# Patient Record
Sex: Female | Born: 1965 | Race: Black or African American | Hispanic: No | Marital: Single | Smoking: Current every day smoker
Health system: Southern US, Community
[De-identification: ages and names within clinical notes are randomized; demographics above are authoritative.]

## PROBLEM LIST (undated history)

## (undated) ENCOUNTER — Inpatient Hospital Stay (HOSPITAL_COMMUNITY): Payer: Self-pay

## (undated) ENCOUNTER — Emergency Department (HOSPITAL_COMMUNITY): Disposition: A | Discharge status: Home / Self Care | Payer: Self-pay

## (undated) DIAGNOSIS — I351 Nonrheumatic aortic (valve) insufficiency: Secondary | ICD-10-CM

## (undated) DIAGNOSIS — M1990 Unspecified osteoarthritis, unspecified site: Secondary | ICD-10-CM

## (undated) DIAGNOSIS — I110 Hypertensive heart disease with heart failure: Secondary | ICD-10-CM

## (undated) DIAGNOSIS — F1010 Alcohol abuse, uncomplicated: Secondary | ICD-10-CM

## (undated) DIAGNOSIS — Z720 Tobacco use: Secondary | ICD-10-CM

## (undated) DIAGNOSIS — I340 Nonrheumatic mitral (valve) insufficiency: Secondary | ICD-10-CM

## (undated) DIAGNOSIS — I429 Cardiomyopathy, unspecified: Secondary | ICD-10-CM

## (undated) DIAGNOSIS — I10 Essential (primary) hypertension: Secondary | ICD-10-CM

## (undated) DIAGNOSIS — J449 Chronic obstructive pulmonary disease, unspecified: Secondary | ICD-10-CM

## (undated) DIAGNOSIS — R0600 Dyspnea, unspecified: Secondary | ICD-10-CM

## (undated) DIAGNOSIS — F1210 Cannabis abuse, uncomplicated: Secondary | ICD-10-CM

## (undated) DIAGNOSIS — M549 Dorsalgia, unspecified: Secondary | ICD-10-CM

## (undated) HISTORY — PX: TUBAL LIGATION: SHX77

## (undated) HISTORY — PX: TONSILLECTOMY: SUR1361

---

## 1999-05-18 ENCOUNTER — Emergency Department (HOSPITAL_COMMUNITY): Admission: EM | Admit: 1999-05-18 | Discharge: 1999-05-18 | Payer: Self-pay | Admitting: *Deleted

## 1999-07-02 ENCOUNTER — Emergency Department (HOSPITAL_COMMUNITY): Admission: EM | Admit: 1999-07-02 | Discharge: 1999-07-02 | Payer: Self-pay | Admitting: Emergency Medicine

## 1999-11-14 ENCOUNTER — Other Ambulatory Visit: Admission: RE | Admit: 1999-11-14 | Discharge: 1999-11-14 | Payer: Self-pay | Admitting: Obstetrics

## 2000-08-22 ENCOUNTER — Emergency Department (HOSPITAL_COMMUNITY): Admission: EM | Admit: 2000-08-22 | Discharge: 2000-08-22 | Payer: Self-pay | Admitting: Emergency Medicine

## 2001-04-23 ENCOUNTER — Encounter: Payer: Self-pay | Admitting: Family Medicine

## 2001-04-23 ENCOUNTER — Encounter: Admission: RE | Admit: 2001-04-23 | Discharge: 2001-04-23 | Payer: Self-pay | Admitting: Family Medicine

## 2001-07-14 HISTORY — PX: EXPLORATORY LAPAROTOMY: SUR591

## 2002-04-25 ENCOUNTER — Encounter: Payer: Self-pay | Admitting: Emergency Medicine

## 2002-04-25 ENCOUNTER — Emergency Department (HOSPITAL_COMMUNITY): Admission: EM | Admit: 2002-04-25 | Discharge: 2002-04-25 | Payer: Self-pay | Admitting: Emergency Medicine

## 2002-07-22 ENCOUNTER — Encounter: Payer: Self-pay | Admitting: Emergency Medicine

## 2002-07-22 ENCOUNTER — Emergency Department (HOSPITAL_COMMUNITY): Admission: EM | Admit: 2002-07-22 | Discharge: 2002-07-22 | Payer: Self-pay | Admitting: Emergency Medicine

## 2003-04-05 ENCOUNTER — Encounter: Admission: RE | Admit: 2003-04-05 | Discharge: 2003-04-05 | Payer: Self-pay | Admitting: Internal Medicine

## 2003-08-11 ENCOUNTER — Encounter: Admission: RE | Admit: 2003-08-11 | Discharge: 2003-08-11 | Payer: Self-pay | Admitting: Sports Medicine

## 2003-08-11 ENCOUNTER — Other Ambulatory Visit: Admission: RE | Admit: 2003-08-11 | Discharge: 2003-08-11 | Payer: Self-pay | Admitting: Family Medicine

## 2003-08-24 ENCOUNTER — Encounter: Admission: RE | Admit: 2003-08-24 | Discharge: 2003-08-24 | Payer: Self-pay | Admitting: Sports Medicine

## 2003-09-07 ENCOUNTER — Encounter: Admission: RE | Admit: 2003-09-07 | Discharge: 2003-09-07 | Payer: Self-pay | Admitting: Family Medicine

## 2004-01-08 ENCOUNTER — Encounter: Admission: RE | Admit: 2004-01-08 | Discharge: 2004-01-08 | Payer: Self-pay | Admitting: Sports Medicine

## 2004-03-11 ENCOUNTER — Encounter: Admission: RE | Admit: 2004-03-11 | Discharge: 2004-03-11 | Payer: Self-pay | Admitting: Family Medicine

## 2004-06-14 ENCOUNTER — Ambulatory Visit: Payer: Self-pay | Admitting: *Deleted

## 2005-03-18 ENCOUNTER — Emergency Department (HOSPITAL_COMMUNITY): Admission: EM | Admit: 2005-03-18 | Discharge: 2005-03-18 | Payer: Self-pay | Admitting: Emergency Medicine

## 2005-07-14 ENCOUNTER — Encounter (INDEPENDENT_AMBULATORY_CARE_PROVIDER_SITE_OTHER): Payer: Self-pay | Admitting: *Deleted

## 2005-07-14 LAB — CONVERTED CEMR LAB

## 2005-07-30 ENCOUNTER — Other Ambulatory Visit: Admission: RE | Admit: 2005-07-30 | Discharge: 2005-07-30 | Payer: Self-pay | Admitting: *Deleted

## 2005-07-30 ENCOUNTER — Ambulatory Visit: Payer: Self-pay | Admitting: Family Medicine

## 2005-07-30 ENCOUNTER — Ambulatory Visit (HOSPITAL_COMMUNITY): Admission: RE | Admit: 2005-07-30 | Discharge: 2005-07-30 | Payer: Self-pay | Admitting: Family Medicine

## 2005-09-22 ENCOUNTER — Ambulatory Visit: Payer: Self-pay | Admitting: Family Medicine

## 2005-09-24 ENCOUNTER — Ambulatory Visit: Payer: Self-pay | Admitting: Family Medicine

## 2005-10-24 ENCOUNTER — Ambulatory Visit: Payer: Self-pay | Admitting: Family Medicine

## 2005-11-12 ENCOUNTER — Ambulatory Visit: Payer: Self-pay | Admitting: Family Medicine

## 2006-09-10 DIAGNOSIS — F17200 Nicotine dependence, unspecified, uncomplicated: Secondary | ICD-10-CM | POA: Insufficient documentation

## 2006-09-10 DIAGNOSIS — K219 Gastro-esophageal reflux disease without esophagitis: Secondary | ICD-10-CM | POA: Insufficient documentation

## 2006-09-11 ENCOUNTER — Encounter (INDEPENDENT_AMBULATORY_CARE_PROVIDER_SITE_OTHER): Payer: Self-pay | Admitting: *Deleted

## 2006-11-02 ENCOUNTER — Ambulatory Visit: Payer: Self-pay | Admitting: Family Medicine

## 2006-11-04 ENCOUNTER — Ambulatory Visit: Payer: Self-pay | Admitting: Sports Medicine

## 2007-10-07 ENCOUNTER — Encounter (INDEPENDENT_AMBULATORY_CARE_PROVIDER_SITE_OTHER): Payer: Self-pay | Admitting: Family Medicine

## 2007-11-12 ENCOUNTER — Ambulatory Visit: Payer: Self-pay | Admitting: Family Medicine

## 2007-11-12 ENCOUNTER — Encounter (INDEPENDENT_AMBULATORY_CARE_PROVIDER_SITE_OTHER): Payer: Self-pay | Admitting: Family Medicine

## 2007-11-12 DIAGNOSIS — N938 Other specified abnormal uterine and vaginal bleeding: Secondary | ICD-10-CM | POA: Insufficient documentation

## 2007-11-12 DIAGNOSIS — N949 Unspecified condition associated with female genital organs and menstrual cycle: Secondary | ICD-10-CM

## 2007-11-12 HISTORY — DX: Other specified abnormal uterine and vaginal bleeding: N93.8

## 2007-11-12 LAB — CONVERTED CEMR LAB: Whiff Test: POSITIVE

## 2007-11-15 ENCOUNTER — Ambulatory Visit: Payer: Self-pay | Admitting: Family Medicine

## 2007-11-15 LAB — CONVERTED CEMR LAB
ALT: 16 units/L (ref 0–35)
AST: 16 units/L (ref 0–37)
BUN: 10 mg/dL (ref 6–23)
CO2: 25 meq/L (ref 19–32)
Calcium: 8.7 mg/dL (ref 8.4–10.5)
Chlamydia, DNA Probe: NEGATIVE
Creatinine, Ser: 0.93 mg/dL (ref 0.40–1.20)
GC Probe Amp, Genital: NEGATIVE
Glucose, Bld: 88 mg/dL (ref 70–99)
HCT: 34.4 % — ABNORMAL LOW (ref 36.0–46.0)
HCV Ab: NEGATIVE
HDL: 44 mg/dL (ref >39)
MCHC: 32.3 g/dL (ref 30.0–36.0)
Platelets: 283 10*3/uL (ref 150–400)
TSH: 1.236 microintl units/mL (ref 0.350–5.50)
Total CHOL/HDL Ratio: 4
Total Protein: 7.3 g/dL (ref 6.0–8.3)

## 2007-11-16 ENCOUNTER — Other Ambulatory Visit: Admission: RE | Admit: 2007-11-16 | Discharge: 2007-11-16 | Payer: Self-pay | Admitting: Family Medicine

## 2007-11-17 ENCOUNTER — Ambulatory Visit (HOSPITAL_COMMUNITY): Admission: RE | Admit: 2007-11-17 | Discharge: 2007-11-17 | Payer: Self-pay | Admitting: Family Medicine

## 2007-11-18 ENCOUNTER — Encounter (INDEPENDENT_AMBULATORY_CARE_PROVIDER_SITE_OTHER): Payer: Self-pay | Admitting: Family Medicine

## 2008-03-17 ENCOUNTER — Telehealth: Payer: Self-pay | Admitting: *Deleted

## 2008-03-23 ENCOUNTER — Ambulatory Visit: Payer: Self-pay | Admitting: Family Medicine

## 2008-03-23 ENCOUNTER — Encounter: Payer: Self-pay | Admitting: Family Medicine

## 2008-03-24 LAB — CONVERTED CEMR LAB: GC Probe Amp, Genital: NEGATIVE

## 2008-05-16 ENCOUNTER — Telehealth: Payer: Self-pay | Admitting: *Deleted

## 2008-05-16 ENCOUNTER — Emergency Department (HOSPITAL_COMMUNITY): Admission: EM | Admit: 2008-05-16 | Discharge: 2008-05-16 | Payer: Self-pay | Admitting: Emergency Medicine

## 2008-05-31 ENCOUNTER — Telehealth: Payer: Self-pay | Admitting: *Deleted

## 2008-06-01 ENCOUNTER — Telehealth (INDEPENDENT_AMBULATORY_CARE_PROVIDER_SITE_OTHER): Payer: Self-pay | Admitting: Family Medicine

## 2008-12-14 ENCOUNTER — Encounter (INDEPENDENT_AMBULATORY_CARE_PROVIDER_SITE_OTHER): Payer: Self-pay | Admitting: Family Medicine

## 2008-12-14 ENCOUNTER — Ambulatory Visit: Payer: Self-pay | Admitting: Family Medicine

## 2008-12-14 DIAGNOSIS — F329 Major depressive disorder, single episode, unspecified: Secondary | ICD-10-CM

## 2008-12-14 DIAGNOSIS — R110 Nausea: Secondary | ICD-10-CM

## 2008-12-14 DIAGNOSIS — N920 Excessive and frequent menstruation with regular cycle: Secondary | ICD-10-CM

## 2008-12-14 DIAGNOSIS — R030 Elevated blood-pressure reading, without diagnosis of hypertension: Secondary | ICD-10-CM | POA: Insufficient documentation

## 2008-12-14 DIAGNOSIS — F3289 Other specified depressive episodes: Secondary | ICD-10-CM | POA: Insufficient documentation

## 2008-12-14 HISTORY — DX: Other specified depressive episodes: F32.89

## 2008-12-14 HISTORY — DX: Major depressive disorder, single episode, unspecified: F32.9

## 2008-12-14 HISTORY — DX: Excessive and frequent menstruation with regular cycle: N92.0

## 2008-12-14 LAB — CONVERTED CEMR LAB: Whiff Test: POSITIVE

## 2008-12-15 LAB — CONVERTED CEMR LAB
Albumin: 4.5 g/dL (ref 3.5–5.2)
BUN: 12 mg/dL (ref 6–23)
CO2: 23 meq/L (ref 19–32)
Creatinine, Ser: 0.85 mg/dL (ref 0.40–1.20)
Glucose, Bld: 94 mg/dL (ref 70–99)
Hemoglobin: 11.9 g/dL — ABNORMAL LOW (ref 12.0–15.0)
MCHC: 33.1 g/dL (ref 30.0–36.0)
Platelets: 270 10*3/uL (ref 150–400)
RBC: 4.25 M/uL (ref 3.87–5.11)
Sodium: 141 meq/L (ref 135–145)
Total Bilirubin: 0.3 mg/dL (ref 0.3–1.2)
Total Protein: 7.5 g/dL (ref 6.0–8.3)

## 2008-12-28 ENCOUNTER — Encounter: Admission: RE | Admit: 2008-12-28 | Discharge: 2008-12-28 | Payer: Self-pay | Admitting: Family Medicine

## 2009-01-03 ENCOUNTER — Encounter: Payer: Self-pay | Admitting: *Deleted

## 2009-01-04 ENCOUNTER — Encounter: Admission: RE | Admit: 2009-01-04 | Discharge: 2009-01-04 | Payer: Self-pay | Admitting: Family Medicine

## 2009-08-07 ENCOUNTER — Ambulatory Visit: Payer: Self-pay | Admitting: Family Medicine

## 2009-08-09 ENCOUNTER — Ambulatory Visit: Payer: Self-pay | Admitting: Family Medicine

## 2009-08-13 ENCOUNTER — Ambulatory Visit (HOSPITAL_COMMUNITY): Admission: RE | Admit: 2009-08-13 | Discharge: 2009-08-13 | Payer: Self-pay | Admitting: Family Medicine

## 2009-08-13 ENCOUNTER — Ambulatory Visit: Payer: Self-pay | Admitting: Family Medicine

## 2009-08-13 DIAGNOSIS — R079 Chest pain, unspecified: Secondary | ICD-10-CM | POA: Insufficient documentation

## 2009-08-17 ENCOUNTER — Encounter: Payer: Self-pay | Admitting: Family Medicine

## 2009-08-25 ENCOUNTER — Emergency Department (HOSPITAL_COMMUNITY): Admission: EM | Admit: 2009-08-25 | Discharge: 2009-08-25 | Payer: Self-pay | Admitting: Emergency Medicine

## 2009-08-28 ENCOUNTER — Telehealth: Payer: Self-pay | Admitting: Family Medicine

## 2009-09-10 ENCOUNTER — Ambulatory Visit: Payer: Self-pay | Admitting: Family Medicine

## 2009-09-10 ENCOUNTER — Telehealth: Payer: Self-pay | Admitting: Family Medicine

## 2009-09-10 DIAGNOSIS — N898 Other specified noninflammatory disorders of vagina: Secondary | ICD-10-CM | POA: Insufficient documentation

## 2009-09-27 ENCOUNTER — Encounter: Admission: RE | Admit: 2009-09-27 | Discharge: 2009-09-27 | Payer: Self-pay | Admitting: Chiropractic Medicine

## 2009-10-26 ENCOUNTER — Ambulatory Visit (HOSPITAL_COMMUNITY): Admission: RE | Admit: 2009-10-26 | Discharge: 2009-10-26 | Payer: Self-pay | Admitting: Chiropractic Medicine

## 2010-08-15 NOTE — Assessment & Plan Note (Signed)
Summary: FU&pap/KH   Vital Signs:  Patient profile:   45 year old female Weight:      170.8 pounds Temp:     98.5 degrees F oral Pulse rate:   82 / minute Pulse rhythm:   regular BP sitting:   125 / 83  (right arm)  Vitals Entered By: Loralee Pacas CMA (September 10, 2009 9:43 AM)  Primary Care Provider:  Magnus Ivan MD  CC:  vaginal discharge.  History of Present Illness: vaginal discharge Character: tan, creamy, odor (different than normal smell) Onset about 2.5 weeks nothing makes better nothing makes symptoms worse   Allergies (verified): 1)  ! Tramadol Hcl  Physical Exam  General:  NAD well-developed and well-nourished.   vital signs noted and wnl Genitalia:  white vaginal dischargenormal introitus, no external lesions, mucosa pink and moist, and no vaginal or cervix erythematous near os.normal introitus, no external lesions, mucosa pink and moist, no vaginal or cervical lesions, no vaginal atrophy, and no adnexal masses or tenderness.   Psych:  memory intact for recent and remote, normally interactive, good eye contact, not anxious appearing, and not depressed appearing.     Impression & Recommendations:  Problem # 1:  VAGINAL DISCHARGE (ICD-623.5) Assessment New Patient has vaginal discharge that she has noticed for the last couple of weeks.  On wet prep noticed Trichomonads.  Will write a prescription for metronidazole.  Also, on next visit will follow up with an attending for visualization of erythema near cervical os. Orders: Wet Prep- [facility] ([address]) [facility]- Est Level  3 ([address])  Complete Medication List: 1)  Omeprazole 20 Mg Cpdr (Omeprazole) .... Take one pill once a day 2)  Tramadol Hcl 50 Mg Tabs (Tramadol hcl) .... Take one tablet every 6 hours as needed for pain 3)  Ibuprofen 800 Mg Tabs (Ibuprofen) .... Take 1 tab every 6 hours as needed for pain  Patient Instructions: 1)  [name], thank you for your patience with me today!  I will look at the  results of your wet prep and give you a call with the results. 2)  Please call back and reschedule a full physical exam at your earliest convience! 3)  Thank you and be blessed!  Laboratory Results  Date/Time Received: September 10, 2009 10:21 AM  Date/Time Reported: September 10, 2009 10:33 AM   [employer] Source: vaginal WBC/hpf: 1-5 Bacteria/hpf: 3+ Clue cells/hpf: none Yeast/hpf: none Trichomonas/hpf: moderate Comments: ...........test performed by...........Marland KitchenTerese Door, CMA

## 2010-08-15 NOTE — Assessment & Plan Note (Signed)
Summary: KNOTT ON WRIST/KH   Vital Signs:  Patient profile:   45 year old female Weight:      172 pounds Temp:     98.1 degrees F oral Pulse rate:   84 / minute Pulse rhythm:   regular BP sitting:   136 / 84  (left arm)  Vitals Entered By: Loralee Pacas CMA (August 13, 2009 2:49 PM) CC: chest pain/knot on wrist Comments pt complaining of chest pains    Primary Care Provider:  Drue Dun MD  CC:  chest pain/knot on wrist.  History of Present Illness: chest pain Character: something pulling Location: substernal Onset: 6 months constant better:  baking soda and water worse:  nothing, pt claims she doesn't exert herself any no radiation hurts worse when lying down, spicy food, and pt has to eat food in small portions because she feels as if she might throw up if she eats large portion  wrist Character: KNOT, burning Location: L wrist Onset: years better:  naproxen worse:  writing, squeezing washcloth pain radiates to elbow    Current Medications (verified): 1)  Omeprazole 20 Mg Cpdr (Omeprazole) .... Take One Pill Once A Day 2)  Tramadol Hcl 50 Mg Tabs (Tramadol Hcl) .... Take One Tablet Every 6 Hours As Needed For Pain  Allergies: No Known Drug Allergies  Review of Systems       as noted in HPI  Physical Exam  General:  NAD vital signs reviewed and wnlwell-developed and well-nourished.   Chest Wall:  no tenderness.   Heart:  II/VI systolic murmr heard best at RUSB Abdomen:  + BS, no tenderness to palpation  Psych:  patient tearful at times due to life stressors:  son in prison, in a relationship with a married man   Wrist/Hand Exam  Palpation:    pt has a palpable moveable knot on L wrist  Sensory:    pt felt burning with grip on the L  Motor:    5-/5 hand grip on L 5/5 hand grip on R   Impression & Recommendations:  Problem # 1:  CHEST PAIN UNSPECIFIED (ICD-786.50) Assessment New patient with new problem of chest pain.  could be MI,  gastric reflux, chostrochondritis.  hpi, physical exam and normal ekg and pmhx make me think that patient might have worsening gastric reflux.   will follow up on patient's symptoms in 2-3 weeks. Orders: EKG- [facility] (EKG) [facility]- Est  Level 4 ([address])  Problem # 2:  ? of ARTHRITIS (ICD-716.90) Assessment: Unchanged  This seems like a chronic problem.  Past medical history suggests a possibility of arthritis.  Gave pt a script for tramadol.  Will follow up on pain at next visit because if this is not arthritis might need to consider x-rays.  Orders: [facility]- Est  Level 4 ([address])  Complete Medication List: 1)  Omeprazole 20 Mg Cpdr (Omeprazole) .... Take one pill once a day 2)  Tramadol Hcl 50 Mg Tabs (Tramadol hcl) .... Take one tablet every 6 hours as needed for pain  Patient Instructions: 1)  Ms. Egnor, I think you are experiencing gastric reflux.  I am going to send in a prescription for OMEPRAZOLE. 2)  I will also send in a prescription for TRAMADOL for you pain. 3)  Next time we will talk about ways to help your mood. 4)  Come see me in the next 2-3 weeks. 5)  Thank you and be blessed! Prescriptions: TRAMADOL HCL 50 MG TABS (TRAMADOL HCL) take one  tablet every 6 hours as needed for pain  #60 x 0   Entered and Authorized by:   Magnus Ivan MD   Signed by:   Magnus Ivan MD on 08/13/2009   Method used:   Electronically to        [facility] [address] #[id number]* (retail)       [address] [place], [place]  [address]       Ph: [phone]       Fax: [phone]   RxID:   [id number] OMEPRAZOLE 20 MG CPDR (OMEPRAZOLE) take one pill once a day  #30 x 2   Entered and Authorized by:   Magnus Ivan MD   Signed by:   Magnus Ivan MD on 08/13/2009   Method used:   Electronically to        [facility] [address] #[id number]* (retail)       [address] [place], [place]  [address]       Ph: [phone]       Fax: [phone]   RxID:   [id number]

## 2010-08-15 NOTE — Progress Notes (Signed)
  Medications Added MOBIC 15 MG  TABS (MELOXICAM) 1 tablet by mouth daily as needed for arthritis pain - take with food MOBIC 15 MG  TABS (MELOXICAM) 1 tablet by mouth daily as needed for arthritis pain - take with food RANITIDINE HCL 150 MG  TABS (RANITIDINE HCL) 1 tablet by mouth two times a day as needed for heartburn RANITIDINE HCL 150 MG  TABS (RANITIDINE HCL) 1 tablet by mouth two times a day as needed for heartburn METRONIDAZOLE 500 MG  TABS (METRONIDAZOLE) 1 tablet by mouth two times a day x 7 days - DO NOT DRINK ALCOHOL WITHIN 48 hrs of taking METRONIDAZOLE 500 MG TABS (METRONIDAZOLE) 1 tab by mouth two times a day x 7 days METRONIDAZOLE 500 MG TABS (METRONIDAZOLE) 1 tab by mouth two times a day x 7 days CITALOPRAM HYDROBROMIDE 40 MG TABS (CITALOPRAM HYDROBROMIDE) 1/2 tablet by mouth daily x 1 week, then 1 tablet daily until your next appointment CITALOPRAM HYDROBROMIDE 40 MG TABS (CITALOPRAM HYDROBROMIDE) 1/2 tablet by mouth daily x 1 week, then 1 tablet daily until your next appointment METRONIDAZOLE 500 MG TABS (METRONIDAZOLE) 1 tablet by mouth two times a day x 7 days - do not drink alcohol while taking OMEPRAZOLE 20 MG CPDR (OMEPRAZOLE) take one pill once a day TRAMADOL HCL 50 MG TABS (TRAMADOL HCL) take one tablet every 6 hours as needed for pain IBUPROFEN 800 MG TABS (IBUPROFEN) Take 1 tab every 6 hours as needed for pain METRONIDAZOLE 250 MG TABS (METRONIDAZOLE) take every 8 hours for 7 days            New/Updated Medications: METRONIDAZOLE 250 MG TABS (METRONIDAZOLE) take every 8 hours for 7 days Prescriptions: METRONIDAZOLE 250 MG TABS (METRONIDAZOLE) take every 8 hours for 7 days  #21 x 0   Entered and Authorized by:   Magnus Ivan MD   Signed by:   Magnus Ivan MD on 09/10/2009   Method used:   Electronically to        [facility] [address] #[id number]* (retail)       [address] [place], [place]  [address]       Ph: [phone]       Fax:  [phone]   RxID:   [id number]  Called patient and informed her that her wet prep showed that she Trichomonas and that her partner needed to be treated as well.

## 2010-08-15 NOTE — Assessment & Plan Note (Signed)
Summary: READ PPD/KH   Nurse Visit   Allergies: No Known Drug Allergies  PPD Results    Date of reading: 08/09/2009    Results: o mm    Interpretation: negative

## 2010-08-15 NOTE — Assessment & Plan Note (Signed)
Summary: TB TEST/KH   Nurse Visit   Allergies: No Known Drug Allergies  Immunizations Administered:  PPD Skin Test:    Vaccine Type: PPD    Site: right forearm    Mfr: Sanofi Pasteur    Dose: 0.1 ml    Route: ID    Given by: Theresia Lo RN    Exp. Date: 11/07/2011    Lot #: [id number]  Orders Added: 1)  TB Skin Test [86580] 2)  Admin 1st Vaccine [phone]

## 2010-08-15 NOTE — Progress Notes (Signed)
Summary: Rx Prob  Medications Added IBUPROFEN 800 MG TABS (IBUPROFEN) Take 1 tab every 6 hours as needed for pain       Phone Note Call from Patient Call back at Home Phone [phone]   Caller: Patient Summary of Call: Pt was given something for pain not sure what, but it made her itch.  Wondering if something else can be called in for her? Initial call taken by: Clydell Hakim,  August 28, 2009 3:21 PM  Follow-up for Phone Call        will send message to Dr. Gwendolyn Grant in Dr. Mayford Knife absence. Follow-up by: Theresia Lo RN,  August 28, 2009 3:29 PM  Additional Follow-up for Phone Call Additional follow up Details #1::        I think we should try Ibuprofen 800 mg.  Looked back through her records and could find no history of bleed, kidney problems, or allergies.  I'm on vacation starting tomorrow, Dr. Edmonia James will cover for me while I'm gone.  I'm not sure who Dr. Mayford Knife has covering her box Thursday and Friday of this week as I'm gone.   Additional Follow-up by: Renold Don MD,  August 29, 2009 8:18 PM    New/Updated Medications: IBUPROFEN 800 MG TABS (IBUPROFEN) Take 1 tab every 6 hours as needed for pain Prescriptions: IBUPROFEN 800 MG TABS (IBUPROFEN) Take 1 tab every 6 hours as needed for pain  #45 x 0   Entered and Authorized by:   Renold Don MD   Signed by:   Renold Don MD on 08/29/2009   Method used:   Electronically to        [facility]  [address] [phone]* (retail)       [address]       [place], [place]  [id number]       Ph: [phone] or [phone]       Fax: [phone]   RxID:   [id number]

## 2010-08-23 ENCOUNTER — Encounter: Payer: Self-pay | Admitting: *Deleted

## 2010-09-14 ENCOUNTER — Emergency Department (HOSPITAL_BASED_OUTPATIENT_CLINIC_OR_DEPARTMENT_OTHER)
Admission: EM | Admit: 2010-09-14 | Discharge: 2010-09-14 | Disposition: A | Discharge status: Home / Self Care | Payer: Self-pay | Attending: Emergency Medicine | Admitting: Emergency Medicine

## 2010-09-14 ENCOUNTER — Emergency Department (INDEPENDENT_AMBULATORY_CARE_PROVIDER_SITE_OTHER): Payer: Self-pay

## 2010-09-14 DIAGNOSIS — K2970 Gastritis, unspecified, without bleeding: Secondary | ICD-10-CM | POA: Insufficient documentation

## 2010-09-14 DIAGNOSIS — K2990 Gastroduodenitis, unspecified, without bleeding: Secondary | ICD-10-CM | POA: Insufficient documentation

## 2010-09-14 DIAGNOSIS — R509 Fever, unspecified: Secondary | ICD-10-CM | POA: Insufficient documentation

## 2010-09-14 DIAGNOSIS — R109 Unspecified abdominal pain: Secondary | ICD-10-CM

## 2010-09-14 DIAGNOSIS — I10 Essential (primary) hypertension: Secondary | ICD-10-CM | POA: Insufficient documentation

## 2010-09-14 DIAGNOSIS — E876 Hypokalemia: Secondary | ICD-10-CM | POA: Insufficient documentation

## 2010-09-14 DIAGNOSIS — R10816 Epigastric abdominal tenderness: Secondary | ICD-10-CM | POA: Insufficient documentation

## 2010-09-14 DIAGNOSIS — R1013 Epigastric pain: Secondary | ICD-10-CM | POA: Insufficient documentation

## 2010-09-14 DIAGNOSIS — R7309 Other abnormal glucose: Secondary | ICD-10-CM | POA: Insufficient documentation

## 2010-09-14 DIAGNOSIS — F1010 Alcohol abuse, uncomplicated: Secondary | ICD-10-CM | POA: Insufficient documentation

## 2010-09-14 LAB — COMPREHENSIVE METABOLIC PANEL
AST: 23 U/L (ref 0–37)
Alkaline Phosphatase: 78 U/L (ref 39–117)
Calcium: 8.8 mg/dL (ref 8.4–10.5)
Chloride: 107 mEq/L (ref 96–112)
Creatinine, Ser: 0.8 mg/dL (ref 0.4–1.2)
GFR calc Af Amer: >60 mL/min (ref >60)
Potassium: 3.3 mEq/L — ABNORMAL LOW (ref 3.5–5.1)
Total Bilirubin: 0.5 mg/dL (ref 0.3–1.2)
Total Protein: 8.3 g/dL (ref 6.0–8.3)

## 2010-09-14 LAB — URINALYSIS, ROUTINE W REFLEX MICROSCOPIC
Bilirubin Urine: NEGATIVE
Glucose, UA: 100 mg/dL — AB
Hgb urine dipstick: NEGATIVE
Nitrite: NEGATIVE
Specific Gravity, Urine: 1.019 (ref 1.005–1.030)
Urobilinogen, UA: 0.2 mg/dL (ref 0.0–1.0)

## 2010-09-14 LAB — LIPASE, BLOOD: Lipase: 83 U/L (ref 23–300)

## 2010-09-14 LAB — CBC
HCT: 35.9 % — ABNORMAL LOW (ref 36.0–46.0)
Hemoglobin: 12.4 g/dL (ref 12.0–15.0)
MCH: 27.9 pg (ref 26.0–34.0)
MCHC: 34.5 g/dL (ref 30.0–36.0)
RBC: 4.44 MIL/uL (ref 3.87–5.11)
WBC: 5.9 10*3/uL (ref 4.0–10.5)

## 2010-09-14 LAB — URINE MICROSCOPIC-ADD ON

## 2010-09-14 LAB — PREGNANCY, URINE: Preg Test, Ur: NEGATIVE

## 2010-09-16 ENCOUNTER — Emergency Department (HOSPITAL_COMMUNITY)
Admission: EM | Admit: 2010-09-16 | Discharge: 2010-09-16 | Disposition: A | Discharge status: Home / Self Care | Payer: Self-pay | Attending: Emergency Medicine | Admitting: Emergency Medicine

## 2010-09-16 DIAGNOSIS — K2970 Gastritis, unspecified, without bleeding: Secondary | ICD-10-CM | POA: Insufficient documentation

## 2010-09-16 DIAGNOSIS — R109 Unspecified abdominal pain: Secondary | ICD-10-CM | POA: Insufficient documentation

## 2010-09-16 LAB — CBC
MCHC: 31.5 g/dL (ref 30.0–36.0)
MCV: 84.9 fL (ref 78.0–100.0)
Platelets: 249 10*3/uL (ref 150–400)
RDW: 14.3 % (ref 11.5–15.5)
WBC: 8.3 10*3/uL (ref 4.0–10.5)

## 2010-09-16 LAB — DIFFERENTIAL
Basophils Relative: 0 % (ref 0–1)
Eosinophils Absolute: 0 10*3/uL (ref 0.0–0.7)
Eosinophils Relative: 1 % (ref 0–5)
Monocytes Relative: 3 % (ref 3–12)

## 2010-09-16 LAB — COMPREHENSIVE METABOLIC PANEL
BUN: 11 mg/dL (ref 6–23)
CO2: 24 mEq/L (ref 19–32)
Calcium: 8.6 mg/dL (ref 8.4–10.5)
Creatinine, Ser: 1 mg/dL (ref 0.4–1.2)
GFR calc non Af Amer: >60 mL/min (ref >60)
Glucose, Bld: 127 mg/dL — ABNORMAL HIGH (ref 70–99)

## 2010-09-16 LAB — ETHANOL: Alcohol, Ethyl (B): <5 mg/dL (ref 0–10)

## 2011-04-15 LAB — URINALYSIS, ROUTINE W REFLEX MICROSCOPIC
Bilirubin Urine: NEGATIVE
Glucose, UA: NEGATIVE
Ketones, ur: NEGATIVE
Leukocytes, UA: NEGATIVE
Nitrite: NEGATIVE
Protein, ur: NEGATIVE
Specific Gravity, Urine: 1.025
Urobilinogen, UA: 0.2
pH: 6

## 2011-04-15 LAB — STOOL CULTURE

## 2011-04-15 LAB — URINE MICROSCOPIC-ADD ON

## 2011-04-15 LAB — CLOSTRIDIUM DIFFICILE EIA: C difficile Toxins A+B, EIA: NEGATIVE

## 2011-04-15 LAB — OVA AND PARASITE EXAMINATION

## 2011-04-15 LAB — POCT PREGNANCY, URINE: Preg Test, Ur: NEGATIVE

## 2011-04-15 LAB — OCCULT BLOOD X 1 CARD TO LAB, STOOL: Fecal Occult Bld: POSITIVE

## 2011-12-28 ENCOUNTER — Emergency Department (HOSPITAL_COMMUNITY)
Admission: EM | Admit: 2011-12-28 | Discharge: 2011-12-28 | Disposition: A | Discharge status: Home / Self Care | Payer: Self-pay | Attending: Emergency Medicine | Admitting: Emergency Medicine

## 2011-12-28 ENCOUNTER — Encounter (HOSPITAL_COMMUNITY): Payer: Self-pay | Admitting: Emergency Medicine

## 2011-12-28 DIAGNOSIS — R109 Unspecified abdominal pain: Secondary | ICD-10-CM | POA: Insufficient documentation

## 2011-12-28 DIAGNOSIS — R112 Nausea with vomiting, unspecified: Secondary | ICD-10-CM | POA: Insufficient documentation

## 2011-12-28 DIAGNOSIS — R197 Diarrhea, unspecified: Secondary | ICD-10-CM | POA: Insufficient documentation

## 2011-12-28 LAB — CBC
Hemoglobin: 12.7 g/dL (ref 12.0–15.0)
Platelets: 189 10*3/uL (ref 150–400)
RBC: 4.55 MIL/uL (ref 3.87–5.11)
WBC: 9.4 10*3/uL (ref 4.0–10.5)

## 2011-12-28 LAB — COMPREHENSIVE METABOLIC PANEL
ALT: 11 U/L (ref 0–35)
Alkaline Phosphatase: 61 U/L (ref 39–117)
BUN: 8 mg/dL (ref 6–23)
Chloride: 100 mEq/L (ref 96–112)
GFR calc Af Amer: >90 mL/min (ref >90)
Glucose, Bld: 143 mg/dL — ABNORMAL HIGH (ref 70–99)
Potassium: 3.4 mEq/L — ABNORMAL LOW (ref 3.5–5.1)
Sodium: 136 mEq/L (ref 135–145)
Total Bilirubin: 0.3 mg/dL (ref 0.3–1.2)
Total Protein: 7.7 g/dL (ref 6.0–8.3)

## 2011-12-28 LAB — DIFFERENTIAL
Eosinophils Absolute: 0.1 10*3/uL (ref 0.0–0.7)
Lymphocytes Relative: 20 % (ref 12–46)
Lymphs Abs: 1.8 10*3/uL (ref 0.7–4.0)
Monocytes Relative: 3 % (ref 3–12)
Neutro Abs: 7.2 10*3/uL (ref 1.7–7.7)
Neutrophils Relative %: 77 % (ref 43–77)

## 2011-12-28 LAB — URINALYSIS, ROUTINE W REFLEX MICROSCOPIC
Bilirubin Urine: NEGATIVE
Glucose, UA: NEGATIVE mg/dL
Hgb urine dipstick: NEGATIVE
Protein, ur: NEGATIVE mg/dL
Specific Gravity, Urine: 1.014 (ref 1.005–1.030)

## 2011-12-28 LAB — LACTIC ACID, PLASMA: Lactic Acid, Venous: 2.7 mmol/L — ABNORMAL HIGH (ref 0.5–2.2)

## 2011-12-28 LAB — LIPASE, BLOOD: Lipase: 33 U/L (ref 11–59)

## 2011-12-28 MED ORDER — FAMOTIDINE 20 MG PO TABS: 20.0000 mg | ORAL_TABLET | Freq: Two times a day (BID) | ORAL | Status: DC

## 2011-12-28 MED ORDER — LOPERAMIDE HCL 2 MG PO CAPS: 2.0000 mg | ORAL_CAPSULE | Freq: Four times a day (QID) | ORAL | Status: AC | PRN

## 2011-12-28 MED ORDER — PROMETHAZINE HCL 25 MG/ML IJ SOLN
25.0000 mg | Freq: Once | INTRAMUSCULAR | Status: AC
  Administered 2011-12-28: 25 mg via INTRAVENOUS
  Filled 2011-12-28: qty 1

## 2011-12-28 MED ORDER — LORAZEPAM 2 MG/ML IJ SOLN
1.0000 mg | Freq: Once | INTRAMUSCULAR | Status: AC
  Administered 2011-12-28: 1 mg via INTRAVENOUS
  Filled 2011-12-28: qty 1

## 2011-12-28 MED ORDER — ONDANSETRON HCL 4 MG/2ML IJ SOLN
4.0000 mg | Freq: Once | INTRAMUSCULAR | Status: AC
  Administered 2011-12-28: 4 mg via INTRAVENOUS
  Filled 2011-12-28: qty 2

## 2011-12-28 MED ORDER — HYDROMORPHONE HCL PF 1 MG/ML IJ SOLN
1.0000 mg | Freq: Once | INTRAMUSCULAR | Status: AC
  Administered 2011-12-28: 1 mg via INTRAVENOUS
  Filled 2011-12-28: qty 1

## 2011-12-28 MED ORDER — ONDANSETRON HCL 4 MG/2ML IJ SOLN
INTRAMUSCULAR | Status: AC
  Filled 2011-12-28: qty 2

## 2011-12-28 MED ORDER — ONDANSETRON HCL 4 MG PO TABS: 4.0000 mg | ORAL_TABLET | Freq: Three times a day (TID) | ORAL | Status: DC | PRN

## 2011-12-28 MED ORDER — GI COCKTAIL ~~LOC~~
30.0000 mL | Freq: Once | ORAL | Status: AC
  Administered 2011-12-28: 30 mL via ORAL
  Filled 2011-12-28: qty 30

## 2011-12-28 MED ORDER — SODIUM CHLORIDE 0.9 % IV BOLUS (SEPSIS)
1000.0000 mL | Freq: Once | INTRAVENOUS | Status: AC
  Administered 2011-12-28: 1000 mL via INTRAVENOUS

## 2011-12-28 MED ORDER — FAMOTIDINE IN NACL 20-0.9 MG/50ML-% IV SOLN
20.0000 mg | Freq: Once | INTRAVENOUS | Status: AC
  Administered 2011-12-28: 20 mg via INTRAVENOUS
  Filled 2011-12-28: qty 50

## 2011-12-28 MED ORDER — PROMETHAZINE HCL 25 MG PO TABS: 25.0000 mg | ORAL_TABLET | Freq: Four times a day (QID) | ORAL | Status: DC | PRN

## 2011-12-28 NOTE — ED Notes (Signed)
Pt tolerated fluids well. Denies nausea at this time 

## 2011-12-28 NOTE — ED Notes (Signed)
PER EMS- Pt started having abdominal pain about an 1.5 hour ago. States she did have two beers tonight. No abdominal distension. Denies history. Alertx4. NAD. Received 4 mg Zofran. BP 160/98, P 88  O2 98% RA. Lungs Clear.

## 2011-12-28 NOTE — Discharge Instructions (Signed)
Stick to a bland diet.  Return to the ER for persistent pain, vomiting, fever, or other concerning symptoms.  Take nausea medication as prescribed.  Avoid alcohol and spicy foods.  Abdominal Pain Abdominal pain can be caused by many things. Your caregiver decides the seriousness of your pain by an examination and possibly blood tests and X-rays. Many cases can be observed and treated at home. Most abdominal pain is not caused by a disease and will probably improve without treatment. However, in many cases, more time must pass before a clear cause of the pain can be found. Before that point, it may not be known if you need more testing, or if hospitalization or surgery is needed. HOME CARE INSTRUCTIONS   Do not take laxatives unless directed by your caregiver.   Take pain medicine only as directed by your caregiver.   Only take over-the-counter or prescription medicines for pain, discomfort, or fever as directed by your caregiver.   Try a clear liquid diet (broth, tea, or water) for as long as directed by your caregiver. Slowly move to a bland diet as tolerated.  SEEK IMMEDIATE MEDICAL CARE IF:   The pain does not go away.   You have a fever.   You keep throwing up (vomiting).   The pain is felt only in portions of the abdomen. Pain in the right side could possibly be appendicitis. In an adult, pain in the left lower portion of the abdomen could be colitis or diverticulitis.   You pass bloody or black tarry stools.  MAKE SURE YOU:   Understand these instructions.   Will watch your condition.   Will get help right away if you are not doing well or get worse.  Document Released: 04/09/2005 Document Revised: 06/19/2011 Document Reviewed: 02/16/2008 [facility] Patient Information 2012 [place], [place].  B.R.A.T. Diet Your doctor has recommended the B.R.A.T. diet for you or your child until the condition improves. This is often used to help control diarrhea and vomiting symptoms. If you or  your child can tolerate clear liquids, you may have:  Bananas.   Rice.   Applesauce.   Toast (and other simple starches such as crackers, potatoes, noodles).  Be sure to avoid dairy products, meats, and fatty foods until symptoms are better. Fruit juices such as apple, grape, and prune juice can make diarrhea worse. Avoid these. Continue this diet for 2 days or as instructed by your caregiver. Document Released: 06/30/2005 Document Revised: 06/19/2011 Document Reviewed: 12/17/2006 [facility] Patient Information 2012 [place], [place].

## 2011-12-28 NOTE — ED Notes (Signed)
Pt sleeping.  Respirations even and unlabored.

## 2011-12-28 NOTE — ED Provider Notes (Addendum)
History     CSN: [id number]  Arrival date & time 12/28/11  0112   First MD Initiated Contact with Patient 12/28/11 0122      Chief Complaint  Patient presents with  . Abdominal Pain    (Consider location/radiation/quality/duration/timing/severity/associated sxs/prior treatment) HPI 46 year old female presents to emergency department with complaint of pain starting about 2 hours ago. Patient reports diffuse pain in abdomen back chest arms legs. Patient has had nausea and vomiting and diarrhea. She denies any known unusual foods or sick contacts. No prior history of similar symptoms. Patient received Zofran in route from EMS. Patient reports having to 24 ounce beers tonight. Patient is agitated and is a poor historian. History reviewed. No pertinent past medical history.  History reviewed. No pertinent past surgical history.  No family history on file.  History  Substance Use Topics  . Smoking status: Not on file  . Smokeless tobacco: Not on file  . Alcohol Use: Not on file    OB History    Grav Para Term Preterm Abortions TAB SAB Ect Mult Living                  Review of Systems  Unable to perform ROS: Other   patient refuses to answer questions  Allergies  Review of patient's allergies indicates no known allergies.  Home Medications   Current Outpatient Rx  Name Route Sig Dispense Refill  . BC HEADACHE POWDER PO Oral Take 1 Package by mouth every 8 (eight) hours as needed. For headache or pain      BP 128/79  Pulse 71  Temp 97.7 F (36.5 C) (Oral)  Resp 20  SpO2 97%  Physical Exam  Nursing note and vitals reviewed. Constitutional: She is oriented to person, place, and time. She appears well-developed and well-nourished. She appears distressed (agitated in pain).  HENT:  Head: Normocephalic and atraumatic.  Nose: Nose normal.  Mouth/Throat: Oropharynx is clear and moist.  Eyes: Conjunctivae and EOM are normal. Pupils are equal, round, and reactive to  light.  Neck: Normal range of motion. Neck supple. No JVD present. No tracheal deviation present. No thyromegaly present.  Cardiovascular: Normal rate, regular rhythm, normal heart sounds and intact distal pulses.  Exam reveals no gallop and no friction rub.   No murmur heard. Pulmonary/Chest: Effort normal and breath sounds normal. No stridor. No respiratory distress. She has no wheezes. She has no rales. She exhibits no tenderness.  Abdominal: Soft. Bowel sounds are normal. She exhibits no distension and no mass. There is tenderness (periumbilical abdominal pain mild in nature). There is no rebound and no guarding.       Decreased bowel sounds  Musculoskeletal: Normal range of motion. She exhibits no edema and no tenderness.  Lymphadenopathy:    She has no cervical adenopathy.  Neurological: She is oriented to person, place, and time. She exhibits normal muscle tone. Coordination normal.  Skin: Skin is dry. No rash noted. No erythema. No pallor.  Psychiatric: She has a normal mood and affect. Her behavior is normal. Judgment and thought content normal.    ED Course  Procedures (including critical care time)  Labs Reviewed  COMPREHENSIVE METABOLIC PANEL - Abnormal; Notable for the following:    Potassium 3.4 (*)     Glucose, Bld 143 (*)     All other components within normal limits  URINALYSIS, ROUTINE W REFLEX MICROSCOPIC - Abnormal; Notable for the following:    APPearance HAZY (*)     Ketones,  ur 15 (*)     All other components within normal limits  LACTIC ACID, PLASMA - Abnormal; Notable for the following:    Lactic Acid, Venous 2.7 (*)     All other components within normal limits  CBC  DIFFERENTIAL  LIPASE, BLOOD  PREGNANCY, URINE   No results found.   No diagnosis found.    MDM  46 year old female with abdominal pain, nausea vomiting and diarrhea. Her behavior seems out of character with her physical exam, showing a soft abdomen and only mild tenderness on  palpation. Lactate is slightly elevated at 2.7 but not significantly so to indicate mesenteric ischemia. Patient's pain and vomiting improved after Dilaudid and Zofran. Patient reexamined, no pain at this time. Labs reassuring. Will have patient return for 24-hour recheck if she has persistent pain. Do not feel patient has gynecological or urological source of her pain      4:49 AM Pt has tolerated po challenge.  Will d/c home.  Olivia Mackie, MD 12/28/11 0449  6:54 AM Pt with return of pain once getting up, nauseated without vomiting, additional episode of diarrhea. Suspect gastroenteritis.  Pt feeling better after phenergan, ativan, gi cocktail.  Will d/c home with phenergan and imodium.  Olivia Mackie, MD 12/28/11 [phone]

## 2011-12-28 NOTE — ED Notes (Signed)
Pt refusing to leave. Will not speak to RN, uses blankets to cover head.

## 2011-12-28 NOTE — ED Notes (Signed)
NAD noted at time of d/c home. Pt verbalized understanding of d/c inst. 

## 2011-12-28 NOTE — ED Notes (Signed)
Attempted to discharge pt. Pt becomes very tearful stating her stomach hurts again and she doesn't feel well. MD aware and at bedside to eval

## 2011-12-31 ENCOUNTER — Inpatient Hospital Stay (HOSPITAL_COMMUNITY)
Admission: AD | Admit: 2011-12-31 | Discharge: 2011-12-31 | Disposition: A | Discharge status: Home / Self Care | Payer: Self-pay | Source: Ambulatory Visit | Attending: Obstetrics & Gynecology | Admitting: Obstetrics & Gynecology

## 2011-12-31 ENCOUNTER — Encounter (HOSPITAL_COMMUNITY): Payer: Self-pay

## 2011-12-31 DIAGNOSIS — R110 Nausea: Secondary | ICD-10-CM | POA: Insufficient documentation

## 2011-12-31 DIAGNOSIS — R109 Unspecified abdominal pain: Secondary | ICD-10-CM | POA: Insufficient documentation

## 2011-12-31 DIAGNOSIS — R197 Diarrhea, unspecified: Secondary | ICD-10-CM | POA: Insufficient documentation

## 2011-12-31 DIAGNOSIS — R030 Elevated blood-pressure reading, without diagnosis of hypertension: Secondary | ICD-10-CM | POA: Insufficient documentation

## 2011-12-31 DIAGNOSIS — R112 Nausea with vomiting, unspecified: Secondary | ICD-10-CM

## 2011-12-31 LAB — DIFFERENTIAL
Basophils Absolute: 0 10*3/uL (ref 0.0–0.1)
Basophils Relative: 0 % (ref 0–1)
Eosinophils Relative: 0 % (ref 0–5)
Monocytes Absolute: 1 10*3/uL (ref 0.1–1.0)
Monocytes Relative: 9 % (ref 3–12)

## 2011-12-31 LAB — RAPID URINE DRUG SCREEN, HOSP PERFORMED
Amphetamines: NOT DETECTED
Benzodiazepines: NOT DETECTED
Opiates: NOT DETECTED

## 2011-12-31 LAB — CBC
HCT: 46 % (ref 36.0–46.0)
MCH: 28.2 pg (ref 26.0–34.0)
MCHC: 33.9 g/dL (ref 30.0–36.0)
MCV: 83.2 fL (ref 78.0–100.0)
RDW: 14.6 % (ref 11.5–15.5)

## 2011-12-31 LAB — COMPREHENSIVE METABOLIC PANEL
AST: 13 U/L (ref 0–37)
Albumin: 4.5 g/dL (ref 3.5–5.2)
BUN: 10 mg/dL (ref 6–23)
Calcium: 10 mg/dL (ref 8.4–10.5)
Creatinine, Ser: 0.82 mg/dL (ref 0.50–1.10)
GFR calc non Af Amer: 86 mL/min — ABNORMAL LOW (ref >90)

## 2011-12-31 LAB — URINALYSIS, ROUTINE W REFLEX MICROSCOPIC
Glucose, UA: NEGATIVE mg/dL
Leukocytes, UA: NEGATIVE
Nitrite: NEGATIVE
Protein, ur: NEGATIVE mg/dL
Urobilinogen, UA: 0.2 mg/dL (ref 0.0–1.0)

## 2011-12-31 LAB — AMYLASE: Amylase: 54 U/L (ref 0–105)

## 2011-12-31 LAB — POCT PREGNANCY, URINE: Preg Test, Ur: NEGATIVE

## 2011-12-31 LAB — LIPASE, BLOOD: Lipase: 54 U/L (ref 11–59)

## 2011-12-31 MED ORDER — GI COCKTAIL ~~LOC~~
30.0000 mL | Freq: Once | ORAL | Status: AC
  Administered 2011-12-31: 30 mL via ORAL
  Filled 2011-12-31: qty 30

## 2011-12-31 MED ORDER — DEXTROSE 5 % IN LACTATED RINGERS IV BOLUS
1000.0000 mL | INTRAVENOUS | Status: AC
  Administered 2011-12-31: 1000 mL via INTRAVENOUS

## 2011-12-31 MED ORDER — ONDANSETRON 8 MG PO TBDP
8.0000 mg | ORAL_TABLET | ORAL | Status: AC
  Administered 2011-12-31: 8 mg via ORAL
  Filled 2011-12-31: qty 1

## 2011-12-31 MED ORDER — KETOROLAC TROMETHAMINE 60 MG/2ML IM SOLN
60.0000 mg | Freq: Once | INTRAMUSCULAR | Status: AC
  Administered 2011-12-31: 60 mg via INTRAMUSCULAR
  Filled 2011-12-31: qty 2

## 2011-12-31 NOTE — Discharge Instructions (Signed)
Drink lots of fluids. Take the phenergan you were prescribed to help with vomiting. Eat a BRAT diet and advance very slowly. Go to be checked at the [facility] clinic for followup.

## 2011-12-31 NOTE — MAU Provider Note (Signed)
History     CSN: [id number]  Arrival date and time: 12/31/11 4098   First Provider Initiated Contact with Patient 12/31/11 1040      Chief Complaint  Patient presents with  . Abdominal Pain    mid abd & epigastric   HPI  46 year old female who experienced sudden onset of abdominal pain at about 5 pm on Saturday.  Pain was "hard", "unbearable", "all I could do was ball up in a knot".  Pain was colicky in nature with a cycle length of 2-3 minutes, cramping in quality.  After five hours, she went to the Emergency Department where laboratory studies were performed, remarkable for a bicarbonate of 19, and a lactate of 2.7, as well as normal LFTs and lack of leukocytosis, and was discharged with immodium and phenyrgan.  She complains of nausea and diarrhea.  Has not vomited.  Her diarrhea is loose, black, mushy.    Has been tolerating water.  Hasn't been eating regular food but has taken a little broth.  Used marajuana on Saturday but hasn't used any since.  No sick contacts.  No travel outside the area.  Tested last year for HIV, it was negative.  Subjective fever, does not have thermometer.  OB History    Grav Para Term Preterm Abortions TAB SAB Ect Mult Living                  No past medical history on file. None  No past surgical history on file. BTL, c-section, SVDx2  No family history on file.  History  Substance Use Topics  . Smoking status: Not on file  . Smokeless tobacco: Not on file  . Alcohol Use: Not on file    Allergies: No Known Allergies  Prescriptions prior to admission  Medication Sig Dispense Refill  . famotidine (PEPCID) 20 MG tablet Take 1 tablet (20 mg total) by mouth 2 (two) times daily.  30 tablet  0  . Ibuprofen-Diphenhydramine HCl (ADVIL PM) 200-25 MG CAPS Take 1 tablet by mouth daily as needed. Takes for pain      . promethazine (PHENERGAN) 25 MG tablet Take 1 tablet (25 mg total) by mouth every 6 (six) hours as needed for nausea.  30  tablet  0  . loperamide (IMODIUM) 2 MG capsule Take 1 capsule (2 mg total) by mouth 4 (four) times daily as needed for diarrhea or loose stools.  12 capsule  0    ROS See HPI.  Other relevant ROS negative.  Physical Exam   Temperature 98.4 F (36.9 C), temperature source Oral, resp. rate 18.  Physical Exam  Constitutional: She is oriented to person, place, and time. She appears well-developed and well-nourished. No distress.  HENT:  Head: Normocephalic and atraumatic.  Eyes: Conjunctivae are normal. Right eye exhibits no discharge. Left eye exhibits no discharge. No scleral icterus.  Neck: No tracheal deviation present.  Cardiovascular:       Warm and well perfused without edema  Respiratory: Effort normal and breath sounds normal. No stridor.  GI: Soft. She exhibits no distension and no mass. There is no tenderness. There is no rebound and no guarding.  Musculoskeletal: She exhibits no edema.  Neurological: She is alert and oriented to person, place, and time.  Skin: Skin is warm and dry. She is not diaphoretic.  Psychiatric: She has a normal mood and affect. Her behavior is normal. Judgment and thought content normal.       When interacting, stated  she was having significant pain, but did not appear to be in pain when distracted.    MAU Course  Procedures  Results for orders placed during the hospital encounter of 12/31/11 (from the past 24 hour(s))  URINALYSIS, ROUTINE W REFLEX MICROSCOPIC     Status: Abnormal   Collection Time   12/31/11 10:01 AM      Component Value Range   Color, Urine YELLOW  YELLOW   APPearance CLEAR  CLEAR   Specific Gravity, Urine 1.010  1.005 - 1.030   pH 6.5  5.0 - 8.0   Glucose, UA NEGATIVE  NEGATIVE mg/dL   Hgb urine dipstick TRACE (*) NEGATIVE   Bilirubin Urine NEGATIVE  NEGATIVE   Ketones, ur NEGATIVE  NEGATIVE mg/dL   Protein, ur NEGATIVE  NEGATIVE mg/dL   Urobilinogen, UA 0.2  0.0 - 1.0 mg/dL   Nitrite NEGATIVE  NEGATIVE   Leukocytes,  UA NEGATIVE  NEGATIVE  URINE MICROSCOPIC-ADD ON     Status: Normal   Collection Time   12/31/11 10:01 AM      Component Value Range   Squamous Epithelial / LPF RARE  RARE   WBC, UA 0-2  <3 WBC/hpf   Bacteria, UA RARE  RARE  POCT PREGNANCY, URINE     Status: Normal   Collection Time   12/31/11 10:10 AM      Component Value Range   Preg Test, Ur NEGATIVE  NEGATIVE  CBC     Status: Abnormal   Collection Time   12/31/11 11:05 AM      Component Value Range   WBC 11.0 (*) 4.0 - 10.5 K/uL   RBC 5.53 (*) 3.87 - 5.11 MIL/uL   Hemoglobin 15.6 (*) 12.0 - 15.0 g/dL   HCT 16.1  09.6 - 04.5 %   MCV 83.2  78.0 - 100.0 fL   MCH 28.2  26.0 - 34.0 pg   MCHC 33.9  30.0 - 36.0 g/dL   RDW 40.9  81.1 - 91.4 %   Platelets 226  150 - 400 K/uL  DIFFERENTIAL     Status: Normal   Collection Time   12/31/11 11:05 AM      Component Value Range   Neutrophils Relative 69  43 - 77 %   Neutro Abs 7.6  1.7 - 7.7 K/uL   Lymphocytes Relative 22  12 - 46 %   Lymphs Abs 2.4  0.7 - 4.0 K/uL   Monocytes Relative 9  3 - 12 %   Monocytes Absolute 1.0  0.1 - 1.0 K/uL   Eosinophils Relative 0  0 - 5 %   Eosinophils Absolute 0.0  0.0 - 0.7 K/uL   Basophils Relative 0  0 - 1 %   Basophils Absolute 0.0  0.0 - 0.1 K/uL  COMPREHENSIVE METABOLIC PANEL     Status: Abnormal   Collection Time   12/31/11 11:05 AM      Component Value Range   Sodium 134 (*) 135 - 145 mEq/L   Potassium 3.5  3.5 - 5.1 mEq/L   Chloride 96  96 - 112 mEq/L   CO2 28  19 - 32 mEq/L   Glucose, Bld 104 (*) 70 - 99 mg/dL   BUN 10  6 - 23 mg/dL   Creatinine, Ser 7.82  0.50 - 1.10 mg/dL   Calcium 95.6  8.4 - 21.3 mg/dL   Total Protein 8.4 (*) 6.0 - 8.3 g/dL   Albumin 4.5  3.5 - 5.2 g/dL  AST 13  0 - 37 U/L   ALT 14  0 - 35 U/L   Alkaline Phosphatase 62  39 - 117 U/L   Total Bilirubin 0.7  0.3 - 1.2 mg/dL   GFR calc non Af Amer 86 (*) >90 mL/min   GFR calc Af Amer >90  >90 mL/min  AMYLASE     Status: Normal   Collection Time   12/31/11 11:05  AM      Component Value Range   Amylase 54  0 - 105 U/L  LIPASE, BLOOD     Status: Normal   Collection Time   12/31/11 11:05 AM      Component Value Range   Lipase 54  11 - 59 U/L    1241 - client getting IVF and Toradol IM.  States pain has improved.  Will give GI cocktail.  Reviewed BRAT diet.  Client had country ham yesterday.  Advised her and her sister to get established with MD in the area.  Discussed elevated blood pressure.  Advised to return to [facility] where she has been before.    Assessment and Plan  46 year old nonpregnant female with five days of nausea, diarrhea, and colicky abdominal pain consistent with gastroenteritis.  Abdomen is benign on exam.  By report, patient told RN she was not taking imodium but she stated to me she was.  I encouraged her to take the medications she was rx'd.  Hemoglobin is consistent with hemoconcentration but her urine spec grav is normal, likely indicating adequate current oral hydration in the setting of being volume down over the last several days.  I have given her 1 L of IV D5LR to assist with hydration.  CMP, amylase, lipase and hemoccult pending.  Care transferred to Nolene Bernheim, NP. Client very anxious, last BP 177/86.  BP on 12-28-11 were normal.  Will not begin treatment today.   Assessment Nausea and vomiting with soft stools Elevated BP  Plan Labs are normal IFV given Zofran 8 mg ODT prescribed. Follow up at [facility] to have high blood pressure evaluated.     Rachael Stone 12/31/2011, 10:43 AM

## 2011-12-31 NOTE — MAU Note (Addendum)
Was seen in ER Saturday PM for sudden onset stomach pain, nausea & diarrhea.  Was sent home c Rxs for phenergan,lomotil, & pepcid.  Has not taken lomotil. Pepcid & phenergan only helps a little.  But HAS been taking ADVIL PM for stomach pain & insomnia.  (advised pt about NSAIDs & stomach irritation.  Previously not aware). -  Pain is in mid-upper abd & constant at pain level 8/10.  Also states stool has been dark-brown to black & has a stronger odor than normal.  Has not vomited. Believes she may have had a fever on Sunday, but did not take temp.  Also has history of ectopic pregnancy p BTL, 10 yrs ago.  Unsure which side or if tube was removed.  This was 5 yrs p her BTL.

## 2012-07-23 ENCOUNTER — Emergency Department (HOSPITAL_COMMUNITY)
Admission: EM | Admit: 2012-07-23 | Discharge: 2012-07-23 | Disposition: A | Discharge status: Home / Self Care | Payer: Self-pay | Attending: Emergency Medicine | Admitting: Emergency Medicine

## 2012-07-23 ENCOUNTER — Encounter (HOSPITAL_COMMUNITY): Payer: Self-pay | Admitting: Emergency Medicine

## 2012-07-23 DIAGNOSIS — R229 Localized swelling, mass and lump, unspecified: Secondary | ICD-10-CM | POA: Insufficient documentation

## 2012-07-23 DIAGNOSIS — K0889 Other specified disorders of teeth and supporting structures: Secondary | ICD-10-CM

## 2012-07-23 DIAGNOSIS — K029 Dental caries, unspecified: Secondary | ICD-10-CM | POA: Insufficient documentation

## 2012-07-23 DIAGNOSIS — F17200 Nicotine dependence, unspecified, uncomplicated: Secondary | ICD-10-CM | POA: Insufficient documentation

## 2012-07-23 MED ORDER — IBUPROFEN 800 MG PO TABS: 800.0000 mg | ORAL_TABLET | Freq: Three times a day (TID) | ORAL | Status: DC | PRN

## 2012-07-23 MED ORDER — HYDROCODONE-ACETAMINOPHEN 5-325 MG PO TABS: 1.0000 | ORAL_TABLET | Freq: Four times a day (QID) | ORAL | Status: DC | PRN

## 2012-07-23 MED ORDER — PENICILLIN V POTASSIUM 500 MG PO TABS: 500.0000 mg | ORAL_TABLET | Freq: Four times a day (QID) | ORAL | Status: DC

## 2012-07-23 NOTE — ED Provider Notes (Signed)
Medical screening examination/treatment/procedure(s) were performed by non-physician practitioner and as supervising physician I was immediately available for consultation/collaboration.   Hollin Crewe, MD 07/23/12 1221 

## 2012-07-23 NOTE — ED Provider Notes (Signed)
History     CSN: [id number]  Arrival date & time 07/23/12  0848   First MD Initiated Contact with Patient 07/23/12 0900      Chief Complaint  Patient presents with  . Dental Pain    (Consider location/radiation/quality/duration/timing/severity/associated sxs/prior treatment) HPI Patient presents to the emergency department with left lower tooth pain.  Patient denies nausea, vomiting, difficulty swallowing, difficulty breathing, fever, or chest pain.  Patient states that she does not have a dentist and has not sought care for this cracked tooth.  Patient states that this tooth has been damaged for quite a while, but did not start hurting until 2 days, ago.       History reviewed. No pertinent past medical history.  Past Surgical History  Procedure Date  . Tubal ligation   . Exploratory laparotomy 2003    Ectopic Pregnancy - unsure which side or if tube was removed    No family history on file.  History  Substance Use Topics  . Smoking status: Current Every Day Smoker -- 6.0 packs/day for 15 years    Types: Cigarettes  . Smokeless tobacco: Never Used  . Alcohol Use: Yes     Comment: rarely    OB History    Grav Para Term Preterm Abortions TAB SAB Ect Mult Living   3 2 0 0 1 0 0 1 0 2       Review of Systems All other systems negative except as documented in the HPI. All pertinent positives and negatives as reviewed in the HPI. Allergies  Review of patient's allergies indicates no known allergies.  Home Medications   Current Outpatient Rx  Name  Route  Sig  Dispense  Refill  . IBUPROFEN 200 MG PO TABS   Oral   Take 400 mg by mouth every 8 (eight) hours as needed. For pain.           BP 163/93  Pulse 84  Temp 98.4 F (36.9 C) (Oral)  Resp 20  SpO2 100%  LMP 07/21/2012  Physical Exam  Nursing note and vitals reviewed. Constitutional: She is oriented to person, place, and time. She appears well-developed and well-nourished. No distress.  HENT:    Head: Normocephalic and atraumatic. No trismus in the jaw.  Mouth/Throat: Uvula is midline, oropharynx is clear and moist and mucous membranes are normal. Abnormal dentition. Dental caries present. No uvula swelling.    Neck: Normal range of motion. Neck supple. No tracheal deviation present.  Pulmonary/Chest: Effort normal. No stridor.  Lymphadenopathy:    She has no cervical adenopathy.  Neurological: She is alert and oriented to person, place, and time.  Skin: Skin is warm and dry. No rash noted.    ED Course  Procedures (including critical care time)  Patient be referred to dentistry and told to return here for any worsening in her condition advised her to rinse with warm water, peroxide 3 times a day   MDM         Carlyle Dolly, PA-C 07/23/12 [phone]

## 2012-07-23 NOTE — ED Notes (Signed)
Pt presenting to ed with c/o left side facial swelling pt states lower left side toothache x 3-4 days. Pt states "I think I have had a fever I have been waking up sweating"

## 2013-04-10 ENCOUNTER — Encounter (HOSPITAL_COMMUNITY): Payer: Self-pay | Admitting: *Deleted

## 2013-04-10 ENCOUNTER — Emergency Department (HOSPITAL_COMMUNITY)
Admission: EM | Admit: 2013-04-10 | Discharge: 2013-04-11 | Disposition: A | Discharge status: Home / Self Care | Payer: No Typology Code available for payment source | Attending: Emergency Medicine | Admitting: Emergency Medicine

## 2013-04-10 DIAGNOSIS — R109 Unspecified abdominal pain: Secondary | ICD-10-CM | POA: Insufficient documentation

## 2013-04-10 DIAGNOSIS — Z3202 Encounter for pregnancy test, result negative: Secondary | ICD-10-CM | POA: Insufficient documentation

## 2013-04-10 DIAGNOSIS — F17200 Nicotine dependence, unspecified, uncomplicated: Secondary | ICD-10-CM | POA: Insufficient documentation

## 2013-04-10 DIAGNOSIS — K2920 Alcoholic gastritis without bleeding: Secondary | ICD-10-CM | POA: Insufficient documentation

## 2013-04-10 DIAGNOSIS — E876 Hypokalemia: Secondary | ICD-10-CM | POA: Insufficient documentation

## 2013-04-10 LAB — CBC WITH DIFFERENTIAL/PLATELET
Basophils Absolute: 0 10*3/uL (ref 0.0–0.1)
HCT: 37.8 % (ref 36.0–46.0)
Hemoglobin: 12.9 g/dL (ref 12.0–15.0)
Lymphocytes Relative: 22 % (ref 12–46)
Monocytes Absolute: 0.4 10*3/uL (ref 0.1–1.0)
Neutro Abs: 5.7 10*3/uL (ref 1.7–7.7)
RDW: 15.1 % (ref 11.5–15.5)
WBC: 7.9 10*3/uL (ref 4.0–10.5)

## 2013-04-10 MED ORDER — ONDANSETRON HCL 4 MG/2ML IJ SOLN
4.0000 mg | Freq: Once | INTRAMUSCULAR | Status: AC
  Administered 2013-04-10: 4 mg via INTRAVENOUS
  Filled 2013-04-10: qty 2

## 2013-04-10 MED ORDER — FENTANYL CITRATE 0.05 MG/ML IJ SOLN
50.0000 ug | Freq: Once | INTRAMUSCULAR | Status: AC
  Administered 2013-04-10: 50 ug via INTRAVENOUS
  Filled 2013-04-10: qty 2

## 2013-04-10 MED ORDER — SODIUM CHLORIDE 0.9 % IV SOLN
Freq: Once | INTRAVENOUS | Status: AC
  Administered 2013-04-11: via INTRAVENOUS

## 2013-04-10 NOTE — ED Notes (Signed)
Patient is alert and oriented x3.  She is complaining of abdominal pain that started 2 hours ago. She denies vomiting and x2 with diarrhea.

## 2013-04-11 LAB — URINALYSIS, ROUTINE W REFLEX MICROSCOPIC
Bilirubin Urine: NEGATIVE
Glucose, UA: NEGATIVE mg/dL
Ketones, ur: NEGATIVE mg/dL
Protein, ur: NEGATIVE mg/dL

## 2013-04-11 LAB — COMPREHENSIVE METABOLIC PANEL
ALT: 14 U/L (ref 0–35)
AST: 19 U/L (ref 0–37)
Alkaline Phosphatase: 63 U/L (ref 39–117)
CO2: 20 mEq/L (ref 19–32)
Chloride: 100 mEq/L (ref 96–112)
Creatinine, Ser: 0.7 mg/dL (ref 0.50–1.10)
GFR calc non Af Amer: >90 mL/min (ref >90)
Total Bilirubin: 0.1 mg/dL — ABNORMAL LOW (ref 0.3–1.2)

## 2013-04-11 LAB — URINE MICROSCOPIC-ADD ON

## 2013-04-11 MED ORDER — POTASSIUM CHLORIDE 10 MEQ/100ML IV SOLN
10.0000 meq | INTRAVENOUS | Status: DC
  Administered 2013-04-11: 10 meq via INTRAVENOUS
  Filled 2013-04-11: qty 100

## 2013-04-11 MED ORDER — ONDANSETRON 8 MG PO TBDP: 8.0000 mg | ORAL_TABLET | Freq: Three times a day (TID) | ORAL | Status: DC | PRN

## 2013-04-11 MED ORDER — PANTOPRAZOLE SODIUM 40 MG IV SOLR
80.0000 mg | Freq: Once | INTRAVENOUS | Status: AC
  Administered 2013-04-11: 80 mg via INTRAVENOUS
  Filled 2013-04-11: qty 80

## 2013-04-11 MED ORDER — POTASSIUM CHLORIDE CRYS ER 20 MEQ PO TBCR
40.0000 meq | EXTENDED_RELEASE_TABLET | Freq: Once | ORAL | Status: AC
  Administered 2013-04-11: 40 meq via ORAL
  Filled 2013-04-11: qty 2

## 2013-04-11 MED ORDER — SUCRALFATE 1 G PO TABS
1.0000 g | ORAL_TABLET | Freq: Once | ORAL | Status: AC
  Administered 2013-04-11: 1 g via ORAL
  Filled 2013-04-11: qty 1

## 2013-04-11 MED ORDER — SUCRALFATE 1 G PO TABS: 1.0000 g | ORAL_TABLET | Freq: Four times a day (QID) | ORAL | Status: DC

## 2013-04-11 MED ORDER — PANTOPRAZOLE SODIUM 20 MG PO TBEC: 40.0000 mg | DELAYED_RELEASE_TABLET | Freq: Every day | ORAL | Status: DC

## 2013-04-11 NOTE — ED Notes (Signed)
Unable to complete through assessment.  Patient incoherent speech due to pain.

## 2013-04-11 NOTE — ED Notes (Signed)
Patient is alert and oriented x3.  She was given DC instructions and follow up visit instructions.  Patient gave verbal understanding. She was DC ambulatory under her own power to home.  V/S stable.  He was not showing any signs of distress on DC 

## 2013-04-11 NOTE — ED Provider Notes (Signed)
CSN: [id number]     Arrival date & time 04/10/13  2231 History   First MD Initiated Contact with Patient 04/10/13 2306     Chief Complaint  Patient presents with  . Abdominal Pain   (Consider location/radiation/quality/duration/timing/severity/associated sxs/prior Treatment) HPI 47 year old female presents to emergency department via EMS with reported abdominal pain.  Patient is a very poor historian.  She reports the pain started "a couple hours ago".  Pain is in the upper abdomen.  She denies previous history of similar pain.  She reports nausea without vomiting.  She denies previous medical history.  Prior exploratory laparotomy for ectopic pregnancy.  Patient unable to give further history do to pain and possible intoxication.  She reports she drinks 4 alcoholic drinks a day with a chaser.  History reviewed. No pertinent past medical history. Past Surgical History  Procedure Laterality Date  . Tubal ligation    . Exploratory laparotomy  2003    Ectopic Pregnancy - unsure which side or if tube was removed   History reviewed. No pertinent family history. History  Substance Use Topics  . Smoking status: Current Every Day Smoker -- 6.00 packs/day for 15 years    Types: Cigarettes  . Smokeless tobacco: Never Used  . Alcohol Use: Yes     Comment: rarely   OB History   Grav Para Term Preterm Abortions TAB SAB Ect Mult Living   3 2 0 0 1 0 0 1 0 2      Review of Systems  Unable to perform ROS: Other  intoxicated  Allergies  Ibuprofen  Home Medications   Current Outpatient Rx  Name  Route  Sig  Dispense  Refill  . ibuprofen (ADVIL,MOTRIN) 200 MG tablet   Oral   Take 400 mg by mouth every 8 (eight) hours as needed. For pain.          BP 161/88  Pulse 86  Temp(Src) 97.4 F (36.3 C) (Oral)  Resp 20  SpO2 100%  LMP 04/10/2013 Physical Exam  Nursing note and vitals reviewed. Constitutional: She is oriented to person, place, and time. She appears well-developed and  well-nourished. She appears distressed.  HENT:  Head: Normocephalic and atraumatic.  Nose: Nose normal.  Mouth/Throat: Oropharynx is clear and moist.  Eyes: Conjunctivae and EOM are normal. Pupils are equal, round, and reactive to light.  Neck: Normal range of motion. Neck supple. No JVD present. No tracheal deviation present. No thyromegaly present.  Cardiovascular: Normal rate, regular rhythm, normal heart sounds and intact distal pulses.  Exam reveals no gallop and no friction rub.   No murmur heard. Pulmonary/Chest: Effort normal and breath sounds normal. No stridor. No respiratory distress. She has no wheezes. She has no rales. She exhibits no tenderness.  Abdominal: Soft. Bowel sounds are normal. She exhibits no distension and no mass. There is tenderness (Tenderness across upper abdomen without rebound or guarding). There is no rebound and no guarding.  Musculoskeletal: Normal range of motion. She exhibits no edema and no tenderness.  Lymphadenopathy:    She has no cervical adenopathy.  Neurological: She is alert and oriented to person, place, and time. She exhibits normal muscle tone. Coordination normal.  Skin: Skin is warm and dry. No rash noted. No erythema. No pallor.  Psychiatric: She has a normal mood and affect. Her behavior is normal. Judgment and thought content normal.    ED Course  Procedures (including critical care time) Labs Review Labs Reviewed  COMPREHENSIVE METABOLIC PANEL - Abnormal; Notable  for the following:    Potassium 2.9 (*)    Glucose, Bld 165 (*)    BUN 4 (*)    Total Bilirubin 0.1 (*)    All other components within normal limits  ETHANOL - Abnormal; Notable for the following:    Alcohol, Ethyl (B) 171 (*)    All other components within normal limits  URINALYSIS, ROUTINE W REFLEX MICROSCOPIC - Abnormal; Notable for the following:    APPearance CLOUDY (*)    Hgb urine dipstick LARGE (*)    All other components within normal limits  CBC WITH  DIFFERENTIAL  PREGNANCY, URINE  LIPASE, BLOOD  URINE MICROSCOPIC-ADD ON   Imaging Review No results found.  MDM   1. Abdominal pain   2. Alcoholic gastritis   3. Hypokalemia    47 year old female with upper abdominal pain.  Concern for gallbladder pathology, pancreatitis, or alcohol, gastritis.  We'll check labs, give pain and nausea medicines.  Her abdomen is soft, and nonacute at this time, do not feel she needs a CT at this point.    Olivia Mackie, MD 04/11/13 [phone]

## 2013-10-25 ENCOUNTER — Ambulatory Visit (HOSPITAL_COMMUNITY)
Admission: RE | Admit: 2013-10-25 | Discharge: 2013-10-25 | Disposition: A | Discharge status: Home / Self Care | Payer: No Typology Code available for payment source | Source: Ambulatory Visit | Attending: Internal Medicine | Admitting: Internal Medicine

## 2013-10-25 ENCOUNTER — Other Ambulatory Visit (HOSPITAL_COMMUNITY): Payer: Self-pay | Admitting: Internal Medicine

## 2013-10-25 DIAGNOSIS — M545 Low back pain, unspecified: Secondary | ICD-10-CM | POA: Insufficient documentation

## 2013-10-25 DIAGNOSIS — R52 Pain, unspecified: Secondary | ICD-10-CM

## 2013-11-30 ENCOUNTER — Emergency Department (HOSPITAL_COMMUNITY)
Admission: EM | Admit: 2013-11-30 | Discharge: 2013-11-30 | Disposition: A | Discharge status: Home / Self Care | Payer: No Typology Code available for payment source | Attending: Emergency Medicine | Admitting: Emergency Medicine

## 2013-11-30 ENCOUNTER — Emergency Department (HOSPITAL_COMMUNITY): Admission: EM | Admit: 2013-11-30 | Discharge: 2013-11-30 | Discharge status: Against Medical Advice | Payer: Self-pay

## 2013-11-30 ENCOUNTER — Encounter (HOSPITAL_COMMUNITY): Payer: Self-pay | Admitting: Emergency Medicine

## 2013-11-30 DIAGNOSIS — M549 Dorsalgia, unspecified: Secondary | ICD-10-CM

## 2013-11-30 DIAGNOSIS — F17200 Nicotine dependence, unspecified, uncomplicated: Secondary | ICD-10-CM | POA: Insufficient documentation

## 2013-11-30 DIAGNOSIS — G8929 Other chronic pain: Secondary | ICD-10-CM | POA: Insufficient documentation

## 2013-11-30 DIAGNOSIS — Z79899 Other long term (current) drug therapy: Secondary | ICD-10-CM | POA: Insufficient documentation

## 2013-11-30 DIAGNOSIS — M546 Pain in thoracic spine: Secondary | ICD-10-CM | POA: Insufficient documentation

## 2013-11-30 HISTORY — DX: Dorsalgia, unspecified: M54.9

## 2013-11-30 MED ORDER — NAPROXEN 500 MG PO TABS: 500.0000 mg | ORAL_TABLET | Freq: Two times a day (BID) | ORAL | Status: DC

## 2013-11-30 MED ORDER — KETOROLAC TROMETHAMINE 30 MG/ML IJ SOLN
30.0000 mg | Freq: Once | INTRAMUSCULAR | Status: AC
  Administered 2013-11-30: 30 mg via INTRAMUSCULAR
  Filled 2013-11-30: qty 1

## 2013-11-30 MED ORDER — TRAMADOL HCL 50 MG PO TABS: 50.0000 mg | ORAL_TABLET | Freq: Four times a day (QID) | ORAL | Status: DC | PRN

## 2013-11-30 NOTE — Progress Notes (Signed)
[facility] spoke with patient about Rachael Stone. Patient stated that she had an apt with PCP on 7/6/ Patietn stated that she called pcp this morning and was told that they did not have any openings until 7/6.

## 2013-11-30 NOTE — ED Provider Notes (Signed)
CSN: [id number]     Arrival date & time 11/30/13  1006 History   First MD Initiated Contact with Patient 11/30/13 1011     Chief Complaint  Patient presents with  . Back Pain     (Consider location/radiation/quality/duration/timing/severity/associated sxs/prior Treatment) HPI Comments: The patient is a 48 year old female with past medical history of back pain presents emergency room with chief complaint of intermittent back pain for over a year. The patient reports bilateral mid back pain worsened by movement. She reports she works at [employer] and loads heavy boxes, wears lumbar support and has proper shoes. She reports she takes gabapentin and meloxicam.  She reports taking Bayer back and body today without resolution of her symptoms. She denies paresthesias or weakness in lower extremities. PCP: Elizabeth Palau, MD  Patient is a 48 y.o. female presenting with back pain. The history is provided by the patient and medical records. No language interpreter was used.  Back Pain Associated symptoms: no abdominal pain, no dysuria, no fever, no numbness and no weakness     Past Medical History  Diagnosis Date  . Back pain    Past Surgical History  Procedure Laterality Date  . Tubal ligation    . Exploratory laparotomy  2003    Ectopic Pregnancy - unsure which side or if tube was removed   No family history on file. History  Substance Use Topics  . Smoking status: Current Every Day Smoker -- 6.00 packs/day for 15 years    Types: Cigarettes  . Smokeless tobacco: Never Used  . Alcohol Use: Yes     Comment: rarely   OB History   Grav Para Term Preterm Abortions TAB SAB Ect Mult Living   3 2 0 0 1 0 0 1 0 2      Review of Systems  Constitutional: Negative for fever and chills.  Gastrointestinal: Negative for abdominal pain.  Genitourinary: Negative for dysuria, urgency and difficulty urinating.  Musculoskeletal: Positive for back pain. Negative for gait problem, joint swelling,  myalgias, neck pain and neck stiffness.  Neurological: Negative for weakness and numbness.  All other systems reviewed and are negative.     Allergies  Ibuprofen  Home Medications   Prior to Admission medications   Medication Sig Start Date End Date Taking? Authorizing Provider  ondansetron (ZOFRAN ODT) 8 MG disintegrating tablet Take 1 tablet (8 mg total) by mouth every 8 (eight) hours as needed for nausea. 04/11/13   Kalman Drape, MD  pantoprazole (PROTONIX) 20 MG tablet Take 2 tablets (40 mg total) by mouth daily. 04/11/13   Kalman Drape, MD  sucralfate (CARAFATE) 1 G tablet Take 1 tablet (1 g total) by mouth 4 (four) times daily. 04/11/13   Kalman Drape, MD   BP 163/88  Pulse 97  Temp(Src) 99.1 F (37.3 C) (Oral)  Resp 16  SpO2 100% Physical Exam  Nursing note and vitals reviewed. Constitutional: She is oriented to person, place, and time. She appears well-developed and well-nourished.  Non-toxic appearance. She does not have a sickly appearance. She does not appear ill. No distress.  HENT:  Head: Normocephalic and atraumatic.  Eyes: EOM are normal. Pupils are equal, round, and reactive to light.  Neck: Normal range of motion. Neck supple.  Pulmonary/Chest: Effort normal. No respiratory distress.  Abdominal: Soft. There is no tenderness. There is no rebound and no guarding.  Musculoskeletal: Normal range of motion.       Thoracic back: She exhibits tenderness. She exhibits normal  range of motion and no deformity.       Back:  No midline C-spine, T-spine, or L-spine tenderness with no step-offs, crepitus, or deformities noted. Paravertebral tenderness to bilateral thoracic spine. No obvious spasm noted.  Neurological: She is alert and oriented to person, place, and time.  Skin: Skin is warm and dry. She is not diaphoretic.  Psychiatric: She has a normal mood and affect. Her behavior is normal.    ED Course  Procedures (including critical care time) Labs Review Labs  Reviewed - No data to display  Imaging Review No results found.   EKG Interpretation None      MDM   Final diagnoses:  Back pain   Patient with intermittent chronic back pain for over a year. No midline tenderness or recent injury. Patient reports taking date and has been without full resolution of symptoms will prescribe naproxen, Ultram for breakthrough pain. EMR shows negative lumbar films and shows mild degenerative changes thoracic spine both performed on 10/26/13. Discussed imaging results from April 2015, and treatment plan with the patient, which includes anti-inflammatory, back exercises and core exercises, hamstring flexibility. Return precautions given. Reports understanding and no other concerns at this time.  Patient is stable for discharge at this time.  Meds given in ED:  Medications  ketorolac (TORADOL) 30 MG/ML injection 30 mg (not administered)    New Prescriptions   NAPROXEN (NAPROSYN) 500 MG TABLET    Take 1 tablet (500 mg total) by mouth 2 (two) times daily. Take with food   TRAMADOL (ULTRAM) 50 MG TABLET    Take 1 tablet (50 mg total) by mouth every 6 (six) hours as needed.      Lorrine Kin, PA-C 11/30/13 1103

## 2013-11-30 NOTE — ED Notes (Signed)
Per pt, states chronic back pain-started spasming this am-states all her joints hurt, multiple complaints

## 2013-11-30 NOTE — Discharge Instructions (Signed)
Call for a follow up appointment with a Family or Primary Care Provider, or further evaluation of your medication list and back pain. Return if Symptoms worsen.   Take medication as prescribed.  Ice her back 3-4 times a day. Continue to wear your lumbar support and proper shoes while at work. Use proper form and lifting heavy items. Stopped taking MOBIC or meloxicam, and start taking naproxen twice a day with food. You can take the Ultram for breakthrough pain as needed. Stopped smoking cigarettes.

## 2013-11-30 NOTE — ED Provider Notes (Signed)
Medical screening examination/treatment/procedure(s) were performed by non-physician practitioner and as supervising physician I was immediately available for consultation/collaboration.   EKG Interpretation None        Orpah Greek, MD 11/30/13 [phone]

## 2014-02-14 ENCOUNTER — Other Ambulatory Visit: Payer: Self-pay | Admitting: Nurse Practitioner

## 2014-02-14 DIAGNOSIS — Z1231 Encounter for screening mammogram for malignant neoplasm of breast: Secondary | ICD-10-CM

## 2014-02-22 ENCOUNTER — Ambulatory Visit
Admission: RE | Admit: 2014-02-22 | Discharge: 2014-02-22 | Disposition: A | Discharge status: Home / Self Care | Payer: No Typology Code available for payment source | Source: Ambulatory Visit | Attending: Nurse Practitioner | Admitting: Nurse Practitioner

## 2014-02-22 DIAGNOSIS — Z1231 Encounter for screening mammogram for malignant neoplasm of breast: Secondary | ICD-10-CM

## 2014-05-15 ENCOUNTER — Encounter (HOSPITAL_COMMUNITY): Payer: Self-pay | Admitting: Emergency Medicine

## 2015-01-16 ENCOUNTER — Emergency Department (HOSPITAL_COMMUNITY): Payer: Self-pay

## 2015-01-16 ENCOUNTER — Emergency Department (HOSPITAL_COMMUNITY)
Admission: EM | Admit: 2015-01-16 | Discharge: 2015-01-16 | Discharge status: Against Medical Advice | Payer: Self-pay | Attending: Emergency Medicine | Admitting: Emergency Medicine

## 2015-01-16 ENCOUNTER — Encounter (HOSPITAL_COMMUNITY): Payer: Self-pay | Admitting: Physical Medicine and Rehabilitation

## 2015-01-16 DIAGNOSIS — R079 Chest pain, unspecified: Secondary | ICD-10-CM | POA: Insufficient documentation

## 2015-01-16 DIAGNOSIS — Z720 Tobacco use: Secondary | ICD-10-CM | POA: Insufficient documentation

## 2015-01-16 LAB — CBC WITH DIFFERENTIAL/PLATELET
BASOS ABS: 0 10*3/uL (ref 0.0–0.1)
Basophils Relative: 0 % (ref 0–1)
Eosinophils Absolute: 0.1 10*3/uL (ref 0.0–0.7)
Eosinophils Relative: 1 % (ref 0–5)
HCT: 38.8 % (ref 36.0–46.0)
Hemoglobin: 13.1 g/dL (ref 12.0–15.0)
Lymphocytes Relative: 25 % (ref 12–46)
Lymphs Abs: 2 10*3/uL (ref 0.7–4.0)
MCH: 28.7 pg (ref 26.0–34.0)
MCHC: 33.8 g/dL (ref 30.0–36.0)
MCV: 84.9 fL (ref 78.0–100.0)
Monocytes Absolute: 0.5 10*3/uL (ref 0.1–1.0)
Monocytes Relative: 6 % (ref 3–12)
NEUTROS ABS: 5.4 10*3/uL (ref 1.7–7.7)
Neutrophils Relative %: 68 % (ref 43–77)
PLATELETS: 183 10*3/uL (ref 150–400)
RBC: 4.57 MIL/uL (ref 3.87–5.11)
RDW: 15 % (ref 11.5–15.5)
WBC: 7.9 10*3/uL (ref 4.0–10.5)

## 2015-01-16 LAB — COMPREHENSIVE METABOLIC PANEL
ALBUMIN: 3.9 g/dL (ref 3.5–5.0)
ALT: 13 U/L — ABNORMAL LOW (ref 14–54)
AST: 21 U/L (ref 15–41)
Alkaline Phosphatase: 53 U/L (ref 38–126)
Anion gap: 6 (ref 5–15)
BUN: 8 mg/dL (ref 6–20)
CALCIUM: 8.6 mg/dL — AB (ref 8.9–10.3)
CO2: 23 mmol/L (ref 22–32)
CREATININE: 0.94 mg/dL (ref 0.44–1.00)
Chloride: 110 mmol/L (ref 101–111)
GFR calc Af Amer: >60 mL/min (ref >60)
GFR calc non Af Amer: >60 mL/min (ref >60)
Glucose, Bld: 96 mg/dL (ref 65–99)
Potassium: 3.4 mmol/L — ABNORMAL LOW (ref 3.5–5.1)
Sodium: 139 mmol/L (ref 135–145)
TOTAL PROTEIN: 7.5 g/dL (ref 6.5–8.1)
Total Bilirubin: 0.4 mg/dL (ref 0.3–1.2)

## 2015-01-16 LAB — I-STAT TROPONIN, ED: Troponin i, poc: 0 ng/mL (ref 0.00–0.08)

## 2015-01-16 NOTE — ED Notes (Signed)
Called patient x 2.  No answer.   Tried to call patient number on file - fast busy signal.

## 2015-01-16 NOTE — ED Notes (Signed)
Pt presents to department for evaluation of diffuse chest pain radiating to both arms. Ongoing x1 week. Respirations unlabored. 10/10 pain upon arrival to ED.

## 2015-01-16 NOTE — ED Notes (Signed)
Called patient x 2 to move back to room.  No answer.

## 2015-01-20 ENCOUNTER — Emergency Department (HOSPITAL_COMMUNITY)
Admission: EM | Admit: 2015-01-20 | Discharge: 2015-01-20 | Disposition: A | Discharge status: Home / Self Care | Payer: Self-pay | Attending: Emergency Medicine | Admitting: Emergency Medicine

## 2015-01-20 ENCOUNTER — Encounter (HOSPITAL_COMMUNITY): Payer: Self-pay | Admitting: Emergency Medicine

## 2015-01-20 DIAGNOSIS — Y999 Unspecified external cause status: Secondary | ICD-10-CM | POA: Insufficient documentation

## 2015-01-20 DIAGNOSIS — S50861A Insect bite (nonvenomous) of right forearm, initial encounter: Secondary | ICD-10-CM | POA: Insufficient documentation

## 2015-01-20 DIAGNOSIS — S80862A Insect bite (nonvenomous), left lower leg, initial encounter: Secondary | ICD-10-CM | POA: Insufficient documentation

## 2015-01-20 DIAGNOSIS — Z720 Tobacco use: Secondary | ICD-10-CM | POA: Insufficient documentation

## 2015-01-20 DIAGNOSIS — W57XXXA Bitten or stung by nonvenomous insect and other nonvenomous arthropods, initial encounter: Secondary | ICD-10-CM | POA: Insufficient documentation

## 2015-01-20 DIAGNOSIS — Z791 Long term (current) use of non-steroidal anti-inflammatories (NSAID): Secondary | ICD-10-CM | POA: Insufficient documentation

## 2015-01-20 DIAGNOSIS — S50862A Insect bite (nonvenomous) of left forearm, initial encounter: Secondary | ICD-10-CM | POA: Insufficient documentation

## 2015-01-20 DIAGNOSIS — S80861A Insect bite (nonvenomous), right lower leg, initial encounter: Secondary | ICD-10-CM | POA: Insufficient documentation

## 2015-01-20 DIAGNOSIS — Z79899 Other long term (current) drug therapy: Secondary | ICD-10-CM | POA: Insufficient documentation

## 2015-01-20 DIAGNOSIS — Y929 Unspecified place or not applicable: Secondary | ICD-10-CM | POA: Insufficient documentation

## 2015-01-20 DIAGNOSIS — Y939 Activity, unspecified: Secondary | ICD-10-CM | POA: Insufficient documentation

## 2015-01-20 MED ORDER — DIPHENHYDRAMINE HCL 25 MG PO CAPS
25.0000 mg | ORAL_CAPSULE | Freq: Once | ORAL | Status: AC
  Administered 2015-01-20: 25 mg via ORAL
  Filled 2015-01-20: qty 1

## 2015-01-20 NOTE — Discharge Instructions (Signed)

## 2015-01-20 NOTE — ED Provider Notes (Signed)
CSN: [id number]     Arrival date & time 01/20/15  2122 History   First MD Initiated Contact with Patient 01/20/15 [phone]     Chief Complaint  Patient presents with  . Rash    The patient said she was here and left AMA but she had gotten blood drawn, x-rays and other test done.  SHe is here to be seen for an itchy rash that is all over her body but also to possibly follow up on the results from that visit.     (Consider location/radiation/quality/duration/timing/severity/associated sxs/prior Treatment) HPI Comments: Pt has a dog that she thinks may have fleas  Patient is a 49 y.o. female presenting with rash. The history is provided by the patient. No language interpreter was used.  Rash Location:  Leg and shoulder/arm Shoulder/arm rash location:  L forearm and R forearm Leg rash location:  L lower leg and R lower leg Quality: itchiness   Severity:  Mild Onset quality:  Sudden Timing:  Constant Context: animal contact   Relieved by:  Nothing Worsened by:  Nothing tried Ineffective treatments:  None tried Associated symptoms: no nausea, no sore throat, no throat swelling, no URI and not vomiting     Past Medical History  Diagnosis Date  . Back pain    Past Surgical History  Procedure Laterality Date  . Tubal ligation    . Exploratory laparotomy  2003    Ectopic Pregnancy - unsure which side or if tube was removed   History reviewed. No pertinent family history. History  Substance Use Topics  . Smoking status: Current Every Day Smoker -- 6.00 packs/day for 15 years    Types: Cigarettes  . Smokeless tobacco: Never Used  . Alcohol Use: Yes     Comment: rarely   OB History    Gravida Para Term Preterm AB TAB SAB Ectopic Multiple Living   '3 2 0 0 1 0 0 1 0 2 '$     Review of Systems  HENT: Negative for sore throat.   Gastrointestinal: Negative for nausea and vomiting.  Skin: Positive for rash.  All other systems reviewed and are negative.     Allergies   Ibuprofen  Home Medications   Prior to Admission medications   Medication Sig Start Date End Date Taking? Authorizing Provider  cyclobenzaprine (FLEXERIL) 5 MG tablet Take 5 mg by mouth 2 (two) times daily as needed for muscle spasms.    Historical Provider, MD  gabapentin (NEURONTIN) 100 MG capsule Take 100 mg by mouth at bedtime.    Historical Provider, MD  naproxen (NAPROSYN) 500 MG tablet Take 1 tablet (500 mg total) by mouth 2 (two) times daily. Take with food 11/30/13   Harvie Heck, PA-C  omeprazole (PRILOSEC) 20 MG capsule Take 20 mg by mouth daily.    Historical Provider, MD  OVER THE COUNTER MEDICATION Take 2 tablets by mouth daily as needed. [facility] back [facility] body pain med    Historical Provider, MD  traMADol (ULTRAM) 50 MG tablet Take 1 tablet (50 mg total) by mouth every 6 (six) hours as needed. 11/30/13   Lauren Parker, PA-C   BP 178/76 mmHg  Pulse 96  Temp(Src) 97.9 F (36.6 C) (Oral)  Resp 17  Ht 5' 5.5" (1.664 m)  Wt 160 lb (72.576 kg)  BMI 26.21 kg/m2  SpO2 100%  LMP 12/12/2014 Physical Exam  Constitutional: She is oriented to person, place, and time. She appears well-developed and well-nourished.  HENT:  Right Ear: External  ear normal.  Left Ear: External ear normal.  Mouth/Throat: Oropharynx is clear and moist.  Cardiovascular: Normal rate and regular rhythm.   Pulmonary/Chest: Effort normal and breath sounds normal.  Musculoskeletal: Normal range of motion.  Neurological: She is alert and oriented to person, place, and time.  Skin:  Papular rash to extremities  Nursing note and vitals reviewed.   ED Course  Procedures (including critical care time) Labs Review Labs Reviewed - No data to display  Imaging Review No results found.   EKG Interpretation None      MDM   Final diagnoses:  Flea bite    Rash consistent with insect bites. Discussed symptomatic treatment at home    Glendell Docker, NP 01/20/15 [place], MD 01/21/15  [phone]

## 2015-01-20 NOTE — ED Notes (Signed)
The patient said she was here and left AMA but she had gotten blood drawn, x-rays and other test done.  SHe is here to be seen for an itchy rash that is all over her body but also to possibly follow up on the results from that visit.  The patient says she has a dog and thinks it is fleas from the dog.  She denies pain but says it is itchy.

## 2015-11-27 ENCOUNTER — Emergency Department (HOSPITAL_COMMUNITY)
Admission: EM | Admit: 2015-11-27 | Discharge: 2015-11-27 | Disposition: A | Discharge status: Home / Self Care | Payer: Self-pay | Attending: Emergency Medicine | Admitting: Emergency Medicine

## 2015-11-27 ENCOUNTER — Encounter (HOSPITAL_COMMUNITY): Payer: Self-pay | Admitting: Nurse Practitioner

## 2015-11-27 ENCOUNTER — Emergency Department (HOSPITAL_COMMUNITY): Payer: Self-pay

## 2015-11-27 DIAGNOSIS — M79602 Pain in left arm: Secondary | ICD-10-CM | POA: Insufficient documentation

## 2015-11-27 DIAGNOSIS — R05 Cough: Secondary | ICD-10-CM | POA: Insufficient documentation

## 2015-11-27 DIAGNOSIS — M549 Dorsalgia, unspecified: Secondary | ICD-10-CM | POA: Insufficient documentation

## 2015-11-27 DIAGNOSIS — F17210 Nicotine dependence, cigarettes, uncomplicated: Secondary | ICD-10-CM | POA: Insufficient documentation

## 2015-11-27 DIAGNOSIS — R059 Cough, unspecified: Secondary | ICD-10-CM

## 2015-11-27 LAB — BASIC METABOLIC PANEL
Anion gap: 8 (ref 5–15)
BUN: 9 mg/dL (ref 6–20)
CO2: 26 mmol/L (ref 22–32)
CREATININE: 0.85 mg/dL (ref 0.44–1.00)
Calcium: 8.6 mg/dL — ABNORMAL LOW (ref 8.9–10.3)
Chloride: 104 mmol/L (ref 101–111)
GFR calc Af Amer: >60 mL/min (ref >60)
Glucose, Bld: 96 mg/dL (ref 65–99)
Potassium: 3.1 mmol/L — ABNORMAL LOW (ref 3.5–5.1)
SODIUM: 138 mmol/L (ref 135–145)

## 2015-11-27 LAB — CBC WITH DIFFERENTIAL/PLATELET
Basophils Absolute: 0 10*3/uL (ref 0.0–0.1)
Basophils Relative: 0 %
EOS ABS: 0.1 10*3/uL (ref 0.0–0.7)
EOS PCT: 1 %
HCT: 37.2 % (ref 36.0–46.0)
Hemoglobin: 12.2 g/dL (ref 12.0–15.0)
LYMPHS ABS: 1.8 10*3/uL (ref 0.7–4.0)
Lymphocytes Relative: 29 %
MCH: 28.2 pg (ref 26.0–34.0)
MCHC: 32.8 g/dL (ref 30.0–36.0)
MCV: 86.1 fL (ref 78.0–100.0)
MONO ABS: 0.5 10*3/uL (ref 0.1–1.0)
MONOS PCT: 8 %
Neutro Abs: 3.9 10*3/uL (ref 1.7–7.7)
Neutrophils Relative %: 63 %
PLATELETS: 207 10*3/uL (ref 150–400)
RBC: 4.32 MIL/uL (ref 3.87–5.11)
RDW: 13.9 % (ref 11.5–15.5)
WBC: 6.2 10*3/uL (ref 4.0–10.5)

## 2015-11-27 LAB — I-STAT TROPONIN, ED: TROPONIN I, POC: 0 ng/mL (ref 0.00–0.08)

## 2015-11-27 MED ORDER — ASPIRIN 81 MG PO CHEW
324.0000 mg | CHEWABLE_TABLET | Freq: Once | ORAL | Status: AC
  Administered 2015-11-27: 324 mg via ORAL
  Filled 2015-11-27: qty 4

## 2015-11-27 NOTE — ED Notes (Signed)
Pt c/o 1 week history of painful spasms in her back radiating down bilateral arms, cough, nasal congestion, and black spots in her mouth. She denies fevers, n/v, bowel/bladder changes. She is alert, breathing easily

## 2015-11-27 NOTE — ED Provider Notes (Signed)
HPI Comments: Rachael Stone is a 50 y.o. female who presents to the Emergency Department complaining of constant, moderate, left arm pain x 1 week. She reports associated productive cough with yellow sputum, n/v, LUE paraesthesia and intermittent diaphoresis. Pt reports a radiation of her pain through her chest and back when coughing. Pt reports a FMHx of MI with her mother's first MI occuring at 41 y/o. Pt denies CP, SOB or any other associated symptoms at this time.  Patient presents today with pain of her left arm.  No injury or trauma to the arm.  Patient is tearful and very concerned because her mother had a MI at the age of 47.  Will order EKG, CBC, BMP, troponin, and CXR.  Patient also given 324 mg ASA.  Feel that the patient should be moved to the acute side of the ED due to concern for cardiac pathology.  Hyman Bible, PA-C 11/27/15 1238  Harvel Quale, MD 11/29/15 [phone]

## 2015-11-27 NOTE — ED Notes (Signed)
Pt moved to lobby to wait for acute bed.

## 2015-11-27 NOTE — ED Notes (Signed)
Pt c/o left arm pain with n/v x 1 week. Pt tearful because her mother had MI at her age. PA notified.

## 2015-11-27 NOTE — ED Notes (Signed)
PA requested pt be moved to acute bed. Charge nurse notified. Will complete EKG and draw labs then move back to lobby to wait for a bed.

## 2015-11-27 NOTE — ED Notes (Signed)
Pt brought from waiting room to room 15, pt tearful, anxious, remains on phone during nurse exam.  Updated on labs and reassured.  Pt walked to restroom and then left without being seen further.  Pt encouraged to stay by nurse in hall, did not respond and walked out.  Pt alert, oriented, ambulatory and in NAD.

## 2016-01-07 ENCOUNTER — Encounter (HOSPITAL_COMMUNITY): Payer: Self-pay | Admitting: Emergency Medicine

## 2016-01-07 ENCOUNTER — Emergency Department (HOSPITAL_COMMUNITY)
Admission: EM | Admit: 2016-01-07 | Discharge: 2016-01-07 | Disposition: A | Discharge status: Home / Self Care | Payer: Self-pay | Attending: Emergency Medicine | Admitting: Emergency Medicine

## 2016-01-07 ENCOUNTER — Emergency Department (HOSPITAL_COMMUNITY): Payer: Self-pay

## 2016-01-07 DIAGNOSIS — F1290 Cannabis use, unspecified, uncomplicated: Secondary | ICD-10-CM | POA: Insufficient documentation

## 2016-01-07 DIAGNOSIS — Z791 Long term (current) use of non-steroidal anti-inflammatories (NSAID): Secondary | ICD-10-CM | POA: Insufficient documentation

## 2016-01-07 DIAGNOSIS — J40 Bronchitis, not specified as acute or chronic: Secondary | ICD-10-CM | POA: Insufficient documentation

## 2016-01-07 DIAGNOSIS — F17210 Nicotine dependence, cigarettes, uncomplicated: Secondary | ICD-10-CM | POA: Insufficient documentation

## 2016-01-07 LAB — RAPID STREP SCREEN (MED CTR MEBANE ONLY): STREPTOCOCCUS, GROUP A SCREEN (DIRECT): NEGATIVE

## 2016-01-07 MED ORDER — PREDNISONE 20 MG PO TABS
60.0000 mg | ORAL_TABLET | Freq: Once | ORAL | Status: AC
  Administered 2016-01-07: 60 mg via ORAL
  Filled 2016-01-07: qty 3

## 2016-01-07 MED ORDER — IPRATROPIUM-ALBUTEROL 0.5-2.5 (3) MG/3ML IN SOLN
3.0000 mL | Freq: Once | RESPIRATORY_TRACT | Status: AC
  Administered 2016-01-07: 3 mL via RESPIRATORY_TRACT
  Filled 2016-01-07: qty 3

## 2016-01-07 MED ORDER — ALBUTEROL SULFATE HFA 108 (90 BASE) MCG/ACT IN AERS
1.0000 | INHALATION_SPRAY | RESPIRATORY_TRACT | Status: DC | PRN
Start: 2016-01-07 — End: 2016-01-07
  Administered 2016-01-07: 2 via RESPIRATORY_TRACT
  Filled 2016-01-07: qty 6.7

## 2016-01-07 MED ORDER — PREDNISONE 20 MG PO TABS: 40.0000 mg | ORAL_TABLET | Freq: Every day | ORAL | Status: DC

## 2016-01-07 NOTE — ED Notes (Addendum)
Pt c/o cough, sore throat, and SOB when having a "coughing fit." Pt A&Ox4 and ambulatory. Pt c/o pain when swallowing. Pt c/o productive cough but unable to tell RN what color it is. Denies chest pain.

## 2016-01-07 NOTE — ED Provider Notes (Signed)
CSN: [id number]     Arrival date & time 01/07/16  1409 History  By signing my name below, I, Rachael Stone, attest that this documentation has been prepared under the direction and in the presence of Rachael Evangelist, PA-C Electronically Signed: [place], ED Scribe. 01/07/2016. 3:37 PM.  Chief Complaint  Patient presents with  . Cough  . Sore Throat    The history is provided by the patient. No language interpreter was used.    HPI Comments: Rachael Stone is a 50 y.o. female who presents to the Emergency Department complaining of intermittent productive cough x brown/yellow sputum onset 1 week. Pt notes that she had a cold last week consisting of rhinorrhea, and nasal congestion. Pt reports that she came to the ED 5/16 for CP but she left before obtaining her results. She states that she is having associated symptoms of generalized chills, HA, nasal congestion, rhinorrhea, SOB x yesterday, and sore throat. She states that she has not tried any medications for the relief for her symptoms. She denies fever, body aches, ear pain, abdominal pain, N/V/D, dysuria and any other symptoms. Denies sick contacts. Pt states that she is a current cigarette smoker. Denies hx of seasonal allergies or asthma.    Past Medical History  Diagnosis Date  . Back pain    Past Surgical History  Procedure Laterality Date  . Tubal ligation    . Exploratory laparotomy  2003    Ectopic Pregnancy - unsure which side or if tube was removed   No family history on file. Social History  Substance Use Topics  . Smoking status: Current Every Day Smoker -- 6.00 packs/day for 15 years    Types: Cigarettes  . Smokeless tobacco: Never Used  . Alcohol Use: Yes     Comment: rarely   OB History    Gravida Para Term Preterm AB TAB SAB Ectopic Multiple Living   '3 2 0 0 1 0 0 1 0 2 '$     Review of Systems  Constitutional: Positive for chills. Negative for fever.  HENT: Positive for congestion, rhinorrhea and  sore throat. Negative for ear pain.   Respiratory: Positive for cough and shortness of breath.   Gastrointestinal: Negative for nausea, vomiting and diarrhea.  Musculoskeletal: Negative for myalgias.  Neurological: Positive for headaches.      Allergies  Ibuprofen  Home Medications   Prior to Admission medications   Medication Sig Start Date End Date Taking? Authorizing Provider  cyclobenzaprine (FLEXERIL) 5 MG tablet Take 5 mg by mouth 2 (two) times daily as needed for muscle spasms.    Historical Provider, MD  gabapentin (NEURONTIN) 100 MG capsule Take 100 mg by mouth at bedtime.    Historical Provider, MD  naproxen (NAPROSYN) 500 MG tablet Take 1 tablet (500 mg total) by mouth 2 (two) times daily. Take with food 11/30/13   Harvie Heck, PA-C  omeprazole (PRILOSEC) 20 MG capsule Take 20 mg by mouth daily.    Historical Provider, MD  OVER THE COUNTER MEDICATION Take 2 tablets by mouth daily as needed. [facility] back and body pain med    Historical Provider, MD  traMADol (ULTRAM) 50 MG tablet Take 1 tablet (50 mg total) by mouth every 6 (six) hours as needed. 11/30/13   Harvie Heck, PA-C   BP 146/83 mmHg  Pulse 84  Temp(Src) 98.8 F (37.1 C) (Oral)  Resp 18  SpO2 98%  LMP 12/17/2015   Physical Exam  Constitutional: She is oriented  to person, place, and time. She appears well-developed and well-nourished. No distress.  HENT:  Head: Normocephalic and atraumatic.  Right Ear: Hearing, tympanic membrane, external ear and ear canal normal.  Left Ear: Hearing, tympanic membrane, external ear and ear canal normal.  Nose: Mucosal edema and rhinorrhea present. Right sinus exhibits no maxillary sinus tenderness and no frontal sinus tenderness. Left sinus exhibits no maxillary sinus tenderness and no frontal sinus tenderness.  Mouth/Throat: Uvula is midline, oropharynx is clear and moist and mucous membranes are normal.  Eyes: Conjunctivae are normal. Pupils are equal, round, and reactive to  light. Right eye exhibits no discharge. Left eye exhibits no discharge. No scleral icterus.  Neck: Normal range of motion.  Cardiovascular: Normal rate and regular rhythm.  Exam reveals no gallop and no friction rub.   No murmur heard. Pulmonary/Chest: Effort normal. No respiratory distress. She has wheezes. She has no rales. She exhibits no tenderness.  Diffuse inspiratory and expiratory wheezes  Abdominal: Soft. She exhibits no distension. There is no tenderness.  Lymphadenopathy:    She has no cervical adenopathy.  Neurological: She is alert and oriented to person, place, and time.  Skin: Skin is warm and dry.  Psychiatric: She has a normal mood and affect. Her behavior is normal.  Nursing note and vitals reviewed.   ED Course  Procedures (including critical care time) DIAGNOSTIC STUDIES: Oxygen Saturation is 98% on RA, nl by my interpretation.    COORDINATION OF CARE: 3:36 PM Discussed treatment plan with pt at bedside which includes rapid strep screen and culture, breathing treatment, CXR, and steroids, and pt agreed to plan.    Labs Review Labs Reviewed  RAPID STREP SCREEN (NOT AT [facility]  CULTURE, GROUP A STREP [facility] [place])    Imaging Review Dg Chest 2 View  01/07/2016  CLINICAL DATA:  Productive cough, shortness of breath, and left arm pain. EXAM: CHEST  2 VIEW COMPARISON:  11/27/2015 and 01/16/2015 FINDINGS: There is prominent peribronchial thickening but there is no consolidative infiltrate or effusion. Heart size and pulmonary vascularity are normal. No bone abnormality. IMPRESSION: Prominent bronchitic changes. Electronically Signed   By: Lorriane Shire M.D.   On: 01/07/2016 16:16   I have personally reviewed and evaluated these images and lab results as part of my medical decision-making.   MDM   Final diagnoses:  Bronchitis   50 year old female who presents with symptoms consistent with acute bronchitis. CXR is remarkable for bronchitic changes and she has diffuse  wheezing. Duoneb x 2 given as well as dose of Prednisone. On recheck, patient states SOB is better and lung exam revealed improved wheezing. She is not hypoxic. Albuterol and spacer given with teaching provided. Will rx Prednisone burst. Advised smoking cessation. Antibiotics not indicated at this time as patient is afebrile. Patient is NAD, non-toxic, with stable VS. Patient is informed of clinical course, understands medical decision making process, and agrees with plan. Opportunity for questions provided and all questions answered. Return precautions given.   I personally performed the services described in this documentation, which was scribed in my presence. The recorded information has been reviewed and is accurate.    Rachael Evangelist, PA-C 01/07/16 2022  Charlesetta Shanks, MD 01/12/16 1050

## 2016-01-10 LAB — CULTURE, GROUP A STREP (THRC)

## 2016-04-06 ENCOUNTER — Emergency Department (HOSPITAL_COMMUNITY): Payer: Self-pay

## 2016-04-06 ENCOUNTER — Encounter (HOSPITAL_COMMUNITY): Payer: Self-pay | Admitting: Emergency Medicine

## 2016-04-06 ENCOUNTER — Emergency Department (HOSPITAL_COMMUNITY)
Admission: EM | Admit: 2016-04-06 | Discharge: 2016-04-06 | Disposition: A | Discharge status: Home / Self Care | Payer: Self-pay | Attending: Emergency Medicine | Admitting: Emergency Medicine

## 2016-04-06 DIAGNOSIS — E876 Hypokalemia: Secondary | ICD-10-CM | POA: Insufficient documentation

## 2016-04-06 DIAGNOSIS — M549 Dorsalgia, unspecified: Secondary | ICD-10-CM | POA: Insufficient documentation

## 2016-04-06 DIAGNOSIS — J4540 Moderate persistent asthma, uncomplicated: Secondary | ICD-10-CM | POA: Insufficient documentation

## 2016-04-06 DIAGNOSIS — F1290 Cannabis use, unspecified, uncomplicated: Secondary | ICD-10-CM | POA: Insufficient documentation

## 2016-04-06 DIAGNOSIS — J189 Pneumonia, unspecified organism: Secondary | ICD-10-CM | POA: Insufficient documentation

## 2016-04-06 DIAGNOSIS — Z79899 Other long term (current) drug therapy: Secondary | ICD-10-CM | POA: Insufficient documentation

## 2016-04-06 DIAGNOSIS — F17210 Nicotine dependence, cigarettes, uncomplicated: Secondary | ICD-10-CM | POA: Insufficient documentation

## 2016-04-06 LAB — BASIC METABOLIC PANEL
Anion gap: 7 (ref 5–15)
BUN: 10 mg/dL (ref 6–20)
CHLORIDE: 106 mmol/L (ref 101–111)
CO2: 24 mmol/L (ref 22–32)
CREATININE: 0.85 mg/dL (ref 0.44–1.00)
Calcium: 8.6 mg/dL — ABNORMAL LOW (ref 8.9–10.3)
Glucose, Bld: 91 mg/dL (ref 65–99)
POTASSIUM: 2.9 mmol/L — AB (ref 3.5–5.1)
SODIUM: 137 mmol/L (ref 135–145)

## 2016-04-06 LAB — CBC
HEMATOCRIT: 38.3 % (ref 36.0–46.0)
Hemoglobin: 12.8 g/dL (ref 12.0–15.0)
MCH: 29.2 pg (ref 26.0–34.0)
MCHC: 33.4 g/dL (ref 30.0–36.0)
MCV: 87.2 fL (ref 78.0–100.0)
PLATELETS: 253 10*3/uL (ref 150–400)
RBC: 4.39 MIL/uL (ref 3.87–5.11)
RDW: 15.3 % (ref 11.5–15.5)
WBC: 7.5 10*3/uL (ref 4.0–10.5)

## 2016-04-06 LAB — TROPONIN I: Troponin I: <0.03 ng/mL (ref <0.03)

## 2016-04-06 MED ORDER — PREDNISONE 10 MG PO TABS: ORAL_TABLET | ORAL | 0 refills | Status: DC

## 2016-04-06 MED ORDER — PREDNISONE 20 MG PO TABS
60.0000 mg | ORAL_TABLET | Freq: Every day | ORAL | Status: DC
  Filled 2016-04-06: qty 3

## 2016-04-06 MED ORDER — ALBUTEROL SULFATE HFA 108 (90 BASE) MCG/ACT IN AERS: 2.0000 | INHALATION_SPRAY | RESPIRATORY_TRACT | 0 refills | Status: DC | PRN

## 2016-04-06 MED ORDER — POTASSIUM CHLORIDE CRYS ER 20 MEQ PO TBCR
40.0000 meq | EXTENDED_RELEASE_TABLET | Freq: Once | ORAL | Status: AC
  Administered 2016-04-06: 40 meq via ORAL
  Filled 2016-04-06: qty 2

## 2016-04-06 MED ORDER — AZITHROMYCIN 250 MG PO TABS: ORAL_TABLET | ORAL | 0 refills | Status: DC

## 2016-04-06 MED ORDER — ALBUTEROL (5 MG/ML) CONTINUOUS INHALATION SOLN
10.0000 mg/h | INHALATION_SOLUTION | Freq: Once | RESPIRATORY_TRACT | Status: AC
  Administered 2016-04-06: 10 mg/h via RESPIRATORY_TRACT
  Filled 2016-04-06: qty 20

## 2016-04-06 MED ORDER — CEFTRIAXONE SODIUM 1 G IJ SOLR
1.0000 g | Freq: Once | INTRAMUSCULAR | Status: AC
  Administered 2016-04-06: 1 g via INTRAMUSCULAR
  Filled 2016-04-06: qty 10

## 2016-04-06 MED ORDER — ALBUTEROL SULFATE HFA 108 (90 BASE) MCG/ACT IN AERS
2.0000 | INHALATION_SPRAY | RESPIRATORY_TRACT | Status: DC
  Administered 2016-04-06: 2 via RESPIRATORY_TRACT
  Filled 2016-04-06: qty 6.7

## 2016-04-06 MED ORDER — HYDROCODONE-ACETAMINOPHEN 5-325 MG PO TABS
2.0000 | ORAL_TABLET | Freq: Once | ORAL | Status: AC
  Administered 2016-04-06: 2 via ORAL
  Filled 2016-04-06: qty 2

## 2016-04-06 MED ORDER — LIDOCAINE HCL 1 % IJ SOLN
INTRAMUSCULAR | Status: AC
  Administered 2016-04-06: 2.1 mL
  Filled 2016-04-06: qty 20

## 2016-04-06 MED ORDER — IPRATROPIUM-ALBUTEROL 0.5-2.5 (3) MG/3ML IN SOLN
3.0000 mL | Freq: Once | RESPIRATORY_TRACT | Status: AC
  Administered 2016-04-06: 3 mL via RESPIRATORY_TRACT
  Filled 2016-04-06: qty 3

## 2016-04-06 MED ORDER — AZITHROMYCIN 250 MG PO TABS
1000.0000 mg | ORAL_TABLET | Freq: Once | ORAL | Status: AC
  Administered 2016-04-06: 1000 mg via ORAL
  Filled 2016-04-06: qty 4

## 2016-04-06 NOTE — ED Notes (Signed)
Pt oxygen level at 89% RA, placed on 2LNC went up tp 92%. PA notified and breathing treatment ordered.

## 2016-04-06 NOTE — ED Triage Notes (Signed)
Pt SOB since Monday. Began to have L back pain on Wednesday. Also began having bilateral leg pain with this as well. No audible wheezing. No lightheadedness/dizziness. Pt reports SOB is worse at night when she is trying to sleep.

## 2016-04-06 NOTE — ED Provider Notes (Signed)
[name] DEPT Provider Note   CSN: [id number] Arrival date & time: 04/06/16  1106     History   Chief Complaint Chief Complaint  Patient presents with  . Shortness of Breath  . Back Pain  . Leg Pain    HPI Rachael Stone is a 50 y.o. female.  The history is provided by the patient. No language interpreter was used.  Shortness of Breath  This is a new problem. The current episode started more than 1 week ago. The problem has been gradually worsening. Associated symptoms include cough. Pertinent negatives include no fever and no leg pain. It is unknown what precipitated the problem. She has tried nothing for the symptoms. The treatment provided no relief. She has had no prior hospitalizations. She has had no prior ED visits. She has had no prior ICU admissions. Associated medical issues include asthma.  Back Pain   Pertinent negatives include no fever and no leg pain.  Leg Pain      Past Medical History:  Diagnosis Date  . Back pain     Patient Active Problem List   Diagnosis Date Noted  . VAGINAL DISCHARGE 09/10/2009  . CHEST PAIN UNSPECIFIED 08/13/2009  . DEPRESSIVE DISORDER NOT ELSEWHERE CLASSIFIED 12/14/2008  . MENORRHAGIA 12/14/2008  . NAUSEA, CHRONIC 12/14/2008  . ELEVATED BLOOD PRESSURE WITHOUT DIAGNOSIS OF HYPERTENSION 12/14/2008  . DYSFUNCTIONAL UTERINE BLEEDING 11/12/2007  . TOBACCO DEPENDENCE 09/10/2006  . GASTROESOPHAGEAL REFLUX, NO ESOPHAGITIS 09/10/2006    Past Surgical History:  Procedure Laterality Date  . EXPLORATORY LAPAROTOMY  2003   Ectopic Pregnancy - unsure which side or if tube was removed  . TUBAL LIGATION      OB History    Gravida Para Term Preterm AB Living   3 2 0 0 1 2   SAB TAB Ectopic Multiple Live Births   0 0 1 0         Home Medications    Prior to Admission medications   Medication Sig Start Date End Date Taking? Authorizing Provider  ePHEDrine-GuaiFENesin (PRIMATENE ASTHMA) 12.5-200 MG TABS Take 2 tablets by  mouth daily as needed (sob).   Yes Historical Provider, MD  albuterol (PROVENTIL HFA;VENTOLIN HFA) 108 (90 Base) MCG/ACT inhaler Inhale 2 puffs into the lungs every 4 (four) hours as needed for wheezing or shortness of breath. 04/06/16   Fransico Meadow, PA-C  azithromycin [facility]) 250 MG tablet One tablet a day beginning on 9/25 04/06/16   Fransico Meadow, PA-C  predniSONE (DELTASONE) 10 MG tablet 5,4,3,2,1 taper 04/06/16   Fransico Meadow, PA-C    Family History History reviewed. No pertinent family history.  Social History Social History  Substance Use Topics  . Smoking status: Current Every Day Smoker    Packs/day: 6.00    Years: 15.00    Types: Cigarettes  . Smokeless tobacco: Never Used  . Alcohol use Yes     Comment: rarely     Allergies   Ibuprofen   Review of Systems Review of Systems  Constitutional: Negative for fever.  Respiratory: Positive for cough and shortness of breath.   Musculoskeletal: Positive for back pain.  All other systems reviewed and are negative.    Physical Exam Updated Vital Signs BP 143/81   Pulse 99   Temp 97.8 F (36.6 C) (Oral)   Resp 26   LMP 03/14/2016 (Approximate)   SpO2 97%   Physical Exam  Constitutional: She appears well-developed and well-nourished. No distress.  HENT:  Head:  Normocephalic and atraumatic.  Eyes: Conjunctivae are normal.  Neck: Neck supple.  Cardiovascular: Normal rate and regular rhythm.   No murmur heard. Pulmonary/Chest: Effort normal. No respiratory distress. She has wheezes.  Abdominal: Soft. There is no tenderness.  Musculoskeletal: She exhibits no edema.  Neurological: She is alert.  Skin: Skin is warm and dry.  Psychiatric: She has a normal mood and affect.  Nursing note and vitals reviewed. Pt had contionued wheezing after 1st neb.  Pt placed on 1 hour continous neb treatment.  Chest xray shows possible infiltrate.  Pt given rocephin IM Zithromax and prednisone po.   Pt reports she feels much  better after second treatment.  Pt has potasium on 2.9   Pt given oral potassium 40 meq here. Pt given albuterol inhaler to go.   Pt advised to recheck with her Physicain.  In 3-4 days    ED Treatments / Results  Labs (all labs ordered are listed, but only abnormal results are displayed) Labs Reviewed  BASIC METABOLIC PANEL - Abnormal; Notable for the following:       Result Value   Potassium 2.9 (*)    Calcium 8.6 (*)    All other components within normal limits  CBC  TROPONIN I    EKG  EKG Interpretation None       Radiology Dg Chest 2 View  Result Date: 04/06/2016 CLINICAL DATA:  Shortness of breath EXAM: CHEST  2 VIEW COMPARISON:  01/07/2016 chest radiograph. FINDINGS: Stable cardiomediastinal silhouette with top-normal heart size. No pneumothorax. No pleural effusion. There is diffuse prominence of the parahilar interstitial markings, worsened. No acute consolidative airspace disease. IMPRESSION: Interval worsening of diffuse prominence of the parahilar interstitial markings with top-normal heart size. Differential includes interstitial lung disease or atypical infection. Electronically Signed   By: Ilona Sorrel M.D.   On: 04/06/2016 12:07    Procedures Procedures (including critical care time)  Medications Ordered in ED Medications  predniSONE (DELTASONE) tablet 60 mg (not administered)  potassium chloride SA (K-DUR,KLOR-CON) CR tablet 40 mEq (not administered)  albuterol (PROVENTIL HFA;VENTOLIN HFA) 108 (90 Base) MCG/ACT inhaler 2 puff (not administered)  HYDROcodone-acetaminophen (NORCO/VICODIN) 5-325 MG per tablet 2 tablet (2 tablets Oral Given 04/06/16 1331)  ipratropium-albuterol (DUONEB) 0.5-2.5 (3) MG/3ML nebulizer solution 3 mL (3 mLs Nebulization Given 04/06/16 1333)  cefTRIAXone (ROCEPHIN) injection 1 g (1 g Intramuscular Given 04/06/16 1332)  azithromycin (ZITHROMAX) tablet 1,000 mg (1,000 mg Oral Given 04/06/16 1330)  lidocaine (XYLOCAINE) 1 % (with pres)  injection (2.1 mLs  Given 04/06/16 1332)  albuterol (PROVENTIL,VENTOLIN) solution continuous neb (10 mg/hr Nebulization Given 04/06/16 1554)     Initial Impression / Assessment and Plan / ED Course  I have reviewed the triage vital signs and the nursing notes.  Pertinent labs & imaging results that were available during my care of the patient were reviewed by me and considered in my medical decision making (see chart for details).  Clinical Course    Meds ordered this encounter  Medications  . ePHEDrine-GuaiFENesin (PRIMATENE ASTHMA) 12.5-200 MG TABS    Sig: Take 2 tablets by mouth daily as needed (sob).  [name] HYDROcodone-acetaminophen (NORCO/VICODIN) 5-325 MG per tablet 2 tablet  . ipratropium-albuterol (DUONEB) 0.5-2.5 (3) MG/3ML nebulizer solution 3 mL  . predniSONE (DELTASONE) tablet 60 mg  . cefTRIAXone (ROCEPHIN) injection 1 g    Order Specific Question:   Antibiotic Indication:    Answer:   CAP  . azithromycin (ZITHROMAX) tablet 1,000 mg  . lidocaine (XYLOCAINE)  1 % (with pres) injection    Leona Singleton   : cabinet override  . albuterol (PROVENTIL,VENTOLIN) solution continuous neb  . potassium chloride SA (K-DUR,KLOR-CON) CR tablet 40 mEq  . albuterol (PROVENTIL HFA;VENTOLIN HFA) 108 (90 Base) MCG/ACT inhaler 2 puff  . predniSONE (DELTASONE) 10 MG tablet    Sig: 5,4,3,2,1 taper    Dispense:  15 tablet    Refill:  0    Order Specific Question:   Supervising Provider    Answer:   MILLER, BRIAN [[id number]]  . azithromycin (ZITHROMAX) 250 MG tablet    Sig: One tablet a day beginning on 9/25    Dispense:  4 tablet    Refill:  0    Order Specific Question:   Supervising Provider    Answer:   Noemi Chapel [[id number]]  . albuterol (PROVENTIL HFA;VENTOLIN HFA) 108 (90 Base) MCG/ACT inhaler    Sig: Inhale 2 puffs into the lungs every 4 (four) hours as needed for wheezing or shortness of breath.    Dispense:  1 Inhaler    Refill:  0    Order Specific Question:   Supervising Provider     Answer:   Noemi Chapel [[id number]]    Final Clinical Impressions(s) / ED Diagnoses   Final diagnoses:  Community acquired pneumonia  Asthma, moderate persistent, uncomplicated  Hypokalemia    New Prescriptions New Prescriptions   ALBUTEROL (PROVENTIL HFA;VENTOLIN HFA) 108 (90 BASE) MCG/ACT INHALER    Inhale 2 puffs into the lungs every 4 (four) hours as needed for wheezing or shortness of breath.   AZITHROMYCIN (ZITHROMAX) 250 MG TABLET    One tablet a day beginning on 9/25   PREDNISONE (DELTASONE) 10 MG TABLET    5,4,3,2,1 taper  An After Visit Summary was printed and given to the patient.   Hollace Kinnier Badin, PA-C 04/06/16 [place], MD 04/06/16 2009

## 2016-04-07 ENCOUNTER — Inpatient Hospital Stay (HOSPITAL_COMMUNITY)
Admission: EM | Admit: 2016-04-07 | Discharge: 2016-04-11 | DRG: 287 | Disposition: A | Discharge status: Home / Self Care | Payer: Self-pay | Attending: Internal Medicine | Admitting: Internal Medicine

## 2016-04-07 ENCOUNTER — Encounter (HOSPITAL_COMMUNITY): Payer: Self-pay

## 2016-04-07 ENCOUNTER — Emergency Department (HOSPITAL_COMMUNITY): Payer: Self-pay

## 2016-04-07 DIAGNOSIS — R911 Solitary pulmonary nodule: Secondary | ICD-10-CM | POA: Diagnosis present

## 2016-04-07 DIAGNOSIS — I5041 Acute combined systolic (congestive) and diastolic (congestive) heart failure: Secondary | ICD-10-CM | POA: Diagnosis present

## 2016-04-07 DIAGNOSIS — I5043 Acute on chronic combined systolic (congestive) and diastolic (congestive) heart failure: Secondary | ICD-10-CM | POA: Diagnosis present

## 2016-04-07 DIAGNOSIS — I080 Rheumatic disorders of both mitral and aortic valves: Secondary | ICD-10-CM | POA: Diagnosis present

## 2016-04-07 DIAGNOSIS — E876 Hypokalemia: Secondary | ICD-10-CM | POA: Diagnosis present

## 2016-04-07 DIAGNOSIS — I509 Heart failure, unspecified: Secondary | ICD-10-CM

## 2016-04-07 DIAGNOSIS — K219 Gastro-esophageal reflux disease without esophagitis: Secondary | ICD-10-CM | POA: Diagnosis present

## 2016-04-07 DIAGNOSIS — I110 Hypertensive heart disease with heart failure: Principal | ICD-10-CM | POA: Diagnosis present

## 2016-04-07 DIAGNOSIS — I351 Nonrheumatic aortic (valve) insufficiency: Secondary | ICD-10-CM | POA: Diagnosis present

## 2016-04-07 DIAGNOSIS — Z720 Tobacco use: Secondary | ICD-10-CM | POA: Diagnosis present

## 2016-04-07 DIAGNOSIS — R0602 Shortness of breath: Secondary | ICD-10-CM

## 2016-04-07 DIAGNOSIS — I428 Other cardiomyopathies: Secondary | ICD-10-CM

## 2016-04-07 DIAGNOSIS — Z0389 Encounter for observation for other suspected diseases and conditions ruled out: Secondary | ICD-10-CM

## 2016-04-07 DIAGNOSIS — IMO0001 Reserved for inherently not codable concepts without codable children: Secondary | ICD-10-CM

## 2016-04-07 DIAGNOSIS — I340 Nonrheumatic mitral (valve) insufficiency: Secondary | ICD-10-CM | POA: Diagnosis present

## 2016-04-07 DIAGNOSIS — F17210 Nicotine dependence, cigarettes, uncomplicated: Secondary | ICD-10-CM | POA: Diagnosis present

## 2016-04-07 DIAGNOSIS — F1010 Alcohol abuse, uncomplicated: Secondary | ICD-10-CM | POA: Diagnosis present

## 2016-04-07 HISTORY — DX: Tobacco use: Z72.0

## 2016-04-07 HISTORY — DX: Hypertensive heart disease with heart failure: I11.0

## 2016-04-07 HISTORY — DX: Alcohol abuse, uncomplicated: F10.10

## 2016-04-07 HISTORY — DX: Cardiomyopathy, unspecified: I42.9

## 2016-04-07 HISTORY — DX: Nonrheumatic aortic (valve) insufficiency: I35.1

## 2016-04-07 HISTORY — DX: Cannabis abuse, uncomplicated: F12.10

## 2016-04-07 HISTORY — DX: Nonrheumatic mitral (valve) insufficiency: I34.0

## 2016-04-07 LAB — CBC WITH DIFFERENTIAL/PLATELET
BASOS PCT: 0 %
Basophils Absolute: 0 10*3/uL (ref 0.0–0.1)
EOS ABS: 0 10*3/uL (ref 0.0–0.7)
EOS PCT: 0 %
HEMATOCRIT: 36.1 % (ref 36.0–46.0)
Hemoglobin: 12.8 g/dL (ref 12.0–15.0)
Lymphocytes Relative: 9 %
Lymphs Abs: 0.7 10*3/uL (ref 0.7–4.0)
MCH: 29.9 pg (ref 26.0–34.0)
MCHC: 35.5 g/dL (ref 30.0–36.0)
MCV: 84.3 fL (ref 78.0–100.0)
MONO ABS: 0.3 10*3/uL (ref 0.1–1.0)
MONOS PCT: 3 %
NEUTROS ABS: 7 10*3/uL (ref 1.7–7.7)
Neutrophils Relative %: 88 %
PLATELETS: 274 10*3/uL (ref 150–400)
RBC: 4.28 MIL/uL (ref 3.87–5.11)
RDW: 15.4 % (ref 11.5–15.5)
WBC: 8 10*3/uL (ref 4.0–10.5)

## 2016-04-07 LAB — BASIC METABOLIC PANEL
Anion gap: 10 (ref 5–15)
BUN: 9 mg/dL (ref 6–20)
CALCIUM: 9.3 mg/dL (ref 8.9–10.3)
CO2: 21 mmol/L — AB (ref 22–32)
CREATININE: 0.73 mg/dL (ref 0.44–1.00)
Chloride: 106 mmol/L (ref 101–111)
GLUCOSE: 135 mg/dL — AB (ref 65–99)
Potassium: 3.8 mmol/L (ref 3.5–5.1)
Sodium: 137 mmol/L (ref 135–145)

## 2016-04-07 LAB — I-STAT TROPONIN, ED: TROPONIN I, POC: 0.01 ng/mL (ref 0.00–0.08)

## 2016-04-07 LAB — BRAIN NATRIURETIC PEPTIDE: B NATRIURETIC PEPTIDE 5: 262.1 pg/mL — AB (ref 0.0–100.0)

## 2016-04-07 MED ORDER — FUROSEMIDE 10 MG/ML IJ SOLN
40.0000 mg | Freq: Once | INTRAMUSCULAR | Status: AC
  Administered 2016-04-07: 40 mg via INTRAVENOUS
  Filled 2016-04-07: qty 4

## 2016-04-07 MED ORDER — POTASSIUM CHLORIDE CRYS ER 20 MEQ PO TBCR
40.0000 meq | EXTENDED_RELEASE_TABLET | Freq: Once | ORAL | Status: AC
  Administered 2016-04-07: 40 meq via ORAL
  Filled 2016-04-07: qty 2

## 2016-04-07 MED ORDER — FUROSEMIDE 40 MG PO TABS
40.0000 mg | ORAL_TABLET | Freq: Once | ORAL | Status: DC
  Filled 2016-04-07: qty 1

## 2016-04-07 MED ORDER — IOPAMIDOL (ISOVUE-370) INJECTION 76%
100.0000 mL | Freq: Once | INTRAVENOUS | Status: AC | PRN
  Administered 2016-04-07: 100 mL via INTRAVENOUS

## 2016-04-07 NOTE — ED Notes (Signed)
20 min timer started

## 2016-04-07 NOTE — ED Notes (Signed)
Pt wants labs drawn from her IV

## 2016-04-07 NOTE — ED Provider Notes (Signed)
[facility] DEPT Provider Note   CSN: [id number] Arrival date & time: 04/07/16  1319     History   Chief Complaint Chief Complaint  Patient presents with  . Pneumonia    HPI Rachael Stone is a 50 y.o. female.  HPI   Patient is a 50 year old female with a history of HTN, GERD who presents the emergency department with worsening shortness of breath for the last 4 days. Patient was seen yesterday in the ED and diagnosed with atypical pneumonia. She is taking her antibiotics as prescribed. She states today at work she had worsening shortness of breath with exertion and worsening shortness of breath with lying flat. Patient has had shortness of breath for roughly 1 month of worsening over the last 7 days. Associated intermittent productive cough with brown sputum, myalgias in her quads bilaterally, and left-sided middle back pain only with movement not at rest. Patient denies chest pain, dizziness, visual changes, abdominal pain, nausea, vomiting, diarrhea. Patient states her mom died age 33 from MI.  Past Medical History:  Diagnosis Date  . Back pain     Patient Active Problem List   Diagnosis Date Noted  . Acute pulmonary edema ([place]) 04/08/2016  . Elevated blood pressure 04/08/2016  . CHF (congestive heart failure) ([place]) 04/07/2016  . VAGINAL DISCHARGE 09/10/2009  . CHEST PAIN UNSPECIFIED 08/13/2009  . DEPRESSIVE DISORDER NOT ELSEWHERE CLASSIFIED 12/14/2008  . MENORRHAGIA 12/14/2008  . NAUSEA, CHRONIC 12/14/2008  . ELEVATED BLOOD PRESSURE WITHOUT DIAGNOSIS OF HYPERTENSION 12/14/2008  . DYSFUNCTIONAL UTERINE BLEEDING 11/12/2007  . TOBACCO DEPENDENCE 09/10/2006  . GASTROESOPHAGEAL REFLUX, NO ESOPHAGITIS 09/10/2006    Past Surgical History:  Procedure Laterality Date  . EXPLORATORY LAPAROTOMY  2003   Ectopic Pregnancy - unsure which side or if tube was removed  . TUBAL LIGATION      OB History    Gravida Para Term Preterm AB Living   3 2 0 0 1 2   SAB TAB Ectopic  Multiple Live Births   0 0 1 0         Home Medications    Prior to Admission medications   Medication Sig Start Date End Date Taking? Authorizing Provider  albuterol (PROVENTIL HFA;VENTOLIN HFA) 108 (90 Base) MCG/ACT inhaler Inhale 2 puffs into the lungs every 4 (four) hours as needed for wheezing or shortness of breath. 04/06/16  Yes Hollace Kinnier Sofia, PA-C  azithromycin [facility]) 250 MG tablet One tablet a day beginning on 9/25 04/06/16  Yes Fransico Meadow, PA-C  ePHEDrine-GuaiFENesin (PRIMATENE ASTHMA) 12.5-200 MG TABS Take 2 tablets by mouth daily as needed (sob).   Yes Historical Provider, MD  predniSONE (DELTASONE) 10 MG tablet 5,4,3,2,1 taper Patient taking differently: Take 10-50 mg by mouth as directed. Day 1: Take 5 tabs, Day 2: Take 4 tabs, Day 3: Take 3 tabs, Day 2: Take 2 tabs,Day 1: Take 1 tab 04/06/16  Yes Fransico Meadow, PA-C    Family History Family History  Problem Relation Age of Onset  . CAD Mother     Social History Social History  Substance Use Topics  . Smoking status: Current Every Day Smoker    Packs/day: 6.00    Years: 15.00    Types: Cigarettes  . Smokeless tobacco: Never Used  . Alcohol use Yes     Comment: rarely     Allergies   Ibuprofen   Review of Systems Review of Systems  Constitutional: Positive for chills. Negative for fever.  Eyes: Negative for  visual disturbance.  Respiratory: Positive for cough and shortness of breath.   Cardiovascular: Negative for chest pain and leg swelling.  Gastrointestinal: Negative for abdominal pain, blood in stool, diarrhea, nausea and vomiting.  Genitourinary: Negative for dysuria and hematuria.  Musculoskeletal: Positive for back pain. Negative for neck pain.  Skin: Negative for rash.  Neurological: Negative for dizziness, syncope, weakness, light-headedness and headaches.     Physical Exam Updated Vital Signs BP 167/94 (BP Location: Left Arm)   Pulse 105   Temp 97.7 F (36.5 C) (Oral)   Resp  18   LMP 03/14/2016 (Approximate)   SpO2 99%   Physical Exam  Constitutional: She appears well-developed and well-nourished. No distress.  HENT:  Head: Normocephalic and atraumatic.  Eyes: Conjunctivae are normal.  Neck: Trachea normal, normal range of motion and full passive range of motion without pain. Neck supple. No JVD present.  Cardiovascular: Regular rhythm.  Tachycardia present.   Murmur heard.  Diastolic murmur is present  Pulses:      Radial pulses are 2+ on the right side, and 2+ on the left side.       Dorsalis pedis pulses are 2+ on the right side, and 2+ on the left side.  Pulmonary/Chest: Effort normal. No respiratory distress. She has no decreased breath sounds. She has no wheezes. She has no rhonchi. She has rales in the right lower field and the left lower field.  Abdominal: Normal appearance and bowel sounds are normal. She exhibits no distension. There is no tenderness.  Musculoskeletal: Normal range of motion. She exhibits no edema.  Neurological: She is alert. Coordination normal.  Skin: Skin is warm and dry. She is not diaphoretic.  Psychiatric: She has a normal mood and affect. Her behavior is normal.  Nursing note and vitals reviewed.     ED Treatments / Results  Labs (all labs ordered are listed, but only abnormal results are displayed) Labs Reviewed  BRAIN NATRIURETIC PEPTIDE - Abnormal; Notable for the following:       Result Value   B Natriuretic Peptide 262.1 (*)    All other components within normal limits  BASIC METABOLIC PANEL - Abnormal; Notable for the following:    CO2 21 (*)    Glucose, Bld 135 (*)    All other components within normal limits  URINE RAPID DRUG SCREEN, HOSP PERFORMED - Abnormal; Notable for the following:    Tetrahydrocannabinol POSITIVE (*)    All other components within normal limits  CBC WITH DIFFERENTIAL/PLATELET  COMPREHENSIVE METABOLIC PANEL  TSH  CBC WITH DIFFERENTIAL/PLATELET  URINALYSIS, ROUTINE W REFLEX  MICROSCOPIC (NOT AT [facility])  TROPONIN I  TROPONIN I  TROPONIN I  I-STAT TROPOININ, ED    EKG  EKG Interpretation None       Radiology Dg Chest 2 View  Result Date: 04/06/2016 CLINICAL DATA:  Shortness of breath EXAM: CHEST  2 VIEW COMPARISON:  01/07/2016 chest radiograph. FINDINGS: Stable cardiomediastinal silhouette with top-normal heart size. No pneumothorax. No pleural effusion. There is diffuse prominence of the parahilar interstitial markings, worsened. No acute consolidative airspace disease. IMPRESSION: Interval worsening of diffuse prominence of the parahilar interstitial markings with top-normal heart size. Differential includes interstitial lung disease or atypical infection. Electronically Signed   By: Ilona Sorrel M.D.   On: 04/06/2016 12:07   Ct Angio Chest Pe W And/or Wo Contrast  Result Date: 04/07/2016 CLINICAL DATA:  Shortness of breath. EXAM: CT ANGIOGRAPHY CHEST WITH CONTRAST TECHNIQUE: Multidetector CT imaging of the chest  was performed using the standard protocol during bolus administration of intravenous contrast. Multiplanar CT image reconstructions and MIPs were obtained to evaluate the vascular anatomy. CONTRAST:  100 cc of Isovue 370 COMPARISON:  None. FINDINGS: Cardiovascular: Satisfactory opacification of the pulmonary arteries to the segmental level. No evidence of pulmonary embolism. Normal heart size. No pericardial effusion. Normal heart size. No pericardial effusion. Mediastinum/Nodes: The trachea appears patent and is midline. Normal appearance of the esophagus. Enlarged mediastinal and hilar lymph nodes noted. Index right hilar node measures 1.5 cm, image 144 of series 11. Index sub- carinal node measures 1.5 cm, image 132 of series 11. Index right paratracheal node measures 1.5 cm, image 85 of series 11. Lungs/Pleura: Moderate bilateral pleural effusions identified. There is moderate diffuse interstitial thickening and ground-glass attenuation compatible with  pulmonary edema. Subpleural nodule left upper lobe measures 7 x 10 mm (mean diameter 9 mm), image 22 of series 12. Upper Abdomen: No acute abnormality. Musculoskeletal: No aggressive lytic or sclerotic bone lesions. Review of the MIP images confirms the above findings. IMPRESSION: 1. No evidence for acute pulmonary embolus. 2. Pleural effusions and pulmonary edema is identified suspicious for CHF. 3. Left upper lobe subpleural nodule measures 9 mm. Consider one of the following in 3 months for both low-risk and high-risk individuals: (a) repeat chest CT, (b) follow-up PET-CT, or (c) tissue sampling. This recommendation follows the consensus statement: Guidelines for Management of Incidental Pulmonary Nodules Detected on CT Images: From the Fleischner Society 2017; Radiology 2017; 284:228-243. 4. Enlarged mediastinal and right hilar nodes. Attention on follow-up imaging is recommended. Electronically Signed   By: Kerby Moors M.D.   On: 04/07/2016 21:16    Procedures Procedures (including critical care time)  Medications Ordered in ED Medications  Influenza vac split quadrivalent PF (FLUARIX) injection 0.5 mL (not administered)  LORazepam (ATIVAN) tablet 1 mg (not administered)    Or  LORazepam (ATIVAN) injection 1 mg (not administered)  thiamine (VITAMIN B-1) tablet 100 mg (not administered)    Or  thiamine (B-1) injection 100 mg (not administered)  folic acid (FOLVITE) tablet 1 mg (not administered)  multivitamin with minerals tablet 1 tablet (not administered)  acetaminophen (TYLENOL) tablet 650 mg (not administered)    Or  acetaminophen (TYLENOL) suppository 650 mg (not administered)  ondansetron (ZOFRAN) tablet 4 mg (not administered)    Or  ondansetron (ZOFRAN) injection 4 mg (not administered)  LORazepam (ATIVAN) tablet 0-4 mg (0 mg Oral Not Given 04/08/16 0118)    Followed by  LORazepam (ATIVAN) tablet 0-4 mg (not administered)  furosemide (LASIX) injection 40 mg (not administered)    lisinopril (PRINIVIL,ZESTRIL) tablet 2.5 mg (2.5 mg Oral Given 04/08/16 0115)  enoxaparin (LOVENOX) injection 40 mg (not administered)  potassium chloride (K-DUR,KLOR-CON) CR tablet 10 mEq (not administered)  nicotine (NICODERM CQ - dosed in mg/24 hours) patch 14 mg (not administered)  iopamidol (ISOVUE-370) 76 % injection 100 mL (100 mLs Intravenous Contrast Given 04/07/16 2022)  furosemide (LASIX) injection 40 mg (40 mg Intravenous Given 04/07/16 2354)  potassium chloride SA (K-DUR,KLOR-CON) CR tablet 40 mEq (40 mEq Oral Given 04/07/16 2310)     Initial Impression / Assessment and Plan / ED Course  I have reviewed the triage vital signs and the nursing notes.  Pertinent labs & imaging results that were available during my care of the patient were reviewed by me and considered in my medical decision making (see chart for details).  Clinical Course   Patient with shortness of breath on  exertion and with lying flat for one month worsening in the last 7 days. Patient diagnosed with pneumonia last night in the ED. Elevated BNP. CT chest reviewed by me revealed pleural effusions and pulmonary edema suggesting congestive heart failure. Patient with diastolic heart murmur on exam. Consult the hospitalist team for admission for new onset congestive heart failure.  Spoke with Dr. Hal Hope who will admit the pt for further evaluation and treatment.   Thank you Dr. Hal Hope for your consult, time and care of this pt.  Pt case discussed and pt seen by Dr. Zenia Resides who agrees with the above plan.  Final Clinical Impressions(s) / ED Diagnoses   Final diagnoses:  Shortness of breath  Congestive heart failure, unspecified congestive heart failure chronicity, unspecified congestive heart failure type [facility])    New Prescriptions Current Discharge Medication List       Kalman Drape, [place] 04/08/16 0131    Lacretia Leigh, MD 04/14/16 Lurena Nida

## 2016-04-07 NOTE — ED Triage Notes (Signed)
Pt told yesterday she had pneumonia and treated and sent home. Pt given home antibiotics.  Pt states shortness of breath not getting better.

## 2016-04-08 ENCOUNTER — Inpatient Hospital Stay (HOSPITAL_COMMUNITY): Payer: Self-pay

## 2016-04-08 ENCOUNTER — Encounter (HOSPITAL_COMMUNITY): Payer: Self-pay | Admitting: Internal Medicine

## 2016-04-08 DIAGNOSIS — IMO0001 Reserved for inherently not codable concepts without codable children: Secondary | ICD-10-CM | POA: Insufficient documentation

## 2016-04-08 DIAGNOSIS — R0600 Dyspnea, unspecified: Secondary | ICD-10-CM

## 2016-04-08 DIAGNOSIS — J810 Acute pulmonary edema: Secondary | ICD-10-CM | POA: Insufficient documentation

## 2016-04-08 DIAGNOSIS — R030 Elevated blood-pressure reading, without diagnosis of hypertension: Secondary | ICD-10-CM

## 2016-04-08 LAB — CBC WITH DIFFERENTIAL/PLATELET
BASOS ABS: 0 10*3/uL (ref 0.0–0.1)
Basophils Relative: 0 %
Eosinophils Absolute: 0 10*3/uL (ref 0.0–0.7)
Eosinophils Relative: 0 %
HEMATOCRIT: 39.7 % (ref 36.0–46.0)
HEMOGLOBIN: 13.7 g/dL (ref 12.0–15.0)
LYMPHS ABS: 2.4 10*3/uL (ref 0.7–4.0)
Lymphocytes Relative: 23 %
MCH: 29.3 pg (ref 26.0–34.0)
MCHC: 34.5 g/dL (ref 30.0–36.0)
MCV: 85 fL (ref 78.0–100.0)
Monocytes Absolute: 0.8 10*3/uL (ref 0.1–1.0)
Monocytes Relative: 8 %
NEUTROS ABS: 7.3 10*3/uL (ref 1.7–7.7)
Neutrophils Relative %: 69 %
Platelets: 268 10*3/uL (ref 150–400)
RBC: 4.67 MIL/uL (ref 3.87–5.11)
RDW: 15.2 % (ref 11.5–15.5)
WBC: 10.5 10*3/uL (ref 4.0–10.5)

## 2016-04-08 LAB — LACTIC ACID, PLASMA: LACTIC ACID, VENOUS: 1.2 mmol/L (ref 0.5–1.9)

## 2016-04-08 LAB — RAPID URINE DRUG SCREEN, HOSP PERFORMED
Amphetamines: NOT DETECTED
Barbiturates: NOT DETECTED
Benzodiazepines: NOT DETECTED
Cocaine: NOT DETECTED
OPIATES: NOT DETECTED
TETRAHYDROCANNABINOL: POSITIVE — AB

## 2016-04-08 LAB — URINALYSIS, ROUTINE W REFLEX MICROSCOPIC
Bilirubin Urine: NEGATIVE
Glucose, UA: NEGATIVE mg/dL
Ketones, ur: NEGATIVE mg/dL
LEUKOCYTES UA: NEGATIVE
NITRITE: NEGATIVE
Protein, ur: NEGATIVE mg/dL
Specific Gravity, Urine: 1.008 (ref 1.005–1.030)
pH: 5.5 (ref 5.0–8.0)

## 2016-04-08 LAB — URINE MICROSCOPIC-ADD ON
Bacteria, UA: NONE SEEN
WBC UA: NONE SEEN WBC/hpf (ref 0–5)

## 2016-04-08 LAB — COMPREHENSIVE METABOLIC PANEL
ALT: 22 U/L (ref 14–54)
AST: 29 U/L (ref 15–41)
Albumin: 4.1 g/dL (ref 3.5–5.0)
Alkaline Phosphatase: 56 U/L (ref 38–126)
Anion gap: 10 (ref 5–15)
BUN: 8 mg/dL (ref 6–20)
CHLORIDE: 105 mmol/L (ref 101–111)
CO2: 22 mmol/L (ref 22–32)
Calcium: 9.1 mg/dL (ref 8.9–10.3)
Creatinine, Ser: 0.85 mg/dL (ref 0.44–1.00)
Glucose, Bld: 99 mg/dL (ref 65–99)
POTASSIUM: 2.9 mmol/L — AB (ref 3.5–5.1)
Sodium: 137 mmol/L (ref 135–145)
Total Bilirubin: 0.8 mg/dL (ref 0.3–1.2)
Total Protein: 7.9 g/dL (ref 6.5–8.1)

## 2016-04-08 LAB — TROPONIN I

## 2016-04-08 LAB — TSH: TSH: 0.524 u[IU]/mL (ref 0.350–4.500)

## 2016-04-08 LAB — ECHOCARDIOGRAM COMPLETE
Height: 65.5 in
Weight: 2440 oz

## 2016-04-08 MED ORDER — POTASSIUM CHLORIDE CRYS ER 20 MEQ PO TBCR
40.0000 meq | EXTENDED_RELEASE_TABLET | Freq: Once | ORAL | Status: AC
  Administered 2016-04-08: 40 meq via ORAL
  Filled 2016-04-08: qty 2

## 2016-04-08 MED ORDER — ACETAMINOPHEN 325 MG PO TABS
650.0000 mg | ORAL_TABLET | Freq: Four times a day (QID) | ORAL | Status: DC | PRN
  Administered 2016-04-08 (×3): 650 mg via ORAL
  Filled 2016-04-08 (×3): qty 2

## 2016-04-08 MED ORDER — FOLIC ACID 1 MG PO TABS
1.0000 mg | ORAL_TABLET | Freq: Every day | ORAL | Status: DC
  Administered 2016-04-08 – 2016-04-11 (×3): 1 mg via ORAL
  Filled 2016-04-08 (×3): qty 1

## 2016-04-08 MED ORDER — NICOTINE 14 MG/24HR TD PT24
14.0000 mg | MEDICATED_PATCH | Freq: Every day | TRANSDERMAL | Status: DC
  Administered 2016-04-08 – 2016-04-11 (×4): 14 mg via TRANSDERMAL
  Filled 2016-04-08 (×4): qty 1

## 2016-04-08 MED ORDER — ONDANSETRON HCL 4 MG/2ML IJ SOLN
4.0000 mg | Freq: Four times a day (QID) | INTRAMUSCULAR | Status: DC | PRN
Start: 2016-04-08 — End: 2016-04-11

## 2016-04-08 MED ORDER — LORAZEPAM 1 MG PO TABS
0.0000 mg | ORAL_TABLET | Freq: Four times a day (QID) | ORAL | Status: AC
  Administered 2016-04-09: 1 mg via ORAL
  Filled 2016-04-08: qty 1

## 2016-04-08 MED ORDER — ALBUTEROL SULFATE (2.5 MG/3ML) 0.083% IN NEBU
5.0000 mg | INHALATION_SOLUTION | Freq: Four times a day (QID) | RESPIRATORY_TRACT | Status: DC | PRN
  Administered 2016-04-08: 5 mg via RESPIRATORY_TRACT
  Filled 2016-04-08 (×2): qty 6

## 2016-04-08 MED ORDER — LISINOPRIL 2.5 MG PO TABS
2.5000 mg | ORAL_TABLET | Freq: Every day | ORAL | Status: DC
  Administered 2016-04-08 – 2016-04-09 (×3): 2.5 mg via ORAL
  Filled 2016-04-08 (×3): qty 1

## 2016-04-08 MED ORDER — INFLUENZA VAC SPLIT QUAD 0.5 ML IM SUSY
0.5000 mL | PREFILLED_SYRINGE | INTRAMUSCULAR | Status: AC
  Administered 2016-04-09: 0.5 mL via INTRAMUSCULAR
  Filled 2016-04-08: qty 0.5

## 2016-04-08 MED ORDER — ACETAMINOPHEN 650 MG RE SUPP: 650.0000 mg | Freq: Four times a day (QID) | RECTAL | Status: DC | PRN

## 2016-04-08 MED ORDER — ALBUTEROL SULFATE (2.5 MG/3ML) 0.083% IN NEBU
2.5000 mg | INHALATION_SOLUTION | Freq: Four times a day (QID) | RESPIRATORY_TRACT | Status: DC | PRN
  Administered 2016-04-08 (×2): 2.5 mg via RESPIRATORY_TRACT
  Filled 2016-04-08: qty 3

## 2016-04-08 MED ORDER — POTASSIUM CHLORIDE CRYS ER 10 MEQ PO TBCR
10.0000 meq | EXTENDED_RELEASE_TABLET | Freq: Every day | ORAL | Status: DC
  Administered 2016-04-08 – 2016-04-11 (×4): 10 meq via ORAL
  Filled 2016-04-08 (×4): qty 1

## 2016-04-08 MED ORDER — FUROSEMIDE 10 MG/ML IJ SOLN
40.0000 mg | Freq: Every day | INTRAMUSCULAR | Status: DC
  Administered 2016-04-08 – 2016-04-09 (×2): 40 mg via INTRAVENOUS
  Filled 2016-04-08 (×2): qty 4

## 2016-04-08 MED ORDER — ENOXAPARIN SODIUM 40 MG/0.4ML ~~LOC~~ SOLN
40.0000 mg | SUBCUTANEOUS | Status: DC
  Administered 2016-04-08 – 2016-04-09 (×2): 40 mg via SUBCUTANEOUS
  Filled 2016-04-08 (×2): qty 0.4

## 2016-04-08 MED ORDER — METHYLPREDNISOLONE SODIUM SUCC 125 MG IJ SOLR
60.0000 mg | Freq: Two times a day (BID) | INTRAMUSCULAR | Status: DC
  Administered 2016-04-08 – 2016-04-09 (×3): 60 mg via INTRAVENOUS
  Filled 2016-04-08 (×3): qty 2

## 2016-04-08 MED ORDER — LORAZEPAM 1 MG PO TABS: 0.0000 mg | ORAL_TABLET | Freq: Two times a day (BID) | ORAL | Status: DC

## 2016-04-08 MED ORDER — HYDRALAZINE HCL 20 MG/ML IJ SOLN: 10.0000 mg | Freq: Four times a day (QID) | INTRAMUSCULAR | Status: DC | PRN

## 2016-04-08 MED ORDER — LORAZEPAM 1 MG PO TABS
1.0000 mg | ORAL_TABLET | Freq: Four times a day (QID) | ORAL | Status: DC | PRN
  Administered 2016-04-10: 1 mg via ORAL
  Filled 2016-04-08: qty 1

## 2016-04-08 MED ORDER — LORAZEPAM 2 MG/ML IJ SOLN: 1.0000 mg | Freq: Four times a day (QID) | INTRAMUSCULAR | Status: DC | PRN

## 2016-04-08 MED ORDER — THIAMINE HCL 100 MG/ML IJ SOLN
100.0000 mg | Freq: Every day | INTRAMUSCULAR | Status: DC
  Filled 2016-04-08: qty 2

## 2016-04-08 MED ORDER — ADULT MULTIVITAMIN W/MINERALS CH
1.0000 | ORAL_TABLET | Freq: Every day | ORAL | Status: DC
  Administered 2016-04-08 – 2016-04-11 (×3): 1 via ORAL
  Filled 2016-04-08 (×3): qty 1

## 2016-04-08 MED ORDER — ONDANSETRON HCL 4 MG PO TABS: 4.0000 mg | ORAL_TABLET | Freq: Four times a day (QID) | ORAL | Status: DC | PRN

## 2016-04-08 MED ORDER — VITAMIN B-1 100 MG PO TABS
100.0000 mg | ORAL_TABLET | Freq: Every day | ORAL | Status: DC
  Administered 2016-04-08 – 2016-04-11 (×3): 100 mg via ORAL
  Filled 2016-04-08 (×3): qty 1

## 2016-04-08 NOTE — H&P (Signed)
History and Physical    Rachael Stone PJA:[id number] DOB: 1965/12/13 DOA: 04/07/2016  PCP: No PCP Per Patient  Patient coming from: Home.  Chief Complaint: Shortness of breath.  HPI: Rachael Stone is a 50 y.o. female with no significant past medical history presents to the ER because of persistent shortness of breath. Patient has been having shortness of breath for last 1 month. Patient states she gets short of breath on exertion and on lying flat. Patient had come to the ER last month and also 2 days ago when patient was treated empirically for bronchitis. Despite which patient is still short of breath. CT angiogram of the chest done shows pleural effusion and pulmonary edema. Patient is being admitted for acute pulmonary edema. Patient on exam is not in distress. Denies any chest pain. Has been having some wheezing off and on but on my exam patient does not have any obvious wheezes. JVD is elevated.   ED Course: Patient was given Lasix 40 mg IV.  Review of Systems: As per HPI, rest all negative.   Past Medical History:  Diagnosis Date  . Back pain     Past Surgical History:  Procedure Laterality Date  . EXPLORATORY LAPAROTOMY  2003   Ectopic Pregnancy - unsure which side or if tube was removed  . TUBAL LIGATION       reports that she has been smoking Cigarettes.  She has a 90.00 pack-year smoking history. She has never used smokeless tobacco. She reports that she drinks alcohol. She reports that she uses drugs, including Marijuana.  Allergies  Allergen Reactions  . Ibuprofen     Family History  Problem Relation Age of Onset  . CAD Mother     Prior to Admission medications   Medication Sig Start Date End Date Taking? Authorizing Provider  albuterol (PROVENTIL HFA;VENTOLIN HFA) 108 (90 Base) MCG/ACT inhaler Inhale 2 puffs into the lungs every 4 (four) hours as needed for wheezing or shortness of breath. 04/06/16  Yes Hollace Kinnier Sofia, PA-C  azithromycin [facility])  250 MG tablet One tablet a day beginning on 9/25 04/06/16  Yes Fransico Meadow, PA-C  ePHEDrine-GuaiFENesin (PRIMATENE ASTHMA) 12.5-200 MG TABS Take 2 tablets by mouth daily as needed (sob).   Yes Historical Provider, MD  predniSONE (DELTASONE) 10 MG tablet 5,4,3,2,1 taper Patient taking differently: Take 10-50 mg by mouth as directed. Day 1: Take 5 tabs, Day 2: Take 4 tabs, Day 3: Take 3 tabs, Day 2: Take 2 tabs,Day 1: Take 1 tab 04/06/16  Yes Fransico Meadow, PA-C    Physical Exam: Vitals:   04/07/16 1853 04/07/16 2058 04/07/16 2302 04/07/16 2340  BP: 167/94 154/95 150/88 (!) 147/80  Pulse: 105 103 88 94  Resp: '18 19 17 18  '$ Temp:  98.7 F (37.1 C)  97.9 F (36.6 C)  TempSrc:  Oral  Oral  SpO2: 99% 95% 95% 93%  Weight:    156 lb 6.4 oz (70.9 kg)  Height:    5' 5.5" (1.664 m)      Constitutional: Not in distress. Vitals:   04/07/16 1853 04/07/16 2058 04/07/16 2302 04/07/16 2340  BP: 167/94 154/95 150/88 (!) 147/80  Pulse: 105 103 88 94  Resp: '18 19 17 18  '$ Temp:  98.7 F (37.1 C)  97.9 F (36.6 C)  TempSrc:  Oral  Oral  SpO2: 99% 95% 95% 93%  Weight:    156 lb 6.4 oz (70.9 kg)  Height:    5'  5.5" (1.664 m)   Eyes: Anicteric no pallor. ENMT: No discharge from the ears eyes nose or mouth. Neck: JVD elevated. Respiratory: No rhonchi or crepitations. Cardiovascular: S1 and S2 heard. Abdomen: Soft nontender bowel sounds present. No guarding or rigidity. Musculoskeletal: No edema. Skin: No rash. Neurologic: Alert awake oriented to time place and person. Moves all extremities. Psychiatric: Appears normal.   Labs on Admission: I have personally reviewed following labs and imaging studies  CBC:  Recent Labs Lab 04/06/16 1205 04/07/16 2051  WBC 7.5 8.0  NEUTROABS  --  7.0  HGB 12.8 12.8  HCT 38.3 36.1  MCV 87.2 84.3  PLT 253 188   Basic Metabolic Panel:  Recent Labs Lab 04/06/16 1205 04/07/16 2147  NA 137 137  K 2.9* 3.8  CL 106 106  CO2 24 21*  GLUCOSE 91  135*  BUN 10 9  CREATININE 0.85 0.73  CALCIUM 8.6* 9.3   GFR: Estimated Creatinine Clearance: 85.9 mL/min (by C-G formula based on SCr of 0.73 mg/dL). Liver Function Tests: No results for input(s): AST, ALT, ALKPHOS, BILITOT, PROT, ALBUMIN in the last 168 hours. No results for input(s): LIPASE, AMYLASE in the last 168 hours. No results for input(s): AMMONIA in the last 168 hours. Coagulation Profile: No results for input(s): INR, PROTIME in the last 168 hours. Cardiac Enzymes:  Recent Labs Lab 04/06/16 1205  TROPONINI <0.03   BNP (last 3 results) No results for input(s): PROBNP in the last 8760 hours. HbA1C: No results for input(s): HGBA1C in the last 72 hours. CBG: No results for input(s): GLUCAP in the last 168 hours. Lipid Profile: No results for input(s): CHOL, HDL, LDLCALC, TRIG, CHOLHDL, LDLDIRECT in the last 72 hours. Thyroid Function Tests: No results for input(s): TSH, T4TOTAL, FREET4, T3FREE, THYROIDAB in the last 72 hours. Anemia Panel: No results for input(s): VITAMINB12, FOLATE, FERRITIN, TIBC, IRON, RETICCTPCT in the last 72 hours. Urine analysis:    Component Value Date/Time   COLORURINE YELLOW 04/11/2013 0308   APPEARANCEUR CLOUDY (A) 04/11/2013 0308   LABSPEC 1.016 04/11/2013 0308   PHURINE 6.5 04/11/2013 0308   GLUCOSEU NEGATIVE 04/11/2013 0308   HGBUR LARGE (A) 04/11/2013 0308   BILIRUBINUR NEGATIVE 04/11/2013 0308   KETONESUR NEGATIVE 04/11/2013 0308   PROTEINUR NEGATIVE 04/11/2013 0308   UROBILINOGEN 0.2 04/11/2013 0308   NITRITE NEGATIVE 04/11/2013 0308   LEUKOCYTESUR NEGATIVE 04/11/2013 0308   Sepsis Labs: '@LABRCNTIP'$ (procalcitonin:4,lacticidven:4) )No results found for this or any previous visit (from the past 240 hour(s)).   Radiological Exams on Admission: Dg Chest 2 View  Result Date: 04/06/2016 CLINICAL DATA:  Shortness of breath EXAM: CHEST  2 VIEW COMPARISON:  01/07/2016 chest radiograph. FINDINGS: Stable cardiomediastinal silhouette  with top-normal heart size. No pneumothorax. No pleural effusion. There is diffuse prominence of the parahilar interstitial markings, worsened. No acute consolidative airspace disease. IMPRESSION: Interval worsening of diffuse prominence of the parahilar interstitial markings with top-normal heart size. Differential includes interstitial lung disease or atypical infection. Electronically Signed   By: Ilona Sorrel M.D.   On: 04/06/2016 12:07   Ct Angio Chest Pe W And/or Wo Contrast  Result Date: 04/07/2016 CLINICAL DATA:  Shortness of breath. EXAM: CT ANGIOGRAPHY CHEST WITH CONTRAST TECHNIQUE: Multidetector CT imaging of the chest was performed using the standard protocol during bolus administration of intravenous contrast. Multiplanar CT image reconstructions and MIPs were obtained to evaluate the vascular anatomy. CONTRAST:  100 cc of Isovue 370 COMPARISON:  None. FINDINGS: Cardiovascular: Satisfactory opacification of the pulmonary  arteries to the segmental level. No evidence of pulmonary embolism. Normal heart size. No pericardial effusion. Normal heart size. No pericardial effusion. Mediastinum/Nodes: The trachea appears patent and is midline. Normal appearance of the esophagus. Enlarged mediastinal and hilar lymph nodes noted. Index right hilar node measures 1.5 cm, image 144 of series 11. Index sub- carinal node measures 1.5 cm, image 132 of series 11. Index right paratracheal node measures 1.5 cm, image 85 of series 11. Lungs/Pleura: Moderate bilateral pleural effusions identified. There is moderate diffuse interstitial thickening and ground-glass attenuation compatible with pulmonary edema. Subpleural nodule left upper lobe measures 7 x 10 mm (mean diameter 9 mm), image 22 of series 12. Upper Abdomen: No acute abnormality. Musculoskeletal: No aggressive lytic or sclerotic bone lesions. Review of the MIP images confirms the above findings. IMPRESSION: 1. No evidence for acute pulmonary embolus. 2. Pleural  effusions and pulmonary edema is identified suspicious for CHF. 3. Left upper lobe subpleural nodule measures 9 mm. Consider one of the following in 3 months for both low-risk and high-risk individuals: (a) repeat chest CT, (b) follow-up PET-CT, or (c) tissue sampling. This recommendation follows the consensus statement: Guidelines for Management of Incidental Pulmonary Nodules Detected on CT Images: From the Fleischner Society 2017; Radiology 2017; 284:228-243. 4. Enlarged mediastinal and right hilar nodes. Attention on follow-up imaging is recommended. Electronically Signed   By: Kerby Moors M.D.   On: 04/07/2016 21:16    EKG: Independently reviewed. Normal sinus rhythm with nonspecific ST changes in the inferolateral leads. Will repeat on the EKG. Left atrial enlargement.  Assessment/Plan Principal Problem:   Acute pulmonary edema (HCC) Active Problems:   TOBACCO DEPENDENCE   CHF (congestive heart failure) (HCC)   Elevated blood pressure    1. Acute pulmonary edema - EF unknown. I have ordered 2-D echo. I have continued patient on Lasix 40 mg IV daily. I have also added lisinopril 2.5 mg because of elevated blood pressure and possible CHF. Closely follow daily weights intake output and metabolic panel. Check TSH cycle cardiac markers,UDS. 2. Elevated blood pressure - most likely undiagnosed hypertension probably contributing to #1. For now lisinopril 2.5 mg has been added. I have also place patient on when necessary IV hydralazine. Closely follow blood pressure trends. 3. Lung nodule - will need close follow-up as outpatient since patient is tobacco user. 4. Alcohol abuse - patient states patient drinks liquor almost everyday. Patient is placed on CIWA protocol. 5. Tobacco abuse - patient advised to quit smoking and drinking.   Pregnancy screen pending.   DVT prophylaxis: Lovenox. Code Status: Full code.  Family Communication: Discussed with patient.  Disposition Plan: Home.    Consults called: None.  Admission status: Inpatient. Likely stay 2-3 days.    Rise Patience MD [facility] Pager [phone].  If 7PM-7AM, please contact night-coverage [email or web address] Password [facility]  04/08/2016, 12:32 AM

## 2016-04-08 NOTE — Progress Notes (Signed)
*  PRELIMINARY RESULTS* Echocardiogram 2D Echocardiogram has been performed.  Leavy Cella 04/08/2016, 11:35 AM

## 2016-04-08 NOTE — Progress Notes (Addendum)
Patient ID: Rachael Stone, female   DOB: 1965/12/01, 50 y.o.   MRN: [id number]   50 y.o. female with no significant past medical history who presented to [facility] ED with worsening shortness of breath and non productive cough ongoing for past 1 month. She has been treated meanwhile with azithromycin and albuterol inhaler. Her symptoms improved slightly however not significantly. Patient reports shortness of breath worse with exertion. She is also short of breath when lying down.  Patient was hemodynamically stable on the admission. Oxygen saturation was 93% on room air. Chest x-ray showed possible interstitial lung disease. CT angiogram of the chest showed pleural effusion and pulmonary edema concerning for CHF. Patient was given Lasix IV on the admission.  Assessment and plan:  Dyspnea - Unclear etiology, either CHF versus interstitial lung disease - CT angiogram of the chest concerning for CHF but chest x-ray suspicious for interstitial lung disease - Patient has coarse breath sounds on physical exam - She has been given Lasix IV in ED and one dose today. She says she feels maybe a little bit better but she still has shortness of breath - We'll try Solu-Medrol 60 mg IV every 12 hours - If there is no significant improvement we'll see if there is a way to do diagnostic thoracentesis  Hypokalemia - Secondary to nebulizer treatments - Supplemented  Leisa Lenz [facility] [phone]

## 2016-04-09 ENCOUNTER — Encounter (HOSPITAL_COMMUNITY): Payer: Self-pay | Admitting: Nurse Practitioner

## 2016-04-09 DIAGNOSIS — I428 Other cardiomyopathies: Secondary | ICD-10-CM

## 2016-04-09 DIAGNOSIS — J810 Acute pulmonary edema: Secondary | ICD-10-CM

## 2016-04-09 DIAGNOSIS — I110 Hypertensive heart disease with heart failure: Secondary | ICD-10-CM | POA: Diagnosis present

## 2016-04-09 DIAGNOSIS — I5022 Chronic systolic (congestive) heart failure: Secondary | ICD-10-CM

## 2016-04-09 DIAGNOSIS — E876 Hypokalemia: Secondary | ICD-10-CM

## 2016-04-09 DIAGNOSIS — F17200 Nicotine dependence, unspecified, uncomplicated: Secondary | ICD-10-CM

## 2016-04-09 DIAGNOSIS — I429 Cardiomyopathy, unspecified: Secondary | ICD-10-CM

## 2016-04-09 DIAGNOSIS — I351 Nonrheumatic aortic (valve) insufficiency: Secondary | ICD-10-CM | POA: Diagnosis present

## 2016-04-09 DIAGNOSIS — Z720 Tobacco use: Secondary | ICD-10-CM | POA: Diagnosis present

## 2016-04-09 DIAGNOSIS — I340 Nonrheumatic mitral (valve) insufficiency: Secondary | ICD-10-CM | POA: Diagnosis present

## 2016-04-09 LAB — BASIC METABOLIC PANEL
ANION GAP: 12 (ref 5–15)
BUN: 15 mg/dL (ref 6–20)
CHLORIDE: 104 mmol/L (ref 101–111)
CO2: 21 mmol/L — ABNORMAL LOW (ref 22–32)
Calcium: 9.1 mg/dL (ref 8.9–10.3)
Creatinine, Ser: 0.9 mg/dL (ref 0.44–1.00)
Glucose, Bld: 122 mg/dL — ABNORMAL HIGH (ref 65–99)
POTASSIUM: 3.8 mmol/L (ref 3.5–5.1)
SODIUM: 137 mmol/L (ref 135–145)

## 2016-04-09 MED ORDER — SODIUM CHLORIDE 0.9% FLUSH: 3.0000 mL | INTRAVENOUS | Status: DC | PRN

## 2016-04-09 MED ORDER — CARVEDILOL 3.125 MG PO TABS
3.1250 mg | ORAL_TABLET | Freq: Two times a day (BID) | ORAL | Status: DC
  Administered 2016-04-09 – 2016-04-11 (×3): 3.125 mg via ORAL
  Filled 2016-04-09 (×3): qty 1

## 2016-04-09 MED ORDER — LOSARTAN POTASSIUM 50 MG PO TABS
25.0000 mg | ORAL_TABLET | Freq: Every day | ORAL | Status: DC
  Administered 2016-04-10 – 2016-04-11 (×2): 25 mg via ORAL
  Filled 2016-04-09 (×2): qty 1

## 2016-04-09 MED ORDER — SODIUM CHLORIDE 0.9% FLUSH
3.0000 mL | Freq: Two times a day (BID) | INTRAVENOUS | Status: DC
  Administered 2016-04-09: 3 mL via INTRAVENOUS

## 2016-04-09 MED ORDER — SODIUM CHLORIDE 0.9 % IV SOLN: 250.0000 mL | INTRAVENOUS | Status: DC | PRN

## 2016-04-09 MED ORDER — SODIUM CHLORIDE 0.9 % IV SOLN
INTRAVENOUS | Status: DC
  Administered 2016-04-10: 07:00:00 via INTRAVENOUS

## 2016-04-09 MED ORDER — FUROSEMIDE 40 MG PO TABS
40.0000 mg | ORAL_TABLET | Freq: Every day | ORAL | Status: DC
  Administered 2016-04-10 – 2016-04-11 (×2): 40 mg via ORAL
  Filled 2016-04-09 (×2): qty 1

## 2016-04-09 NOTE — Progress Notes (Addendum)
Patient ID: Rachael Stone, female   DOB: 1965/08/13, 50 y.o.   MRN: [id number]  PROGRESS NOTE    Rachael Stone  KPT:[id number] DOB: [birth year] DOA: 04/07/2016  PCP: No PCP Per Patient   Brief Narrative:  50 y.o.femalewith no significant past medical history who presented to [facility] ED with worsening shortness of breath and non productive cough ongoing for past 1 month. She has been treated meanwhile with azithromycin and albuterol inhaler. Her symptoms improved slightly however not significantly. Patient reports shortness of breath worse with exertion. She is also short of breath when lying down.  Patient was hemodynamically stable on the admission. Oxygen saturation was 93% on room air. Chest x-ray showed possible interstitial lung disease. CT angiogram of the chest showed pleural effusion and pulmonary edema concerning for CHF. Patient was given Lasix IV on the admission.   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 We also gave her Solu-Medrol yesterday. - 2-D echo on this admission with ejection fraction of 35% and grade 2 diastolic dysfunction - We will continue Lasix 40 mg IV daily - Appreciate cardiology consult and recommendation  Hypokalemia - Secondary to nebulizer treatments, lasix - Supplemented  Essential hypertension - Continue lisinopril 2.5 mg daily  History of tobacco use - Continue nicotine patch   DVT prophylaxis: Lovenox subcutaneous Code Status: full code  Family Communication: No family at the bedside Disposition Plan: We'll get cardiology consult and determine when she is ready for discharge based on their recommendations   Consultants:   Cardio  Procedures:   2 D ECHO - EF 35% and grade 2  DD  Antimicrobials:   None     Subjective: Feels better.   Objective: Vitals:   04/08/16 1212 04/08/16 1430 04/08/16 2146 04/09/16 0510  BP:  (!) 143/74 (!) 151/85 (!) 146/81  Pulse:  (!) 102 (!) 101 99  Resp:  '18 19 18  '$ Temp:  98 F (36.7 C) 98 F (36.7 C) 98.1 F (36.7 C)  TempSrc:  Oral Oral Oral  SpO2: 98% 98% 98% 100%  Weight:    68 kg (149 lb 14.6 oz)  Height:        Intake/Output Summary (Last 24 hours) at 04/09/16 1052 Last data filed at 04/08/16 1700  Gross per 24 hour  Intake              120 ml  Output             1000 ml  Net             -880 ml   Filed Weights   04/07/16 2340 04/08/16 0643 04/09/16 0510  Weight: 70.9 kg (156 lb 6.4 oz) 69.2 kg (152 lb 8 oz) 68 kg (149 lb 14.6 oz)    Examination:  General exam: Appears calm and comfortable  Respiratory system: Respiratory effort normal.Basilar crackles  Cardiovascular system: S1 & S2 heard, RRR.  Gastrointestinal system: Abdomen is nondistended, soft and nontender. No organomegaly or masses felt. Normal bowel sounds heard. Central nervous system: Alert and oriented. No focal neurological deficits. Extremities: Symmetric 5 x 5 power. Skin: No rashes, lesions or ulcers Psychiatry: Judgement and insight appear normal. Mood & affect appropriate.   Data Reviewed: I have personally reviewed following  labs and imaging studies  CBC:  Recent Labs Lab 04/06/16 1205 04/07/16 2051 04/08/16 0643  WBC 7.5 8.0 10.5  NEUTROABS  --  7.0 7.3  HGB 12.8 12.8 13.7  HCT 38.3 36.1 39.7  MCV 87.2 84.3 85.0  PLT 253 274 342   Basic Metabolic Panel:  Recent Labs Lab 04/06/16 1205 04/07/16 2147 04/08/16 0643 04/09/16 0509  NA 137 137 137 137  K 2.9* 3.8 2.9* 3.8  CL 106 106 105 104  CO2 24 21* 22 21*  GLUCOSE 91 135* 99 122*  BUN '10 9 8 15  '$ CREATININE 0.85 0.73 0.85 0.90  CALCIUM 8.6* 9.3 9.1 9.1   GFR: Estimated Creatinine Clearance: 70.2 mL/min (by C-G formula based on SCr of 0.9 mg/dL). Liver  Function Tests:  Recent Labs Lab 04/08/16 0643  AST 29  ALT 22  ALKPHOS 56  BILITOT 0.8  PROT 7.9  ALBUMIN 4.1   No results for input(s): LIPASE, AMYLASE in the last 168 hours. No results for input(s): AMMONIA in the last 168 hours. Coagulation Profile: No results for input(s): INR, PROTIME in the last 168 hours. Cardiac Enzymes:  Recent Labs Lab 04/06/16 1205 04/08/16 0102 04/08/16 0643 04/08/16 1326  TROPONINI <0.03 <0.03 <0.03 <0.03   BNP (last 3 results) No results for input(s): PROBNP in the last 8760 hours. HbA1C: No results for input(s): HGBA1C in the last 72 hours. CBG: No results for input(s): GLUCAP in the last 168 hours. Lipid Profile: No results for input(s): CHOL, HDL, LDLCALC, TRIG, CHOLHDL, LDLDIRECT in the last 72 hours. Thyroid Function Tests:  Recent Labs  04/08/16 0102  TSH 0.524   Anemia Panel: No results for input(s): VITAMINB12, FOLATE, FERRITIN, TIBC, IRON, RETICCTPCT in the last 72 hours. Urine analysis:    Component Value Date/Time   COLORURINE YELLOW 04/08/2016 0031   APPEARANCEUR CLEAR 04/08/2016 0031   LABSPEC 1.008 04/08/2016 0031   PHURINE 5.5 04/08/2016 0031   GLUCOSEU NEGATIVE 04/08/2016 0031   HGBUR MODERATE (A) 04/08/2016 0031   BILIRUBINUR NEGATIVE 04/08/2016 0031   KETONESUR NEGATIVE 04/08/2016 0031   PROTEINUR NEGATIVE 04/08/2016 0031   UROBILINOGEN 0.2 04/11/2013 0308   NITRITE NEGATIVE 04/08/2016 0031   LEUKOCYTESUR NEGATIVE 04/08/2016 0031   Sepsis Labs: '@LABRCNTIP'$ (procalcitonin:4,lacticidven:4)   )No results found for this or any previous visit (from the past 240 hour(s)).    Radiology Studies: Dg Chest 2 View Result Date: 04/06/2016 Interval worsening of diffuse prominence of the parahilar interstitial markings with top-normal heart size. Differential includes interstitial lung disease or atypical infection. Electronically Signed   By: Ilona Sorrel M.D.   On: 04/06/2016 12:07   Ct Angio Chest Pe W And/or  Wo Contrast Result Date: 04/07/2016 1. No evidence for acute pulmonary embolus. 2. Pleural effusions and pulmonary edema is identified suspicious for CHF. 3. Left upper lobe subpleural nodule measures 9 mm. Consider one of the following in 3 months for both low-risk and high-risk individuals: (a) repeat chest CT, (b) follow-up PET-CT, or (c) tissue sampling. This recommendation follows the consensus statement: Guidelines for Management of Incidental Pulmonary Nodules Detected on CT Images: From the Fleischner Society 2017; Radiology 2017; 284:228-243. 4. Enlarged mediastinal and right hilar nodes. Attention on follow-up imaging is recommended. Electronically Signed   By: Kerby Moors M.D.   On: 04/07/2016 21:16      Scheduled Meds: . enoxaparin (LOVENOX) injection  40 mg Subcutaneous Q24H  . folic acid  1 mg Oral Daily  . furosemide  40 mg Intravenous  Daily  . lisinopril  2.5 mg Oral Daily  . LORazepam  0-4 mg Oral Q6H   Followed by  . [START ON 04/10/2016] LORazepam  0-4 mg Oral Q12H  . multivitamin with minerals  1 tablet Oral Daily  . nicotine  14 mg Transdermal Daily  . potassium chloride  10 mEq Oral Daily  . thiamine  100 mg Oral Daily   Or  . thiamine  100 mg Intravenous Daily   Continuous Infusions:    LOS: 1 day    Time spent: 25 minutes  Greater than 50% of the time spent on counseling and coordinating the care.   Leisa Lenz, MD [facility] Pager [phone]  If 7PM-7AM, please contact night-coverage [email or web address] Password [facility] 04/09/2016, 10:52 AM

## 2016-04-09 NOTE — Consult Note (Signed)
Cardiology Consult    Patient ID: Rachael Stone MRN: [id number], DOB/AGE: 48-Dec-1967   Admit date: 04/07/2016 Date of Consult: 04/09/2016  Primary Physician: No PCP Per Patient Primary Cardiologist: new - seen by Joaquim Nam, MD  Requesting Provider: A. Charlies Silvers  Patient Profile    50 y/o ? without prior cardiac hx who was admitted 9/25 with progressive dyspnea and orthopnea and has been found to have LV dysfunction (EF 35%), mod AI, and mod - sev MR.  Past Medical History   Past Medical History:  Diagnosis Date  . Back pain   . Cardiomyopathy ([place])    a. 04/08/2016 Echo: EF 35%, diff HK, mild LVH, Gr2 DD, mod AI, mod to sev MR, mildly dil LA.  [name] ETOH abuse   . Hypertensive heart disease with heart failure ([place])   . Marijuana abuse   . Moderate aortic insufficiency    a. 04/08/2016 Echo: mod AI.  [name] Moderate to Severe Mitral Regurgitation    a. 04/08/2016 Echo: mod-sev MR directed centrally.  . Tobacco abuse     Past Surgical History:  Procedure Laterality Date  . EXPLORATORY LAPAROTOMY  2003   Ectopic Pregnancy - unsure which side or if tube was removed  . TUBAL LIGATION      Allergies  Allergies  Allergen Reactions  . Ibuprofen     History of Present Illness    50 y/o ? with a h/o tob, alcohol, marijuana, and remote cocaine abuse.  She has no prior cardiac hx.  She was in her usoh until about 2-3 mos ago when she began to note DOE and cough.  She was seen in the ED with dx of bronchitis and was tx with inhalers and prednisone. She says that she continued to experience DOE and tried something over the counter more recently.  Within the past month, she began to experience orthopnea and she presented back to the ED. There was concern for pna and she was Rx azithromycin, albuterol, and prednisone taper.  Unfortunately, dyspnea and orthopnea worsened and she presented back to the ED on 9/25.  CTA of the chest was neg for PE but did show pulm edema and a 37m LUL subpleural  nodule with rec for f/u CT in 3 mos.  Following admission, she was placed on IV lasix with good response and clinical improvement.  Echo was performed 9/26 and showed an EF of 30% with mod-sev MR and mod AI.  We've been asked to eval. She is currently asymptomatic and euvolemic.  She denies any prior h/o chest pain.  Inpatient Medications    . enoxaparin (LOVENOX) injection  40 mg Subcutaneous Q24H  . folic acid  1 mg Oral Daily  . furosemide  40 mg Intravenous Daily  . lisinopril  2.5 mg Oral Daily  . LORazepam  0-4 mg Oral Q6H   Followed by  . [START ON 04/10/2016] LORazepam  0-4 mg Oral Q12H  . multivitamin with minerals  1 tablet Oral Daily  . nicotine  14 mg Transdermal Daily  . potassium chloride  10 mEq Oral Daily  . thiamine  100 mg Oral Daily   Or  . thiamine  100 mg Intravenous Daily    Family History    Family History  Problem Relation Age of Onset  . CAD Mother     First MI @ 494- 886total.  . Cirrhosis Father     alcoholic - died in his 488'C  . Lupus Sister  Social History    Social History   Social History  . Marital status: Single    Spouse name: N/A  . Number of children: N/A  . Years of education: N/A   Occupational History  . Cook    Social History Main Topics  . Smoking status: Current Every Day Smoker    Packs/day: 6.00    Years: 15.00    Types: Cigarettes  . Smokeless tobacco: Never Used  . Alcohol use Yes     Comment: At least 12 ounces of homemade moon shine every other day.  . Drug use:     Types: Marijuana     Comment: Smokes marijuana daily.  Used to smoke cocaine laced  marijuana cigarettes for 15-20 yrs but quit cocaine 6 yrs ago.  [name] Sexual activity: Yes    Birth control/ protection: Other-see comments     Comment: BTL   Other Topics Concern  . Not on file   Social History Narrative   Lives in [place] with her fiance.  Does not routinely exercise.     Review of Systems    General:  Frequent hot flashes but no chills, fever,  night sweats or weight changes.  Cardiovascular:  No chest pain, +++ dyspnea on exertion, no edema, +++ orthopnea, no palpitations, paroxysmal nocturnal dyspnea. Dermatological: No rash, lesions/masses Respiratory: +++ non-productive cough, +++ dyspnea Urologic: No hematuria, dysuria Abdominal:   No nausea, vomiting, diarrhea, bright red blood per rectum, melena, or hematemesis Neurologic:  No visual changes, wkns, changes in mental status. All other systems reviewed and are otherwise negative except as noted above.  Physical Exam    Blood pressure (!) 146/81, pulse 99, temperature 98.1 F (36.7 C), temperature source Oral, resp. rate 18, height 5' 5.5" (1.664 m), weight 149 lb 14.6 oz (68 kg), last menstrual period 03/14/2016, SpO2 100 %.  General: Pleasant, NAD Psych: Normal affect. Neuro: Alert and oriented X 3. Moves all extremities spontaneously. HEENT: Normal  Neck: Supple without bruits or JVD. Lungs:  Resp regular and unlabored, CTA. Heart: RRR no s3, s4, or murmurs. Abdomen: Soft, non-tender, non-distended, BS + x 4.  Extremities: No clubbing, cyanosis or edema. DP/PT/Radials 2+ and equal bilaterally.  Labs    Troponin [facility] of Care Test)  Recent Labs  04/07/16 2058  TROPIPOC 0.01    Recent Labs  04/08/16 0102 04/08/16 0643 04/08/16 1326  TROPONINI <0.03 <0.03 <0.03   Lab Results  Component Value Date   WBC 10.5 04/08/2016   HGB 13.7 04/08/2016   HCT 39.7 04/08/2016   MCV 85.0 04/08/2016   PLT 268 04/08/2016     Recent Labs Lab 04/08/16 0643 04/09/16 0509  NA 137 137  K 2.9* 3.8  CL 105 104  CO2 22 21*  BUN 8 15  CREATININE 0.85 0.90  CALCIUM 9.1 9.1  PROT 7.9  --   BILITOT 0.8  --   ALKPHOS 56  --   ALT 22  --   AST 29  --   GLUCOSE 99 122*   Lab Results  Component Value Date   CHOL 177 11/12/2007   HDL 44 11/12/2007   LDLCALC 101 (H) 11/12/2007   TRIG 159 (H) 11/12/2007     Radiology Studies    Dg Chest 2 View  Result Date:  04/06/2016 CLINICAL DATA:  Shortness of breath EXAM: CHEST  2 VIEW COMPARISON:  01/07/2016 chest radiograph. FINDINGS: Stable cardiomediastinal silhouette with top-normal heart size. No pneumothorax. No pleural effusion. There is diffuse  prominence of the parahilar interstitial markings, worsened. No acute consolidative airspace disease. IMPRESSION: Interval worsening of diffuse prominence of the parahilar interstitial markings with top-normal heart size. Differential includes interstitial lung disease or atypical infection. Electronically Signed   By: Ilona Sorrel M.D.   On: 04/06/2016 12:07   Ct Angio Chest Pe W And/or Wo Contrast  Result Date: 04/07/2016 CLINICAL DATA:  Shortness of breath. EXAM: CT ANGIOGRAPHY CHEST WITH CONTRAST TECHNIQUE: Multidetector CT imaging of the chest was performed using the standard protocol during bolus administration of intravenous contrast. Multiplanar CT image reconstructions and MIPs were obtained to evaluate the vascular anatomy. CONTRAST:  100 cc of Isovue 370 COMPARISON:  None. FINDINGS: Cardiovascular: Satisfactory opacification of the pulmonary arteries to the segmental level. No evidence of pulmonary embolism. Normal heart size. No pericardial effusion. Normal heart size. No pericardial effusion. Mediastinum/Nodes: The trachea appears patent and is midline. Normal appearance of the esophagus. Enlarged mediastinal and hilar lymph nodes noted. Index right hilar node measures 1.5 cm, image 144 of series 11. Index sub- carinal node measures 1.5 cm, image 132 of series 11. Index right paratracheal node measures 1.5 cm, image 85 of series 11. Lungs/Pleura: Moderate bilateral pleural effusions identified. There is moderate diffuse interstitial thickening and ground-glass attenuation compatible with pulmonary edema. Subpleural nodule left upper lobe measures 7 x 10 mm (mean diameter 9 mm), image 22 of series 12. Upper Abdomen: No acute abnormality. Musculoskeletal: No  aggressive lytic or sclerotic bone lesions. Review of the MIP images confirms the above findings. IMPRESSION: 1. No evidence for acute pulmonary embolus. 2. Pleural effusions and pulmonary edema is identified suspicious for CHF. 3. Left upper lobe subpleural nodule measures 9 mm. Consider one of the following in 3 months for both low-risk and high-risk individuals: (a) repeat chest CT, (b) follow-up PET-CT, or (c) tissue sampling. This recommendation follows the consensus statement: Guidelines for Management of Incidental Pulmonary Nodules Detected on CT Images: From the Fleischner Society 2017; Radiology 2017; 284:228-243. 4. Enlarged mediastinal and right hilar nodes. Attention on follow-up imaging is recommended. Electronically Signed   By: Kerby Moors M.D.   On: 04/07/2016 21:16    ECG & Cardiac Imaging    RSR, 98, inf TWI - old.  Assessment & Plan    1.  Acute systolic CHF/cardiomyopathy:  Pt was admitted 9/25 after a 2-3 mos h/o progressive DOE and orthopnea.  Echo on 9/26 showed EF of 30% with mod to sev MR and mod AI.  With diuresis, wt is down 3 lbs and she is currently asymptomatic.  I will switch her from acei to arb with plan to switch to entresto by Friday morning.  Will also add coreg.  Switch lasix to PO.  She will need R & L heart cath to further eval etiology of cardiomyopathy.  This is scheduled for 1:30 on 9/27.  The patient understands that risks include but are not limited to stroke (1 in 1000), death (1 in 20), kidney failure [usually temporary] (1 in 500), bleeding (1 in 200), allergic reaction [possibly serious] (1 in 200), and agrees to proceed.    2.  Valvular Heart Dzs (moderate AI/mod to sev MR):  In setting of above.  Plan R & L heart cath tomorrow as above.  3.  Hypertensive Heart Disease:  Pt denies prior hx but has been hypertensive this admission.  As above, add  blocker, change acei to arb with plan to transition to entresto.  4.  Polysubstance abuse:  Complete  cessation advised.    Signed, Murray Hodgkins, NP 04/09/2016, 1:33 PM  Attending Note:   The patient was seen and examined.  Agree with assessment and plan as noted above.  Changes made to the above note as needed.  Patient seen and independently examined with Ignacia Bayley, NP.   We discussed all aspects of the encounter. I agree with the assessment and plan as stated above.  Pt has had months of progressive dyspnea.  Now is found to have chronic systolic CHF with moderate AI and mod - severe MR. She needs a cardiac cath ( R and L )   She needs to avoid drugs and ETOH.   Hopefully , we can improve her with medications and she will not need valve surgery any time soon.    I have spent a total of 40 minutes with patient reviewing hospital  notes , telemetry, EKGs, labs and examining patient as well as establishing an assessment and plan that was discussed with the patient. > 50% of time was spent in direct patient care.    Thayer Headings, Brooke Bonito., MD, [facility] 04/09/2016, 2:56 PM [address],  [place] Pager [phone]

## 2016-04-10 ENCOUNTER — Encounter (HOSPITAL_COMMUNITY)
Admission: EM | Disposition: A | Discharge status: Home / Self Care | Payer: Self-pay | Source: Home / Self Care | Attending: Internal Medicine

## 2016-04-10 DIAGNOSIS — Z720 Tobacco use: Secondary | ICD-10-CM

## 2016-04-10 DIAGNOSIS — R0602 Shortness of breath: Secondary | ICD-10-CM

## 2016-04-10 DIAGNOSIS — I10 Essential (primary) hypertension: Secondary | ICD-10-CM

## 2016-04-10 DIAGNOSIS — I5041 Acute combined systolic (congestive) and diastolic (congestive) heart failure: Secondary | ICD-10-CM

## 2016-04-10 HISTORY — PX: CARDIAC CATHETERIZATION: SHX172

## 2016-04-10 LAB — BASIC METABOLIC PANEL
ANION GAP: 10 (ref 5–15)
BUN: 20 mg/dL (ref 6–20)
CALCIUM: 9.2 mg/dL (ref 8.9–10.3)
CO2: 25 mmol/L (ref 22–32)
CREATININE: 0.9 mg/dL (ref 0.44–1.00)
Chloride: 103 mmol/L (ref 101–111)
GLUCOSE: 91 mg/dL (ref 65–99)
Potassium: 3.5 mmol/L (ref 3.5–5.1)
Sodium: 138 mmol/L (ref 135–145)

## 2016-04-10 LAB — POCT I-STAT 3, VENOUS BLOOD GAS (G3P V)
ACID-BASE DEFICIT: 2 mmol/L (ref 0.0–2.0)
Bicarbonate: 22.5 mmol/L (ref 20.0–28.0)
O2 SAT: 60 %
PCO2 VEN: 37.1 mmHg — AB (ref 44.0–60.0)
TCO2: 24 mmol/L (ref 0–100)
pH, Ven: 7.391 (ref 7.250–7.430)
pO2, Ven: 31 mmHg — CL (ref 32.0–45.0)

## 2016-04-10 LAB — POCT I-STAT 3, ART BLOOD GAS (G3+)
ACID-BASE DEFICIT: 2 mmol/L (ref 0.0–2.0)
BICARBONATE: 21 mmol/L (ref 20.0–28.0)
O2 SAT: 89 %
PH ART: 7.434 (ref 7.350–7.450)
TCO2: 22 mmol/L (ref 0–100)
pCO2 arterial: 31.3 mmHg — ABNORMAL LOW (ref 32.0–48.0)
pO2, Arterial: 54 mmHg — ABNORMAL LOW (ref 83.0–108.0)

## 2016-04-10 LAB — MAGNESIUM: Magnesium: 2.3 mg/dL (ref 1.7–2.4)

## 2016-04-10 LAB — PROTIME-INR
INR: 1.01
Prothrombin Time: 13.3 seconds (ref 11.4–15.2)

## 2016-04-10 SURGERY — RIGHT/LEFT HEART CATH AND CORONARY ANGIOGRAPHY
Anesthesia: LOCAL

## 2016-04-10 MED ORDER — FENTANYL CITRATE (PF) 100 MCG/2ML IJ SOLN
INTRAMUSCULAR | Status: AC
  Filled 2016-04-10: qty 2

## 2016-04-10 MED ORDER — SODIUM CHLORIDE 0.9% FLUSH: 3.0000 mL | Freq: Two times a day (BID) | INTRAVENOUS | Status: DC

## 2016-04-10 MED ORDER — LIDOCAINE HCL (PF) 1 % IJ SOLN
INTRAMUSCULAR | Status: DC | PRN
  Administered 2016-04-10 (×2): 2 mL via SUBCUTANEOUS

## 2016-04-10 MED ORDER — HEPARIN SODIUM (PORCINE) 1000 UNIT/ML IJ SOLN
INTRAMUSCULAR | Status: DC | PRN
  Administered 2016-04-10: 3500 [IU] via INTRAVENOUS

## 2016-04-10 MED ORDER — VERAPAMIL HCL 2.5 MG/ML IV SOLN
INTRAVENOUS | Status: AC
  Filled 2016-04-10: qty 2

## 2016-04-10 MED ORDER — SODIUM CHLORIDE 0.9% FLUSH: 3.0000 mL | INTRAVENOUS | Status: DC | PRN

## 2016-04-10 MED ORDER — HEPARIN SODIUM (PORCINE) 1000 UNIT/ML IJ SOLN
INTRAMUSCULAR | Status: AC
  Filled 2016-04-10: qty 1

## 2016-04-10 MED ORDER — MIDAZOLAM HCL 2 MG/2ML IJ SOLN
INTRAMUSCULAR | Status: AC
  Filled 2016-04-10: qty 2

## 2016-04-10 MED ORDER — ACETAMINOPHEN 325 MG PO TABS
ORAL_TABLET | ORAL | Status: AC
  Filled 2016-04-10: qty 2

## 2016-04-10 MED ORDER — MIDAZOLAM HCL 2 MG/2ML IJ SOLN
INTRAMUSCULAR | Status: DC | PRN
  Administered 2016-04-10: 2 mg via INTRAVENOUS
  Administered 2016-04-10: 1 mg via INTRAVENOUS

## 2016-04-10 MED ORDER — HEPARIN SODIUM (PORCINE) 5000 UNIT/ML IJ SOLN: 5000.0000 [IU] | Freq: Three times a day (TID) | INTRAMUSCULAR | Status: DC

## 2016-04-10 MED ORDER — SODIUM CHLORIDE 0.9 % IV SOLN: 250.0000 mL | INTRAVENOUS | Status: DC | PRN

## 2016-04-10 MED ORDER — POTASSIUM CHLORIDE CRYS ER 20 MEQ PO TBCR
20.0000 meq | EXTENDED_RELEASE_TABLET | Freq: Once | ORAL | Status: AC
  Administered 2016-04-10: 20 meq via ORAL
  Filled 2016-04-10: qty 1

## 2016-04-10 MED ORDER — ONDANSETRON HCL 4 MG/2ML IJ SOLN: 4.0000 mg | Freq: Four times a day (QID) | INTRAMUSCULAR | Status: DC | PRN

## 2016-04-10 MED ORDER — HEPARIN (PORCINE) IN NACL 2-0.9 UNIT/ML-% IJ SOLN
INTRAMUSCULAR | Status: DC | PRN
  Administered 2016-04-10: 1500 mL

## 2016-04-10 MED ORDER — ACETAMINOPHEN 325 MG PO TABS
650.0000 mg | ORAL_TABLET | ORAL | Status: DC | PRN
  Administered 2016-04-10 – 2016-04-11 (×2): 650 mg via ORAL
  Filled 2016-04-10: qty 2

## 2016-04-10 MED ORDER — LIDOCAINE HCL (PF) 1 % IJ SOLN
INTRAMUSCULAR | Status: AC
  Filled 2016-04-10: qty 30

## 2016-04-10 MED ORDER — HEPARIN (PORCINE) IN NACL 2-0.9 UNIT/ML-% IJ SOLN
INTRAMUSCULAR | Status: DC | PRN
  Administered 2016-04-10: 10 mL via INTRA_ARTERIAL

## 2016-04-10 MED ORDER — ASPIRIN 81 MG PO CHEW
81.0000 mg | CHEWABLE_TABLET | Freq: Once | ORAL | Status: AC
  Administered 2016-04-10: 81 mg via ORAL
  Filled 2016-04-10: qty 1

## 2016-04-10 MED ORDER — VERAPAMIL HCL 2.5 MG/ML IV SOLN
INTRAVENOUS | Status: DC | PRN
  Administered 2016-04-10: 10 mL via INTRA_ARTERIAL

## 2016-04-10 MED ORDER — HEPARIN (PORCINE) IN NACL 2-0.9 UNIT/ML-% IJ SOLN
INTRAMUSCULAR | Status: AC
  Filled 2016-04-10: qty 1500

## 2016-04-10 MED ORDER — IOPAMIDOL (ISOVUE-370) INJECTION 76%
INTRAVENOUS | Status: DC | PRN
  Administered 2016-04-10: 65 mL via INTRA_ARTERIAL

## 2016-04-10 MED ORDER — FENTANYL CITRATE (PF) 100 MCG/2ML IJ SOLN
INTRAMUSCULAR | Status: DC | PRN
  Administered 2016-04-10 (×2): 25 ug via INTRAVENOUS

## 2016-04-10 MED ORDER — IOPAMIDOL (ISOVUE-370) INJECTION 76%
INTRAVENOUS | Status: AC
  Filled 2016-04-10: qty 100

## 2016-04-10 SURGICAL SUPPLY — 11 items
CATH BALLN WEDGE 5F 110CM (CATHETERS) ×2 IMPLANT
CATH IMPULSE 5F ANG/FL3.5 (CATHETERS) ×2 IMPLANT
DEVICE RAD COMP TR BAND LRG (VASCULAR PRODUCTS) ×4 IMPLANT
GLIDESHEATH SLEND SS 6F .021 (SHEATH) ×2 IMPLANT
GUIDEWIRE .025 260CM (WIRE) ×2 IMPLANT
KIT HEART LEFT (KITS) ×2 IMPLANT
PACK CARDIAC CATHETERIZATION (CUSTOM PROCEDURE TRAY) ×2 IMPLANT
SHEATH FAST CATH BRACH 5F 5CM (SHEATH) ×2 IMPLANT
SYR CONTROL 10ML ANGIOGRAPHIC (SYRINGE) ×2 IMPLANT
TRANSDUCER W/STOPCOCK (MISCELLANEOUS) ×2 IMPLANT
TUBING CIL FLEX 10 FLL-RA (TUBING) ×2 IMPLANT

## 2016-04-10 NOTE — Progress Notes (Signed)
Subjective:  No chest pain or SOB  Objective:  Vital Signs in the last 24 hours: Temp:  [98 F (36.7 C)-98.7 F (37.1 C)] 98.7 F (37.1 C) (09/28 0516) Pulse Rate:  [97-102] 102 (09/28 0516) Resp:  [18-20] 18 (09/28 0516) BP: (135-151)/(81-88) 148/88 (09/28 0516) SpO2:  [97 %-100 %] 100 % (09/28 0516) Weight:  [149 lb 7.6 oz (67.8 kg)] 149 lb 7.6 oz (67.8 kg) (09/28 0516)  Intake/Output from previous day:  Intake/Output Summary (Last 24 hours) at 04/10/16 0821 Last data filed at 04/09/16 1210  Gross per 24 hour  Intake                0 ml  Output             1000 ml  Net            -1000 ml    Physical Exam: General appearance: alert, cooperative and no distress Neck: no JVD Lungs: clear to auscultation bilaterally Heart: regular rate and rhythm Extremities: no edema Pulses: 2+ Rt RA pulse Skin: Skin color, texture, turgor normal. No rashes or lesions Neurologic: Grossly normal   Rate: 90  Rhythm: normal sinus rhythm  Lab Results:  Recent Labs  04/07/16 2051 04/08/16 0643  WBC 8.0 10.5  HGB 12.8 13.7  PLT 274 268    Recent Labs  04/09/16 0509 04/10/16 0453  NA 137 138  K 3.8 3.5  CL 104 103  CO2 21* 25  GLUCOSE 122* 91  BUN 15 20  CREATININE 0.90 0.90    Recent Labs  04/08/16 0643 04/08/16 1326  TROPONINI <0.03 <0.03    Recent Labs  04/10/16 0453  INR 1.01    Scheduled Meds: . aspirin  81 mg Oral Once  . carvedilol  3.125 mg Oral BID WC  . enoxaparin (LOVENOX) injection  40 mg Subcutaneous Q24H  . folic acid  1 mg Oral Daily  . furosemide  40 mg Oral Daily  . LORazepam  0-4 mg Oral Q12H  . losartan  25 mg Oral Daily  . multivitamin with minerals  1 tablet Oral Daily  . nicotine  14 mg Transdermal Daily  . potassium chloride  10 mEq Oral Daily  . sodium chloride flush  3 mL Intravenous Q12H  . thiamine  100 mg Oral Daily   Or  . thiamine  100 mg Intravenous Daily   Continuous Infusions: . sodium chloride 10 mL/hr at  04/10/16 0652   PRN Meds:.sodium chloride, acetaminophen **OR** acetaminophen, albuterol, hydrALAZINE, LORazepam **OR** LORazepam, ondansetron **OR** ondansetron (ZOFRAN) IV, sodium chloride flush   Imaging: Imaging results have been reviewed- CXR 04/06/16- IMPRESSION: Interval worsening of diffuse prominence of the parahilar interstitial markings with top-normal heart size. Differential includes interstitial lung disease or atypical infection.   Cardiac Studies: Echo 04/08/16 Study Conclusions  - Left ventricle: The cavity size was mildly dilated. Wall   thickness was increased in a pattern of mild LVH. The estimated   ejection fraction was 35%. Diffuse hypokinesis. Features are   consistent with a pseudonormal left ventricular filling pattern,   with concomitant abnormal relaxation and increased filling   pressure (grade 2 diastolic dysfunction). - Aortic valve: Trileaflet; mildly calcified leaflets. There was no   stenosis. There was moderate regurgitation. There was no   holodiastolic flow reversal noted in the descending thoracic   aorta. - Mitral valve: There was moderate to severe regurgitation directed   centrally. - Left atrium: The atrium was  mildly dilated. - Right ventricle: The cavity size was normal. Systolic function   was normal. - Pulmonary arteries: No complete TR doppler jet so unable to   estimate PA systolic pressure. - Systemic veins: IVC measured 1.7 cm with < 50% respirophasic   variation, suggesting RA pressure 8 mmHg.  Impressions:  - Mildly dilated LV with mild LV hypertrophy. EF 35%, diffuse   hypokinesis. Moderate diastolic dysfunction. Moderate aortic   insufficiency with trileaflet aortic valve. Moderate to severe   mitral regurgitation. MR was central, no definite leaflet   abnormalities noted.  Assessment/Plan:  50 y/o ? without prior cardiac hx who was admitted 9/25 with progressive dyspnea and orthopnea and has been found to have LV  dysfunction (EF 35%), mod AI, and mod - sev MR.  Principal Problem:   Acute combined systolic and diastolic heart failure (HCC) Active Problems:   Hypertensive heart disease with heart failure ([facility])   Cardiomyopathy ([facility])   Moderate aortic insufficiency   Moderate to Severe Mitral Regurgitation   Tobacco abuse   PLAN: Rt and Lt heart cath today. She is on Lasix and has diuresed 1.6L. Extra K+ today.   Kerin Ransom PA-C 04/10/2016, 8:21 AM  [phone]   Attending Note:   The patient was seen and examined.  Agree with assessment and plan as noted above.  Changes made to the above note as needed.  Patient seen and independently examined with Kerin Ransom, PA .   We discussed all aspects of the encounter. I agree with the assessment and plan as stated above.  Pt is feeling better Going for cath today  Breathing is better    I have spent a total of 20  minutes with patient reviewing hospital  notes , telemetry, EKGs, labs and examining patient as well as establishing an assessment and plan that was discussed with the patient. > 50% of time was spent in direct patient care.   Thayer Headings, Brooke Bonito., MD, [facility] 04/10/2016, 12:34 PM [address]  [place] Pager [phone]

## 2016-04-10 NOTE — Progress Notes (Addendum)
Patient ID: Rachael Stone, female   DOB: 1966-06-05, 50 y.o.   MRN: [id number]  PROGRESS NOTE    Rachael Stone  WJX:[id number] DOB: September 26, 1965 DOA: 04/07/2016  PCP: No PCP Per Patient   Brief Narrative:  50 y.o.femalewith no significant past medical history who presented to [facility] ED with worsening shortness of breath and non productive cough ongoing for past 1 month. She has been treated meanwhile with azithromycin and albuterol inhaler. Her symptoms improved slightly however not significantly. Patient reports shortness of breath worse with exertion. She is also short of breath when lying down.  Patient was hemodynamically stable on the admission. Oxygen saturation was 93% on room air. Chest x-ray showed possible interstitial lung disease. CT angiogram of the chest showed pleural effusion and pulmonary edema concerning for CHF. Patient was given Lasix IV on the admission.   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 We also gave her Solu-Medrol 9/26 - 2-D echo on this admission with ejection fraction of 35% and grade 2 diastolic dysfunction - Seen by cardio in consultation - Continue IV lasix  - Cardio started low dose coreg 3.125 mg BID - Plan for cardiac cath today  - Appreciate cardiology recommendation  Hypokalemia - Secondary to nebulizer treatments, lasix - Supplemented - Potassium and magnesium WNL this am  Essential hypertension - Continue lisinopril 2.5 mg daily  History of tobacco use - Continue nicotine patch - Counseled on smoking cessation    DVT prophylaxis: Lovenox subcutaneous Code Status: full code  Family Communication: No family at the bedside Disposition Plan: Cardiac cath today    Consultants:    Cardio  Procedures:   2 D ECHO - EF 35% and grade 2 DD  Antimicrobials:   None     Subjective: No overnight events.   Objective: Vitals:   04/09/16 0510 04/09/16 1415 04/09/16 2102 04/10/16 0516  BP: (!) 146/81 (!) 151/81 135/87 (!) 148/88  Pulse: 99 (!) 102 97 (!) 102  Resp: '18 20  18  '$ Temp: 98.1 F (36.7 C) 98.7 F (37.1 C) 98 F (36.7 C) 98.7 F (37.1 C)  TempSrc: Oral Oral Oral Oral  SpO2: 100% 100% 97% 100%  Weight: 68 kg (149 lb 14.6 oz)   67.8 kg (149 lb 7.6 oz)  Height:        Intake/Output Summary (Last 24 hours) at 04/10/16 0914 Last data filed at 04/09/16 1210  Gross per 24 hour  Intake                0 ml  Output             1000 ml  Net            -1000 ml   Filed Weights   04/08/16 0643 04/09/16 0510 04/10/16 0516  Weight: 69.2 kg (152 lb 8 oz) 68 kg (149 lb 14.6 oz) 67.8 kg (149 lb 7.6 oz)    Examination:  General exam: No distress   Respiratory system: No wheezing, no rhonchi  Cardiovascular system: S1 & S2 heard, Rate controlled  Gastrointestinal system: (+) BS, non tender  Central nervous system: No focal neurological deficits. Extremities: Symmetric 5 x 5 power. Palpable pulses  Skin: warm and dry Psychiatry: Normal mood and  behavior    Data Reviewed: I have personally reviewed following labs and imaging studies  CBC:  Recent Labs Lab 04/06/16 1205 04/07/16 2051 04/08/16 0643  WBC 7.5 8.0 10.5  NEUTROABS  --  7.0 7.3  HGB 12.8 12.8 13.7  HCT 38.3 36.1 39.7  MCV 87.2 84.3 85.0  PLT 253 274 786   Basic Metabolic Panel:  Recent Labs Lab 04/06/16 1205 04/07/16 2147 04/08/16 0643 04/09/16 0509 04/10/16 0453  NA 137 137 137 137 138  K 2.9* 3.8 2.9* 3.8 3.5  CL 106 106 105 104 103  CO2 24 21* 22 21* 25  GLUCOSE 91 135* 99 122* 91  BUN '10 9 8 15 20  '$ CREATININE 0.85 0.73 0.85 0.90 0.90  CALCIUM 8.6* 9.3 9.1 9.1 9.2  MG  --   --   --   --  2.3   GFR: Estimated Creatinine Clearance: 70.2 mL/min (by C-G formula based  on SCr of 0.9 mg/dL). Liver Function Tests:  Recent Labs Lab 04/08/16 0643  AST 29  ALT 22  ALKPHOS 56  BILITOT 0.8  PROT 7.9  ALBUMIN 4.1   No results for input(s): LIPASE, AMYLASE in the last 168 hours. No results for input(s): AMMONIA in the last 168 hours. Coagulation Profile:  Recent Labs Lab 04/10/16 0453  INR 1.01   Cardiac Enzymes:  Recent Labs Lab 04/06/16 1205 04/08/16 0102 04/08/16 0643 04/08/16 1326  TROPONINI <0.03 <0.03 <0.03 <0.03   BNP (last 3 results) No results for input(s): PROBNP in the last 8760 hours. HbA1C: No results for input(s): HGBA1C in the last 72 hours. CBG: No results for input(s): GLUCAP in the last 168 hours. Lipid Profile: No results for input(s): CHOL, HDL, LDLCALC, TRIG, CHOLHDL, LDLDIRECT in the last 72 hours. Thyroid Function Tests:  Recent Labs  04/08/16 0102  TSH 0.524   Anemia Panel: No results for input(s): VITAMINB12, FOLATE, FERRITIN, TIBC, IRON, RETICCTPCT in the last 72 hours. Urine analysis:    Component Value Date/Time   COLORURINE YELLOW 04/08/2016 0031   APPEARANCEUR CLEAR 04/08/2016 0031   LABSPEC 1.008 04/08/2016 0031   PHURINE 5.5 04/08/2016 0031   GLUCOSEU NEGATIVE 04/08/2016 0031   HGBUR MODERATE (A) 04/08/2016 0031   BILIRUBINUR NEGATIVE 04/08/2016 0031   KETONESUR NEGATIVE 04/08/2016 0031   PROTEINUR NEGATIVE 04/08/2016 0031   UROBILINOGEN 0.2 04/11/2013 0308   NITRITE NEGATIVE 04/08/2016 0031   LEUKOCYTESUR NEGATIVE 04/08/2016 0031   Sepsis Labs: '@LABRCNTIP'$ (procalcitonin:4,lacticidven:4)   )No results found for this or any previous visit (from the past 240 hour(s)).    Radiology Studies: Dg Chest 2 View Result Date: 04/06/2016 Interval worsening of diffuse prominence of the parahilar interstitial markings with top-normal heart size. Differential includes interstitial lung disease or atypical infection. Electronically Signed   By: Ilona Sorrel M.D.   On: 04/06/2016 12:07   Ct Angio  Chest Pe W And/or Wo Contrast Result Date: 04/07/2016 1. No evidence for acute pulmonary embolus. 2. Pleural effusions and pulmonary edema is identified suspicious for CHF. 3. Left upper lobe subpleural nodule measures 9 mm. Consider one of the following in 3 months for both low-risk and high-risk individuals: (a) repeat chest CT, (b) follow-up PET-CT, or (c) tissue sampling. This recommendation follows the consensus statement: Guidelines for Management of Incidental Pulmonary Nodules Detected on CT Images: From the Fleischner Society 2017; Radiology 2017; 284:228-243. 4. Enlarged mediastinal and right hilar nodes. Attention on follow-up imaging is recommended. Electronically Signed   By: Queen Slough.D.  On: 04/07/2016 21:16      Scheduled Meds: . carvedilol  3.125 mg Oral BID WC  . enoxaparin (LOVENOX) injection  40 mg Subcutaneous Q24H  . folic acid  1 mg Oral Daily  . furosemide  40 mg Oral Daily  . LORazepam  0-4 mg Oral Q12H  . losartan  25 mg Oral Daily  . multivitamin with minerals  1 tablet Oral Daily  . nicotine  14 mg Transdermal Daily  . potassium chloride  10 mEq Oral Daily  . sodium chloride flush  3 mL Intravenous Q12H  . thiamine  100 mg Oral Daily   Or  . thiamine  100 mg Intravenous Daily   Continuous Infusions: . sodium chloride 10 mL/hr at 04/10/16 0652     LOS: 2 days    Time spent: 25 minutes  Greater than 50% of the time spent on counseling and coordinating the care.   Leisa Lenz, MD [facility] Pager [phone]  If 7PM-7AM, please contact night-coverage [email or web address] Password [facility] 04/10/2016, 9:14 AM

## 2016-04-10 NOTE — Progress Notes (Signed)
Spoke with pt concerning discharge needs. Pt states that she will need help[ with her medications.  Pt is going to [facility] for Cardiac Cath at present time. Will continue to follow.

## 2016-04-10 NOTE — Progress Notes (Signed)
Site area: Right Brachial a 5 french venous sheath was removed  Site Prior to Removal:  Level 0  Pressure Applied For 10 MINUTES    Minutes Beginning at 1555pm  Manual:   Yes.    Patient Status During Pull:  stable  Post Pull Groin Site:  Level 0  Post Pull Instructions Given:  Yes.    Post Pull Pulses Present:  Yes.    Dressing Applied:  Yes.    Comments:  VS remain stable during sheath pull

## 2016-04-10 NOTE — H&P (View-Only) (Signed)
Subjective:  No chest pain or SOB  Objective:  Vital Signs in the last 24 hours: Temp:  [98 F (36.7 C)-98.7 F (37.1 C)] 98.7 F (37.1 C) (09/28 0516) Pulse Rate:  [97-102] 102 (09/28 0516) Resp:  [18-20] 18 (09/28 0516) BP: (135-151)/(81-88) 148/88 (09/28 0516) SpO2:  [97 %-100 %] 100 % (09/28 0516) Weight:  [149 lb 7.6 oz (67.8 kg)] 149 lb 7.6 oz (67.8 kg) (09/28 0516)  Intake/Output from previous day:  Intake/Output Summary (Last 24 hours) at 04/10/16 0821 Last data filed at 04/09/16 1210  Gross per 24 hour  Intake                0 ml  Output             1000 ml  Net            -1000 ml    Physical Exam: General appearance: alert, cooperative and no distress Neck: no JVD Lungs: clear to auscultation bilaterally Heart: regular rate and rhythm Extremities: no edema Pulses: 2+ Rt RA pulse Skin: Skin color, texture, turgor normal. No rashes or lesions Neurologic: Grossly normal   Rate: 90  Rhythm: normal sinus rhythm  Lab Results:  Recent Labs  04/07/16 2051 04/08/16 0643  WBC 8.0 10.5  HGB 12.8 13.7  PLT 274 268    Recent Labs  04/09/16 0509 04/10/16 0453  NA 137 138  K 3.8 3.5  CL 104 103  CO2 21* 25  GLUCOSE 122* 91  BUN 15 20  CREATININE 0.90 0.90    Recent Labs  04/08/16 0643 04/08/16 1326  TROPONINI <0.03 <0.03    Recent Labs  04/10/16 0453  INR 1.01    Scheduled Meds: . aspirin  81 mg Oral Once  . carvedilol  3.125 mg Oral BID WC  . enoxaparin (LOVENOX) injection  40 mg Subcutaneous Q24H  . folic acid  1 mg Oral Daily  . furosemide  40 mg Oral Daily  . LORazepam  0-4 mg Oral Q12H  . losartan  25 mg Oral Daily  . multivitamin with minerals  1 tablet Oral Daily  . nicotine  14 mg Transdermal Daily  . potassium chloride  10 mEq Oral Daily  . sodium chloride flush  3 mL Intravenous Q12H  . thiamine  100 mg Oral Daily   Or  . thiamine  100 mg Intravenous Daily   Continuous Infusions: . sodium chloride 10 mL/hr at  04/10/16 0652   PRN Meds:.sodium chloride, acetaminophen **OR** acetaminophen, albuterol, hydrALAZINE, LORazepam **OR** LORazepam, ondansetron **OR** ondansetron (ZOFRAN) IV, sodium chloride flush   Imaging: Imaging results have been reviewed- CXR 04/06/16- IMPRESSION: Interval worsening of diffuse prominence of the parahilar interstitial markings with top-normal heart size. Differential includes interstitial lung disease or atypical infection.   Cardiac Studies: Echo 04/08/16 Study Conclusions  - Left ventricle: The cavity size was mildly dilated. Wall   thickness was increased in a pattern of mild LVH. The estimated   ejection fraction was 35%. Diffuse hypokinesis. Features are   consistent with a pseudonormal left ventricular filling pattern,   with concomitant abnormal relaxation and increased filling   pressure (grade 2 diastolic dysfunction). - Aortic valve: Trileaflet; mildly calcified leaflets. There was no   stenosis. There was moderate regurgitation. There was no   holodiastolic flow reversal noted in the descending thoracic   aorta. - Mitral valve: There was moderate to severe regurgitation directed   centrally. - Left atrium: The atrium was  mildly dilated. - Right ventricle: The cavity size was normal. Systolic function   was normal. - Pulmonary arteries: No complete TR doppler jet so unable to   estimate PA systolic pressure. - Systemic veins: IVC measured 1.7 cm with < 50% respirophasic   variation, suggesting RA pressure 8 mmHg.  Impressions:  - Mildly dilated LV with mild LV hypertrophy. EF 35%, diffuse   hypokinesis. Moderate diastolic dysfunction. Moderate aortic   insufficiency with trileaflet aortic valve. Moderate to severe   mitral regurgitation. MR was central, no definite leaflet   abnormalities noted.  Assessment/Plan:  50 y/o ? without prior cardiac hx who was admitted 9/25 with progressive dyspnea and orthopnea and has been found to have LV  dysfunction (EF 35%), mod AI, and mod - sev MR.  Principal Problem:   Acute combined systolic and diastolic heart failure (HCC) Active Problems:   Hypertensive heart disease with heart failure (HCC)   Cardiomyopathy (HCC)   Moderate aortic insufficiency   Moderate to Severe Mitral Regurgitation   Tobacco abuse   PLAN: Rt and Lt heart cath today. She is on Lasix and has diuresed 1.6L. Extra K+ today.   Kerin Ransom PA-C 04/10/2016, 8:21 AM  [phone]   Attending Note:   The patient was seen and examined.  Agree with assessment and plan as noted above.  Changes made to the above note as needed.  Patient seen and independently examined with Kerin Ransom, PA .   We discussed all aspects of the encounter. I agree with the assessment and plan as stated above.  Pt is feeling better Going for cath today  Breathing is better    I have spent a total of 20  minutes with patient reviewing hospital  notes , telemetry, EKGs, labs and examining patient as well as establishing an assessment and plan that was discussed with the patient. > 50% of time was spent in direct patient care.   Thayer Headings, Brooke Bonito., MD, [facility] 04/10/2016, 12:34 PM [address]  [place] Pager [phone]

## 2016-04-10 NOTE — Progress Notes (Signed)
TR BAND REMOVAL  LOCATION:    Right radial  DEFLATED PER PROTOCOL:    Yes.    TIME BAND OFF / DRESSING APPLIED:    1700   SITE UPON ARRIVAL:    Level 0  SITE AFTER BAND REMOVAL:    Level 0  CIRCULATION SENSATION AND MOVEMENT:    Within Normal Limits   yes  COMMENTS:   Pt complaint of soreness.  Cool compress applied, no swelling noted.  Dressing remain dry and intact  Tylenol '650mg'$  PO given for discomfort

## 2016-04-10 NOTE — Progress Notes (Addendum)
Received pt from [facility] alert and oriented and denies any pain or discomfort at this time. IVF started in the right forearm.  Pt on monitor waiting for procedure. Dr Irish Lack in  to see pt.

## 2016-04-10 NOTE — Interval H&P Note (Signed)
Cath Lab Visit (complete for each Cath Lab visit)  Clinical Evaluation Leading to the Procedure:   ACS: Yes.    Non-ACS:    Anginal Classification: CCS IV  Anti-ischemic medical therapy: Minimal Therapy (1 class of medications)  Non-Invasive Test Results: No non-invasive testing performed  Prior CABG: No previous CABG      History and Physical Interval Note:  04/10/2016 2:36 PM  Rachael Stone  has presented today for surgery, with the diagnosis of cp  The various methods of treatment have been discussed with the patient and family. After consideration of risks, benefits and other options for treatment, the patient has consented to  Procedure(s): Right/Left Heart Cath and Coronary Angiography (N/A) as a surgical intervention .  The patient's history has been reviewed, patient examined, no change in status, stable for surgery.  I have reviewed the patient's chart and labs.  Questions were answered to the patient's satisfaction.     Larae Grooms

## 2016-04-10 NOTE — Progress Notes (Signed)
Pt to [facility] via [facility]

## 2016-04-11 ENCOUNTER — Encounter: Payer: Self-pay | Admitting: Cardiovascular Disease

## 2016-04-11 ENCOUNTER — Encounter (HOSPITAL_COMMUNITY): Payer: Self-pay | Admitting: Interventional Cardiology

## 2016-04-11 DIAGNOSIS — IMO0001 Reserved for inherently not codable concepts without codable children: Secondary | ICD-10-CM

## 2016-04-11 DIAGNOSIS — I351 Nonrheumatic aortic (valve) insufficiency: Secondary | ICD-10-CM

## 2016-04-11 DIAGNOSIS — I340 Nonrheumatic mitral (valve) insufficiency: Secondary | ICD-10-CM

## 2016-04-11 DIAGNOSIS — Z0389 Encounter for observation for other suspected diseases and conditions ruled out: Secondary | ICD-10-CM

## 2016-04-11 MED ORDER — ACETAMINOPHEN 325 MG PO TABS: 650.0000 mg | ORAL_TABLET | Freq: Four times a day (QID) | ORAL | 0 refills | Status: DC | PRN

## 2016-04-11 MED ORDER — CARVEDILOL 6.25 MG PO TABS: 6.2500 mg | ORAL_TABLET | Freq: Two times a day (BID) | ORAL | 0 refills | Status: DC

## 2016-04-11 MED ORDER — CARVEDILOL 3.125 MG PO TABS
3.1250 mg | ORAL_TABLET | Freq: Two times a day (BID) | ORAL | 0 refills | Status: DC
Start: 2016-04-11 — End: 2016-04-11

## 2016-04-11 MED ORDER — NICOTINE 14 MG/24HR TD PT24: 14.0000 mg | MEDICATED_PATCH | Freq: Every day | TRANSDERMAL | 0 refills | Status: DC

## 2016-04-11 MED ORDER — FUROSEMIDE 40 MG PO TABS: 40.0000 mg | ORAL_TABLET | Freq: Every day | ORAL | 0 refills | Status: DC

## 2016-04-11 MED ORDER — POTASSIUM CHLORIDE CRYS ER 10 MEQ PO TBCR: 10.0000 meq | EXTENDED_RELEASE_TABLET | Freq: Every day | ORAL | 0 refills | Status: DC

## 2016-04-11 MED ORDER — LOSARTAN POTASSIUM 25 MG PO TABS: 25.0000 mg | ORAL_TABLET | Freq: Every day | ORAL | 0 refills | Status: DC

## 2016-04-11 MED FILL — POTASSIUM CL ER 10 MEQ TAB: 10 | 30 days supply | Qty: 30 | Fill #0

## 2016-04-11 MED FILL — CARVEDILOL 6.25 MG TABLET: 6.25 | 30 days supply | Qty: 60 | Fill #0

## 2016-04-11 MED FILL — FUROSEMIDE 40 MG TABLET: 40 | 30 days supply | Qty: 30 | Fill #0

## 2016-04-11 MED FILL — LOSARTAN POTASSIUM 25 MG TA: 25 | 30 days supply | Qty: 30 | Fill #0

## 2016-04-11 NOTE — Discharge Summary (Signed)
Physician Discharge Summary  Rachael Stone PYP:[id number] DOB: 1965/11/15 DOA: 04/07/2016  PCP: No PCP Per Patient  Admit date: 04/07/2016 Discharge date: 04/11/2016  Recommendations for Outpatient Follow-up:  1. Continue lasix, potassium, coreg and losartan on discharge   Discharge Diagnoses:  Principal Problem:   Acute combined systolic and diastolic heart failure (HCC) Active Problems:   Hypertensive heart disease with heart failure (HCC)   NICM (nonischemic cardiomyopathy) (HCC)   Moderate aortic insufficiency   Moderate to Severe Mitral Regurgitation   Tobacco abuse   Normal coronary arteries    Discharge Condition: stable   Diet recommendation: as tolerated   History of present illness:  50 y.o.femalewith no significant past medical history who presented to [facility] ED with worsening shortness of breath and non productive cough ongoing for past 1 month. She has been treated meanwhile with azithromycin and albuterol inhaler. Her symptoms improved slightly however not significantly. Patient reports shortness of breath worse with exertion. She is also short of breath when lying down.  Patient was hemodynamically stable on the admission. Oxygen saturation was 93% on room air. Chest x-ray showed possible interstitial lung disease. CT angiogram of the chest showed pleural effusion and pulmonary edema concerning for CHF. Patient was given Lasix IV on the admission.  Hospital Course:  Acute systolic and diastolic congestive heart failure / Moderate Ai and moderate to severe MR - CT angiogram of the chest concerning for CHF but chest x-ray suspicious for interstitial lung disease - She was started on Lasix since the admission 40 mg IV daily. We also gave her Solu-Medrol 9/26 - 2-D echo on this admission with ejection fraction of 35% and grade 2 diastolic dysfunction - Cardiac cath on 9/28 showed nonischemic cardiomyopathy  - Continue lasix 40 mg daily along with potassium  supplementation - Continue coreg and losartan   Hypokalemia - Secondary to nebulizer treatments, lasix - Supplemented - Potassium and magnesium WNL  Essential hypertension - Continue coreg, losartan, lasix   History of tobacco use - Continue nicotine patch - Counseled on smoking cessation    DVT prophylaxis: Lovenox subcutaneous Code Status: full code  Family Communication: No family at the bedside    Consultants:   Cardio  Procedures:   2 D ECHO - EF 35% and grade 2 DD  Cardiac cath 9/28 - Nonischemic cardiomyopathy   Antimicrobials:   None     Signed:  Leisa Lenz, MD  [facility] 04/11/2016, 10:38 AM  Pager #: [phone]  Time spent in minutes: less than 30 minutes    Discharge Exam: Vitals:   04/10/16 2355 04/11/16 0450  BP: 138/77 138/89  Pulse: 94 (!) 101  Resp: 18 18  Temp: 98.1 F (36.7 C) 97.7 F (36.5 C)   Vitals:   04/10/16 2027 04/10/16 2355 04/11/16 0450 04/11/16 0659  BP: 125/78 138/77 138/89   Pulse: 87 94 (!) 101   Resp: '16 18 18   '$ Temp: 98.6 F (37 C) 98.1 F (36.7 C) 97.7 F (36.5 C)   TempSrc: Oral Oral Oral   SpO2: 92% 100% 99%   Weight:    68.1 kg (150 lb 2.1 oz)  Height:        General: Pt is alert, follows commands appropriately, not in acute distress Cardiovascular: Regular rate and rhythm, S1/S2 + Respiratory: Clear to auscultation bilaterally, no wheezing, no crackles, no rhonchi Abdominal: Soft, non tender, non distended, bowel sounds +, no guarding Extremities: no edema, no cyanosis, pulses palpable bilaterally DP and PT Neuro:  Grossly nonfocal  Discharge Instructions  Discharge Instructions    Call MD for:  persistant nausea and vomiting    Complete by:  As directed    Call MD for:  redness, tenderness, or signs of infection (pain, swelling, redness, odor or green/yellow discharge around incision site)    Complete by:  As directed    Call MD for:  severe uncontrolled pain    Complete  by:  As directed    Call MD for:  temperature >100.4    Complete by:  As directed    Diet - low sodium heart healthy    Complete by:  As directed    Discharge instructions    Complete by:  As directed    Please continue lasix, potassium and losartan on discharge as prescribed   Increase activity slowly    Complete by:  As directed        Medication List    STOP taking these medications   albuterol 108 (90 Base) MCG/ACT inhaler Commonly known as:  PROVENTIL HFA;VENTOLIN HFA   azithromycin 250 MG tablet Commonly known as:  ZITHROMAX   predniSONE 10 MG tablet Commonly known as:  DELTASONE     TAKE these medications   acetaminophen 325 MG tablet Commonly known as:  TYLENOL Take 2 tablets (650 mg total) by mouth every 6 (six) hours as needed for mild pain (or Fever >/= 101).   carvedilol 6.25 MG tablet Commonly known as:  COREG Take 1 tablet (6.25 mg total) by mouth 2 (two) times daily with a meal.   furosemide 40 MG tablet Commonly known as:  LASIX Take 1 tablet (40 mg total) by mouth daily. Start taking on:  04/12/2016   losartan 25 MG tablet Commonly known as:  COZAAR Take 1 tablet (25 mg total) by mouth daily. Start taking on:  04/12/2016   nicotine 14 mg/24hr patch Commonly known as:  NICODERM CQ - dosed in mg/24 hours Place 1 patch (14 mg total) onto the skin daily. Start taking on:  04/12/2016   potassium chloride 10 MEQ tablet Commonly known as:  K-DUR,KLOR-CON Take 1 tablet (10 mEq total) by mouth daily. Start taking on:  04/12/2016   PRIMATENE ASTHMA 12.5-200 MG Tabs Generic drug:  ePHEDrine-GuaiFENesin Take 2 tablets by mouth daily as needed (sob).       Follow-up Information    Mertie Moores, MD .   Specialty:  Cardiology Why:  office will contact you for follow up Contact information: [place] [address] [phone]            The results of significant diagnostics from this hospitalization (including  imaging, microbiology, ancillary and laboratory) are listed below for reference.    Significant Diagnostic Studies: Dg Chest 2 View  Result Date: 04/06/2016 CLINICAL DATA:  Shortness of breath EXAM: CHEST  2 VIEW COMPARISON:  01/07/2016 chest radiograph. FINDINGS: Stable cardiomediastinal silhouette with top-normal heart size. No pneumothorax. No pleural effusion. There is diffuse prominence of the parahilar interstitial markings, worsened. No acute consolidative airspace disease. IMPRESSION: Interval worsening of diffuse prominence of the parahilar interstitial markings with top-normal heart size. Differential includes interstitial lung disease or atypical infection. Electronically Signed   By: Ilona Sorrel M.D.   On: 04/06/2016 12:07   Ct Angio Chest Pe W And/or Wo Contrast  Result Date: 04/07/2016 CLINICAL DATA:  Shortness of breath. EXAM: CT ANGIOGRAPHY CHEST WITH CONTRAST TECHNIQUE: Multidetector CT imaging of the chest was performed using the  standard protocol during bolus administration of intravenous contrast. Multiplanar CT image reconstructions and MIPs were obtained to evaluate the vascular anatomy. CONTRAST:  100 cc of Isovue 370 COMPARISON:  None. FINDINGS: Cardiovascular: Satisfactory opacification of the pulmonary arteries to the segmental level. No evidence of pulmonary embolism. Normal heart size. No pericardial effusion. Normal heart size. No pericardial effusion. Mediastinum/Nodes: The trachea appears patent and is midline. Normal appearance of the esophagus. Enlarged mediastinal and hilar lymph nodes noted. Index right hilar node measures 1.5 cm, image 144 of series 11. Index sub- carinal node measures 1.5 cm, image 132 of series 11. Index right paratracheal node measures 1.5 cm, image 85 of series 11. Lungs/Pleura: Moderate bilateral pleural effusions identified. There is moderate diffuse interstitial thickening and ground-glass attenuation compatible with pulmonary edema. Subpleural  nodule left upper lobe measures 7 x 10 mm (mean diameter 9 mm), image 22 of series 12. Upper Abdomen: No acute abnormality. Musculoskeletal: No aggressive lytic or sclerotic bone lesions. Review of the MIP images confirms the above findings. IMPRESSION: 1. No evidence for acute pulmonary embolus. 2. Pleural effusions and pulmonary edema is identified suspicious for CHF. 3. Left upper lobe subpleural nodule measures 9 mm. Consider one of the following in 3 months for both low-risk and high-risk individuals: (a) repeat chest CT, (b) follow-up PET-CT, or (c) tissue sampling. This recommendation follows the consensus statement: Guidelines for Management of Incidental Pulmonary Nodules Detected on CT Images: From the Fleischner Society 2017; Radiology 2017; 284:228-243. 4. Enlarged mediastinal and right hilar nodes. Attention on follow-up imaging is recommended. Electronically Signed   By: Kerby Moors M.D.   On: 04/07/2016 21:16    Microbiology: No results found for this or any previous visit (from the past 240 hour(s)).   Labs: Basic Metabolic Panel:  Recent Labs Lab 04/06/16 1205 04/07/16 2147 04/08/16 0643 04/09/16 0509 04/10/16 0453  NA 137 137 137 137 138  K 2.9* 3.8 2.9* 3.8 3.5  CL 106 106 105 104 103  CO2 24 21* 22 21* 25  GLUCOSE 91 135* 99 122* 91  BUN '10 9 8 15 20  '$ CREATININE 0.85 0.73 0.85 0.90 0.90  CALCIUM 8.6* 9.3 9.1 9.1 9.2  MG  --   --   --   --  2.3   Liver Function Tests:  Recent Labs Lab 04/08/16 0643  AST 29  ALT 22  ALKPHOS 56  BILITOT 0.8  PROT 7.9  ALBUMIN 4.1   No results for input(s): LIPASE, AMYLASE in the last 168 hours. No results for input(s): AMMONIA in the last 168 hours. CBC:  Recent Labs Lab 04/06/16 1205 04/07/16 2051 04/08/16 0643  WBC 7.5 8.0 10.5  NEUTROABS  --  7.0 7.3  HGB 12.8 12.8 13.7  HCT 38.3 36.1 39.7  MCV 87.2 84.3 85.0  PLT 253 274 268   Cardiac Enzymes:  Recent Labs Lab 04/06/16 1205 04/08/16 0102  04/08/16 0643 04/08/16 1326  TROPONINI <0.03 <0.03 <0.03 <0.03   BNP: BNP (last 3 results)  Recent Labs  04/07/16 2051  BNP 262.1*    ProBNP (last 3 results) No results for input(s): PROBNP in the last 8760 hours.  CBG: No results for input(s): GLUCAP in the last 168 hours.

## 2016-04-11 NOTE — Discharge Instructions (Signed)
Heart Failure  Heart failure means your heart has trouble pumping blood. This makes it hard for your body to work well. Heart failure is usually a long-term (chronic) condition. You must take good care of yourself and follow your doctor's treatment plan.  HOME CARE   Take your heart medicine as told by your doctor.    Do not stop taking medicine unless your doctor tells you to.    Do not skip any dose of medicine.    Refill your medicines before they run out.    Take other medicines only as told by your doctor or pharmacist.   Stay active if told by your doctor. The elderly and people with severe heart failure should talk with a doctor about physical activity.   Eat heart-healthy foods. Choose foods that are without trans fat and are low in saturated fat, cholesterol, and salt (sodium). This includes fresh or frozen fruits and vegetables, fish, lean meats, fat-free or low-fat dairy foods, whole grains, and high-fiber foods. Lentils and dried peas and beans (legumes) are also good choices.   Limit salt if told by your doctor.   Cook in a healthy way. Roast, grill, broil, bake, poach, steam, or stir-fry foods.   Limit fluids as told by your doctor.   Weigh yourself every morning. Do this after you pee (urinate) and before you eat breakfast. Write down your weight to give to your doctor.   Take your blood pressure and write it down if your doctor tells you to.   Ask your doctor how to check your pulse. Check your pulse as told.   Lose weight if told by your doctor.   Stop smoking or chewing tobacco. Do not use gum or patches that help you quit without your doctor's approval.   Schedule and go to doctor visits as told.   Nonpregnant women should have no more than 1 drink a day. Men should have no more than 2 drinks a day. Talk to your doctor about drinking alcohol.   Stop illegal drug use.   Stay current with shots (immunizations).   Manage your health conditions as told by your doctor.   Learn to  manage your stress.   Rest when you are tired.   If it is really hot outside:    Avoid intense activities.    Use air conditioning or fans, or get in a cooler place.    Avoid caffeine and alcohol.    Wear loose-fitting, lightweight, and light-colored clothing.   If it is really cold outside:    Avoid intense activities.    Layer your clothing.    Wear mittens or gloves, a hat, and a scarf when going outside.    Avoid alcohol.   Learn about heart failure and get support as needed.   Get help to maintain or improve your quality of life and your ability to care for yourself as needed.  GET HELP IF:    You gain weight quickly.   You are more short of breath than usual.   You cannot do your normal activities.   You tire easily.   You cough more than normal, especially with activity.   You have any or more puffiness (swelling) in areas such as your hands, feet, ankles, or belly (abdomen).   You cannot sleep because it is hard to breathe.   You feel like your heart is beating fast (palpitations).   You get dizzy or light-headed when you stand up.  GET HELP   are not doing well or get worse.   This information is not intended to replace advice given to you by your health care provider. Make sure you discuss any questions you have with your health care provider.   Document Released: 04/08/2008 Document Revised: 07/21/2014 Document Reviewed: 08/16/2012 Elsevier Interactive Patient Education 2016 [place] Refer to this sheet in the next few weeks. These instructions provide you with information about caring for yourself after your procedure. Your health care provider may also give  you more specific instructions. Your treatment has been planned according to current medical practices, but problems sometimes occur. Call your health care provider if you have any problems or questions after your procedure. WHAT TO EXPECT AFTER THE PROCEDURE After your procedure, it is typical to have the following:  Bruising at the radial site that usually fades within 1-2 weeks.  Blood collecting in the tissue (hematoma) that may be painful to the touch. It should usually decrease in size and tenderness within 1-2 weeks. HOME CARE INSTRUCTIONS  Take medicines only as directed by your health care provider.  You may shower 24-48 hours after the procedure or as directed by your health care provider. Remove the bandage (dressing) and gently wash the site with plain soap and water. Pat the area dry with a clean towel. Do not rub the site, because this may cause bleeding.  Do not take baths, swim, or use a hot tub until your health care provider approves.  Check your insertion site every day for redness, swelling, or drainage.  Do not apply powder or lotion to the site.  Do not flex or bend the affected arm for 24 hours or as directed by your health care provider.  Do not push or pull heavy objects with the affected arm for 24 hours or as directed by your health care provider.  Do not lift over 10 lb (4.5 kg) for 5 days after your procedure or as directed by your health care provider.  Ask your health care provider when it is okay to:  Return to work or school.  Resume usual physical activities or sports.  Resume sexual activity.  Do not drive home if you are discharged the same day as the procedure. Have someone else drive you.  You may drive 24 hours after the procedure unless otherwise instructed by your health care provider.  Do not operate machinery or power tools for 24 hours after the procedure.  If your procedure was done as an outpatient procedure, which means that you  went home the same day as your procedure, a responsible adult should be with you for the first 24 hours after you arrive home.  Keep all follow-up visits as directed by your health care provider. This is important. SEEK MEDICAL CARE IF:  You have a fever.  You have chills.  You have increased bleeding from the radial site. Hold pressure on the site. SEEK IMMEDIATE MEDICAL CARE IF:  You have unusual pain at the radial site.  You have redness, warmth, or swelling at the radial site.  You have drainage (other than a small amount of blood on the dressing) from the radial site.  The radial site is bleeding, and the bleeding does not stop after 30 minutes of holding steady pressure on the site.  Your arm or hand becomes pale, cool, tingly, or numb.   This information is not intended to replace advice given to you by your health care provider. Make sure you  discuss any questions you have with your health care provider.   Document Released: 08/02/2010 Document Revised: 07/21/2014 Document Reviewed: 01/16/2014 Elsevier Interactive Patient Education [employer].

## 2016-04-11 NOTE — Progress Notes (Signed)
Subjective:  No chest pain or SOB. Anxious to go home.   Objective:  Vital Signs in the last 24 hours: Temp:  [97.7 F (36.5 C)-98.6 F (37 C)] 97.7 F (36.5 C) (09/29 0450) Pulse Rate:  [0-110] 101 (09/29 0450) Resp:  [0-70] 18 (09/29 0450) BP: (125-156)/(68-97) 138/89 (09/29 0450) SpO2:  [0 %-100 %] 99 % (09/29 0450) Weight:  [150 lb 2.1 oz (68.1 kg)] 150 lb 2.1 oz (68.1 kg) (09/29 0659)  Intake/Output from previous day:  Intake/Output Summary (Last 24 hours) at 04/11/16 0817 Last data filed at 04/11/16 0600  Gross per 24 hour  Intake              600 ml  Output              400 ml  Net              200 ml    Physical Exam: General appearance: alert, cooperative and no distress Neck: no JVD Lungs: clear to auscultation bilaterally Heart: regular rate and rhythm Extremities: no edema Pulse: Rt RA site without hematoma Skin: Skin color, texture, turgor normal. No rashes or lesions Neurologic: Grossly normal   Rate: 90-100  Rhythm: normal sinus rhythm  Lab Results: No results for input(s): WBC, HGB, PLT in the last 72 hours.  Recent Labs  04/09/16 0509 04/10/16 0453  NA 137 138  K 3.8 3.5  CL 104 103  CO2 21* 25  GLUCOSE 122* 91  BUN 15 20  CREATININE 0.90 0.90    Recent Labs  04/08/16 1326  TROPONINI <0.03    Recent Labs  04/10/16 0453  INR 1.01    Scheduled Meds: . carvedilol  3.125 mg Oral BID WC  . enoxaparin (LOVENOX) injection  40 mg Subcutaneous Q24H  . folic acid  1 mg Oral Daily  . furosemide  40 mg Oral Daily  . LORazepam  0-4 mg Oral Q12H  . losartan  25 mg Oral Daily  . multivitamin with minerals  1 tablet Oral Daily  . nicotine  14 mg Transdermal Daily  . potassium chloride  10 mEq Oral Daily  . sodium chloride flush  3 mL Intravenous Q12H  . sodium chloride flush  3 mL Intravenous Q12H  . thiamine  100 mg Oral Daily   Or  . thiamine  100 mg Intravenous Daily   Continuous Infusions:   PRN Meds:.sodium chloride,  sodium chloride, acetaminophen **OR** acetaminophen, acetaminophen, albuterol, hydrALAZINE, ondansetron **OR** ondansetron (ZOFRAN) IV, ondansetron (ZOFRAN) IV, sodium chloride flush, sodium chloride flush   Imaging: Imaging results have been reviewed- CXR 04/06/16- IMPRESSION: Interval worsening of diffuse prominence of the parahilar interstitial markings with top-normal heart size. Differential includes interstitial lung disease or atypical infection.   Cardiac Studies: Echo 04/08/16 Study Conclusions  - Left ventricle: The cavity size was mildly dilated. Wall   thickness was increased in a pattern of mild LVH. The estimated   ejection fraction was 35%. Diffuse hypokinesis. Features are   consistent with a pseudonormal left ventricular filling pattern,   with concomitant abnormal relaxation and increased filling   pressure (grade 2 diastolic dysfunction). - Aortic valve: Trileaflet; mildly calcified leaflets. There was no   stenosis. There was moderate regurgitation. There was no   holodiastolic flow reversal noted in the descending thoracic   aorta. - Mitral valve: There was moderate to severe regurgitation directed   centrally. - Left atrium: The atrium was mildly dilated. - Right ventricle: The  cavity size was normal. Systolic function   was normal. - Pulmonary arteries: No complete TR doppler jet so unable to   estimate PA systolic pressure. - Systemic veins: IVC measured 1.7 cm with < 50% respirophasic   variation, suggesting RA pressure 8 mmHg.  Impressions:  - Mildly dilated LV with mild LV hypertrophy. EF 35%, diffuse   hypokinesis. Moderate diastolic dysfunction. Moderate aortic   insufficiency with trileaflet aortic valve. Moderate to severe   mitral regurgitation. MR was central, no definite leaflet   abnormalities noted.  Assessment/Plan:  50 y/o ? without prior cardiac hx who was admitted 9/25 with progressive dyspnea and orthopnea and has been found to  have LV dysfunction (EF 35%), mod AI, and mod - sev MR. Cath 04/10/16 showed normal coronaries- EF 35-45%, LVEDP NL.   Principal Problem:   Acute combined systolic and diastolic heart failure (HCC) Active Problems:   Hypertensive heart disease with heart failure (HCC)   NICM (nonischemic cardiomyopathy) (HCC)   Moderate aortic insufficiency   Moderate to Severe Mitral Regurgitation   Tobacco abuse   Normal coronary arteries   PLAN: She should be OK for discharge today. Consider increasing Coreg to 6.25 mg BID, changing Lasix 40 mg to HCTZ 12.5 mg. We will arrange f/u as an OP to further adjust medications. MD to see.   Rachael Ransom PA-C 04/11/2016, 8:17 AM  [phone]  Attending Note:   The patient was seen and examined.  Agree with assessment and plan as noted above.  Changes made to the above note as needed.  Patient seen and independently examined with Rachael Ransom, PA .   We discussed all aspects of the encounter. I agree with the assessment and plan as stated above.  Rachael Stone is doing better.   Continue CHF meds  Will make adjustments as OP.    I have spent a total of 40 minutes with patient reviewing hospital  notes , telemetry, EKGs, labs and examining patient as well as establishing an assessment and plan that was discussed with the patient. > 50% of time was spent in direct patient care.    Rachael Stone, Rachael Bonito., MD, [facility] 04/11/2016, 11:08 AM [address],  [place] Pager [phone]

## 2016-04-11 NOTE — Progress Notes (Signed)
Spoke with pt concerning MATCH program, explained to pt that this is once from today's date until next year same date, there is a $3.00 co-pay for each medications, and this is for a 34 day supply with no refills. Pt states she understands.

## 2016-04-25 ENCOUNTER — Encounter: Payer: Self-pay | Admitting: Cardiology

## 2016-04-25 ENCOUNTER — Ambulatory Visit (INDEPENDENT_AMBULATORY_CARE_PROVIDER_SITE_OTHER): Payer: Self-pay | Admitting: Cardiology

## 2016-04-25 VITALS — BP 120/64 | HR 89 | Ht 65.5 in | Wt 157.0 lb

## 2016-04-25 DIAGNOSIS — F17200 Nicotine dependence, unspecified, uncomplicated: Secondary | ICD-10-CM

## 2016-04-25 DIAGNOSIS — I5042 Chronic combined systolic (congestive) and diastolic (congestive) heart failure: Secondary | ICD-10-CM

## 2016-04-25 DIAGNOSIS — I351 Nonrheumatic aortic (valve) insufficiency: Secondary | ICD-10-CM

## 2016-04-25 DIAGNOSIS — I340 Nonrheumatic mitral (valve) insufficiency: Secondary | ICD-10-CM

## 2016-04-25 DIAGNOSIS — I428 Other cardiomyopathies: Secondary | ICD-10-CM

## 2016-04-25 DIAGNOSIS — I5041 Acute combined systolic (congestive) and diastolic (congestive) heart failure: Secondary | ICD-10-CM

## 2016-04-25 LAB — BASIC METABOLIC PANEL
BUN: 14 mg/dL (ref 7–25)
CHLORIDE: 105 mmol/L (ref 98–110)
CO2: 23 mmol/L (ref 20–31)
Calcium: 8.9 mg/dL (ref 8.6–10.2)
Creat: 1.06 mg/dL (ref 0.50–1.10)
GLUCOSE: 78 mg/dL (ref 65–99)
POTASSIUM: 3.7 mmol/L (ref 3.5–5.3)
SODIUM: 137 mmol/L (ref 135–146)

## 2016-04-25 NOTE — Progress Notes (Signed)
Cardiology Office Note   Date:  04/25/2016   ID:  Rachael Stone, DOB August 31, 1965, MRN [id number]  PCP:  No PCP Per Patient  Cardiologist:  Dr. Acie Fredrickson    Chief Complaint  Patient presents with  . Hospitalization Follow-up    NICM      History of Present Illness: Rachael Stone is a 50 y.o. female who presents for post hospital for progressive dyspnea and orthopnea and has been found to have LV dysfunction (EF 35%), mod AI, and mod - sev MR.  She has a  h/o tob, alcohol, marijuana, and remote cocaine abuse.  She has no prior cardiac hx.  She had CTA of chest without PE + pulmonary edema and LUL nodule of 8m.  Echo with EF 30% and mod-sev MR and mod AI. .[name]   She had R & L cardiac cath   LV end diastolic pressure is normal.  There is moderate left ventricular systolic dysfunction.  The left ventricular ejection fraction is 35% by visual estimate.  There is no aortic valve stenosis.  LV end diastolic pressure is normal  Normal coronary arteries.  She has done well no further SOB, swelling, which was in her abd or chest pain.  She has decreased cigarettes to 5 per day down from 10 per day.  Could not afford nicotene patches.  She has cut out salt in her diet.  Unfortunately she has not taken her meds for 2 days, she is worried they will hurt her heart.  We had a long discussion on what happened to her heart and the weakness of her ventricle. We discussed how important the meds were to improve her heart.    Past Medical History:  Diagnosis Date  . Back pain   . Cardiomyopathy ([place]    a. 04/08/2016 Echo: EF 35%, diff HK, mild LVH, Gr2 DD, mod AI, mod to sev MR, mildly dil LA.  .Marland KitchenETOH abuse   . Hypertensive heart disease with heart failure ([place]   . Marijuana abuse   . Moderate aortic insufficiency    a. 04/08/2016 Echo: mod AI.  .Marland KitchenModerate to Severe Mitral Regurgitation    a. 04/08/2016 Echo: mod-sev MR directed centrally.  . Tobacco abuse     Past Surgical  History:  Procedure Laterality Date  . CARDIAC CATHETERIZATION N/A 04/10/2016   Procedure: Right/Left Heart Cath and Coronary Angiography;  Surgeon: JJettie Booze MD;  [facility];  Service: Cardiovascular;  Laterality: N/A;  . EXPLORATORY LAPAROTOMY  2003   Ectopic Pregnancy - unsure which side or if tube was removed  . TUBAL LIGATION       Current Outpatient Prescriptions  Medication Sig Dispense Refill  . acetaminophen (TYLENOL) 325 MG tablet Take 2 tablets (650 mg total) by mouth every 6 (six) hours as needed for mild pain (or Fever >/= 101). 30 tablet 0  . carvedilol (COREG) 6.25 MG tablet Take 1 tablet (6.25 mg total) by mouth 2 (two) times daily with a meal. 60 tablet 0  . ePHEDrine-GuaiFENesin (PRIMATENE ASTHMA) 12.5-200 MG TABS Take 2 tablets by mouth daily as needed (sob).    . furosemide (LASIX) 40 MG tablet Take 1 tablet (40 mg total) by mouth daily. 30 tablet 0  . losartan (COZAAR) 25 MG tablet Take 1 tablet (25 mg total) by mouth daily. 30 tablet 0  . nicotine (NICODERM CQ - DOSED IN MG/24 HOURS) 14 mg/24hr patch Place 1 patch (14 mg total) onto  the skin daily. 28 patch 0  . potassium chloride (K-DUR,KLOR-CON) 10 MEQ tablet Take 1 tablet (10 mEq total) by mouth daily. 30 tablet 0   No current facility-administered medications for this visit.     Allergies:   Ibuprofen    Social History:  The patient  reports that she has been smoking Cigarettes.  She has a 90.00 pack-year smoking history. She has never used smokeless tobacco. She reports that she drinks alcohol. She reports that she uses drugs, including Marijuana.   Family History:  The patient's family history includes CAD in her mother; Cirrhosis in her father; Lupus in her sister.    ROS:  General:no colds or fevers, no weight changes Skin:no rashes or ulcers HEENT:no blurred vision, no congestion CV:see HPI PUL:see HPI GI:no diarrhea constipation or melena, no indigestion GU:no hematuria,  no dysuria MS:no joint pain, no claudication Neuro:no syncope, no lightheadedness Endo:no diabetes, no thyroid disease  Wt Readings from Last 3 Encounters:  04/25/16 157 lb (71.2 kg)  04/11/16 150 lb 2.1 oz (68.1 kg)  01/20/15 160 lb (72.6 kg)     PHYSICAL EXAM: VS:  BP 120/64   Pulse 89   Ht 5' 5.5" (1.664 m)   Wt 157 lb (71.2 kg)   LMP 03/14/2016 (Approximate)   BMI 25.73 kg/m  , BMI Body mass index is 25.73 kg/m. General:Pleasant affect, NAD Skin:Warm and dry, brisk capillary refill HEENT:normocephalic, sclera clear, mucus membranes moist Neck:supple, no JVD, no bruits  Heart:S1S2 RRR without murmur, gallup, rub or click Lungs:clear without rales, rhonchi, or wheezes JIR:CVEL, non tender, + BS, do not palpate liver spleen or masses Ext:no lower ext edema, 2+ pedal pulses, 2+ radial pulses Neuro:alert and oriented X 3, MAE, follows commands, + facial symmetry    EKG:  EKG is NOT ordered today.   Recent Labs: 04/07/2016: B Natriuretic Peptide 262.1 04/08/2016: ALT 22; Hemoglobin 13.7; Platelets 268; TSH 0.524 04/10/2016: BUN 20; Creatinine, Ser 0.90; Magnesium 2.3; Potassium 3.5; Sodium 138    Lipid Panel    Component Value Date/Time   CHOL 177 11/12/2007 2044   TRIG 159 (H) 11/12/2007 2044   HDL 44 11/12/2007 2044   CHOLHDL 4.0 Ratio 11/12/2007 2044   VLDL 32 11/12/2007 2044   LDLCALC 101 (H) 11/12/2007 2044       Other studies Reviewed: Additional studies/ records that were reviewed today include: .  Cardiac Cath 04/10/16 Conclusion     LV end diastolic pressure is normal.  There is moderate left ventricular systolic dysfunction.  The left ventricular ejection fraction is 35% by visual estimate.  There is no aortic valve stenosis.  LV end diastolic pressure is normal.   Nonischemic cardiomyopathy.  Continue medical therapy.   Indications   Acute combined systolic and diastolic heart failure (HCC) [I50.41 (ICD-10-CM)]  Dyspnea [R06.00  (ICD-10-CM)]   Hemo Data   Flowsheet Row Most Recent Value  Fick Cardiac Output 4.36 L/min  Fick Cardiac Output Index 2.46 (L/min)/BSA  RA A Wave 9 mmHg  RA V Wave 6 mmHg  RA Mean 4 mmHg  RV Systolic Pressure 28 mmHg  RV Diastolic Pressure 3 mmHg  RV EDP 6 mmHg  PA Systolic Pressure 28 mmHg  PA Diastolic Pressure 13 mmHg  PA Mean 20 mmHg  PW A Wave 10 mmHg  PW V Wave 9 mmHg  PW Mean 8 mmHg  AO Systolic Pressure 381 mmHg  AO Diastolic Pressure 77 mmHg  AO Mean 99 mmHg  LV Systolic Pressure 017  mmHg  LV Diastolic Pressure 7 mmHg  LV EDP 12 mmHg  Arterial Occlusion Pressure Extended Systolic Pressure 761 mmHg  Arterial Occlusion Pressure Extended Diastolic Pressure 76 mmHg  Arterial Occlusion Pressure Extended Mean Pressure 98 mmHg  Left Ventricular Apex Extended Systolic Pressure 607 mmHg  Left Ventricular Apex Extended Diastolic Pressure 8 mmHg  Left Ventricular Apex Extended EDP Pressure 9 mmHg  QP/QS 1  TPVR Index 8.13 HRUI  TSVR Index 40.21 HRUI  PVR SVR Ratio 0.13  TPVR/TSVR Ratio    ECHO: 04/08/16 Study Conclusions  - Left ventricle: The cavity size was mildly dilated. Wall   thickness was increased in a pattern of mild LVH. The estimated   ejection fraction was 35%. Diffuse hypokinesis. Features are   consistent with a pseudonormal left ventricular filling pattern,   with concomitant abnormal relaxation and increased filling   pressure (grade 2 diastolic dysfunction). - Aortic valve: Trileaflet; mildly calcified leaflets. There was no   stenosis. There was moderate regurgitation. There was no   holodiastolic flow reversal noted in the descending thoracic   aorta. - Mitral valve: There was moderate to severe regurgitation directed   centrally. - Left atrium: The atrium was mildly dilated. - Right ventricle: The cavity size was normal. Systolic function   was normal. - Pulmonary arteries: No complete TR doppler jet so unable to   estimate PA systolic  pressure. - Systemic veins: IVC measured 1.7 cm with < 50% respirophasic   variation, suggesting RA pressure 8 mmHg.  Impressions:  - Mildly dilated LV with mild LV hypertrophy. EF 35%, diffuse   hypokinesis. Moderate diastolic dysfunction. Moderate aortic   insufficiency with trileaflet aortic valve. Moderate to severe   mitral regurgitation. MR was central, no definite leaflet   abnormalities noted.    ASSESSMENT AND PLAN:  1.  Acute combined systolic and diastolic heart failure ([place])- improved and euvolemic today.  She does not have scales so no weights at home, she will call if any SOB or increase of ABD girth. Continue Lasix for now and check BMP.    2.  Hypertensive heart disease with heart failure (HCC)  Stable  3.   NICM (nonischemic cardiomyopathy) ([place]) stable on BB and ACE continue meds, she will take for now and hopefully on repeat Echo in 3 months we may be able to decrease depending on her heart and symptoms.   4.   Moderate aortic insufficiency  5.   Moderate to Severe Mitral Regurgitation- see above  6.   Tobacco abuse congratulated on decreasing and discussed how to decrease further.  7.   Normal coronary arteries      Current medicines are reviewed with the patient today.  The patient Has no concerns regarding medicines.  The following changes have been made:  See above Labs/ tests ordered today include:see above  Disposition:   FU:  see above  Signed, [name], NP  04/25/2016 1:44 PM    [facility] [place], [address] [place] [place], [place] Phone: [phone]; Fax: [phone]

## 2016-04-25 NOTE — Patient Instructions (Addendum)
Medication Instructions:  Your physician recommends that you continue on your current medications as directed. Please refer to the Current Medication list given to you today.  Labwork: Your physician recommends that you return for lab work in: TODAY-BMP  Testing/Procedures: None   Follow-Up: Your physician recommends that you schedule a follow-up appointment in: 6 Weeks with Dr Acie Fredrickson.  Any Other Special Instructions Will Be Listed Below (If Applicable).  CONTINUE TO DECREASE TOBACCO USE   If you need a refill on your cardiac medications before your next appointment, please call your pharmacy.

## 2016-05-02 ENCOUNTER — Encounter: Payer: Self-pay | Admitting: *Deleted

## 2016-05-08 ENCOUNTER — Telehealth: Payer: Self-pay | Admitting: *Deleted

## 2016-05-08 NOTE — Telephone Encounter (Signed)
Pt received the letter to call re: lab work from 04/25/16 o/v. Pt was made aware of her lab results and she was very appreciative for the letter to call.  The phone # we had was incorrect, but now has been corrected.

## 2016-06-20 ENCOUNTER — Ambulatory Visit: Payer: Self-pay | Admitting: Cardiovascular Disease

## 2016-08-15 ENCOUNTER — Encounter (INDEPENDENT_AMBULATORY_CARE_PROVIDER_SITE_OTHER): Payer: Self-pay

## 2016-08-15 ENCOUNTER — Encounter: Payer: Self-pay | Admitting: Cardiovascular Disease

## 2016-08-15 ENCOUNTER — Ambulatory Visit (INDEPENDENT_AMBULATORY_CARE_PROVIDER_SITE_OTHER): Payer: Self-pay | Admitting: Cardiovascular Disease

## 2016-08-15 VITALS — BP 150/80 | HR 93 | Ht 65.5 in | Wt 170.8 lb

## 2016-08-15 DIAGNOSIS — I351 Nonrheumatic aortic (valve) insufficiency: Secondary | ICD-10-CM

## 2016-08-15 DIAGNOSIS — I5042 Chronic combined systolic (congestive) and diastolic (congestive) heart failure: Secondary | ICD-10-CM

## 2016-08-15 DIAGNOSIS — I110 Hypertensive heart disease with heart failure: Secondary | ICD-10-CM

## 2016-08-15 DIAGNOSIS — I5041 Acute combined systolic (congestive) and diastolic (congestive) heart failure: Secondary | ICD-10-CM

## 2016-08-15 DIAGNOSIS — I340 Nonrheumatic mitral (valve) insufficiency: Secondary | ICD-10-CM

## 2016-08-15 MED ORDER — LOSARTAN POTASSIUM 25 MG PO TABS: 25.0000 mg | ORAL_TABLET | Freq: Every day | ORAL | 3 refills | Status: DC

## 2016-08-15 MED ORDER — FUROSEMIDE 40 MG PO TABS: 40.0000 mg | ORAL_TABLET | Freq: Every day | ORAL | 3 refills | Status: DC

## 2016-08-15 MED ORDER — CARVEDILOL 6.25 MG PO TABS: 6.2500 mg | ORAL_TABLET | Freq: Two times a day (BID) | ORAL | 3 refills | Status: DC

## 2016-08-15 MED ORDER — POTASSIUM CHLORIDE ER 10 MEQ PO TBCR: 10.0000 meq | EXTENDED_RELEASE_TABLET | Freq: Every day | ORAL | 3 refills | Status: DC

## 2016-08-15 NOTE — Patient Instructions (Addendum)
Medication Instructions:  RESTART Lasix (Furosemide) 40 mg once daily RESTART Kdur (potassium) 10 meq once daily 1 week later RESTART Losartan 25 mg once daily 1 week after that RESTART Carvedilol (Coreg) 6.25 mg twice daily   Labwork: Your physician recommends that you return for lab work in: 3 weeks for basic metabolic panel   Testing/Procedures: None Ordered   Follow-Up: Your physician recommends that you schedule a follow-up appointment in: 3 months with Dr. Acie Fredrickson   If you need a refill on your cardiac medications before your next appointment, please call your pharmacy.   Thank you for choosing [facility]! Christen Bame, RN [phone]

## 2016-08-15 NOTE — Progress Notes (Signed)
Cardiology Office Note   Date:  08/15/2016   ID:  Rachael Stone, DOB October 30, 1965, MRN [id number]  PCP:  No PCP Per Patient  Cardiologist:  Dr. Acie Fredrickson    Chief Complaint  Patient presents with  . 6 week f/u    CHF      Notes from Rachael Stone: Rachael Stone is a 51 y.o. female who presents for post hospital for progressive dyspnea and orthopnea and has been found to have LV dysfunction (EF 35%), mod AI, and mod - sev MR.  She has a  h/o tob, alcohol, marijuana, and remote cocaine abuse.  She has no prior cardiac hx.  She had CTA of chest without PE + pulmonary edema and LUL nodule of 29m.  Echo with EF 30% and mod-sev MR and mod AI. .[name]   She had R & L cardiac cath   LV end diastolic pressure is normal.  There is moderate left ventricular systolic dysfunction.  The left ventricular ejection fraction is 35% by visual estimate.  There is no aortic valve stenosis.  LV end diastolic pressure is normal  Normal coronary arteries.  She has done well no further SOB, swelling, which was in her abd or chest pain.  She has decreased cigarettes to 5 per day down from 10 per day.  Could not afford nicotene patches.  She has cut out salt in her diet.  Unfortunately she has not taken her meds for 2 days, she is worried they will hurt her heart.  We had a long discussion on what happened to her heart and the weakness of her ventricle. We discussed how important the meds were to improve her heart.     Feb.   2, 2018: Seen with her sister, Rachael Stone   She stopped her meds when they ran out in Dec.   Says she felt better off the meds.   She says that she does not have any symptoms.   Does not want to restart meds.   Saw LCecilie Kickswho refilled her meds.  Patient did not Get her medications filled. She states that she does not understand why she needs to take these medications and does not want to take medications or her life.   Past Medical History:  Diagnosis Date  . Back pain     . Cardiomyopathy ([facility]    a. 04/08/2016 Echo: EF 35%, diff HK, mild LVH, Gr2 DD, mod AI, mod to sev MR, mildly dil LA.  .Marland KitchenETOH abuse   . Hypertensive heart disease with heart failure ([place]   . Marijuana abuse   . Moderate aortic insufficiency    a. 04/08/2016 Echo: mod AI.  .Marland KitchenModerate to Severe Mitral Regurgitation    a. 04/08/2016 Echo: mod-sev MR directed centrally.  . Tobacco abuse     Past Surgical History:  Procedure Laterality Date  . CARDIAC CATHETERIZATION N/A 04/10/2016   Procedure: Right/Left Heart Cath and Coronary Angiography;  Surgeon: JJettie Booze MD;  [facility];  Service: Cardiovascular;  Laterality: N/A;  . EXPLORATORY LAPAROTOMY  2003   Ectopic Pregnancy - unsure which side or if tube was removed  . TUBAL LIGATION       No current outpatient prescriptions on file.   No current facility-administered medications for this visit.     Allergies:   Ibuprofen    Social History:  The patient  reports that she has been smoking Cigarettes.  She has a 90.00 pack-year  smoking history. She has never used smokeless tobacco. She reports that she drinks alcohol. She reports that she uses drugs, including Marijuana.   Family History:  The patient's family history includes CAD in her mother; Cirrhosis in her father; Lupus in her sister.    ROS:  General:no colds or fevers, no weight changes Skin:no rashes or ulcers HEENT:no blurred vision, no congestion CV:see HPI PUL:see HPI GI:no diarrhea constipation or melena, no indigestion GU:no hematuria, no dysuria MS:no joint pain, no claudication Neuro:no syncope, no lightheadedness Endo:no diabetes, no thyroid disease  Wt Readings from Last 3 Encounters:  08/15/16 170 lb 12.8 oz (77.5 kg)  04/25/16 157 lb (71.2 kg)  04/11/16 150 lb 2.1 oz (68.1 kg)     PHYSICAL EXAM: VS:  BP (!) 150/80   Pulse 93   Ht 5' 5.5" (1.664 m)   Wt 170 lb 12.8 oz (77.5 kg)   SpO2 96%   BMI 27.99 kg/m  , BMI Body  mass index is 27.99 kg/m. General:Pleasant affect, NAD Skin:Warm and dry, brisk capillary refill HEENT:normocephalic, sclera clear, mucus membranes moist Neck:supple, no JVD, no bruits  Heart:S1S2 RRR  6-5/9 systolic murmur radiating to the axilla.   No  gallup, rub or click Lungs:clear without rales, rhonchi, or wheezes DJT:TSVX, non tender, + BS, do not palpate liver spleen or masses Ext:no lower ext edema, 2+ pedal pulses, 2+ radial pulses Neuro:alert and oriented X 3, MAE, follows commands, + facial symmetry    EKG:  EKG is NOT ordered today.   Recent Labs: 04/07/2016: B Natriuretic Peptide 262.1 04/08/2016: ALT 22; Hemoglobin 13.7; Platelets 268; TSH 0.524 04/10/2016: Magnesium 2.3 04/25/2016: BUN 14; Creat 1.06; Potassium 3.7; Sodium 137    Lipid Panel    Component Value Date/Time   CHOL 177 11/12/2007 2044   TRIG 159 (H) 11/12/2007 2044   HDL 44 11/12/2007 2044   CHOLHDL 4.0 Ratio 11/12/2007 2044   VLDL 32 11/12/2007 2044   LDLCALC 101 (H) 11/12/2007 2044       Other studies Reviewed: Additional studies/ records that were reviewed today include: .  Cardiac Cath 04/10/16 Conclusion     LV end diastolic pressure is normal.  There is moderate left ventricular systolic dysfunction.  The left ventricular ejection fraction is 35% by visual estimate.  There is no aortic valve stenosis.  LV end diastolic pressure is normal.   Nonischemic cardiomyopathy.  Continue medical therapy.   Indications   Acute combined systolic and diastolic heart failure (HCC) [I50.41 (ICD-10-CM)]  Dyspnea [R06.00 (ICD-10-CM)]   Hemo Data   Flowsheet Row Most Recent Value  Fick Cardiac Output 4.36 L/min  Fick Cardiac Output Index 2.46 (L/min)/BSA  RA A Wave 9 mmHg  RA V Wave 6 mmHg  RA Mean 4 mmHg  RV Systolic Pressure 28 mmHg  RV Diastolic Pressure 3 mmHg  RV EDP 6 mmHg  PA Systolic Pressure 28 mmHg  PA Diastolic Pressure 13 mmHg  PA Mean 20 mmHg  PW A Wave 10 mmHg    PW V Wave 9 mmHg  PW Mean 8 mmHg  AO Systolic Pressure 793 mmHg  AO Diastolic Pressure 77 mmHg  AO Mean 99 mmHg  LV Systolic Pressure 903 mmHg  LV Diastolic Pressure 7 mmHg  LV EDP 12 mmHg  Arterial Occlusion Pressure Extended Systolic Pressure 009 mmHg  Arterial Occlusion Pressure Extended Diastolic Pressure 76 mmHg  Arterial Occlusion Pressure Extended Mean Pressure 98 mmHg  Left Ventricular Apex Extended Systolic Pressure 233 mmHg  Left  Ventricular Apex Extended Diastolic Pressure 8 mmHg  Left Ventricular Apex Extended EDP Pressure 9 mmHg  QP/QS 1  TPVR Index 8.13 HRUI  TSVR Index 40.21 HRUI  PVR SVR Ratio 0.13  TPVR/TSVR Ratio    ECHO: 04/08/16 Study Conclusions  - Left ventricle: The cavity size was mildly dilated. Wall   thickness was increased in a pattern of mild LVH. The estimated   ejection fraction was 35%. Diffuse hypokinesis. Features are   consistent with a pseudonormal left ventricular filling pattern,   with concomitant abnormal relaxation and increased filling   pressure (grade 2 diastolic dysfunction). - Aortic valve: Trileaflet; mildly calcified leaflets. There was no   stenosis. There was moderate regurgitation. There was no   holodiastolic flow reversal noted in the descending thoracic   aorta. - Mitral valve: There was moderate to severe regurgitation directed   centrally. - Left atrium: The atrium was mildly dilated. - Right ventricle: The cavity size was normal. Systolic function   was normal. - Pulmonary arteries: No complete TR doppler jet so unable to   estimate PA systolic pressure. - Systemic veins: IVC measured 1.7 cm with < 50% respirophasic   variation, suggesting RA pressure 8 mmHg.  Impressions:  - Mildly dilated LV with mild LV hypertrophy. EF 35%, diffuse   hypokinesis. Moderate diastolic dysfunction. Moderate aortic   insufficiency with trileaflet aortic valve. Moderate to severe   mitral regurgitation. MR was central, no  definite leaflet   abnormalities noted.    ASSESSMENT AND PLAN:  1.  Acute combined systolic and diastolic heart failure ([place])- has not been taking her meds.  Does not want to take her meds She still smokes.    Still eats salty foods.    Long discussion between [place] and me Multiple attempts to convince her that she needs to take the meds and eat better and stop smoking    I think that we finally convinced her and she reluctantly agreed to take the meds again   Will start with reinitiation of lasix and potassium, Then start Losartan 4-5  Days later and then coreg 4-5 days after that.     2.  Hypertensive heart disease with heart failure (HCC)  Stable  3.   NICM (nonischemic cardiomyopathy) ([place])     4.   Moderate aortic insufficiency  5.   Moderate to Severe Mitral Regurgitation- see above  6.   Tobacco abuse.:  7.   Normal coronary arteries

## 2016-09-05 ENCOUNTER — Other Ambulatory Visit: Payer: Self-pay

## 2016-09-05 DIAGNOSIS — I5042 Chronic combined systolic (congestive) and diastolic (congestive) heart failure: Secondary | ICD-10-CM

## 2016-09-06 LAB — BASIC METABOLIC PANEL
BUN / CREAT RATIO: 16 (ref 9–23)
BUN: 14 mg/dL (ref 6–24)
CALCIUM: 8.6 mg/dL — AB (ref 8.7–10.2)
CO2: 20 mmol/L (ref 18–29)
Chloride: 102 mmol/L (ref 96–106)
Creatinine, Ser: 0.85 mg/dL (ref 0.57–1.00)
GFR calc Af Amer: 94 mL/min/{1.73_m2} (ref >59)
GFR calc non Af Amer: 81 mL/min/{1.73_m2} (ref >59)
GLUCOSE: 78 mg/dL (ref 65–99)
Potassium: 4 mmol/L (ref 3.5–5.2)
Sodium: 140 mmol/L (ref 134–144)

## 2016-09-12 ENCOUNTER — Encounter: Payer: Self-pay | Admitting: Nurse Practitioner

## 2016-11-14 ENCOUNTER — Ambulatory Visit (INDEPENDENT_AMBULATORY_CARE_PROVIDER_SITE_OTHER): Payer: Self-pay | Admitting: Cardiovascular Disease

## 2016-11-14 ENCOUNTER — Encounter (INDEPENDENT_AMBULATORY_CARE_PROVIDER_SITE_OTHER): Payer: Self-pay

## 2016-11-14 ENCOUNTER — Encounter: Payer: Self-pay | Admitting: Cardiovascular Disease

## 2016-11-14 VITALS — BP 140/84 | HR 93 | Ht 65.5 in | Wt 167.8 lb

## 2016-11-14 DIAGNOSIS — Z720 Tobacco use: Secondary | ICD-10-CM

## 2016-11-14 DIAGNOSIS — I10 Essential (primary) hypertension: Secondary | ICD-10-CM

## 2016-11-14 DIAGNOSIS — I340 Nonrheumatic mitral (valve) insufficiency: Secondary | ICD-10-CM

## 2016-11-14 DIAGNOSIS — I428 Other cardiomyopathies: Secondary | ICD-10-CM

## 2016-11-14 NOTE — Progress Notes (Addendum)
Cardiology Office Note   Date:  11/14/2016   ID:  Rachael Stone, DOB 31-Jul-1965, MRN [id number]  PCP:  No PCP Per Patient  Cardiologist:  Dr. Acie Fredrickson    Chief Complaint  Patient presents with  . Follow-up    CHF      Notes from [name]: Rachael Stone is a 51 y.o. female who presents for post hospital for progressive dyspnea and orthopnea and has been found to have LV dysfunction (EF 35%), mod AI, and mod - sev MR.  She has a  h/o tob, alcohol, marijuana, and remote cocaine abuse.  She has no prior cardiac hx.  She had CTA of chest without PE + pulmonary edema and LUL nodule of 18m.  Echo with EF 30% and mod-sev MR and mod AI. .[name]   She had R & L cardiac cath   LV end diastolic pressure is normal.  There is moderate left ventricular systolic dysfunction.  The left ventricular ejection fraction is 35% by visual estimate.  There is no aortic valve stenosis.  LV end diastolic pressure is normal  Normal coronary arteries.  She has done well no further SOB, swelling, which was in her abd or chest pain.  She has decreased cigarettes to 5 per day down from 10 per day.  Could not afford nicotene patches.  She has cut out salt in her diet.  Unfortunately she has not taken her meds for 2 days, she is worried they will hurt her heart.  We had a long discussion on what happened to her heart and the weakness of her ventricle. We discussed how important the meds were to improve her heart.     Feb.   2, 2018: Seen with her sister, Rachael Stone   She stopped her meds when they ran out in Dec.   Says she felt better off the meds.   She says that she does not have any symptoms.   Does not want to restart meds.   Saw LCecilie Kickswho refilled her meds.  Patient did not Get her medications filled. She states that she does not understand why she needs to take these medications and does not want to take medications or her life.  Nov 14, 2016:  CGayanneis seen today with her sister,  Rachael Stone  No CP or dyspnea.    Donates plasma twice a week.   BP is typically normal there  Past Medical History:  Diagnosis Date  . Back pain   . Cardiomyopathy ([place]    a. 04/08/2016 Echo: EF 35%, diff HK, mild LVH, Gr2 DD, mod AI, mod to sev MR, mildly dil LA.  .Marland KitchenETOH abuse   . Hypertensive heart disease with heart failure ([place]   . Marijuana abuse   . Moderate aortic insufficiency    a. 04/08/2016 Echo: mod AI.  .Marland KitchenModerate to Severe Mitral Regurgitation    a. 04/08/2016 Echo: mod-sev MR directed centrally.  . Tobacco abuse     Past Surgical History:  Procedure Laterality Date  . CARDIAC CATHETERIZATION N/A 04/10/2016   Procedure: Right/Left Heart Cath and Coronary Angiography;  Surgeon: JJettie Booze MD;  [facility];  Service: Cardiovascular;  Laterality: N/A;  . EXPLORATORY LAPAROTOMY  2003   Ectopic Pregnancy - unsure which side or if tube was removed  . TUBAL LIGATION       Current Outpatient Prescriptions  Medication Sig Dispense Refill  . carvedilol (COREG) 6.25 MG tablet Take  1 tablet (6.25 mg total) by mouth 2 (two) times daily. 180 tablet 3  . furosemide (LASIX) 40 MG tablet Take 1 tablet (40 mg total) by mouth daily. 90 tablet 3  . losartan (COZAAR) 25 MG tablet Take 1 tablet (25 mg total) by mouth daily. 90 tablet 3  . potassium chloride (K-DUR) 10 MEQ tablet Take 1 tablet (10 mEq total) by mouth daily. 90 tablet 3   No current facility-administered medications for this visit.     Allergies:   Ibuprofen    Social History:  The patient  reports that she has been smoking Cigarettes.  She has a 90.00 pack-year smoking history. She has never used smokeless tobacco. She reports that she drinks alcohol. She reports that she uses drugs, including Marijuana.   Family History:  The patient's family history includes CAD in her mother; Cirrhosis in her father; Lupus in her sister.    ROS:  General:no colds or fevers, no weight changes Skin:no  rashes or ulcers HEENT:no blurred vision, no congestion CV:see HPI PUL:see HPI GI:no diarrhea constipation or melena, no indigestion GU:no hematuria, no dysuria MS:no joint pain, no claudication Neuro:no syncope, no lightheadedness Endo:no diabetes, no thyroid disease  Wt Readings from Last 3 Encounters:  11/14/16 167 lb 12 oz (76.1 kg)  08/15/16 170 lb 12.8 oz (77.5 kg)  04/25/16 157 lb (71.2 kg)     PHYSICAL EXAM: VS:  BP 140/84   Pulse 93   Ht 5' 5.5" (1.664 m)   Wt 167 lb 12 oz (76.1 kg)   SpO2 98%   BMI 27.49 kg/m  , BMI Body mass index is 27.49 kg/m. General:Pleasant affect, NAD Skin:Warm and dry, brisk capillary refill HEENT:normocephalic, sclera clear, mucus membranes moist Neck:supple, no JVD, no bruits  Heart:S1S2 RRR  5-1/8 systolic murmur radiating to the axilla.   No  gallup, rub or click Lungs:clear without rales, rhonchi, or wheezes ACZ:YSAY, non tender, + BS, do not palpate liver spleen or masses Ext:no lower ext edema, 2+ pedal pulses, 2+ radial pulses Neuro:alert and oriented X 3, MAE, follows commands, + facial symmetry    EKG:  EKG is  ordered today. Nov 14, 2016:   NSR at 80.  NS T wave abn.  No changes from previous tracing   Recent Labs: 04/07/2016: B Natriuretic Peptide 262.1 04/08/2016: ALT 22; Hemoglobin 13.7; Platelets 268; TSH 0.524 04/10/2016: Magnesium 2.3 09/05/2016: BUN 14; Creatinine, Ser 0.85; Potassium 4.0; Sodium 140    Lipid Panel    Component Value Date/Time   CHOL 177 11/12/2007 2044   TRIG 159 (H) 11/12/2007 2044   HDL 44 11/12/2007 2044   CHOLHDL 4.0 Ratio 11/12/2007 2044   VLDL 32 11/12/2007 2044   LDLCALC 101 (H) 11/12/2007 2044       Other studies Reviewed: Additional studies/ records that were reviewed today include: .  Cardiac Cath 04/10/16 Conclusion     LV end diastolic pressure is normal.  There is moderate left ventricular systolic dysfunction.  The left ventricular ejection fraction is 35% by  visual estimate.  There is no aortic valve stenosis.  LV end diastolic pressure is normal.   Nonischemic cardiomyopathy.  Continue medical therapy.   Indications   Acute combined systolic and diastolic heart failure (HCC) [I50.41 (ICD-10-CM)]  Dyspnea [R06.00 (ICD-10-CM)]   Hemo Data   Flowsheet Row Most Recent Value  Fick Cardiac Output 4.36 L/min  Fick Cardiac Output Index 2.46 (L/min)/BSA  RA A Wave 9 mmHg  RA V Wave 6  mmHg  RA Mean 4 mmHg  RV Systolic Pressure 28 mmHg  RV Diastolic Pressure 3 mmHg  RV EDP 6 mmHg  PA Systolic Pressure 28 mmHg  PA Diastolic Pressure 13 mmHg  PA Mean 20 mmHg  PW A Wave 10 mmHg  PW V Wave 9 mmHg  PW Mean 8 mmHg  AO Systolic Pressure 324 mmHg  AO Diastolic Pressure 77 mmHg  AO Mean 99 mmHg  LV Systolic Pressure 401 mmHg  LV Diastolic Pressure 7 mmHg  LV EDP 12 mmHg  Arterial Occlusion Pressure Extended Systolic Pressure 027 mmHg  Arterial Occlusion Pressure Extended Diastolic Pressure 76 mmHg  Arterial Occlusion Pressure Extended Mean Pressure 98 mmHg  Left Ventricular Apex Extended Systolic Pressure 253 mmHg  Left Ventricular Apex Extended Diastolic Pressure 8 mmHg  Left Ventricular Apex Extended EDP Pressure 9 mmHg  QP/QS 1  TPVR Index 8.13 HRUI  TSVR Index 40.21 HRUI  PVR SVR Ratio 0.13  TPVR/TSVR Ratio    ECHO: 04/08/16 Study Conclusions  - Left ventricle: The cavity size was mildly dilated. Wall   thickness was increased in a pattern of mild LVH. The estimated   ejection fraction was 35%. Diffuse hypokinesis. Features are   consistent with a pseudonormal left ventricular filling pattern,   with concomitant abnormal relaxation and increased filling   pressure (grade 2 diastolic dysfunction). - Aortic valve: Trileaflet; mildly calcified leaflets. There was no   stenosis. There was moderate regurgitation. There was no   holodiastolic flow reversal noted in the descending thoracic   aorta. - Mitral valve: There was  moderate to severe regurgitation directed   centrally. - Left atrium: The atrium was mildly dilated. - Right ventricle: The cavity size was normal. Systolic function   was normal. - Pulmonary arteries: No complete TR doppler jet so unable to   estimate PA systolic pressure. - Systemic veins: IVC measured 1.7 cm with < 50% respirophasic   variation, suggesting RA pressure 8 mmHg.  Impressions:  - Mildly dilated LV with mild LV hypertrophy. EF 35%, diffuse   hypokinesis. Moderate diastolic dysfunction. Moderate aortic   insufficiency with trileaflet aortic valve. Moderate to severe   mitral regurgitation. MR was central, no definite leaflet   abnormalities noted.    ASSESSMENT AND PLAN:  1.  Acute combined systolic and diastolic heart failure ([place])- has not been taking her meds.  Does not want to take her meds I think we convinced her to stay on her meds for now.  She has major issues with compliance.    Will see her in 6 months with BMP     2.  Hypertensive heart disease with heart failure (HCC)  Stable  3.   NICM (nonischemic cardiomyopathy) ([place])     4.   Moderate aortic insufficiency  5.   Moderate to Severe Mitral Regurgitation- it has been very difficult to keep her on her medications.  She seems to be doing well at present - thanks to the help of her sister, Rachael Stone.  She has no signs of symptoms of CHF.   Advised her to watch her salt ( she clearly likes salt )   6.   Tobacco abuse.:  7.   Normal coronary arteries    Mertie Moores, MD  11/14/2016 10:36 AM    [facility] [place],  [address], [place]  [address] Pager [phone] Phone: [phone]; Fax: [phone]

## 2016-11-14 NOTE — Patient Instructions (Signed)
Medication Instructions:  Your physician recommends that you continue on your current medications as directed. Please refer to the Current Medication list given to you today.   Labwork: BMET in 6 months when you see Dr Acie Fredrickson.  Testing/Procedures: None   Follow-Up: Your physician wants you to follow-up in: 6 months with Dr Acie Fredrickson. (November 2018). You will receive a reminder letter in the mail two months in advance. If you don't receive a letter, please call our office to schedule the follow-up appointment.        If you need a refill on your cardiac medications before your next appointment, please call your pharmacy.

## 2016-11-17 NOTE — Addendum Note (Signed)
Addended by: Emmaline Life on: 11/17/2016 01:31 PM   Modules accepted: Orders

## 2017-01-01 ENCOUNTER — Ambulatory Visit (INDEPENDENT_AMBULATORY_CARE_PROVIDER_SITE_OTHER): Payer: Self-pay | Admitting: Physician Assistant

## 2017-01-09 ENCOUNTER — Ambulatory Visit (INDEPENDENT_AMBULATORY_CARE_PROVIDER_SITE_OTHER): Payer: Self-pay | Admitting: Physician Assistant

## 2017-01-09 ENCOUNTER — Encounter (INDEPENDENT_AMBULATORY_CARE_PROVIDER_SITE_OTHER): Payer: Self-pay | Admitting: Physician Assistant

## 2017-01-09 VITALS — BP 177/83 | HR 67 | Temp 98.2°F | Ht 65.5 in | Wt 161.8 lb

## 2017-01-09 DIAGNOSIS — Z23 Encounter for immunization: Secondary | ICD-10-CM

## 2017-01-09 DIAGNOSIS — Z9119 Patient's noncompliance with other medical treatment and regimen: Secondary | ICD-10-CM

## 2017-01-09 DIAGNOSIS — Z91199 Patient's noncompliance with other medical treatment and regimen due to unspecified reason: Secondary | ICD-10-CM

## 2017-01-09 DIAGNOSIS — T148XXA Other injury of unspecified body region, initial encounter: Secondary | ICD-10-CM

## 2017-01-09 DIAGNOSIS — I10 Essential (primary) hypertension: Secondary | ICD-10-CM

## 2017-01-09 DIAGNOSIS — R5383 Other fatigue: Secondary | ICD-10-CM

## 2017-01-09 MED ORDER — FUROSEMIDE 40 MG PO TABS: 40.0000 mg | ORAL_TABLET | Freq: Every day | ORAL | 5 refills | Status: DC

## 2017-01-09 MED ORDER — ACETAMINOPHEN 500 MG PO TABS: 500.0000 mg | ORAL_TABLET | Freq: Three times a day (TID) | ORAL | 0 refills | Status: AC | PRN

## 2017-01-09 MED ORDER — CYCLOBENZAPRINE HCL 10 MG PO TABS
10.0000 mg | ORAL_TABLET | Freq: Every day | ORAL | 0 refills | Status: DC
Start: 2017-01-09 — End: 2017-02-04

## 2017-01-09 MED ORDER — VITAMIN D 50 MCG (2000 UT) PO TABS: 2000.0000 [IU] | ORAL_TABLET | Freq: Every day | ORAL | 1 refills | Status: DC

## 2017-01-09 MED ORDER — CARVEDILOL 6.25 MG PO TABS: 6.2500 mg | ORAL_TABLET | Freq: Two times a day (BID) | ORAL | 5 refills | Status: DC

## 2017-01-09 MED ORDER — LOSARTAN POTASSIUM 25 MG PO TABS: 25.0000 mg | ORAL_TABLET | Freq: Every day | ORAL | 5 refills | Status: DC

## 2017-01-09 MED FILL — ?CYCLOBENZAPRINE 10 MG TABL: 10 | 14 days supply | Qty: 14 | Fill #0

## 2017-01-09 MED FILL — ?FUROSEMIDE 40 MG TABLET: 40 | 30 days supply | Qty: 30 | Fill #0

## 2017-01-09 MED FILL — ?CARVEDILOL 6.25 MG TABLET: 6.25 | 30 days supply | Qty: 60 | Fill #0

## 2017-01-09 MED FILL — LOSARTAN POTASSIUM 25 MG TA: 25 | 30 days supply | Qty: 30 | Fill #0

## 2017-01-09 NOTE — Progress Notes (Signed)
Subjective:  Patient ID: Rachael Stone, female    DOB: [birth year]  Age: 51 y.o. MRN: [id number]  CC: muscle spasms  HPI Rachael Stone is a 51 y.o. female with a PMH of HTN and cardiomyopathy presents with complaint of muscle spasms in the lower back attributed to her job. Has not been taking anti-hypertensives for 3 weeks now because she does not want to take pills the rest of her life. She has also been feeling fatigued for 2-3 weeks. Attributed to heavy menstrual period. Also states that mother had thyroid disease but pt has never been checked for thyroid disease.      Outpatient Medications Prior to Visit  Medication Sig Dispense Refill  . potassium chloride (K-DUR) 10 MEQ tablet Take 1 tablet (10 mEq total) by mouth daily. 90 tablet 3  . carvedilol (COREG) 6.25 MG tablet Take 1 tablet (6.25 mg total) by mouth 2 (two) times daily. (Patient not taking: Reported on 01/09/2017) 180 tablet 3  . furosemide (LASIX) 40 MG tablet Take 1 tablet (40 mg total) by mouth daily. 90 tablet 3  . losartan (COZAAR) 25 MG tablet Take 1 tablet (25 mg total) by mouth daily. 90 tablet 3   No facility-administered medications prior to visit.      ROS Review of Systems  Constitutional: Negative for chills, fever and malaise/fatigue.  Eyes: Negative for blurred vision.  Respiratory: Negative for shortness of breath.   Cardiovascular: Negative for chest pain and palpitations.  Gastrointestinal: Negative for abdominal pain and nausea.  Genitourinary: Negative for dysuria and hematuria.  Musculoskeletal: Negative for joint pain and myalgias.  Skin: Negative for rash.  Neurological: Negative for tingling and headaches.  Psychiatric/Behavioral: Negative for depression. The patient is not nervous/anxious.     Objective:  BP (!) 177/83 (BP Location: Right Arm, Patient Position: Sitting, Cuff Size: Normal)   Pulse 67   Temp 98.2 F (36.8 C) (Oral)   Ht 5' 5.5" (1.664 m)   Wt 161 lb 12.8 oz (73.4  kg)   LMP 12/03/2016 (Exact Date)   SpO2 96%   BMI 26.52 kg/m   BP/Weight 01/09/2017 07/20/6158 01/13/7105  Systolic BP 269 485 462  Diastolic BP 83 84 80  Wt. (Lbs) 161.8 167.75 170.8  BMI 26.52 27.49 27.99      Physical Exam  Constitutional: She is oriented to person, place, and time.  Well developed, well nourished, NAD, polite  HENT:  Head: Normocephalic and atraumatic.  Eyes: No scleral icterus.  Neck: Normal range of motion. Neck supple. No thyromegaly present.  Cardiovascular: Normal rate, regular rhythm and normal heart sounds.   Pulmonary/Chest: Effort normal and breath sounds normal.  Musculoskeletal: She exhibits no edema.  Increased muscular tonicity of the paraspinals of the lumbar spine.  Neurological: She is alert and oriented to person, place, and time. No cranial nerve deficit. Coordination normal.  Skin: Skin is warm and dry. No rash noted. No erythema. No pallor.  Psychiatric: She has a normal mood and affect. Her behavior is normal. Thought content normal.  Vitals reviewed.    Assessment & Plan:   1. Muscle strain - Begin cyclobenzaprine (FLEXERIL) 10 MG tablet; Take 1 tablet (10 mg total) by mouth at bedtime.  Dispense: 14 tablet; Refill: 0 - Begin acetaminophen (TYLENOL) 500 MG tablet; Take 1 tablet (500 mg total) by mouth every 8 (eight) hours as needed.  Dispense: 14 tablet; Refill: 0  2. Need for Tdap vaccination - Tdap vaccine greater than or equal to  7yo IM  3. Hypertension, unspecified type - Refill losartan (COZAAR) 25 MG tablet; Take 1 tablet (25 mg total) by mouth daily.  Dispense: 30 tablet; Refill: 5 - Refill furosemide (LASIX) 40 MG tablet; Take 1 tablet (40 mg total) by mouth daily.  Dispense: 30 tablet; Refill: 5 - Refill carvedilol (COREG) 6.25 MG tablet; Take 1 tablet (6.25 mg total) by mouth 2 (two) times daily.  Dispense: 60 tablet; Refill: 5  4. Fatigue, unspecified type - Begin Cholecalciferol (VITAMIN D) 2000 units tablet; Take 1  tablet (2,000 Units total) by mouth daily.  Dispense: 90 tablet; Refill: 1 - CBC with Differential - Comprehensive metabolic panel - TSH  5. Noncompliance   Meds ordered this encounter  Medications  . losartan (COZAAR) 25 MG tablet    Sig: Take 1 tablet (25 mg total) by mouth daily.    Dispense:  30 tablet    Refill:  5    Order Specific Question:   Supervising Provider    Answer:   Tresa Garter [id number]  . furosemide (LASIX) 40 MG tablet    Sig: Take 1 tablet (40 mg total) by mouth daily.    Dispense:  30 tablet    Refill:  5    Order Specific Question:   Supervising Provider    Answer:   Tresa Garter [id number]  . carvedilol (COREG) 6.25 MG tablet    Sig: Take 1 tablet (6.25 mg total) by mouth 2 (two) times daily.    Dispense:  60 tablet    Refill:  5    Order Specific Question:   Supervising Provider    Answer:   Tresa Garter [id number]  . cyclobenzaprine (FLEXERIL) 10 MG tablet    Sig: Take 1 tablet (10 mg total) by mouth at bedtime.    Dispense:  14 tablet    Refill:  0    Order Specific Question:   Supervising Provider    Answer:   Tresa Garter [id number]  . acetaminophen (TYLENOL) 500 MG tablet    Sig: Take 1 tablet (500 mg total) by mouth every 8 (eight) hours as needed.    Dispense:  14 tablet    Refill:  0    Order Specific Question:   Supervising Provider    Answer:   Tresa Garter [id number]  . Cholecalciferol (VITAMIN D) 2000 units tablet    Sig: Take 1 tablet (2,000 Units total) by mouth daily.    Dispense:  90 tablet    Refill:  1    Order Specific Question:   Supervising Provider    Answer:   Tresa Garter [id number]    Follow-up: Return in about 2 years (around 01/10/2019) for BP and PAP smear.   Clent Demark PA

## 2017-01-09 NOTE — Patient Instructions (Signed)
Muscle Strain A muscle strain (pulled muscle) happens when a muscle is stretched beyond normal length. It happens when a sudden, violent force stretches your muscle too far. Usually, a few of the fibers in your muscle are torn. Muscle strain is common in athletes. Recovery usually takes 1-2 weeks. Complete healing takes 5-6 weeks. Follow these instructions at home:  Follow the PRICE method of treatment to help your injury get better. Do this the first 2-3 days after the injury: ? Protect. Protect the muscle to keep it from getting injured again. ? Rest. Limit your activity and rest the injured body part. ? Ice. Put ice in a plastic bag. Place a towel between your skin and the bag. Then, apply the ice and leave it on from 15-20 minutes each hour. After the third day, switch to moist heat packs. ? Compression. Use a splint or elastic bandage on the injured area for comfort. Do not put it on too tightly. ? Elevate. Keep the injured body part above the level of your heart.  Only take medicine as told by your doctor.  Warm up before doing exercise to prevent future muscle strains. Contact a doctor if:  You have more pain or puffiness (swelling) in the injured area.  You feel numbness, tingling, or notice a loss of strength in the injured area. This information is not intended to replace advice given to you by your health care provider. Make sure you discuss any questions you have with your health care provider. Document Released: 04/08/2008 Document Revised: 12/06/2015 Document Reviewed: 01/27/2013 Elsevier Interactive Patient Education  2017 [employer] Inc[employer]  

## 2017-01-10 LAB — CBC WITH DIFFERENTIAL/PLATELET
BASOS: 0 %
Basophils Absolute: 0 10*3/uL (ref 0.0–0.2)
EOS (ABSOLUTE): 0.1 10*3/uL (ref 0.0–0.4)
EOS: 2 %
HEMATOCRIT: 45.1 % (ref 34.0–46.6)
HEMOGLOBIN: 14.7 g/dL (ref 11.1–15.9)
Immature Grans (Abs): 0 10*3/uL (ref 0.0–0.1)
Immature Granulocytes: 0 %
LYMPHS ABS: 1.7 10*3/uL (ref 0.7–3.1)
Lymphs: 25 %
MCH: 28.4 pg (ref 26.6–33.0)
MCHC: 32.6 g/dL (ref 31.5–35.7)
MCV: 87 fL (ref 79–97)
MONOCYTES: 7 %
MONOS ABS: 0.5 10*3/uL (ref 0.1–0.9)
NEUTROS ABS: 4.6 10*3/uL (ref 1.4–7.0)
Neutrophils: 66 %
Platelets: 271 10*3/uL (ref 150–379)
RBC: 5.18 x10E6/uL (ref 3.77–5.28)
RDW: 15.3 % (ref 12.3–15.4)
WBC: 7 10*3/uL (ref 3.4–10.8)

## 2017-01-10 LAB — COMPREHENSIVE METABOLIC PANEL
A/G RATIO: 1.4 (ref 1.2–2.2)
ALBUMIN: 4 g/dL (ref 3.5–5.5)
ALK PHOS: 52 IU/L (ref 39–117)
ALT: 24 IU/L (ref 0–32)
AST: 29 IU/L (ref 0–40)
BUN / CREAT RATIO: 9 (ref 9–23)
BUN: 8 mg/dL (ref 6–24)
Bilirubin Total: 0.3 mg/dL (ref 0.0–1.2)
CO2: 23 mmol/L (ref 20–29)
CREATININE: 0.9 mg/dL (ref 0.57–1.00)
Calcium: 9.3 mg/dL (ref 8.7–10.2)
Chloride: 105 mmol/L (ref 96–106)
GFR calc Af Amer: 87 mL/min/{1.73_m2} (ref >59)
GFR, EST NON AFRICAN AMERICAN: 76 mL/min/{1.73_m2} (ref >59)
GLOBULIN, TOTAL: 2.8 g/dL (ref 1.5–4.5)
Glucose: 125 mg/dL — ABNORMAL HIGH (ref 65–99)
POTASSIUM: 4.3 mmol/L (ref 3.5–5.2)
SODIUM: 143 mmol/L (ref 134–144)
Total Protein: 6.8 g/dL (ref 6.0–8.5)

## 2017-01-10 LAB — TSH: TSH: 0.254 u[IU]/mL — AB (ref 0.450–4.500)

## 2017-01-22 ENCOUNTER — Ambulatory Visit (INDEPENDENT_AMBULATORY_CARE_PROVIDER_SITE_OTHER): Payer: Self-pay | Admitting: Physician Assistant

## 2017-01-22 ENCOUNTER — Encounter (INDEPENDENT_AMBULATORY_CARE_PROVIDER_SITE_OTHER): Payer: Self-pay | Admitting: Physician Assistant

## 2017-01-22 ENCOUNTER — Other Ambulatory Visit (HOSPITAL_COMMUNITY)
Admission: RE | Admit: 2017-01-22 | Discharge: 2017-01-22 | Disposition: A | Discharge status: Home / Self Care | Payer: Medicaid Other | Source: Ambulatory Visit | Attending: Physician Assistant | Admitting: Physician Assistant

## 2017-01-22 VITALS — BP 146/77 | HR 76 | Temp 98.2°F | Wt 161.2 lb

## 2017-01-22 DIAGNOSIS — I10 Essential (primary) hypertension: Secondary | ICD-10-CM | POA: Insufficient documentation

## 2017-01-22 DIAGNOSIS — R7989 Other specified abnormal findings of blood chemistry: Secondary | ICD-10-CM

## 2017-01-22 DIAGNOSIS — R946 Abnormal results of thyroid function studies: Secondary | ICD-10-CM

## 2017-01-22 DIAGNOSIS — Z131 Encounter for screening for diabetes mellitus: Secondary | ICD-10-CM

## 2017-01-22 DIAGNOSIS — Z124 Encounter for screening for malignant neoplasm of cervix: Secondary | ICD-10-CM

## 2017-01-22 LAB — POCT GLYCOSYLATED HEMOGLOBIN (HGB A1C): Hemoglobin A1C: 5.6

## 2017-01-22 MED ORDER — FUROSEMIDE 40 MG PO TABS: 40.0000 mg | ORAL_TABLET | Freq: Every day | ORAL | 5 refills | Status: DC

## 2017-01-22 MED ORDER — POTASSIUM CHLORIDE ER 10 MEQ PO TBCR: 10.0000 meq | EXTENDED_RELEASE_TABLET | Freq: Every day | ORAL | 3 refills | Status: DC

## 2017-01-22 MED ORDER — CARVEDILOL 6.25 MG PO TABS: 6.2500 mg | ORAL_TABLET | Freq: Two times a day (BID) | ORAL | 5 refills | Status: DC

## 2017-01-22 MED ORDER — LOSARTAN POTASSIUM 100 MG PO TABS: 100.0000 mg | ORAL_TABLET | Freq: Every day | ORAL | 3 refills | Status: DC

## 2017-01-22 NOTE — Patient Instructions (Signed)
Hyperthyroidism Hyperthyroidism is when the thyroid is too active (overactive). Your thyroid is a large gland that is located in your neck. The thyroid helps to control how your body uses food (metabolism). When your thyroid is overactive, it produces too much of a hormone called thyroxine. What are the causes? Causes of hyperthyroidism may include:  Graves disease. This is when your immune system attacks the thyroid gland. This is the most common cause.  Inflammation of the thyroid gland.  Tumor in the thyroid gland or somewhere else.  Excessive use of thyroid medicines, including: ? Prescription thyroid supplement. ? Herbal supplements that mimic thyroid hormones.  Solid or fluid-filled lumps within your thyroid gland (thyroid nodules).  Excessive ingestion of iodine.  What increases the risk?  Being female.  Having a family history of thyroid conditions. What are the signs or symptoms? Signs and symptoms of hyperthyroidism may include:  Nervousness.  Inability to tolerate heat.  Unexplained weight loss.  Diarrhea.  Change in the texture of hair or skin.  Heart skipping beats or making extra beats.  Rapid heart rate.  Loss of menstruation.  Shaky hands.  Fatigue.  Restlessness.  Increased appetite.  Sleep problems.  Enlarged thyroid gland or nodules.  How is this diagnosed? Diagnosis of hyperthyroidism may include:  Medical history and physical exam.  Blood tests.  Ultrasound tests.  How is this treated? Treatment may include:  Medicines to control your thyroid.  Surgery to remove your thyroid.  Radiation therapy.  Follow these instructions at home:  Take medicines only as directed by your health care provider.  Do not use any tobacco products, including cigarettes, chewing tobacco, or electronic cigarettes. If you need help quitting, ask your health care provider.  Do not exercise or do physical activity until your health care provider  approves.  Keep all follow-up appointments as directed by your health care provider. This is important. Contact a health care provider if:  Your symptoms do not get better with treatment.  You have fever.  You are taking thyroid replacement medicine and you: ? Have depression. ? Feel mentally and physically slow. ? Have weight gain. Get help right away if:  You have decreased alertness or a change in your awareness.  You have abdominal pain.  You feel dizzy.  You have a rapid heartbeat.  You have an irregular heartbeat. This information is not intended to replace advice given to you by your health care provider. Make sure you discuss any questions you have with your health care provider. Document Released: 06/30/2005 Document Revised: 11/29/2015 Document Reviewed: 11/15/2013 Elsevier Interactive Patient Education  2017 [employer] Inc.  

## 2017-01-22 NOTE — Progress Notes (Signed)
Subjective:  Patient ID: Rachael Stone, female    DOB: [birth year]  Age: 51 y.o. MRN: [id number]  CC: f/u BP and needs PAP  HPI Rachael Stone is a 51 y.o. female with a PMH of HTN and cardiomyopathy presents to f/u on HTN and to have PAP done.  BP two weeks ago was 177/83. Today's BP 146/77. Taking Coreg 6.25 mg, Furosemide 40 mg, and Losartan 25 mg daily as directed. Supplementing K with K-Dur 10 meq. Does not endorse CP, palpitations, SOB, HA, abdominal pain, f/c/n/v, rash, or GI/GU sxs.    Previous labs revealed TSH at 0.254. Reports a family hx of thyroid disease. Glucose also mildly elevated at 125. A1c 5.6% in clinic today. Endorses fatigue but no other symptoms.    Needs screening PAP done. No vaginal or pelvic complaints.  Outpatient Medications Prior to Visit  Medication Sig Dispense Refill  . Cholecalciferol (VITAMIN D) 2000 units tablet Take 1 tablet (2,000 Units total) by mouth daily. 90 tablet 1  . cyclobenzaprine (FLEXERIL) 10 MG tablet Take 1 tablet (10 mg total) by mouth at bedtime. 14 tablet 0  . carvedilol (COREG) 6.25 MG tablet Take 1 tablet (6.25 mg total) by mouth 2 (two) times daily. 60 tablet 5  . furosemide (LASIX) 40 MG tablet Take 1 tablet (40 mg total) by mouth daily. 30 tablet 5  . losartan (COZAAR) 25 MG tablet Take 1 tablet (25 mg total) by mouth daily. 30 tablet 5  . potassium chloride (K-DUR) 10 MEQ tablet Take 1 tablet (10 mEq total) by mouth daily. 90 tablet 3   No facility-administered medications prior to visit.      ROS Review of Systems  Constitutional: Positive for malaise/fatigue. Negative for chills and fever.  Eyes: Negative for blurred vision.  Respiratory: Negative for shortness of breath.   Cardiovascular: Negative for chest pain and palpitations.  Gastrointestinal: Negative for abdominal pain and nausea.  Genitourinary: Negative for dysuria and hematuria.  Musculoskeletal: Negative for joint pain and myalgias.  Skin: Negative for  rash.  Neurological: Negative for tingling and headaches.  Psychiatric/Behavioral: Negative for depression. The patient is not nervous/anxious.     Objective:  BP (!) 146/77 (BP Location: Left Arm, Patient Position: Sitting, Cuff Size: Normal)   Pulse 76   Temp 98.2 F (36.8 C) (Oral)   Wt 161 lb 3.2 oz (73.1 kg)   LMP 11/25/2016 (Approximate)   SpO2 98%   BMI 26.42 kg/m   BP/Weight 01/22/2017 4/69/6295 08/21/4130  Systolic BP 440 102 725  Diastolic BP 77 83 84  Wt. (Lbs) 161.2 161.8 167.75  BMI 26.42 26.52 27.49      Physical Exam  Constitutional: She is oriented to person, place, and time.  Well developed, obese, NAD, polite  HENT:  Head: Normocephalic and atraumatic.  Eyes: No scleral icterus.  Neck: Normal range of motion. Neck supple. No thyromegaly present.  Cardiovascular: Normal rate, regular rhythm and normal heart sounds.   Pulmonary/Chest: Effort normal and breath sounds normal.  Abdominal: Soft. Bowel sounds are normal. There is no tenderness.  Genitourinary:  Genitourinary Comments: Vagina with white discharge, not clumpy/curdy. Cervix with mild erythema around os, no cervical motion tenderness. No adnexal mass or tenderness bilaterally. No uterine mass or tenderness.   Musculoskeletal: She exhibits no edema.  Neurological: She is alert and oriented to person, place, and time.  Skin: Skin is warm and dry. No rash noted. No erythema. No pallor.  Psychiatric: She has a normal mood and  affect. Her behavior is normal. Thought content normal.  Vitals reviewed.    Assessment & Plan:   1. Abnormal TSH - Thyroid Panel With TSH  2. Screening for cervical cancer - Cytology - PAP [place]  3. Essential hypertension - Increase losartan (COZAAR) 100 MG tablet; Take 1 tablet (100 mg total) by mouth daily.  Dispense: 90 tablet; Refill: 3 - Refill potassium chloride (K-DUR) 10 MEQ tablet; Take 1 tablet (10 mEq total) by mouth daily.  Dispense: 90 tablet; Refill:  3 - Refill furosemide (LASIX) 40 MG tablet; Take 1 tablet (40 mg total) by mouth daily.  Dispense: 30 tablet; Refill: 5 - Refill carvedilol (COREG) 6.25 MG tablet; Take 1 tablet (6.25 mg total) by mouth 2 (two) times daily.  Dispense: 60 tablet; Refill: 5  4. Screening for diabetes mellitus - HgB A1c 5.6% in clinic today.    Meds ordered this encounter  Medications  . losartan (COZAAR) 100 MG tablet    Sig: Take 1 tablet (100 mg total) by mouth daily.    Dispense:  90 tablet    Refill:  3    Order Specific Question:   Supervising Provider    Answer:   Tresa Garter [id number]  . potassium chloride (K-DUR) 10 MEQ tablet    Sig: Take 1 tablet (10 mEq total) by mouth daily.    Dispense:  90 tablet    Refill:  3    Order Specific Question:   Supervising Provider    Answer:   Tresa Garter [id number]  . furosemide (LASIX) 40 MG tablet    Sig: Take 1 tablet (40 mg total) by mouth daily.    Dispense:  30 tablet    Refill:  5    Order Specific Question:   Supervising Provider    Answer:   Tresa Garter [id number]  . carvedilol (COREG) 6.25 MG tablet    Sig: Take 1 tablet (6.25 mg total) by mouth 2 (two) times daily.    Dispense:  60 tablet    Refill:  5    Order Specific Question:   Supervising Provider    Answer:   Tresa Garter [[id number]]    Follow-up: Return in about 3 months (around 04/24/2017) for thyroid f/u.   Clent Demark PA

## 2017-01-23 ENCOUNTER — Telehealth (INDEPENDENT_AMBULATORY_CARE_PROVIDER_SITE_OTHER): Payer: Self-pay | Admitting: Physician Assistant

## 2017-01-23 LAB — CYTOLOGY - PAP
BACTERIAL VAGINITIS: POSITIVE — AB
CANDIDA VAGINITIS: NEGATIVE
CHLAMYDIA, DNA PROBE: NEGATIVE
Diagnosis: NEGATIVE
Neisseria Gonorrhea: NEGATIVE
TRICH (WINDOWPATH): POSITIVE — AB

## 2017-01-23 LAB — THYROID PANEL WITH TSH
FREE THYROXINE INDEX: 2 (ref 1.2–4.9)
T3 Uptake Ratio: 30 % (ref 24–39)
T4, Total: 6.8 ug/dL (ref 4.5–12.0)
TSH: 0.717 u[IU]/mL (ref 0.450–4.500)

## 2017-01-23 NOTE — Telephone Encounter (Signed)
Flexeril will not help after only taking a couple of times. She may return for reevaluation.

## 2017-01-23 NOTE — Telephone Encounter (Signed)
FWD to PCP. Kiana Hollar S Ariannie Penaloza, CMA  

## 2017-01-23 NOTE — Telephone Encounter (Signed)
Patient scheduled appointment for 7/25. Nat Christen, CMA

## 2017-01-23 NOTE — Telephone Encounter (Signed)
Patient stated still having muscle spasm did not remember to mention at office visit, yesterday.  Patient stated Flexeril is not help.  Would like to know what else she do to relieve pain.  Please follow up with patient.

## 2017-01-26 ENCOUNTER — Other Ambulatory Visit (INDEPENDENT_AMBULATORY_CARE_PROVIDER_SITE_OTHER): Payer: Self-pay | Admitting: Physician Assistant

## 2017-01-26 ENCOUNTER — Telehealth (INDEPENDENT_AMBULATORY_CARE_PROVIDER_SITE_OTHER): Payer: Self-pay

## 2017-01-26 DIAGNOSIS — N760 Acute vaginitis: Secondary | ICD-10-CM

## 2017-01-26 DIAGNOSIS — B9689 Other specified bacterial agents as the cause of diseases classified elsewhere: Secondary | ICD-10-CM

## 2017-01-26 DIAGNOSIS — A599 Trichomoniasis, unspecified: Secondary | ICD-10-CM

## 2017-01-26 MED ORDER — METRONIDAZOLE 500 MG PO TABS: 500.0000 mg | ORAL_TABLET | Freq: Two times a day (BID) | ORAL | 0 refills | Status: AC

## 2017-01-26 NOTE — Telephone Encounter (Signed)
-----   Message from Clent Demark, PA-C sent at 01/26/2017 11:53 AM EDT ----- Patient has BV and Trichomonas. I will send out treatment but her sexual partner should see provider to get treated for trichomonas also. Negative for malignancy.

## 2017-01-26 NOTE — Telephone Encounter (Signed)
Left message detailing lab results and instructed patient to call office with any questions and concerns. Nat Christen, CMA

## 2017-02-04 ENCOUNTER — Encounter (INDEPENDENT_AMBULATORY_CARE_PROVIDER_SITE_OTHER): Payer: Self-pay | Admitting: Physician Assistant

## 2017-02-04 ENCOUNTER — Ambulatory Visit (INDEPENDENT_AMBULATORY_CARE_PROVIDER_SITE_OTHER): Payer: Self-pay | Admitting: Physician Assistant

## 2017-02-04 VITALS — BP 130/77 | HR 73 | Temp 97.8°F | Wt 165.2 lb

## 2017-02-04 DIAGNOSIS — S39012A Strain of muscle, fascia and tendon of lower back, initial encounter: Secondary | ICD-10-CM

## 2017-02-04 DIAGNOSIS — I10 Essential (primary) hypertension: Secondary | ICD-10-CM

## 2017-02-04 DIAGNOSIS — S39012D Strain of muscle, fascia and tendon of lower back, subsequent encounter: Secondary | ICD-10-CM

## 2017-02-04 MED ORDER — METHOCARBAMOL 500 MG PO TABS: 500.0000 mg | ORAL_TABLET | Freq: Three times a day (TID) | ORAL | 0 refills | Status: DC

## 2017-02-04 MED ORDER — AMLODIPINE BESYLATE 10 MG PO TABS: 10.0000 mg | ORAL_TABLET | Freq: Every day | ORAL | 1 refills | Status: DC

## 2017-02-04 MED FILL — AMLODIPINE BESYLATE 10 MG T: 10 | 30 days supply | Qty: 30 | Fill #0

## 2017-02-04 NOTE — Progress Notes (Signed)
Subjective:  Patient ID: Rachael Stone, female    DOB: 1965/11/09  Age: 51 y.o. MRN: [id number]  CC: lower back pain pain  HPI Rachael Stone is a 51 y.o. female with a PMH of HTN, cardiomyopathy, aortic insufficiency, mitral regurgitation, ETOH abuse, tobacco abuse, and marijuana abuse presents with nonradiating lower back pain. 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 Has spasms during the day but much worse when she gets home to rest.  Says previous prescription of flexeril did not help. Has not performed any stretching or been to physical therapy.     Also complains of cough and SOB when she increased Losartan to 100 mg. No other symptoms or signs of anaphylaxis.      Outpatient Medications Prior to Visit  Medication Sig Dispense Refill  . carvedilol (COREG) 6.25 MG tablet Take 1 tablet (6.25 mg total) by mouth 2 (two) times daily. 60 tablet 5  . furosemide (LASIX) 40 MG tablet Take 1 tablet (40 mg total) by mouth daily. 30 tablet 5  . losartan (COZAAR) 100 MG tablet Take 1 tablet (100 mg total) by mouth daily. 90 tablet 3  . potassium chloride (K-DUR) 10 MEQ tablet Take 1 tablet (10 mEq total) by mouth daily. 90 tablet 3  . Cholecalciferol (VITAMIN D) 2000 units tablet Take 1 tablet (2,000 Units total) by mouth daily. (Patient not taking: Reported on 02/04/2017) 90 tablet 1  . cyclobenzaprine (FLEXERIL) 10 MG tablet Take 1 tablet (10 mg total) by mouth at bedtime. (Patient not taking: Reported on 02/04/2017) 14 tablet 0   No facility-administered medications prior to visit.      ROS Review of Systems  Constitutional: Negative for chills, fever and malaise/fatigue.  Eyes: Negative for blurred vision.  Respiratory: Positive for cough and shortness of breath.   Cardiovascular: Negative for chest pain and palpitations.  Gastrointestinal: Negative for abdominal pain and nausea.  Genitourinary: Negative for dysuria and hematuria.  Musculoskeletal: Positive for back  pain. Negative for joint pain and myalgias.  Skin: Negative for rash.  Neurological: Negative for tingling and headaches.  Psychiatric/Behavioral: Negative for depression. The patient is not nervous/anxious.     Objective:  BP 130/77 (BP Location: Left Arm, Patient Position: Sitting, Cuff Size: Normal)   Pulse 73   Temp 97.8 F (36.6 C) (Oral)   Wt 165 lb 3.2 oz (74.9 kg)   LMP 01/28/2017 (Approximate)   SpO2 99%   BMI 27.07 kg/m   BP/Weight 02/04/2017 01/22/2017 10/30/3788  Systolic BP 240 973 532  Diastolic BP 77 77 83  Wt. (Lbs) 165.2 161.2 161.8  BMI 27.07 26.42 26.52      Physical Exam  Constitutional: She is oriented to person, place, and time.  Well developed, well nourished, NAD, polite  HENT:  Head: Normocephalic and atraumatic.  Eyes: No scleral icterus.  Cardiovascular: Normal rate, regular rhythm and normal heart sounds.   Pulmonary/Chest: Effort normal and breath sounds normal. No respiratory distress. She has no wheezes. She has no rales.  Abdominal: Soft. Bowel sounds are normal. There is no tenderness.  Musculoskeletal: She exhibits no edema.  Increased muscular tonicity of the lumbar paraspinals.  Neurological: She is alert and oriented to person, place, and time. No cranial nerve deficit. Coordination normal.  Gait normal  Skin: Skin is warm and dry. No rash noted. No erythema. No pallor.  Psychiatric: She has a normal mood and affect. Her behavior is normal. Thought content normal.  Vitals reviewed.    Assessment &  Plan:   1. Repetitive strain injury of lower back, initial encounter - Ambulatory referral to Physical Therapy - Begin methocarbamol (ROBAXIN) 500 MG tablet; Take 1 tablet (500 mg total) by mouth 3 (three) times daily.  Dispense: 45 tablet; Refill: 0  2. Hypertension, unspecified type - Stop Losartan 100 mg - Begin amLODipine (NORVASC) 10 MG tablet; Take 1 tablet (10 mg total) by mouth daily.  Dispense: 90 tablet; Refill: 1   Meds  ordered this encounter  Medications  . amLODipine (NORVASC) 10 MG tablet    Sig: Take 1 tablet (10 mg total) by mouth daily.    Dispense:  90 tablet    Refill:  1    Order Specific Question:   Supervising Provider    Answer:   Tresa Garter [id number]  . methocarbamol (ROBAXIN) 500 MG tablet    Sig: Take 1 tablet (500 mg total) by mouth 3 (three) times daily.    Dispense:  45 tablet    Refill:  0    Order Specific Question:   Supervising Provider    Answer:   Tresa Garter [id number]    Follow-up: Return in about 4 weeks (around 03/04/2017) for HTN.   Clent Demark PA

## 2017-02-04 NOTE — Progress Notes (Signed)
Pt states since the increase of her losartan she now has been short of breath

## 2017-02-04 NOTE — Patient Instructions (Signed)
Muscle Strain A muscle strain (pulled muscle) happens when a muscle is stretched beyond normal length. It happens when a sudden, violent force stretches your muscle too far. Usually, a few of the fibers in your muscle are torn. Muscle strain is common in athletes. Recovery usually takes 1-2 weeks. Complete healing takes 5-6 weeks. Follow these instructions at home:  Follow the PRICE method of treatment to help your injury get better. Do this the first 2-3 days after the injury: ? Protect. Protect the muscle to keep it from getting injured again. ? Rest. Limit your activity and rest the injured body part. ? Ice. Put ice in a plastic bag. Place a towel between your skin and the bag. Then, apply the ice and leave it on from 15-20 minutes each hour. After the third day, switch to moist heat packs. ? Compression. Use a splint or elastic bandage on the injured area for comfort. Do not put it on too tightly. ? Elevate. Keep the injured body part above the level of your heart.  Only take medicine as told by your doctor.  Warm up before doing exercise to prevent future muscle strains. Contact a doctor if:  You have more pain or puffiness (swelling) in the injured area.  You feel numbness, tingling, or notice a loss of strength in the injured area. This information is not intended to replace advice given to you by your health care provider. Make sure you discuss any questions you have with your health care provider. Document Released: 04/08/2008 Document Revised: 12/06/2015 Document Reviewed: 01/27/2013 Elsevier Interactive Patient Education  2017 [employer] Inc[employer]  

## 2017-02-05 NOTE — Progress Notes (Signed)
Patient returned on 01/22/17 for A1C and thyroid workup.

## 2017-02-09 ENCOUNTER — Telehealth (INDEPENDENT_AMBULATORY_CARE_PROVIDER_SITE_OTHER): Payer: Self-pay | Admitting: Physician Assistant

## 2017-02-09 NOTE — Telephone Encounter (Signed)
Patient called stated needs rx for trich,  Send to [facility].  Please follow up with patient.

## 2017-02-09 NOTE — Telephone Encounter (Signed)
Spoke with Lurena Joiner at [facility], no medication ever sent for patient, please resend. Nat Christen, CMA

## 2017-02-10 ENCOUNTER — Other Ambulatory Visit (INDEPENDENT_AMBULATORY_CARE_PROVIDER_SITE_OTHER): Payer: Self-pay | Admitting: Physician Assistant

## 2017-02-10 DIAGNOSIS — A599 Trichomoniasis, unspecified: Secondary | ICD-10-CM

## 2017-02-10 MED ORDER — METRONIDAZOLE 500 MG PO TABS: 500.0000 mg | ORAL_TABLET | Freq: Two times a day (BID) | ORAL | 0 refills | Status: AC

## 2017-02-10 MED FILL — metroNIDAZOLE 500 MG TABS: 500 | 7 days supply | Qty: 14 | Fill #0

## 2017-02-10 NOTE — Telephone Encounter (Signed)
Please let patient know that metronidazole is at [facility] now.

## 2017-02-10 NOTE — Telephone Encounter (Signed)
Patient aware. Rachael Stone, CMA  

## 2017-02-13 ENCOUNTER — Ambulatory Visit (INDEPENDENT_AMBULATORY_CARE_PROVIDER_SITE_OTHER): Payer: Medicaid Other | Admitting: Physician Assistant

## 2017-02-19 ENCOUNTER — Telehealth (INDEPENDENT_AMBULATORY_CARE_PROVIDER_SITE_OTHER): Payer: Self-pay | Admitting: Physician Assistant

## 2017-02-19 ENCOUNTER — Other Ambulatory Visit (INDEPENDENT_AMBULATORY_CARE_PROVIDER_SITE_OTHER): Payer: Self-pay | Admitting: Physician Assistant

## 2017-02-19 DIAGNOSIS — S39012A Strain of muscle, fascia and tendon of lower back, initial encounter: Secondary | ICD-10-CM

## 2017-02-19 MED ORDER — METHOCARBAMOL 500 MG PO TABS: 500.0000 mg | ORAL_TABLET | Freq: Three times a day (TID) | ORAL | 0 refills | Status: DC

## 2017-02-19 MED FILL — METHOCARBAMOL 500 MG TABLET: 500 | 15 days supply | Qty: 45 | Fill #0

## 2017-02-19 NOTE — Telephone Encounter (Signed)
FWD to PCP. Tempestt S Roberts, CMA  

## 2017-02-19 NOTE — Telephone Encounter (Signed)
Left patient a message informing her that the Rx has been sent to [facility]. Nat Christen, CMA

## 2017-02-19 NOTE — Telephone Encounter (Signed)
Pt wants to know if PA Altamease Oiler can  Prescribe a muscle relaxer or she need an ov . Pt use [facility] . Please, call her thank you

## 2017-02-19 NOTE — Telephone Encounter (Signed)
I have sent Refill of Robaxin to [facility].

## 2017-02-23 NOTE — Telephone Encounter (Signed)
Patient called requesting refill on potassium chloride.

## 2017-02-24 ENCOUNTER — Other Ambulatory Visit (INDEPENDENT_AMBULATORY_CARE_PROVIDER_SITE_OTHER): Payer: Self-pay | Admitting: Physician Assistant

## 2017-02-24 DIAGNOSIS — Z760 Encounter for issue of repeat prescription: Secondary | ICD-10-CM

## 2017-02-24 MED ORDER — POTASSIUM CHLORIDE ER 10 MEQ PO TBCR: 10.0000 meq | EXTENDED_RELEASE_TABLET | Freq: Every day | ORAL | 3 refills | Status: DC

## 2017-02-24 NOTE — Telephone Encounter (Signed)
Potassium refilled. Please notify patient.

## 2017-02-24 NOTE — Telephone Encounter (Signed)
Left patient a voicemail informing her that Rx has been sent to [facility], [place]

## 2017-02-25 MED FILL — POTASSIUM CL 10 MEQ TAB SA: 10 | 30 days supply | Qty: 30 | Fill #0

## 2017-03-17 MED FILL — AMLODIPINE BESYLATE 10 MG T: 10 | 30 days supply | Qty: 30 | Fill #1

## 2017-03-19 ENCOUNTER — Ambulatory Visit: Payer: Medicaid Other | Admitting: Physical Therapy

## 2017-03-20 ENCOUNTER — Encounter (INDEPENDENT_AMBULATORY_CARE_PROVIDER_SITE_OTHER): Payer: Self-pay | Admitting: Physician Assistant

## 2017-03-20 ENCOUNTER — Ambulatory Visit (INDEPENDENT_AMBULATORY_CARE_PROVIDER_SITE_OTHER): Payer: Self-pay | Admitting: Physician Assistant

## 2017-03-20 VITALS — BP 117/72 | HR 98 | Temp 97.9°F | Resp 18 | Ht 67.0 in | Wt 158.0 lb

## 2017-03-20 DIAGNOSIS — Z5321 Procedure and treatment not carried out due to patient leaving prior to being seen by health care provider: Secondary | ICD-10-CM

## 2017-03-20 NOTE — Progress Notes (Signed)
   Subjective:  Patient ID: Rachael Stone, female    DOB: January 15, 1966  Age: 51 y.o. MRN: [id number]  CC: f/u   HPI  Rachael Stone is a 51 y.o. female with a PMH of HTN, cardiomyopathy, aortic insufficiency, mitral regurgitation, ETOH abuse, tobacco abuse, and marijuana abuse presents to f/u on HTN and muscle strain.        ROS ROS  Objective:  BP 117/72 (BP Location: Left Arm, Patient Position: Sitting, Cuff Size: Normal)   Pulse 98   Temp 97.9 F (36.6 C) (Oral)   Resp 18   Ht 5\' 7"  (1.702 m)   Wt 158 lb (71.7 kg)   LMP 03/03/2017   SpO2 100%   BMI 24.75 kg/m   BP/Weight 03/20/2017 02/04/2017 4/40/3474  Systolic BP 259 563 875  Diastolic BP 72 77 77  Wt. (Lbs) 158 165.2 161.2  BMI 24.75 27.07 26.42      Physical Exam   Assessment & Plan:    1. Patient left without being seen  - Patient left after waiting in exam room for approximately 20 minutes. She had to leave because she has to get ready for a trip to the beach today at 1:00 p.m.  Seems her muscle strain has resolved.  - Her vitals were reviewed after she left and she is doing much better on her new antihypertensive.  Follow-up: No Follow-up on file.   Clent Demark PA

## 2017-03-27 ENCOUNTER — Emergency Department (HOSPITAL_COMMUNITY): Payer: Medicaid Other

## 2017-03-27 ENCOUNTER — Emergency Department (HOSPITAL_COMMUNITY)
Admission: EM | Admit: 2017-03-27 | Discharge: 2017-03-28 | Disposition: A | Discharge status: Home / Self Care | Payer: Medicaid Other | Attending: Emergency Medicine | Admitting: Emergency Medicine

## 2017-03-27 ENCOUNTER — Encounter (HOSPITAL_COMMUNITY): Payer: Self-pay

## 2017-03-27 DIAGNOSIS — I110 Hypertensive heart disease with heart failure: Secondary | ICD-10-CM | POA: Insufficient documentation

## 2017-03-27 DIAGNOSIS — R0602 Shortness of breath: Secondary | ICD-10-CM | POA: Insufficient documentation

## 2017-03-27 DIAGNOSIS — Z79899 Other long term (current) drug therapy: Secondary | ICD-10-CM | POA: Insufficient documentation

## 2017-03-27 DIAGNOSIS — I509 Heart failure, unspecified: Secondary | ICD-10-CM | POA: Insufficient documentation

## 2017-03-27 DIAGNOSIS — R0789 Other chest pain: Secondary | ICD-10-CM | POA: Insufficient documentation

## 2017-03-27 DIAGNOSIS — Z7982 Long term (current) use of aspirin: Secondary | ICD-10-CM | POA: Insufficient documentation

## 2017-03-27 DIAGNOSIS — F17210 Nicotine dependence, cigarettes, uncomplicated: Secondary | ICD-10-CM | POA: Insufficient documentation

## 2017-03-27 DIAGNOSIS — R110 Nausea: Secondary | ICD-10-CM | POA: Insufficient documentation

## 2017-03-27 LAB — CBC
HCT: 43.1 % (ref 36.0–46.0)
Hemoglobin: 14.8 g/dL (ref 12.0–15.0)
MCH: 29.5 pg (ref 26.0–34.0)
MCHC: 34.3 g/dL (ref 30.0–36.0)
MCV: 85.9 fL (ref 78.0–100.0)
PLATELETS: 209 10*3/uL (ref 150–400)
RBC: 5.02 MIL/uL (ref 3.87–5.11)
RDW: 15.9 % — AB (ref 11.5–15.5)
WBC: 11.8 10*3/uL — AB (ref 4.0–10.5)

## 2017-03-27 LAB — BASIC METABOLIC PANEL
Anion gap: 11 (ref 5–15)
CALCIUM: 8.4 mg/dL — AB (ref 8.9–10.3)
CHLORIDE: 104 mmol/L (ref 101–111)
CO2: 20 mmol/L — ABNORMAL LOW (ref 22–32)
CREATININE: 0.85 mg/dL (ref 0.44–1.00)
GFR calc Af Amer: >60 mL/min (ref >60)
Glucose, Bld: 137 mg/dL — ABNORMAL HIGH (ref 65–99)
Potassium: 3.1 mmol/L — ABNORMAL LOW (ref 3.5–5.1)
SODIUM: 135 mmol/L (ref 135–145)

## 2017-03-27 LAB — I-STAT TROPONIN, ED
TROPONIN I, POC: 0 ng/mL (ref 0.00–0.08)
Troponin i, poc: 0 ng/mL (ref 0.00–0.08)

## 2017-03-27 LAB — MAGNESIUM: Magnesium: 1.7 mg/dL (ref 1.7–2.4)

## 2017-03-27 LAB — BRAIN NATRIURETIC PEPTIDE: B NATRIURETIC PEPTIDE 5: 31.5 pg/mL (ref 0.0–100.0)

## 2017-03-27 MED ORDER — ASPIRIN 81 MG PO CHEW
324.0000 mg | CHEWABLE_TABLET | Freq: Once | ORAL | Status: DC
  Filled 2017-03-27: qty 4

## 2017-03-27 MED ORDER — POTASSIUM CHLORIDE CRYS ER 20 MEQ PO TBCR
40.0000 meq | EXTENDED_RELEASE_TABLET | Freq: Once | ORAL | Status: AC
  Administered 2017-03-28: 40 meq via ORAL
  Filled 2017-03-27: qty 2

## 2017-03-27 MED ORDER — MORPHINE SULFATE (PF) 4 MG/ML IV SOLN
4.0000 mg | Freq: Once | INTRAVENOUS | Status: AC
  Administered 2017-03-27: 4 mg via INTRAVENOUS
  Filled 2017-03-27: qty 1

## 2017-03-27 MED ORDER — FAMOTIDINE 20 MG PO TABS: 20.0000 mg | ORAL_TABLET | Freq: Two times a day (BID) | ORAL | 0 refills | Status: DC

## 2017-03-27 MED ORDER — GI COCKTAIL ~~LOC~~
30.0000 mL | Freq: Once | ORAL | Status: AC
  Administered 2017-03-27: 30 mL via ORAL
  Filled 2017-03-27: qty 30

## 2017-03-27 MED ORDER — ONDANSETRON HCL 4 MG/2ML IJ SOLN
4.0000 mg | Freq: Once | INTRAMUSCULAR | Status: AC
  Administered 2017-03-27: 4 mg via INTRAVENOUS
  Filled 2017-03-27: qty 2

## 2017-03-27 NOTE — ED Triage Notes (Signed)
Pt presents to the ed with complaints of severe chest pressure and shortness of breath that started at 1400. Pt states it is a pressure that gets numb and radiates into both arms. Pt also endorses diaphoresis. Pt is very uncomfortable and tearful in triage.

## 2017-03-27 NOTE — ED Provider Notes (Signed)
[facility] DEPT Provider Note   CSN: [id number] Arrival date & time: 03/27/17  1832     History   Chief Complaint Chief Complaint  Patient presents with  . Chest Pain    HPI   Blood pressure (!) 167/95, pulse 84, temperature 97.8 F (36.6 C), resp. rate (!) 25, weight 71.7 kg (158 lb), last menstrual period 03/03/2017, SpO2 100 %.  Rachael Stone is a 51 y.o. female complaining of Acute onset of pressure-like retrosternal chest pain from the xiphoid to the throat at 2 PM today while she was working. Patient works as a Scientist, water quality. It's associated with nausea and shortness of breath with no cough, palpitations, syncope. She is a smoker, borderline diabetic, hypertension,  she denies hyperlipidemia, history of DVT/PE. Does not take a daily aspirin. She tried Pepto-Bismol and back and body pain medication with little relief today. Cannot identify any exacerbating or alleviating factors; She specifically denies exacerbation with exertion.  Past Medical History:  Diagnosis Date  . Back pain   . Cardiomyopathy ([place])    a. 04/08/2016 Echo: EF 35%, diff HK, mild LVH, Gr2 DD, mod AI, mod to sev MR, mildly dil LA.  [name] ETOH abuse   . Hypertensive heart disease with heart failure ([place])   . Marijuana abuse   . Moderate aortic insufficiency    a. 04/08/2016 Echo: mod AI.  [name] Moderate to Severe Mitral Regurgitation    a. 04/08/2016 Echo: mod-sev MR directed centrally.  . Tobacco abuse     Patient Active Problem List   Diagnosis Date Noted  . BV (bacterial vaginosis) 01/26/2017  . Trichomonal infection 01/26/2017  . Essential hypertension 11/14/2016  . Normal coronary arteries 04/11/2016  . Hypertensive heart disease with heart failure ([place])   . NICM (nonischemic cardiomyopathy) ([place])   . Moderate aortic insufficiency   . Moderate to Severe Mitral Regurgitation   . Tobacco abuse   . Acute pulmonary edema ([place]) 04/08/2016  . Elevated blood pressure 04/08/2016  . Dyspnea   . Acute  combined systolic and diastolic heart failure ([place]) 04/07/2016  . VAGINAL DISCHARGE 09/10/2009  . CHEST PAIN UNSPECIFIED 08/13/2009  . DEPRESSIVE DISORDER NOT ELSEWHERE CLASSIFIED 12/14/2008  . MENORRHAGIA 12/14/2008  . NAUSEA, CHRONIC 12/14/2008  . ELEVATED BLOOD PRESSURE WITHOUT DIAGNOSIS OF HYPERTENSION 12/14/2008  . DYSFUNCTIONAL UTERINE BLEEDING 11/12/2007  . TOBACCO DEPENDENCE 09/10/2006  . GASTROESOPHAGEAL REFLUX, NO ESOPHAGITIS 09/10/2006     Past Surgical History:  Procedure Laterality Date  . CARDIAC CATHETERIZATION N/A 04/10/2016   Procedure: Right/Left Heart Cath and Coronary Angiography;  Surgeon: [name], MD;  Location: [facility];  Service: Cardiovascular;  Laterality: N/A;  . EXPLORATORY LAPAROTOMY  2003   Ectopic Pregnancy - unsure which side or if tube was removed  . TUBAL LIGATION      OB History    Gravida Para Term Preterm AB Living   3 2 0 0 1 2   SAB TAB Ectopic Multiple Live Births   0 0 1 0         Home Medications    Prior to Admission medications   Medication Sig Start Date End Date Taking? Authorizing Provider  amLODipine (NORVASC) 10 MG tablet Take 1 tablet (10 mg total) by mouth daily. 02/04/17  Yes Clent Demark, PA-C  Aspirin-Caffeine (BACK & BODY EXTRA STRENGTH PO) Take 1 tablet by mouth every 6 (six) hours as needed (pain).   Yes [provider]  carvedilol (COREG) 6.25 MG tablet Take  1 tablet (6.25 mg total) by mouth 2 (two) times daily. 01/22/17  Yes Clent Demark, PA-C  furosemide (LASIX) 40 MG tablet Take 1 tablet (40 mg total) by mouth daily. 01/22/17  Yes Clent Demark, PA-C  potassium chloride (K-DUR) 10 MEQ tablet Take 1 tablet (10 mEq total) by mouth daily. 02/24/17 05/25/17 Yes Clent Demark, PA-C  famotidine (PEPCID) 20 MG tablet Take 1 tablet (20 mg total) by mouth 2 (two) times daily. 03/27/17   Murel Shenberger, Elmyra Ricks, PA-C  methocarbamol (ROBAXIN) 500 MG tablet Take 1 tablet (500 mg total) by  mouth 3 (three) times daily. Patient not taking: Reported on 03/27/2017 02/19/17   Clent Demark, PA-C    Family History Family History  Problem Relation Age of Onset  . CAD Mother        First MI @ 51 - 7 total.  . Cirrhosis Father        alcoholic - died in his 40'N.  . Lupus Sister     Social History Social History  Substance Use Topics  . Smoking status: Current Every Day Smoker    Packs/day: 0.50    Years: 15.00    Types: Cigarettes  . Smokeless tobacco: Never Used  . Alcohol use Yes     Comment: At least 12 ounces of homemade moon shine every other day.     Allergies   Losartan and Ibuprofen   Review of Systems Review of Systems  A complete review of systems was obtained and all systems are negative except as noted in the HPI and PMH.   Physical Exam Updated Vital Signs BP (!) 151/78   Pulse 84   Temp 97.8 F (36.6 C)   Resp 13   Wt 71.7 kg (158 lb)   LMP 03/03/2017   SpO2 100%   BMI 24.75 kg/m   Physical Exam  Constitutional: She is oriented to person, place, and time. She appears well-developed and well-nourished. No distress.  Agitated, hyperventilating  HENT:  Head: Normocephalic and atraumatic.  Mouth/Throat: Oropharynx is clear and moist.  Eyes: Pupils are equal, round, and reactive to light. Conjunctivae and EOM are normal.  Neck: Normal range of motion. No JVD present. No tracheal deviation present.  Cardiovascular: Normal rate, regular rhythm and intact distal pulses.   Radial pulse equal bilaterally  Pulmonary/Chest: Effort normal and breath sounds normal. No stridor. No respiratory distress. She has no wheezes. She has no rales. She exhibits no tenderness.  Abdominal: Soft. She exhibits no distension and no mass. There is no tenderness. There is no rebound and no guarding.  Musculoskeletal: Normal range of motion. She exhibits no edema or tenderness.  No calf asymmetry, superficial collaterals, palpable cords, edema, Homans sign  negative bilaterally.    Neurological: She is alert and oriented to person, place, and time.  Skin: Skin is warm. She is not diaphoretic.  Psychiatric: She has a normal mood and affect.  Nursing note and vitals reviewed.    ED Treatments / Results  Labs (all labs ordered are listed, but only abnormal results are displayed) Labs Reviewed  BASIC METABOLIC PANEL - Abnormal; Notable for the following:       Result Value   Potassium 3.1 (*)    CO2 20 (*)    Glucose, Bld 137 (*)    BUN <5 (*)    Calcium 8.4 (*)    All other components within normal limits  CBC - Abnormal; Notable for the following:    WBC  11.8 (*)    RDW 15.9 (*)    All other components within normal limits  BRAIN NATRIURETIC PEPTIDE  MAGNESIUM  I-STAT TROPONIN, ED  I-STAT TROPONIN, ED    EKG  EKG Interpretation  Date/Time:  Friday March 27 2017 18:49:18 EDT Ventricular Rate:  80 PR Interval:  128 QRS Duration: 86 QT Interval:  410 QTC Calculation: 472 R Axis:   37 Text Interpretation:  Normal sinus rhythm T wave abnormality, consider inferior ischemia Prolonged QT Abnormal ECG When compared to priorl, no significant changes seen,  no STEMI Confirmed by Antony Blackbird [phone]) on 03/27/2017 7:55:29 PM       Radiology Dg Chest 2 View  Result Date: 03/27/2017 CLINICAL DATA:  Chest pressure radiating to both arms beginning 6 hours ago. Shortness of breath. EXAM: CHEST  2 VIEW COMPARISON:  04/06/2016 FINDINGS: Heart and mediastinal shadows are normal given the AP technique. The lungs are clear. The vascularity is normal. No effusions. No abnormal bone finding. IMPRESSION: No active cardiopulmonary disease. Electronically Signed   By: Nelson Chimes M.D.   On: 03/27/2017 20:34    Procedures Procedures (including critical care time)  Medications Ordered in ED Medications  aspirin chewable tablet 324 mg (324 mg Oral Not Given 03/27/17 2321)  potassium chloride SA (K-DUR,KLOR-CON) CR tablet 40 mEq (not  administered)  morphine 4 MG/ML injection 4 mg (4 mg Intravenous Given 03/27/17 2211)  ondansetron (ZOFRAN) injection 4 mg (4 mg Intravenous Given 03/27/17 2210)  gi cocktail (Maalox,Lidocaine,Donnatal) (30 mLs Oral Given 03/27/17 2319)     Initial Impression / Assessment and Plan / ED Course  I have reviewed the triage vital signs and the nursing notes.  Pertinent labs & imaging results that were available during my care of the patient were reviewed by me and considered in my medical decision making (see chart for details).     Vitals:   03/27/17 2200 03/27/17 2215 03/27/17 2230 03/27/17 2245  BP: (!) 152/75 (!) 157/74 (!) 150/79 (!) 151/78  Pulse:      Resp: (!) 25 (!) 25 20 13   Temp:      SpO2:      Weight:        Medications  aspirin chewable tablet 324 mg (324 mg Oral Not Given 03/27/17 2321)  potassium chloride SA (K-DUR,KLOR-CON) CR tablet 40 mEq (not administered)  morphine 4 MG/ML injection 4 mg (4 mg Intravenous Given 03/27/17 2211)  ondansetron (ZOFRAN) injection 4 mg (4 mg Intravenous Given 03/27/17 2210)  gi cocktail (Maalox,Lidocaine,Donnatal) (30 mLs Oral Given 03/27/17 2319)    Rachael Stone is 51 y.o. female presenting with Chest pain, extremely hyperventilating on my exam. EKG with no acute findings, initial troponin negative, mild hypokalemia. Will check a magnesium. Aspirin and morphine given. Patient low risk by heart score. She had a nonobstructive cath 1 year ago. That Did show a cardiomyopathy with an EF of 35%. She states that she does not follow regularly with cardiology.  Chest x-ray without infiltrate or overt CHF.  Patient is much calmer on my reexam she states the pain has completely resolved and she declines aspirin because she feels like it was probably gas.  Will check delta troponin.  Delta troponin negative, patient remains chest pain-free. Will follow closely with primary care.  Evaluation does not show pathology that would require ongoing  emergent intervention or inpatient treatment. Pt is hemodynamically stable and mentating appropriately. Discussed findings and plan with patient/guardian, who agrees with care plan. All questions  answered. Return precautions discussed and outpatient follow up given.    Final Clinical Impressions(s) / ED Diagnoses   Final diagnoses:  Atypical chest pain    New Prescriptions New Prescriptions   FAMOTIDINE (PEPCID) 20 MG TABLET    Take 1 tablet (20 mg total) by mouth 2 (two) times daily.     Monico Blitz, Hershal Coria 03/28/17 0003    Tegeler, Gwenyth Allegra, MD 03/28/17 [phone]

## 2017-03-27 NOTE — Discharge Instructions (Signed)
Please follow with your primary care doctor in the next 2 days for a check-up. They must obtain records for further management.  ° °Do not hesitate to return to the Emergency Department for any new, worsening or concerning symptoms.  ° °

## 2017-03-28 NOTE — ED Notes (Signed)
Pt st's she feels a lot better and chest pain has subsided

## 2017-03-29 ENCOUNTER — Emergency Department (HOSPITAL_COMMUNITY)
Admission: EM | Admit: 2017-03-29 | Discharge: 2017-03-30 | Disposition: A | Discharge status: Home / Self Care | Payer: Medicaid Other | Attending: Emergency Medicine | Admitting: Emergency Medicine

## 2017-03-29 ENCOUNTER — Encounter (HOSPITAL_COMMUNITY): Payer: Self-pay | Admitting: Emergency Medicine

## 2017-03-29 ENCOUNTER — Emergency Department (HOSPITAL_COMMUNITY): Payer: Medicaid Other

## 2017-03-29 DIAGNOSIS — R110 Nausea: Secondary | ICD-10-CM | POA: Insufficient documentation

## 2017-03-29 DIAGNOSIS — F17210 Nicotine dependence, cigarettes, uncomplicated: Secondary | ICD-10-CM | POA: Insufficient documentation

## 2017-03-29 DIAGNOSIS — Z760 Encounter for issue of repeat prescription: Secondary | ICD-10-CM

## 2017-03-29 DIAGNOSIS — R0602 Shortness of breath: Secondary | ICD-10-CM | POA: Insufficient documentation

## 2017-03-29 DIAGNOSIS — R12 Heartburn: Secondary | ICD-10-CM | POA: Insufficient documentation

## 2017-03-29 DIAGNOSIS — I110 Hypertensive heart disease with heart failure: Secondary | ICD-10-CM | POA: Insufficient documentation

## 2017-03-29 DIAGNOSIS — I5041 Acute combined systolic (congestive) and diastolic (congestive) heart failure: Secondary | ICD-10-CM | POA: Insufficient documentation

## 2017-03-29 DIAGNOSIS — Z79899 Other long term (current) drug therapy: Secondary | ICD-10-CM | POA: Insufficient documentation

## 2017-03-29 LAB — CBC WITH DIFFERENTIAL/PLATELET
BASOS PCT: 0 %
Basophils Absolute: 0 10*3/uL (ref 0.0–0.1)
EOS ABS: 0.3 10*3/uL (ref 0.0–0.7)
EOS PCT: 3 %
HCT: 48.1 % — ABNORMAL HIGH (ref 36.0–46.0)
HEMOGLOBIN: 16.9 g/dL — AB (ref 12.0–15.0)
Lymphocytes Relative: 19 %
Lymphs Abs: 2 10*3/uL (ref 0.7–4.0)
MCH: 29.6 pg (ref 26.0–34.0)
MCHC: 35.1 g/dL (ref 30.0–36.0)
MCV: 84.4 fL (ref 78.0–100.0)
MONOS PCT: 9 %
Monocytes Absolute: 0.9 10*3/uL (ref 0.1–1.0)
NEUTROS PCT: 69 %
Neutro Abs: 7.4 10*3/uL (ref 1.7–7.7)
PLATELETS: 218 10*3/uL (ref 150–400)
RBC: 5.7 MIL/uL — ABNORMAL HIGH (ref 3.87–5.11)
RDW: 15.5 % (ref 11.5–15.5)
WBC: 10.6 10*3/uL — AB (ref 4.0–10.5)

## 2017-03-29 LAB — BASIC METABOLIC PANEL
ANION GAP: 13 (ref 5–15)
BUN: 8 mg/dL (ref 6–20)
CALCIUM: 8.9 mg/dL (ref 8.9–10.3)
CO2: 23 mmol/L (ref 22–32)
Chloride: 100 mmol/L — ABNORMAL LOW (ref 101–111)
Creatinine, Ser: 0.62 mg/dL (ref 0.44–1.00)
GLUCOSE: 106 mg/dL — AB (ref 65–99)
Potassium: 2.8 mmol/L — ABNORMAL LOW (ref 3.5–5.1)
Sodium: 136 mmol/L (ref 135–145)

## 2017-03-29 LAB — BRAIN NATRIURETIC PEPTIDE: B Natriuretic Peptide: 134.8 pg/mL — ABNORMAL HIGH (ref 0.0–100.0)

## 2017-03-29 MED ORDER — MAGNESIUM SULFATE 50 % IJ SOLN: 2.0000 g | Freq: Once | INTRAMUSCULAR | Status: DC

## 2017-03-29 MED ORDER — POTASSIUM CHLORIDE CRYS ER 20 MEQ PO TBCR
40.0000 meq | EXTENDED_RELEASE_TABLET | Freq: Once | ORAL | Status: AC
  Administered 2017-03-30: 40 meq via ORAL
  Filled 2017-03-29: qty 2

## 2017-03-29 MED ORDER — ALBUTEROL SULFATE (2.5 MG/3ML) 0.083% IN NEBU: 5.0000 mg | INHALATION_SOLUTION | Freq: Once | RESPIRATORY_TRACT | Status: DC

## 2017-03-29 MED ORDER — MAGNESIUM SULFATE 2 GM/50ML IV SOLN
2.0000 g | Freq: Once | INTRAVENOUS | Status: AC
  Administered 2017-03-30: 2 g via INTRAVENOUS
  Filled 2017-03-29: qty 50

## 2017-03-29 MED ORDER — ONDANSETRON 4 MG PO TBDP: 4.0000 mg | ORAL_TABLET | Freq: Once | ORAL | Status: DC

## 2017-03-29 MED ORDER — LORAZEPAM 2 MG/ML IJ SOLN
1.0000 mg | Freq: Once | INTRAMUSCULAR | Status: AC
  Administered 2017-03-29: 1 mg via INTRAMUSCULAR
  Filled 2017-03-29: qty 1

## 2017-03-29 MED ORDER — POTASSIUM CHLORIDE 10 MEQ/100ML IV SOLN
10.0000 meq | Freq: Once | INTRAVENOUS | Status: AC
  Administered 2017-03-30: 10 meq via INTRAVENOUS
  Filled 2017-03-29: qty 100

## 2017-03-29 NOTE — ED Triage Notes (Signed)
Patient c/o shortness of breath with abdominal pain, nausea,and diarrhea since Friday. Seen at [facility] on Friday for same. Denies chest pain at this time.

## 2017-03-29 NOTE — ED Provider Notes (Signed)
[facility] DEPT Provider Note   CSN: [id number] Arrival date & time: 03/29/17  1343     History   Chief Complaint Chief Complaint  Patient presents with  . Shortness of Breath    HPI Rachael Stone is a 51 y.o. female w PMHx cardiomyopathy, HTN, aortic insufficiency, mitral regurgitation, presenting to ED for persistent intermittent SOB since last week. Pt was seen on Friday at [facility] ED, with normal workup including negative troponins and sent home with symptomatic management for GERD. Pt states since Friday, she continues to have SOB intermittently, not exertional, as well as heartburn and nausea without vomiting. She reports some anxiety. She denies HA, vision changes, abdominal pain, LE swelling, CP, vomiting, or any other symptoms. Per chart review, PCP notes suggest patient is noncompliant with medications.  The history is provided by the patient.    Past Medical History:  Diagnosis Date  . Back pain   . Cardiomyopathy ([place])    a. 04/08/2016 Echo: EF 35%, diff HK, mild LVH, Gr2 DD, mod AI, mod to sev MR, mildly dil LA.  [name] ETOH abuse   . Hypertensive heart disease with heart failure ([place])   . Marijuana abuse   . Moderate aortic insufficiency    a. 04/08/2016 Echo: mod AI.  [name] Moderate to Severe Mitral Regurgitation    a. 04/08/2016 Echo: mod-sev MR directed centrally.  . Tobacco abuse     Patient Active Problem List   Diagnosis Date Noted  . BV (bacterial vaginosis) 01/26/2017  . Trichomonal infection 01/26/2017  . Essential hypertension 11/14/2016  . Normal coronary arteries 04/11/2016  . Hypertensive heart disease with heart failure ([place])   . NICM (nonischemic cardiomyopathy) ([place])   . Moderate aortic insufficiency   . Moderate to Severe Mitral Regurgitation   . Tobacco abuse   . Acute pulmonary edema ([place]) 04/08/2016  . Elevated blood pressure 04/08/2016  . Dyspnea   . Acute combined systolic and diastolic heart failure ([place]) 04/07/2016  . VAGINAL DISCHARGE  09/10/2009  . CHEST PAIN UNSPECIFIED 08/13/2009  . DEPRESSIVE DISORDER NOT ELSEWHERE CLASSIFIED 12/14/2008  . MENORRHAGIA 12/14/2008  . NAUSEA, CHRONIC 12/14/2008  . ELEVATED BLOOD PRESSURE WITHOUT DIAGNOSIS OF HYPERTENSION 12/14/2008  . DYSFUNCTIONAL UTERINE BLEEDING 11/12/2007  . TOBACCO DEPENDENCE 09/10/2006  . GASTROESOPHAGEAL REFLUX, NO ESOPHAGITIS 09/10/2006    Past Surgical History:  Procedure Laterality Date  . CARDIAC CATHETERIZATION N/A 04/10/2016   Procedure: Right/Left Heart Cath and Coronary Angiography;  Surgeon: [name], MD;  Location: [facility];  Service: Cardiovascular;  Laterality: N/A;  . EXPLORATORY LAPAROTOMY  2003   Ectopic Pregnancy - unsure which side or if tube was removed  . TUBAL LIGATION      OB History    Gravida Para Term Preterm AB Living   3 2 0 0 1 2   SAB TAB Ectopic Multiple Live Births   0 0 1 0         Home Medications    Prior to Admission medications   Medication Sig Start Date End Date Taking? Authorizing Provider  amLODipine (NORVASC) 10 MG tablet Take 1 tablet (10 mg total) by mouth daily. 02/04/17   Clent Demark, PA-C  Aspirin-Caffeine (BACK & BODY EXTRA STRENGTH PO) Take 1 tablet by mouth every 6 (six) hours as needed (pain).    [provider]  carvedilol (COREG) 6.25 MG tablet Take 1 tablet (6.25 mg total) by mouth 2 (two) times daily. 01/22/17   Clent Demark,  PA-C  famotidine (PEPCID) 20 MG tablet Take 1 tablet (20 mg total) by mouth 2 (two) times daily. 03/27/17   Pisciotta, Elmyra Ricks, PA-C  furosemide (LASIX) 40 MG tablet Take 1 tablet (40 mg total) by mouth daily. 01/22/17   Clent Demark, PA-C  methocarbamol (ROBAXIN) 500 MG tablet Take 1 tablet (500 mg total) by mouth 3 (three) times daily. Patient not taking: Reported on 03/27/2017 02/19/17   Clent Demark, PA-C  potassium chloride (K-DUR) 10 MEQ tablet Take 1 tablet (10 mEq total) by mouth daily. 02/24/17 05/25/17  Clent Demark,  PA-C    Family History Family History  Problem Relation Age of Onset  . CAD Mother        First MI @ 28 - 79 total.  . Cirrhosis Father        alcoholic - died in his 93'J.  . Lupus Sister     Social History Social History  Substance Use Topics  . Smoking status: Current Every Day Smoker    Packs/day: 0.50    Years: 15.00    Types: Cigarettes  . Smokeless tobacco: Never Used  . Alcohol use Yes     Comment: At least 12 ounces of homemade moon shine every other day.     Allergies   Losartan and Ibuprofen   Review of Systems Review of Systems  Constitutional: Negative for chills and fever.  Eyes: Negative for visual disturbance.  Respiratory: Positive for shortness of breath. Negative for cough.   Cardiovascular: Negative for chest pain and leg swelling.  Gastrointestinal: Positive for abdominal pain (heartburn) and nausea. Negative for blood in stool and vomiting.  Genitourinary: Negative for dysuria and frequency.  Neurological: Negative for headaches.  All other systems reviewed and are negative.    Physical Exam Updated Vital Signs BP (!) 173/80 (BP Location: Left Arm)   Pulse 75   Temp 98.4 F (36.9 C) (Oral)   Resp 14   Ht 5\' 5"  (1.651 m)   Wt 72.1 kg (159 lb)   LMP 03/03/2017   SpO2 100%   BMI 26.46 kg/m   Physical Exam  Constitutional: She is oriented to person, place, and time. She appears well-developed and well-nourished. No distress.  Well-appearing  HENT:  Head: Normocephalic and atraumatic.  Mouth/Throat: Oropharynx is clear and moist.  Eyes: Pupils are equal, round, and reactive to light. Conjunctivae and EOM are normal.  Neck: Normal range of motion. Neck supple.  Cardiovascular: Normal rate, regular rhythm and intact distal pulses.  Exam reveals no gallop and no friction rub.   Pulmonary/Chest: Effort normal and breath sounds normal. No respiratory distress. She has no wheezes. She has no rales. She exhibits no tenderness.  Abdominal:  Soft. Bowel sounds are normal. She exhibits no distension. There is no tenderness. There is no rebound and no guarding.  Musculoskeletal: Normal range of motion.  No lower extremity edema  Neurological: She is alert and oriented to person, place, and time.  Skin: Skin is warm.  Psychiatric: She has a normal mood and affect. Her behavior is normal.  Nursing note and vitals reviewed.    ED Treatments / Results  Labs (all labs ordered are listed, but only abnormal results are displayed) Labs Reviewed  CBC WITH DIFFERENTIAL/PLATELET - Abnormal; Notable for the following:       Result Value   WBC 10.6 (*)    RBC 5.70 (*)    Hemoglobin 16.9 (*)    HCT 48.1 (*)  All other components within normal limits  BASIC METABOLIC PANEL  BRAIN NATRIURETIC PEPTIDE    EKG  EKG Interpretation  Date/Time:  Sunday March 29 2017 13:56:47 EDT Ventricular Rate:  77 PR Interval:    QRS Duration: 88 QT Interval:  422 QTC Calculation: 478 R Axis:   14 Text Interpretation:  Sinus rhythm Short PR interval Probable left ventricular hypertrophy Borderline T abnormalities, inferior leads Baseline wander in lead(s) I V2 No significant change since last tracing Confirmed by Duffy Bruce [phone]) on 03/29/2017 10:44:35 PM       Radiology Dg Chest 2 View  Result Date: 03/29/2017 CLINICAL DATA:  Pt c/o sob, cough x 3 days, states previous cardiac cath has shown "weakened arteries," family hx of CAD, MI's. EXAM: CHEST  2 VIEW COMPARISON:  03/27/2017 FINDINGS: Midline trachea. Normal heart size and mediastinal contours. No pleural effusion or pneumothorax. Minimal right hemidiaphragm elevation. Clear lungs. IMPRESSION: No acute cardiopulmonary disease. Electronically Signed   By: Abigail Miyamoto M.D.   On: 03/29/2017 14:36    Procedures Procedures (including critical care time)  Medications Ordered in ED Medications  LORazepam (ATIVAN) injection 1 mg (1 mg Intramuscular Given 03/29/17 2147)      Initial Impression / Assessment and Plan / ED Course  I have reviewed the triage vital signs and the nursing notes.  Pertinent labs & imaging results that were available during my care of the patient were reviewed by me and considered in my medical decision making (see chart for details).     Patient presenting for subsequent visit with intermittent shortness of breath that is not exertional. Patient seen on Friday at [facility] ER, with normal cardiac workup and discharged with symptomatic management for GERD. She is presenting today for persistent symptoms of shortness of breath and nausea, without chest pain. Lung sounds normal, no increased work of breathing, O2 saturation 100% on room air. Chest x-ray negative. EKG without ischemia or changes from baseline. No lower extremity edema. Hypertensive, however without HA, vision changes, urinary sx, or other concerning symptoms of end-organ damage. CBC without significant change from Friday. BNP and BMP pending. Pt with improvement in nausea after ativan, not in distress and sleeping comfortably. Plan for discharge if labs without significant abnormalities. Recommend patient follow up with her cardiologist for BP management. Care assumed by Ocie Cornfield, PA-C pending labs.  Patient discussed with Dr. Ellender Hose, who agrees with care plan.  Discussed results, findings, treatment and follow up. Patient advised of return precautions. Patient verbalized understanding and agreed with plan.  Final Clinical Impressions(s) / ED Diagnoses   Final diagnoses:  SOB (shortness of breath)  Nausea    New Prescriptions New Prescriptions   No medications on file     Russo, [place] N, PA-C 03/29/17 2307    Russo, [place] N, PA-C 03/29/17 2314    Duffy Bruce, MD 03/30/17 [phone]

## 2017-03-29 NOTE — Discharge Instructions (Addendum)
Please make sure you take your potassium supplements as prescribed. Her potassium is low in the ED. We'll need to have your potassium rechecked this week by her primary care doctor. Your test shows that your fluid level is up slightly. It is important that she follow-up with her cardiologist this week to have an outpatient echocardiogram performed. Please return to the ED if you develop any worsening shortness of breath, worsening chest pain or for any other new symptoms.  Please read instructions below. Schedule an appointment with your primary care provider/cardiologist to follow up on your visit today. Take pepcid as prescribed from your previous ER visit for your stomach upset. Return to the ER for chest pain with exertion, worsening shortness of breath, fever, or new or worsening symptoms.

## 2017-03-30 LAB — POCT I-STAT TROPONIN I: Troponin i, poc: 0 ng/mL (ref 0.00–0.08)

## 2017-03-30 LAB — TROPONIN I: Troponin I: <0.03 ng/mL (ref <0.03)

## 2017-03-30 MED ORDER — POTASSIUM CHLORIDE ER 20 MEQ PO TBCR: 10.0000 meq | EXTENDED_RELEASE_TABLET | Freq: Two times a day (BID) | ORAL | 0 refills | Status: DC

## 2017-03-30 MED ORDER — POTASSIUM CHLORIDE CRYS ER 20 MEQ PO TBCR
40.0000 meq | EXTENDED_RELEASE_TABLET | Freq: Once | ORAL | Status: AC
  Administered 2017-03-30: 40 meq via ORAL
  Filled 2017-03-30: qty 2

## 2017-03-30 MED ORDER — POTASSIUM CHLORIDE CRYS ER 20 MEQ PO TBCR: 20.0000 meq | EXTENDED_RELEASE_TABLET | Freq: Two times a day (BID) | ORAL | 0 refills | Status: DC

## 2017-03-30 MED FILL — POTASSIUM CL ER 20 MEQ TAB: 20 | 5 days supply | Qty: 10 | Fill #0

## 2017-03-30 NOTE — ED Provider Notes (Signed)
Care assumed from previous provider PA Virgina Jock. Please see their note for further details to include full history and physical. To summarize in short pt is a 51 year old female with a history of nonischemic cardiomyopathy last EF 35% 03/2016, aortic insufficiency, hypertension, mitral regurgitation that presents to the emergency Department today with complaints of intermittent shortness of breath that is nonexertional as well as heartburn and nausea without vomiting. Also reports diarrhea. Was seen in ED 2 days ago for same.. Case discussed, plan agreed upon. At care hand off pending at that time was BNP and BMP. Plan was to DC home if these were unremarkable. Patient felt much improved after the Ativan.  BNP is mildly elevated at 1:30. Hypokalemia of 2.8 noted. History of hypokalemia patient currently taking Lasix. She is not on potassium supplementation at home. Patient also appears to be hemoconcentrated with an mildly elevated WBC, hemoglobin.  Patient does not appear fluid overloaded. Chest x-ray was unremarkable. EKG shows no ischemic changes. Negative delta troponins. Patient was given IV and oral potassium. She also replaced with magnesium. Reviewed patient's prior heart cath and echo. She did have an EF of 35% due to nonischemic cardiomyopathy on 03/2016. She did have a heart cath at that time that showed normal coronary arteries.  The patient continues to feel much improved. She is able to ambulate in the ED with normal saturations. She feels ready for discharge. Encourage patient to follow with her cardiologist this week for an outpatient echocardiogram. Encouraged to continue taking her Lasix and drink plenty of fluids if she has persistent diarrhea.  Patient is overall well appearing nontoxic. Vital signs and reassuring. Repeat exam was unremarkable. Patient feels much improved and discharged.  Pt is hemodynamically stable, in NAD, & able to ambulate in the ED. Evaluation does not show pathology  that would require ongoing emergent intervention or inpatient treatment. I explained the diagnosis to the patient. Pain has been managed & has no complaints prior to dc. Pt is comfortable with above plan and is stable for discharge at this time. All questions were answered prior to disposition. Strict return precautions for f/u to the ED were discussed. Encouraged follow up with PCP.  Case discussed with Dr. Ellender Hose who is agreeable to the above plna.        Doristine Devoid, PA-C 03/30/17 0768    Duffy Bruce, MD 03/30/17 1344

## 2017-03-31 MED FILL — FAMOTIDINE 20 MG TABLET: 20 | 5 days supply | Qty: 10 | Fill #0

## 2017-04-07 ENCOUNTER — Inpatient Hospital Stay (INDEPENDENT_AMBULATORY_CARE_PROVIDER_SITE_OTHER): Payer: Medicaid Other | Admitting: Physician Assistant

## 2017-04-10 ENCOUNTER — Inpatient Hospital Stay (INDEPENDENT_AMBULATORY_CARE_PROVIDER_SITE_OTHER): Payer: Medicaid Other | Admitting: Physician Assistant

## 2017-05-19 ENCOUNTER — Encounter (INDEPENDENT_AMBULATORY_CARE_PROVIDER_SITE_OTHER): Payer: Self-pay | Admitting: Physician Assistant

## 2017-05-19 ENCOUNTER — Ambulatory Visit (INDEPENDENT_AMBULATORY_CARE_PROVIDER_SITE_OTHER): Payer: Medicaid Other | Admitting: Physician Assistant

## 2017-05-19 VITALS — BP 153/86 | HR 86 | Temp 97.8°F | Wt 155.0 lb

## 2017-05-19 DIAGNOSIS — R12 Heartburn: Secondary | ICD-10-CM

## 2017-05-19 DIAGNOSIS — B354 Tinea corporis: Secondary | ICD-10-CM

## 2017-05-19 DIAGNOSIS — B353 Tinea pedis: Secondary | ICD-10-CM

## 2017-05-19 DIAGNOSIS — I10 Essential (primary) hypertension: Secondary | ICD-10-CM

## 2017-05-19 DIAGNOSIS — M67432 Ganglion, left wrist: Secondary | ICD-10-CM

## 2017-05-19 DIAGNOSIS — F1020 Alcohol dependence, uncomplicated: Secondary | ICD-10-CM

## 2017-05-19 MED ORDER — TERBINAFINE HCL 250 MG PO TABS: 250.0000 mg | ORAL_TABLET | Freq: Every day | ORAL | 0 refills | Status: DC

## 2017-05-19 MED ORDER — OMEPRAZOLE 40 MG PO CPDR: 40.0000 mg | DELAYED_RELEASE_CAPSULE | Freq: Every day | ORAL | 3 refills | Status: DC

## 2017-05-19 MED FILL — TERBINAFINE HCL 250 MG TAB: 250 | 14 days supply | Qty: 14 | Fill #0

## 2017-05-19 MED FILL — OMEPRAZOLE DR 40 MG CAPSULE: 40 | 30 days supply | Qty: 30 | Fill #0

## 2017-05-19 NOTE — Progress Notes (Signed)
Subjective:  Patient ID: Rachael Stone, female    DOB: January 01, 1966  Age: 51 y.o. MRN: [id number]  CC: foot pain and rash  HPI  Rachael Stone is a 51 y.o. female with a PMH of HTN, cardiomyopathy, aortic insufficiency, mitral regurgitation, ETOH abuse, tobacco abuse, and marijuana abuse presents with bilateral foot itching and burning. Associated with "peeling" in between the toes. Onset a few weeks ago. May have been exposed to infected people while away at the beach. Also has a few ovoid patches on upper right chest and hypogastric region. These lesions are also pruritic.     Complains of epigastric discomfort. Previously relieved with Pepcid but ran out. Would like a refill. Drinks alcohol on a daily basis. Says she likes to drink but knows drinking may be causing harm to her body. Attributes drinking to the difficult environment she lives in to include poverty, poor housing conditions, and detrimental social influences.      Outpatient Medications Prior to Visit  Medication Sig Dispense Refill  . amLODipine (NORVASC) 10 MG tablet Take 1 tablet (10 mg total) by mouth daily. 90 tablet 1  . Aspirin-Caffeine (BACK & BODY EXTRA STRENGTH PO) Take 1 tablet by mouth every 6 (six) hours as needed (pain).    . carvedilol (COREG) 6.25 MG tablet Take 1 tablet (6.25 mg total) by mouth 2 (two) times daily. 60 tablet 5  . famotidine (PEPCID) 20 MG tablet Take 1 tablet (20 mg total) by mouth 2 (two) times daily. 10 tablet 0  . furosemide (LASIX) 40 MG tablet Take 1 tablet (40 mg total) by mouth daily. 30 tablet 5  . methocarbamol (ROBAXIN) 500 MG tablet Take 1 tablet (500 mg total) by mouth 3 (three) times daily. (Patient not taking: Reported on 03/27/2017) 45 tablet 0  . potassium chloride 20 MEQ TBCR Take 10 mEq by mouth 2 (two) times daily. 7 tablet 0  . potassium chloride SA (K-DUR,KLOR-CON) 20 MEQ tablet Take 1 tablet (20 mEq total) by mouth 2 (two) times daily. 10 tablet 0   No  facility-administered medications prior to visit.      ROS Review of Systems  Constitutional: Negative for chills, fever and malaise/fatigue.  Eyes: Negative for blurred vision.  Respiratory: Negative for shortness of breath.   Cardiovascular: Negative for chest pain and palpitations.  Gastrointestinal: Negative for abdominal pain and nausea.  Genitourinary: Negative for dysuria and hematuria.  Musculoskeletal: Negative for joint pain and myalgias.  Skin: Positive for itching and rash.  Neurological: Negative for tingling and headaches.  Psychiatric/Behavioral: Negative for depression. The patient is not nervous/anxious.     Objective:  Wt 155 lb (70.3 kg)   BMI 25.79 kg/m   BP/Weight 05/19/2017 03/30/2017 11/15/6977  Systolic BP - 480 -  Diastolic BP - 78 -  Wt. (Lbs) 155 - 159  BMI 25.79 - 26.46      Physical Exam  Constitutional: She is oriented to person, place, and time.  Well developed, somewhat centrally obese, NAD, polite  HENT:  Head: Normocephalic and atraumatic.  Eyes: Conjunctivae are normal. No scleral icterus.  Neck: Normal range of motion. Neck supple. No thyromegaly present.  Cardiovascular: Normal rate, regular rhythm and normal heart sounds.  Pulmonary/Chest: Effort normal and breath sounds normal.  Abdominal: Soft. Bowel sounds are normal. There is no tenderness.  Musculoskeletal: She exhibits no edema.  Neurological: She is alert and oriented to person, place, and time. No cranial nerve deficit. Coordination normal.  Skin: Skin  is warm and dry.  Fairly large, ovoid, scaled, and erythematous lesion on the right upper chest. Two similar appearing but smaller lesions in the hypogastric region. Mild desquamation of the skin of the soles of feet, worse interdigitally.  Psychiatric: Her behavior is normal. Thought content normal.  Tearful while I counseled her about alcohol abuse  Vitals reviewed.    Assessment & Plan:   1. Hypertension, unspecified  type - Pt reports not taking antihypertensives today. I have asked that she take medications as prescribed.   2. Tinea corporis - Begin terbinafine 250 mg qday x14 days  3. Tinea pedis of both feet - Begin terbinafine 250 mg qday x14 days.  4. Ganglion cyst of dorsum of left wrist - Will wait for patient to call and confirm her insurance coverage before making surgical referral.   5. Heartburn - Begin Omeprazole 40 mg qday  6. Alcoholism ([place]) - Pt is not ready to quit yet. I am trying to get her into the contemplation stage. She knows alcohol is harmful to her and will try to "slow down" her drinking. I have asked for her to return in four weeks to discuss possible treatment for her alcoholism.   Follow-up: Return in about 4 weeks (around 06/16/2017) for Tinea and alcoholism.   Clent Demark PA

## 2017-05-19 NOTE — Patient Instructions (Addendum)
Alcohol Abuse and Nutrition Alcohol abuse is any pattern of alcohol consumption that harms your health, relationships, or work. Alcohol abuse can affect how your body breaks down and absorbs nutrients from food by causing your liver to work abnormally. Additionally, many people who abuse alcohol do not eat enough carbohydrates, protein, fat, vitamins, and minerals. This can cause poor nutrition (malnutrition) and a lack of nutrients (nutrient deficiencies), which can lead to further complications. Nutrients that are commonly lacking (deficient) among people who abuse alcohol include:  Vitamins. ? Vitamin A. This is stored in your liver. It is important for your vision, metabolism, and ability to fight off infections (immunity). ? B vitamins. These include vitamins such as folate, thiamin, and niacin. These are important in new cell growth and maintenance. ? Vitamin C. This plays an important role in iron absorption, wound healing, and immunity. ? Vitamin D. This is produced by your liver, but you can also get vitamin D from food. Vitamin D is necessary for your body to absorb and use calcium.  Minerals. ? Calcium. This is important for your bones and your heart and blood vessel (cardiovascular) function. ? Iron. This is important for blood, muscle, and nervous system functioning. ? Magnesium. This plays an important role in muscle and nerve function, and it helps to control blood sugar and blood pressure. ? Zinc. This is important for the normal function of your nervous system and digestive system (gastrointestinal tract).  Nutrition is an essential component of therapy for alcohol abuse. Your health care provider or dietitian will work with you to design a plan that can help restore nutrients to your body and prevent potential complications. What is my plan? Your dietitian may develop a specific diet plan that is based on your condition and any other complications you may have. A diet plan will  commonly include:  A balanced diet. ? Grains: 6-8 oz per day. ? Vegetables: 2-3 cups per day. ? Fruits: 1-2 cups per day. ? Meat and other protein: 5-6 oz per day. ? Dairy: 2-3 cups per day.  Vitamin and mineral supplements.  What do I need to know about alcohol and nutrition?  Consume foods that are high in antioxidants, such as grapes, berries, nuts, green tea, and dark green and orange vegetables. This can help to counteract some of the stress that is placed on your liver by consuming alcohol.  Avoid food and drinks that are high in fat and sugar. Foods such as sugared soft drinks, salty snack foods, and candy contain empty calories. This means that they lack important nutrients such as protein, fiber, and vitamins.  Eat frequent meals and snacks. Try to eat 5-6 small meals each day.  Eat a variety of fresh fruits and vegetables each day. This will help you get plenty of water, fiber, and vitamins in your diet.  Drink plenty of water and other clear fluids. Try to drink at least 48-64 oz (1.5-2 L) of water per day.  If you are a vegetarian, eat a variety of protein-rich foods. Pair whole grains with plant-based proteins at meals and snacks to obtain the greatest nutrient benefit from your food. For example, eat rice with beans, put peanut butter on whole-grain toast, or eat oatmeal with sunflower seeds.  Soak beans and whole grains overnight before cooking. This can help your body to absorb the nutrients more easily.  Include foods fortified with vitamins and minerals in your diet. Commonly fortified foods include milk, orange juice, cereal, and bread.  If you are malnourished, your dietitian may recommend a high-protein, high-calorie diet. This may include: ? 2,000-3,000 calories (kilocalories) per day. ? 70-100 grams of protein per day.  Your health care provider may recommend a complete nutritional supplement beverage. This can help to restore calories, protein, and vitamins to  your body. Depending on your condition, you may be advised to consume this instead of or in addition to meals.  Limit your intake of caffeine. Replace drinks like coffee and black tea with decaffeinated coffee and herbal tea.  Eat a variety of foods that are high in omega fatty acids. These include fish, nuts and seeds, and soybeans. These foods may help your liver to recover and may also stabilize your mood.  Certain medicines may cause changes in your appetite, taste, and weight. Work with your health care provider and dietitian to make any adjustments to your medicines and diet plan.  Include other healthy lifestyle choices in your daily routine. ? Be physically active. ? Get enough sleep. ? Spend time doing activities that you enjoy.  If you are unable to take in enough food and calories by mouth, your health care provider may recommend a feeding tube. This is a tube that passes through your nose and throat, directly into your stomach. Nutritional supplement beverages can be given to you through the feeding tube to help you get the nutrients you need.  Take vitamin or mineral supplements as recommended by your health care provider. What foods can I eat? Grains Enriched pasta. Enriched rice. Fortified whole-grain bread. Fortified whole-grain cereal. Barley. Brown rice. Quinoa. [place]. Vegetables All fresh, frozen, and canned vegetables. Spinach. Kale. Artichoke. Carrots. Winter squash and pumpkin. Sweet potatoes. Broccoli. Cabbage. Cucumbers. Tomatoes. Sweet peppers. Green beans. Peas. Corn. Fruits All fresh and frozen fruits. Berries. Grapes. Mango. Papaya. Guava. Cherries. Apples. Bananas. Peaches. Plums. Pineapple. Watermelon. Cantaloupe. Oranges. Avocado. Meats and Other Protein Sources Beef liver. Lean beef. Pork. Fresh and canned chicken. Fresh fish. Oysters. Sardines. Canned tuna. Shrimp. Eggs with yolks. Nuts and seeds. Peanut butter. Beans and lentils. Soybeans.  Tofu. Dairy Whole, low-fat, and nonfat milk. Whole, low-fat, and nonfat yogurt. Cottage cheese. Sour cream. Hard and soft cheeses. Beverages Water. Herbal tea. Decaffeinated coffee. Decaffeinated green tea. 100% fruit juice. 100% vegetable juice. Instant breakfast shakes. Condiments Ketchup. Mayonnaise. Mustard. Salad dressing. Barbecue sauce. Sweets and Desserts Sugar-free ice cream. Sugar-free pudding. Sugar-free gelatin. Fats and Oils Butter. Vegetable oil, flaxseed oil, olive oil, and walnut oil. Other Complete nutrition shakes. Protein bars. Sugar-free gum. The items listed above may not be a complete list of recommended foods or beverages. Contact your dietitian for more options. What foods are not recommended? Grains Sugar-sweetened breakfast cereals. Flavored instant oatmeal. Fried breads. Vegetables Breaded or deep-fried vegetables. Fruits Dried fruit with added sugar. Candied fruit. Canned fruit in syrup. Meats and Other Protein Sources Breaded or deep-fried meats. Dairy Flavored milks. Fried cheese curds or fried cheese sticks. Beverages Alcohol. Sugar-sweetened soft drinks. Sugar-sweetened tea. Caffeinated coffee and tea. Condiments Sugar. Honey. Agave nectar. Molasses. Sweets and Desserts Chocolate. Cake. Cookies. Candy. Other Potato chips. Pretzels. Salted nuts. Candied nuts. The items listed above may not be a complete list of foods and beverages to avoid. Contact your dietitian for more information. This information is not intended to replace advice given to you by your health care provider. Make sure you discuss any questions you have with your health care provider. Document Released: 04/24/2005 Document Revised: 11/07/2015 Document Reviewed: 01/31/2014 Elsevier Interactive Patient Education  2018 [employer]. Hypertension Hypertension, commonly called high blood pressure, is when the force of blood pumping through the arteries is too strong. The arteries are  the blood vessels that carry blood from the heart throughout the body. Hypertension forces the heart to work harder to pump blood and may cause arteries to become narrow or stiff. Having untreated or uncontrolled hypertension can cause heart attacks, strokes, kidney disease, and other problems. A blood pressure reading consists of a higher number over a lower number. Ideally, your blood pressure should be below 120/80. The first ("top") number is called the systolic pressure. It is a measure of the pressure in your arteries as your heart beats. The second ("bottom") number is called the diastolic pressure. It is a measure of the pressure in your arteries as the heart relaxes. What are the causes? The cause of this condition is not known. What increases the risk? Some risk factors for high blood pressure are under your control. Others are not. Factors you can change  Smoking.  Having type 2 diabetes mellitus, high cholesterol, or both.  Not getting enough exercise or physical activity.  Being overweight.  Having too much fat, sugar, calories, or salt (sodium) in your diet.  Drinking too much alcohol. Factors that are difficult or impossible to change  Having chronic kidney disease.  Having a family history of high blood pressure.  Age. Risk increases with age.  Race. You may be at higher risk if you are African-American.  Gender. Men are at higher risk than women before age 18. After age 52, women are at higher risk than men.  Having obstructive sleep apnea.  Stress. What are the signs or symptoms? Extremely high blood pressure (hypertensive crisis) may cause:  Headache.  Anxiety.  Shortness of breath.  Nosebleed.  Nausea and vomiting.  Severe chest pain.  Jerky movements you cannot control (seizures).  How is this diagnosed? This condition is diagnosed by measuring your blood pressure while you are seated, with your arm resting on a surface. The cuff of the blood  pressure monitor will be placed directly against the skin of your upper arm at the level of your heart. It should be measured at least twice using the same arm. Certain conditions can cause a difference in blood pressure between your right and left arms. Certain factors can cause blood pressure readings to be lower or higher than normal (elevated) for a short period of time:  When your blood pressure is higher when you are in a health care provider's office than when you are at home, this is called white coat hypertension. Most people with this condition do not need medicines.  When your blood pressure is higher at home than when you are in a health care provider's office, this is called masked hypertension. Most people with this condition may need medicines to control blood pressure.  If you have a high blood pressure reading during one visit or you have normal blood pressure with other risk factors:  You may be asked to return on a different day to have your blood pressure checked again.  You may be asked to monitor your blood pressure at home for 1 week or longer.  If you are diagnosed with hypertension, you may have other blood or imaging tests to help your health care provider understand your overall risk for other conditions. How is this treated? This condition is treated by making healthy lifestyle changes, such as eating healthy foods, exercising more, and reducing  your alcohol intake. Your health care provider may prescribe medicine if lifestyle changes are not enough to get your blood pressure under control, and if:  Your systolic blood pressure is above 130.  Your diastolic blood pressure is above 80.  Your personal target blood pressure may vary depending on your medical conditions, your age, and other factors. Follow these instructions at home: Eating and drinking  Eat a diet that is high in fiber and potassium, and low in sodium, added sugar, and fat. An example eating plan is  called the DASH (Dietary Approaches to Stop Hypertension) diet. To eat this way: ? Eat plenty of fresh fruits and vegetables. Try to fill half of your plate at each meal with fruits and vegetables. ? Eat whole grains, such as whole wheat pasta, brown rice, or whole grain bread. Fill about one quarter of your plate with whole grains. ? Eat or drink low-fat dairy products, such as skim milk or low-fat yogurt. ? Avoid fatty cuts of meat, processed or cured meats, and poultry with skin. Fill about one quarter of your plate with lean proteins, such as fish, chicken without skin, beans, eggs, and tofu. ? Avoid premade and processed foods. These tend to be higher in sodium, added sugar, and fat.  Reduce your daily sodium intake. Most people with hypertension should eat less than 1,500 mg of sodium a day.  Limit alcohol intake to no more than 1 drink a day for nonpregnant women and 2 drinks a day for men. One drink equals 12 oz of beer, 5 oz of wine, or 1 oz of hard liquor. Lifestyle  Work with your health care provider to maintain a healthy body weight or to lose weight. Ask what an ideal weight is for you.  Get at least 30 minutes of exercise that causes your heart to beat faster (aerobic exercise) most days of the week. Activities may include walking, swimming, or biking.  Include exercise to strengthen your muscles (resistance exercise), such as pilates or lifting weights, as part of your weekly exercise routine. Try to do these types of exercises for 30 minutes at least 3 days a week.  Do not use any products that contain nicotine or tobacco, such as cigarettes and e-cigarettes. If you need help quitting, ask your health care provider.  Monitor your blood pressure at home as told by your health care provider.  Keep all follow-up visits as told by your health care provider. This is important. Medicines  Take over-the-counter and prescription medicines only as told by your health care provider.  Follow directions carefully. Blood pressure medicines must be taken as prescribed.  Do not skip doses of blood pressure medicine. Doing this puts you at risk for problems and can make the medicine less effective.  Ask your health care provider about side effects or reactions to medicines that you should watch for. Contact a health care provider if:  You think you are having a reaction to a medicine you are taking.  You have headaches that keep coming back (recurring).  You feel dizzy.  You have swelling in your ankles.  You have trouble with your vision. Get help right away if:  You develop a severe headache or confusion.  You have unusual weakness or numbness.  You feel faint.  You have severe pain in your chest or abdomen.  You vomit repeatedly.  You have trouble breathing. Summary  Hypertension is when the force of blood pumping through your arteries is too strong. If  this condition is not controlled, it may put you at risk for serious complications.  Your personal target blood pressure may vary depending on your medical conditions, your age, and other factors. For most people, a normal blood pressure is less than 120/80.  Hypertension is treated with lifestyle changes, medicines, or a combination of both. Lifestyle changes include weight loss, eating a healthy, low-sodium diet, exercising more, and limiting alcohol. This information is not intended to replace advice given to you by your health care provider. Make sure you discuss any questions you have with your health care provider. Document Released: 06/30/2005 Document Revised: 05/28/2016 Document Reviewed: 05/28/2016 Elsevier Interactive Patient Education  2018 [employer]. Body Ringworm Body ringworm is an infection of the skin that often causes a ring-shaped rash. Body ringworm can affect any part of your skin. It can spread easily to others. Body ringworm is also called tinea corporis. What are the causes? This  condition is caused by funguses called dermatophytes. The condition develops when these funguses grow out of control on the skin. You can get this condition if you touch a person or animal that has it. You can also get it if you share clothing, bedding, towels, or any other object with an infected person or pet. What increases the risk? This condition is more likely to develop in:  Athletes who often make skin-to-skin contact with other athletes, such as wrestlers.  People who share equipment and mats.  People with a weakened immune system.  What are the signs or symptoms? Symptoms of this condition include:  Itchy, raised red spots and bumps.  Red scaly patches.  A ring-shaped rash. The rash may have: ? A clear center. ? Scales or red bumps at its center. ? Redness near its borders. ? Dry and scaly skin on or around it.  How is this diagnosed? This condition can usually be diagnosed with a skin exam. A skin scraping may be taken from the affected area and examined under a microscope to see if the fungus is present. How is this treated? This condition may be treated with:  An antifungal cream or ointment.  An antifungal shampoo.  Antifungal medicines. These may be prescribed if your ringworm is severe, keeps coming back, or lasts a long time.  Follow these instructions at home:  Take over-the-counter and prescription medicines only as told by your health care provider.  If you were given an antifungal cream or ointment: ? Use it as told by your health care provider. ? Wash the infected area and dry it completely before applying the cream or ointment.  If you were given an antifungal shampoo: ? Use it as told by your health care provider. ? Leave the shampoo on your body for 3-5 minutes before rinsing.  While you have a rash: ? Wear loose clothing to stop clothes from rubbing and irritating it. ? Wash or change your bed sheets every night.  If your pet has the same  infection, take your pet to see a Animal nutritionist. How is this prevented?  Practice good hygiene.  Wear sandals or shoes in public places and showers.  Do not share personal items with others.  Avoid touching red patches of skin on other people.  Avoid touching pets that have bald spots.  If you touch an animal that has a bald spot, wash your hands. Contact a health care provider if:  Your rash continues to spread after 7 days of treatment.  Your rash is not gone in  4 weeks.  The area around your rash gets red, warm, tender, and swollen. This information is not intended to replace advice given to you by your health care provider. Make sure you discuss any questions you have with your health care provider. Document Released: 06/27/2000 Document Revised: 12/06/2015 Document Reviewed: 04/26/2015 Elsevier Interactive Patient Education  [employer].

## 2017-05-21 MED FILL — AMLODIPINE BESYLATE 10 MG T: 10 | 30 days supply | Qty: 30 | Fill #2

## 2017-05-26 MED FILL — FUROSEMIDE 40 MG TAB: 40 | 30 days supply | Qty: 30 | Fill #0

## 2017-06-02 ENCOUNTER — Ambulatory Visit (INDEPENDENT_AMBULATORY_CARE_PROVIDER_SITE_OTHER): Payer: Self-pay | Admitting: Cardiology

## 2017-06-02 ENCOUNTER — Encounter: Payer: Self-pay | Admitting: Cardiology

## 2017-06-02 VITALS — BP 114/78 | HR 79 | Ht 65.5 in | Wt 151.1 lb

## 2017-06-02 DIAGNOSIS — Z79899 Other long term (current) drug therapy: Secondary | ICD-10-CM

## 2017-06-02 DIAGNOSIS — I509 Heart failure, unspecified: Secondary | ICD-10-CM

## 2017-06-02 DIAGNOSIS — I10 Essential (primary) hypertension: Secondary | ICD-10-CM

## 2017-06-02 DIAGNOSIS — I351 Nonrheumatic aortic (valve) insufficiency: Secondary | ICD-10-CM

## 2017-06-02 LAB — BASIC METABOLIC PANEL
BUN / CREAT RATIO: 9 (ref 9–23)
BUN: 8 mg/dL (ref 6–24)
CHLORIDE: 103 mmol/L (ref 96–106)
CO2: 27 mmol/L (ref 20–29)
CREATININE: 0.92 mg/dL (ref 0.57–1.00)
Calcium: 8.8 mg/dL (ref 8.7–10.2)
GFR calc Af Amer: 85 mL/min/{1.73_m2} (ref >59)
GFR calc non Af Amer: 73 mL/min/{1.73_m2} (ref >59)
GLUCOSE: 92 mg/dL (ref 65–99)
Potassium: 3.6 mmol/L (ref 3.5–5.2)
Sodium: 141 mmol/L (ref 134–144)

## 2017-06-02 MED ORDER — CARVEDILOL 6.25 MG PO TABS: 6.2500 mg | ORAL_TABLET | Freq: Two times a day (BID) | ORAL | 3 refills | Status: DC

## 2017-06-02 MED ORDER — AMLODIPINE BESYLATE 5 MG PO TABS: 5.0000 mg | ORAL_TABLET | Freq: Every day | ORAL | 3 refills | Status: DC

## 2017-06-02 MED FILL — ?CARVEDILOL 6.25 MG TABLET: 6.25 | 30 days supply | Qty: 60 | Fill #0

## 2017-06-02 MED FILL — AMLODIPINE BESYLATE 5 MG TA: 5 | 30 days supply | Qty: 30 | Fill #0

## 2017-06-02 NOTE — Patient Instructions (Addendum)
Medication Instructions:    MAKE SURE YOUR  TAKING CARVEDILOL 6.25 TWICE A DAY   START TAKING NORVASC 5 MG ONCE A DAY   If you need a refill on your cardiac medications before your next appointment, please call your pharmacy.  Labwork:  BMET TODAY    Testing/Procedures:  Your physician has requested that you have an echocardiogram. Echocardiography is a painless test that uses sound waves to create images of your heart. It provides your doctor with information about the size and shape of your heart and how well your heart's chambers and valves are working. This procedure takes approximately one hour. There are no restrictions for this procedure.     Follow-Up: Your physician wants you to follow-up in:  IN  6  MONTHS WITH DR. Acie Fredrickson  You will receive a reminder letter in the mail two months in advance. If you don't receive a letter, please call our office to schedule the follow-up appointment.      Any Other Special Instructions Will Be Listed Below (If Applicable).

## 2017-06-02 NOTE — Progress Notes (Signed)
06/02/2017 Rachael Stone   05/13/1966  [id number]  Primary Physician Clent Demark, PA-C Primary Cardiologist: Dr. Acie Fredrickson   Reason for Visit/CC: F/u for Hypertensive and Valvular Heart Disease   HPI:  51 y/o AAF, followed by Dr. Acie Fredrickson, with a h/o combined systolic and diastolic HF, NICM, hypertensive and valvular heart disease. Also with a h/o tob, alcohol, marijuana, and remote cocaine abuse She was admitted 03/2016 with acute pulmonary edema. Treated with IV Lasix. 2D echo showed reduced LVEF down to 35% with moderate AI and moderate to severe MR. LHC was performed and showed normal coronaries. She was placed on medical therapy for systolic HF, however there have been issues with medical compliance.   She is here today for f/u. She is here alone. Per other clinic notes, she usually brings her sister w/ her who has helped with her meds. She notes that she has been doing well. She works at [employer]. On her feet all day. She denies any cardiac symptoms. No dyspnea, CP, palpitations, dizziness, syncope/ near syncope, orthopnea, PND or LEE. Her only complaint is hot flashes, which she thinks is related to menopause. She continues to smoke but plans to quit this week. She states she is down to her last pack and does not plan on buying any more cigarettes. She plans to buy nicotine gum to help with smoking cessation.   She reports full med compliance, but has not yet taken her morning meds. She wanted to wait until after her appt, given lasix causes frequent urination. She plans to take meds when she returns home.   BP is 114/78. HR 79. Meds reviewed. Some she has been taking incorrectly. She only takes coreg once daily as opposed to recommended BID dosing. Also only taking half of the supplemental K she has been ordered to take. She is due for f/u BMP today.   Current Meds  Medication Sig  . amLODipine (NORVASC) 10 MG tablet Take 1 tablet (10 mg total) by mouth daily.  .  Aspirin-Caffeine (BACK & BODY EXTRA STRENGTH PO) Take 1 tablet by mouth every 6 (six) hours as needed (pain).  . carvedilol (COREG) 6.25 MG tablet Take 1 tablet (6.25 mg total) by mouth 2 (two) times daily.  . furosemide (LASIX) 40 MG tablet Take 1 tablet (40 mg total) by mouth daily.  [name] omeprazole (PRILOSEC) 40 MG capsule Take 1 capsule (40 mg total) daily by mouth.  . Potassium Chloride ER 20 MEQ TBCR Take 10 mEq daily by mouth.  . terbinafine (LAMISIL) 250 MG tablet Take 1 tablet (250 mg total) daily by mouth.   Allergies  Allergen Reactions  . Losartan Shortness Of Breath  . Ibuprofen Other (See Comments)    Irritates stomach   Past Medical History:  Diagnosis Date  . Back pain   . Cardiomyopathy ([place])    a. 04/08/2016 Echo: EF 35%, diff HK, mild LVH, Gr2 DD, mod AI, mod to sev MR, mildly dil LA.  [name] ETOH abuse   . Hypertensive heart disease with heart failure ([place])   . Marijuana abuse   . Moderate aortic insufficiency    a. 04/08/2016 Echo: mod AI.  [name] Moderate to Severe Mitral Regurgitation    a. 04/08/2016 Echo: mod-sev MR directed centrally.  . Tobacco abuse    Family History  Problem Relation Age of Onset  . CAD Mother        First MI @ 26 - 65 total.  . Cirrhosis Father  alcoholic - died in his 97'W.  . Lupus Sister    Past Surgical History:  Procedure Laterality Date  . EXPLORATORY LAPAROTOMY  2003   Ectopic Pregnancy - unsure which side or if tube was removed  . Right/Left Heart Cath and Coronary Angiography N/A 04/10/2016   Performed by [name], MD at [facility]  . TUBAL LIGATION     Social History   Socioeconomic History  . Marital status: Single    Spouse name: Not on file  . Number of children: Not on file  . Years of education: Not on file  . Highest education level: Not on file  Social Needs  . Financial resource strain: Not on file  . Food insecurity - worry: Not on file  . Food insecurity - inability: Not on file  .  Transportation needs - medical: Not on file  . Transportation needs - non-medical: Not on file  Occupational History  . Occupation: Cook  Tobacco Use  . Smoking status: Current Every Day Smoker    Packs/day: 0.50    Years: 15.00    Pack years: 7.50    Types: Cigarettes  . Smokeless tobacco: Never Used  Substance and Sexual Activity  . Alcohol use: Yes    Comment: At least 12 ounces of homemade moon shine every other day.  . Drug use: Yes    Types: Marijuana    Comment: Smokes marijuana daily.  Used to smoke cocaine laced  marijuana cigarettes for 15-20 yrs but quit cocaine 6 yrs ago.  [name] Sexual activity: Yes    Birth control/protection: Other-see comments    Comment: BTL  Other Topics Concern  . Not on file  Social History Narrative   Lives in [place] with her fiance.  Does not routinely exercise.     Review of Systems: General: negative for chills, fever, night sweats or weight changes.  Cardiovascular: negative for chest pain, dyspnea on exertion, edema, orthopnea, palpitations, paroxysmal nocturnal dyspnea or shortness of breath Dermatological: negative for rash Respiratory: negative for cough or wheezing Urologic: negative for hematuria Abdominal: negative for nausea, vomiting, diarrhea, bright red blood per rectum, melena, or hematemesis Neurologic: negative for visual changes, syncope, or dizziness All other systems reviewed and are otherwise negative except as noted above.   Physical Exam:  Blood pressure 114/78, pulse 79, height 5' 5.5" (1.664 m), weight 151 lb 1.9 oz (68.5 kg), last menstrual period 05/11/2017, SpO2 99 %.  General appearance: alert, cooperative and no distress Neck: no carotid bruit and no JVD Lungs: clear to auscultation bilaterally Heart: regular rate and rhythm, S1, S2 normal, no murmur, click, rub or gallop Extremities: extremities normal, atraumatic, no cyanosis or edema Pulses: 2+ and symmetric Skin: Skin color, texture, turgor normal. No rashes  or lesions Neurologic: Grossly normal  EKG not performed -- personally reviewed   ASSESSMENT AND PLAN:   1. Chronic Combined Systolic and Diastolic HF: EF 08/6376 was 35%. LHC with normal cors. Felt 2/2 hypertensive heart disease. BP better controlled with medical management. 114/78 today. No s/s of acute exacerbation. Denies dyspnea. Volume stable, with clear lung exam and no peripheral edema. Repeat 2D echo to see if any improvement in LVF. BMP today for medication monitoring given daily lasix and supplemental K.   2. HTN: well controlled today. 114/78 before any meds. Pt only taking Coreg once daily. Will increase to BID dosing given systolic HF. Decrease amlodipine to 5 mg. Pt will monitor BP at home. She has  BP cuff. Goal BP <140/90.   3. Valvular Disease: moderate AI and moderate to severe MR noted on echo 03/2016. In the setting of hypertensive heart disease. Pt asymptomatic. BP better controlled. Repeat 2D echo.   4. Tobacco Use: pt plans to quit this week. She states she is down to her last pack and does not plan on buying any more cigarettes. She plans to buy nicotine gum to help with smoking cessation. Pt encouraged to stick with plan.    Follow-Up w/ Dr. Acie Fredrickson in 6 months.   Brice Potteiger Ladoris Gene, MHS [facility] 06/02/2017 8:15 AM

## 2017-06-09 ENCOUNTER — Telehealth (INDEPENDENT_AMBULATORY_CARE_PROVIDER_SITE_OTHER): Payer: Self-pay | Admitting: Physician Assistant

## 2017-06-09 ENCOUNTER — Ambulatory Visit (HOSPITAL_COMMUNITY): Payer: Self-pay | Attending: Cardiology

## 2017-06-09 ENCOUNTER — Other Ambulatory Visit: Payer: Self-pay

## 2017-06-09 DIAGNOSIS — I080 Rheumatic disorders of both mitral and aortic valves: Secondary | ICD-10-CM | POA: Insufficient documentation

## 2017-06-09 DIAGNOSIS — I110 Hypertensive heart disease with heart failure: Secondary | ICD-10-CM | POA: Insufficient documentation

## 2017-06-09 DIAGNOSIS — I509 Heart failure, unspecified: Secondary | ICD-10-CM | POA: Insufficient documentation

## 2017-06-09 DIAGNOSIS — I351 Nonrheumatic aortic (valve) insufficiency: Secondary | ICD-10-CM

## 2017-06-09 NOTE — Telephone Encounter (Signed)
Patient stated that PA Altamease Oiler give her a rx for ringworms and is no helping a lot is getting worse . She wants to know what else she can do

## 2017-06-09 NOTE — Telephone Encounter (Signed)
Patient has been scheduled for an appt on 11/29. Nat Christen, CMA

## 2017-06-11 ENCOUNTER — Ambulatory Visit (INDEPENDENT_AMBULATORY_CARE_PROVIDER_SITE_OTHER): Payer: Self-pay | Admitting: Physician Assistant

## 2017-06-11 ENCOUNTER — Other Ambulatory Visit: Payer: Self-pay

## 2017-06-11 ENCOUNTER — Encounter (INDEPENDENT_AMBULATORY_CARE_PROVIDER_SITE_OTHER): Payer: Self-pay | Admitting: Physician Assistant

## 2017-06-11 VITALS — BP 160/96 | HR 81 | Temp 98.0°F | Wt 153.4 lb

## 2017-06-11 DIAGNOSIS — I10 Essential (primary) hypertension: Secondary | ICD-10-CM

## 2017-06-11 DIAGNOSIS — L989 Disorder of the skin and subcutaneous tissue, unspecified: Secondary | ICD-10-CM

## 2017-06-11 MED ORDER — HYDROXYZINE HCL 25 MG PO TABS: 25.0000 mg | ORAL_TABLET | Freq: Two times a day (BID) | ORAL | 0 refills | Status: DC

## 2017-06-11 MED ORDER — TRIAMCINOLONE ACETONIDE 0.5 % EX CREA: 1.0000 "application " | TOPICAL_CREAM | Freq: Two times a day (BID) | CUTANEOUS | 0 refills | Status: DC

## 2017-06-11 MED FILL — TRIAMCINOLONE 0.5% CREAM: 0.5 | 7 days supply | Qty: 30 | Fill #0

## 2017-06-11 MED FILL — hydrOXYzine HCL 25 MG TABS: 25 | 5 days supply | Qty: 10 | Fill #0

## 2017-06-11 NOTE — Progress Notes (Signed)
Subjective:  Patient ID: Rachael Stone, female    DOB: 1966-07-02  Age: 51 y.o. MRN: [id number]  CC: Tinea   HPI Rachael A Johnsonis a 51 y.o.femalewith a PMH of HTN, cardiomyopathy, aortic insufficiency, mitral regurgitation, ETOH abuse, tobacco abuse, and marijuana abuse presents on f/u of Tinea corporis. Says she took Terbinafine 250 mg qday x14 days as directed. Terbinafine failed and says she has more lesions now. The lesions are located on the abdomen and are pruritic. Sister has lupus. No constitutional symptoms.     BP noted to be elevated today. Says she is not taking antihypertensives. Went to cardiologist recently and was found to have BP 114/78. Said she had not been taking anti-hypertensives at the time and thought she did not have to continue taking anti-hypertensives.      Outpatient Medications Prior to Visit  Medication Sig Dispense Refill  . amLODipine (NORVASC) 5 MG tablet Take 1 tablet (5 mg total) daily by mouth. 90 tablet 3  . Aspirin-Caffeine (BACK & BODY EXTRA STRENGTH PO) Take 1 tablet by mouth every 6 (six) hours as needed (pain).    . carvedilol (COREG) 6.25 MG tablet Take 1 tablet (6.25 mg total) 2 (two) times daily by mouth. 180 tablet 3  . furosemide (LASIX) 40 MG tablet Take 1 tablet (40 mg total) by mouth daily. 30 tablet 5  . omeprazole (PRILOSEC) 40 MG capsule Take 1 capsule (40 mg total) daily by mouth. 30 capsule 3  . Potassium Chloride ER 20 MEQ TBCR Take 10 mEq daily by mouth.  0  . terbinafine (LAMISIL) 250 MG tablet Take 1 tablet (250 mg total) daily by mouth. (Patient not taking: Reported on 06/11/2017) 14 tablet 0   No facility-administered medications prior to visit.      ROS Review of Systems  Constitutional: Negative for chills, fever and malaise/fatigue.  Eyes: Negative for blurred vision.  Respiratory: Negative for shortness of breath.   Cardiovascular: Negative for chest pain and palpitations.  Gastrointestinal: Negative for  abdominal pain and nausea.  Genitourinary: Negative for dysuria and hematuria.  Musculoskeletal: Negative for joint pain and myalgias.  Skin: Positive for itching and rash.  Neurological: Negative for tingling and headaches.  Psychiatric/Behavioral: Negative for depression. The patient is not nervous/anxious.     Objective:  BP (!) 160/96 (BP Location: Left Arm, Patient Position: Sitting, Cuff Size: Normal)   Pulse 81   Temp 98 F (36.7 C) (Oral)   Wt 153 lb 6.4 oz (69.6 kg)   SpO2 98%   BMI 25.14 kg/m   BP/Weight 06/11/2017 06/02/2017 74/08/5954  Systolic BP 387 564 332  Diastolic BP 96 78 86  Wt. (Lbs) 153.4 151.12 155  BMI 25.14 24.77 25.79      Physical Exam  Constitutional: She is oriented to person, place, and time.  Well developed, centrally obese, NAD, polite  HENT:  Head: Normocephalic and atraumatic.  Eyes: No scleral icterus.  Cardiovascular: Normal rate, regular rhythm and normal heart sounds.  Pulmonary/Chest: Effort normal and breath sounds normal. No respiratory distress. She has no wheezes. She has no rales.  Abdominal: Soft. Bowel sounds are normal. There is no tenderness.  Musculoskeletal: She exhibits no edema.  Neurological: She is alert and oriented to person, place, and time. No cranial nerve deficit. Coordination normal.  Skin: Skin is warm and dry.  Multiple, isolated, erythematous, scaled, and crusted lesions on abdomen. No cellulitis, induration, suppuration, or bleeding.   Psychiatric: She has a normal mood and  affect. Her behavior is normal. Thought content normal.  Vitals reviewed.    Assessment & Plan:   1. Skin lesion - Failed Terbinafine 250 mg. Will need to consider subacute cutaneous lupus erythematosus as it was revealed today patient's sister has lupus.  - Ambulatory referral to Dermatology - Begin hydrOXYzine (ATARAX/VISTARIL) 25 MG tablet; Take 1 tablet (25 mg total) by mouth 2 (two) times daily.  Dispense: 10 tablet; Refill: 0 -  Begin triamcinolone cream (KENALOG) 0.5 %; Apply 1 application topically 2 (two) times daily for 7 days. Do not use for more than 7 consecutive days without medical approval.  Dispense: 30 g; Refill: 0 - ANA w/Reflex  2. HTN - Advised patient to take anti-hypertensives as directed everyday.    Meds ordered this encounter  Medications  . hydrOXYzine (ATARAX/VISTARIL) 25 MG tablet    Sig: Take 1 tablet (25 mg total) by mouth 2 (two) times daily.    Dispense:  10 tablet    Refill:  0    Order Specific Question:   Supervising Provider    Answer:   Tresa Garter [id number]  . triamcinolone cream (KENALOG) 0.5 %    Sig: Apply 1 application topically 2 (two) times daily for 7 days. Do not use for more than 7 consecutive days without medical approval.    Dispense:  30 g    Refill:  0    Order Specific Question:   Supervising Provider    Answer:   Tresa Garter [[id number]]    Follow-up: Return in about 4 weeks (around 07/09/2017) for HTN and skin lesions.Clent Demark PA

## 2017-06-11 NOTE — Patient Instructions (Signed)
You may take Lasix and Carvedilol in the morning. Take Amlodipine and the second dose of Carvedilol in the evening.

## 2017-06-12 LAB — ANA W/REFLEX: Anti Nuclear Antibody(ANA): NEGATIVE

## 2017-06-15 ENCOUNTER — Telehealth (INDEPENDENT_AMBULATORY_CARE_PROVIDER_SITE_OTHER): Payer: Self-pay

## 2017-06-15 NOTE — Telephone Encounter (Signed)
Patient aware of negative for autoimmune disease. Nat Christen, CMA

## 2017-06-15 NOTE — Telephone Encounter (Signed)
-----   Message from Clent Demark, PA-C sent at 06/15/2017  1:51 PM EST ----- Negative for autoimmune disease.

## 2017-06-16 ENCOUNTER — Telehealth: Payer: Self-pay | Admitting: Cardiovascular Disease

## 2017-06-16 NOTE — Telephone Encounter (Signed)
Noted  

## 2017-06-16 NOTE — Telephone Encounter (Signed)
Follow up    Patient called back she can not come in 12/5 she has to work, she rescheduled for 12/6 3p and she is going to bring her man with her, her sister can only come in on Fridays.

## 2017-06-16 NOTE — Telephone Encounter (Signed)
Patient calling, states that

## 2017-06-16 NOTE — Telephone Encounter (Signed)
1047am pt states she is returning your call, I don't see where that call was placed however [phone]

## 2017-06-16 NOTE — Telephone Encounter (Signed)
Called patient and she states she cannot talk right now and asked to call me back

## 2017-06-16 NOTE — Telephone Encounter (Signed)
Patient calling, would like to speak with you. Patient would not go into detail.

## 2017-06-17 ENCOUNTER — Ambulatory Visit: Payer: Medicaid Other | Admitting: Cardiovascular Disease

## 2017-06-18 ENCOUNTER — Encounter (INDEPENDENT_AMBULATORY_CARE_PROVIDER_SITE_OTHER): Payer: Self-pay

## 2017-06-18 ENCOUNTER — Ambulatory Visit (INDEPENDENT_AMBULATORY_CARE_PROVIDER_SITE_OTHER): Payer: Self-pay | Admitting: Cardiovascular Disease

## 2017-06-18 ENCOUNTER — Encounter: Payer: Self-pay | Admitting: Cardiovascular Disease

## 2017-06-18 VITALS — BP 140/68 | HR 87 | Ht 65.0 in | Wt 150.0 lb

## 2017-06-18 DIAGNOSIS — I5022 Chronic systolic (congestive) heart failure: Secondary | ICD-10-CM

## 2017-06-18 DIAGNOSIS — I351 Nonrheumatic aortic (valve) insufficiency: Secondary | ICD-10-CM

## 2017-06-18 NOTE — Patient Instructions (Addendum)
Medication Instructions:  Your physician recommends that you continue on your current medications as directed. Please refer to the Current Medication list given to you today.   Labwork: TODAY - CBC, BMET   Testing/Procedures: You are scheduled for a TEE on Friday December 14 with Dr. Acie Fredrickson.  Please arrive at the [facility] (Main Entrance A) at [facility]: [address] [place], [place] [address] at 8 am. (1 hour prior to procedure unless lab work is needed)  DIET: Nothing to eat or drink after midnight except a sip of water with medications (see medication instructions below)  Medication Instructions: Continue all medications   (You must have a responsible person to drive you home and stay in the waiting area during your procedure. Failure to do so could result in cancellation. Bring your insurance cards. *Special Note: Every effort is made to have your procedure done on time. Occasionally there are emergencies that occur at the hospital that may cause delays. Please be patient if a delay does occur.    Follow-Up: Your physician recommends that you schedule a follow-up appointment in: 2 months with Dr. Acie Fredrickson   If you need a refill on your cardiac medications before your next appointment, please call your pharmacy.   Thank you for choosing [facility]! Christen Bame, RN [phone]

## 2017-06-18 NOTE — Progress Notes (Signed)
Cardiology Office Note   Date:  06/18/2017   ID:  ASUSENA SIGLEY, DOB 21-Aug-1965, MRN [id number]  PCP:  Clent Demark, PA-C  Cardiologist:  Dr. Acie Fredrickson    Chief Complaint  Patient presents with  . Congestive Heart Failure      Notes from [name]: TASHUNDA VANDEZANDE is a 51 y.o. female who presents for post hospital for progressive dyspnea and orthopnea and has been found to have LV dysfunction (EF 35%), mod AI, and mod - sev MR.  She has a  h/o tob, alcohol, marijuana, and remote cocaine abuse.  She has no prior cardiac hx.  She had CTA of chest without PE + pulmonary edema and LUL nodule of 26mm.  Echo with EF 30% and mod-sev MR and mod AI. [name]    She had R & L cardiac cath   LV end diastolic pressure is normal.  There is moderate left ventricular systolic dysfunction.  The left ventricular ejection fraction is 35% by visual estimate.  There is no aortic valve stenosis.  LV end diastolic pressure is normal  Normal coronary arteries.  She has done well no further SOB, swelling, which was in her abd or chest pain.  She has decreased cigarettes to 5 per day down from 10 per day.  Could not afford nicotene patches.  She has cut out salt in her diet.  Unfortunately she has not taken her meds for 2 days, she is worried they will hurt her heart.  We had a long discussion on what happened to her heart and the weakness of her ventricle. We discussed how important the meds were to improve her heart.     Feb.   2, 2018: Seen with her sister, [name].   She stopped her meds when they ran out in Dec.   Says she felt better off the meds.   She says that she does not have any symptoms.   Does not want to restart meds.   Saw [name] who refilled her meds.  Patient did not Get her medications filled. She states that she does not understand why she needs to take these medications and does not want to take medications or her life.  Nov 14, 2016:  Sharrie is seen today with  her sister, [name].  No CP or dyspnea.    Donates plasma twice a week.   BP is typically normal there  Dec. 6,2018:  Sorah had an echocardiogram recently that revealed worsening systolic congestive heart failure and moderate to severe aortic insufficiency. Seen with her sister , [name]  No real increase in dyspnea.    Past Medical History:  Diagnosis Date  . Back pain   . Cardiomyopathy ([place])    a. 04/08/2016 Echo: EF 35%, diff HK, mild LVH, Gr2 DD, mod AI, mod to sev MR, mildly dil LA.  [name] ETOH abuse   . Hypertensive heart disease with heart failure ([place])   . Marijuana abuse   . Moderate aortic insufficiency    a. 04/08/2016 Echo: mod AI.  [name] Moderate to Severe Mitral Regurgitation    a. 04/08/2016 Echo: mod-sev MR directed centrally.  . Tobacco abuse     Past Surgical History:  Procedure Laterality Date  . CARDIAC CATHETERIZATION N/A 04/10/2016   Procedure: Right/Left Heart Cath and Coronary Angiography;  Surgeon: [name], MD;  Location: [facility];  Service: Cardiovascular;  Laterality: N/A;  . EXPLORATORY LAPAROTOMY  2003   Ectopic Pregnancy -  unsure which side or if tube was removed  . TUBAL LIGATION       Current Outpatient Medications  Medication Sig Dispense Refill  . amLODipine (NORVASC) 10 MG tablet Take 10 mg by mouth daily.  1  . Aspirin-Caffeine (BACK & BODY EXTRA STRENGTH PO) Take 1 tablet by mouth every 6 (six) hours as needed (pain).    . carvedilol (COREG) 6.25 MG tablet Take 1 tablet (6.25 mg total) 2 (two) times daily by mouth. 180 tablet 3  . furosemide (LASIX) 40 MG tablet Take 1 tablet (40 mg total) by mouth daily. 30 tablet 5  . Potassium Chloride ER 20 MEQ TBCR Take 10 mEq daily by mouth.  0   No current facility-administered medications for this visit.     Allergies:   Losartan and Ibuprofen    Social History:  The patient  reports that she has been smoking cigarettes.  She has a 7.50 pack-year smoking history. she has never  used smokeless tobacco. She reports that she drinks alcohol. She reports that she uses drugs. Drug: Marijuana.   Family History:  The patient's family history includes CAD in her mother; Cirrhosis in her father; Lupus in her sister.    ROS:  General:no colds or fevers, no weight changes Skin:no rashes or ulcers HEENT:no blurred vision, no congestion CV:see HPI PUL:see HPI GI:no diarrhea constipation or melena, no indigestion GU:no hematuria, no dysuria MS:no joint pain, no claudication Neuro:no syncope, no lightheadedness Endo:no diabetes, no thyroid disease  Wt Readings from Last 3 Encounters:  06/18/17 150 lb (68 kg)  06/11/17 153 lb 6.4 oz (69.6 kg)  06/02/17 151 lb 1.9 oz (68.5 kg)     PHYSICAL EXAM: VS:  BP 140/68   Pulse 87   Ht 5\' 5"  (1.651 m)   Wt 150 lb (68 kg)   SpO2 98%   BMI 24.96 kg/m  , BMI Body mass index is 24.96 kg/m. General:Pleasant affect, NAD Skin:Warm and dry, brisk capillary refill HEENT:normocephalic, sclera clear, mucus membranes moist Neck:supple, no JVD, no bruits  Heart: 2/6 diastolic murmur , crisk pulses  Lungs:clear without rales, rhonchi, or wheezes KYH:CWCB, non tender, + BS, do not palpate liver spleen or masses Ext:no lower ext edema, 2+ pedal pulses, 2+ radial pulses Neuro:alert and oriented X 3, MAE, follows commands, + facial symmetry    EKG:  EKG is  ordered today. Nov 14, 2016:   NSR at 80.  NS T wave abn.  No changes from previous tracing   Recent Labs: 01/09/2017: ALT 24 01/22/2017: TSH 0.717 03/27/2017: Magnesium 1.7 03/29/2017: B Natriuretic Peptide 134.8; Hemoglobin 16.9; Platelets 218 06/02/2017: BUN 8; Creatinine, Ser 0.92; Potassium 3.6; Sodium 141    Lipid Panel    Component Value Date/Time   CHOL 177 11/12/2007 2044   TRIG 159 (H) 11/12/2007 2044   HDL 44 11/12/2007 2044   CHOLHDL 4.0 Ratio 11/12/2007 2044   VLDL 32 11/12/2007 2044   LDLCALC 101 (H) 11/12/2007 2044       Other studies  Reviewed: Additional studies/ records that were reviewed today include: .  Cardiac Cath 04/10/16 Conclusion     LV end diastolic pressure is normal.  There is moderate left ventricular systolic dysfunction.  The left ventricular ejection fraction is 35% by visual estimate.  There is no aortic valve stenosis.  LV end diastolic pressure is normal.   Nonischemic cardiomyopathy.  Continue medical therapy.   Indications   Acute combined systolic and diastolic heart failure ([place]) [I50.41 (  ICD-10-CM)]  Dyspnea [R06.00 (ICD-10-CM)]   Hemo Data   Flowsheet Row Most Recent Value  Fick Cardiac Output 4.36 L/min  Fick Cardiac Output Index 2.46 (L/min)/BSA  RA A Wave 9 mmHg  RA V Wave 6 mmHg  RA Mean 4 mmHg  RV Systolic Pressure 28 mmHg  RV Diastolic Pressure 3 mmHg  RV EDP 6 mmHg  PA Systolic Pressure 28 mmHg  PA Diastolic Pressure 13 mmHg  PA Mean 20 mmHg  PW A Wave 10 mmHg  PW V Wave 9 mmHg  PW Mean 8 mmHg  AO Systolic Pressure 381 mmHg  AO Diastolic Pressure 77 mmHg  AO Mean 99 mmHg  LV Systolic Pressure 829 mmHg  LV Diastolic Pressure 7 mmHg  LV EDP 12 mmHg  Arterial Occlusion Pressure Extended Systolic Pressure 937 mmHg  Arterial Occlusion Pressure Extended Diastolic Pressure 76 mmHg  Arterial Occlusion Pressure Extended Mean Pressure 98 mmHg  Left Ventricular Apex Extended Systolic Pressure 169 mmHg  Left Ventricular Apex Extended Diastolic Pressure 8 mmHg  Left Ventricular Apex Extended EDP Pressure 9 mmHg  QP/QS 1  TPVR Index 8.13 HRUI  TSVR Index 40.21 HRUI  PVR SVR Ratio 0.13  TPVR/TSVR Ratio    ECHO: 04/08/16 Study Conclusions  - Left ventricle: The cavity size was mildly dilated. Wall   thickness was increased in a pattern of mild LVH. The estimated   ejection fraction was 35%. Diffuse hypokinesis. Features are   consistent with a pseudonormal left ventricular filling pattern,   with concomitant abnormal relaxation and increased filling   pressure  (grade 2 diastolic dysfunction). - Aortic valve: Trileaflet; mildly calcified leaflets. There was no   stenosis. There was moderate regurgitation. There was no   holodiastolic flow reversal noted in the descending thoracic   aorta. - Mitral valve: There was moderate to severe regurgitation directed   centrally. - Left atrium: The atrium was mildly dilated. - Right ventricle: The cavity size was normal. Systolic function   was normal. - Pulmonary arteries: No complete TR doppler jet so unable to   estimate PA systolic pressure. - Systemic veins: IVC measured 1.7 cm with < 50% respirophasic   variation, suggesting RA pressure 8 mmHg.  Impressions:  - Mildly dilated LV with mild LV hypertrophy. EF 35%, diffuse   hypokinesis. Moderate diastolic dysfunction. Moderate aortic   insufficiency with trileaflet aortic valve. Moderate to severe   mitral regurgitation. MR was central, no definite leaflet   abnormalities noted.    ASSESSMENT AND PLAN:  1.  Acute combined systolic and diastolic heart failure ([place])-    [place] ejection fraction is around 35%. She has moderate to severe aortic insufficiency.      2.  Hypertensive heart disease with heart failure (HCC)  Stable  3.   NICM (nonischemic cardiomyopathy) ([place])     4. aortic insufficiency-her aortic insufficiency has worsened since her last echocardiogram.  She now has evidence of a reversal of diastolic flow in the aorta.  We will schedule her for a transesophageal echo for further evaluation.  5.   Moderate to Severe Mitral Regurgitation- it has been very difficult to keep her on her medications.  She seems to be doing well at present - thanks to the help of her sister, [name].  She has no signs of symptoms of CHF.   Advised her to watch her salt ( she clearly likes salt )   6.   Tobacco abuse.:  Advised her to stop smoking.  7.  Normal coronary arteries    Mertie Moores, MD  06/18/2017 6:09 PM    [facility] [place],  [address], [place]  [address] Pager [phone] Phone: [phone]; Fax: [phone]

## 2017-06-18 NOTE — H&P (View-Only) (Signed)
Cardiology Office Note   Date:  06/18/2017   ID:  Rachael Stone, DOB 1966-06-28, MRN [id number]  PCP:  Clent Demark, PA-C  Cardiologist:  Dr. Acie Fredrickson    Chief Complaint  Patient presents with  . Congestive Heart Failure      Notes from Rachael Stone: Rachael Stone is a 51 y.o. female who presents for post hospital for progressive dyspnea and orthopnea and has been found to have LV dysfunction (EF 35%), mod AI, and mod - sev MR.  She has a  h/o tob, alcohol, marijuana, and remote cocaine abuse.  She has no prior cardiac hx.  She had CTA of chest without PE + pulmonary edema and LUL nodule of 50mm.  Echo with EF 30% and mod-sev MR and mod AI. [name]    She had R & L cardiac cath   LV end diastolic pressure is normal.  There is moderate left ventricular systolic dysfunction.  The left ventricular ejection fraction is 35% by visual estimate.  There is no aortic valve stenosis.  LV end diastolic pressure is normal  Normal coronary arteries.  She has done well no further SOB, swelling, which was in her abd or chest pain.  She has decreased cigarettes to 5 per day down from 10 per day.  Could not afford nicotene patches.  She has cut out salt in her diet.  Unfortunately she has not taken her meds for 2 days, she is worried they will hurt her heart.  We had a long discussion on what happened to her heart and the weakness of her ventricle. We discussed how important the meds were to improve her heart.     Feb.   2, 2018: Seen with her sister, Rachael Stone.   She stopped her meds when they ran out in Dec.   Says she felt better off the meds.   She says that she does not have any symptoms.   Does not want to restart meds.   Saw Rachael Stone who refilled her meds.  Patient did not Get her medications filled. She states that she does not understand why she needs to take these medications and does not want to take medications or her life.  Nov 14, 2016:  Rachael Stone is seen today with  her sister, Rachael Stone.  No CP or dyspnea.    Donates plasma twice a week.   BP is typically normal there  Dec. 6,2018:  Rachael Stone had an echocardiogram recently that revealed worsening systolic congestive heart failure and moderate to severe aortic insufficiency. Seen with her sister , Rachael Stone  No real increase in dyspnea.    Past Medical History:  Diagnosis Date  . Back pain   . Cardiomyopathy ([place])    a. 04/08/2016 Echo: EF 35%, diff HK, mild LVH, Gr2 DD, mod AI, mod to sev MR, mildly dil LA.  [name] ETOH abuse   . Hypertensive heart disease with heart failure ([place])   . Marijuana abuse   . Moderate aortic insufficiency    a. 04/08/2016 Echo: mod AI.  [name] Moderate to Severe Mitral Regurgitation    a. 04/08/2016 Echo: mod-sev MR directed centrally.  . Tobacco abuse     Past Surgical History:  Procedure Laterality Date  . CARDIAC CATHETERIZATION N/A 04/10/2016   Procedure: Right/Left Heart Cath and Coronary Angiography;  Surgeon: [name], MD;  Location: [facility];  Service: Cardiovascular;  Laterality: N/A;  . EXPLORATORY LAPAROTOMY  2003   Ectopic Pregnancy -  unsure which side or if tube was removed  . TUBAL LIGATION       Current Outpatient Medications  Medication Sig Dispense Refill  . amLODipine (NORVASC) 10 MG tablet Take 10 mg by mouth daily.  1  . Aspirin-Caffeine (BACK & BODY EXTRA STRENGTH PO) Take 1 tablet by mouth every 6 (six) hours as needed (pain).    . carvedilol (COREG) 6.25 MG tablet Take 1 tablet (6.25 mg total) 2 (two) times daily by mouth. 180 tablet 3  . furosemide (LASIX) 40 MG tablet Take 1 tablet (40 mg total) by mouth daily. 30 tablet 5  . Potassium Chloride ER 20 MEQ TBCR Take 10 mEq daily by mouth.  0   No current facility-administered medications for this visit.     Allergies:   Losartan and Ibuprofen    Social History:  The patient  reports that she has been smoking cigarettes.  She has a 7.50 pack-year smoking history. she has never  used smokeless tobacco. She reports that she drinks alcohol. She reports that she uses drugs. Drug: Marijuana.   Family History:  The patient's family history includes CAD in her mother; Cirrhosis in her father; Lupus in her sister.    ROS:  General:no colds or fevers, no weight changes Skin:no rashes or ulcers HEENT:no blurred vision, no congestion CV:see HPI PUL:see HPI GI:no diarrhea constipation or melena, no indigestion GU:no hematuria, no dysuria MS:no joint pain, no claudication Neuro:no syncope, no lightheadedness Endo:no diabetes, no thyroid disease  Wt Readings from Last 3 Encounters:  06/18/17 150 lb (68 kg)  06/11/17 153 lb 6.4 oz (69.6 kg)  06/02/17 151 lb 1.9 oz (68.5 kg)     PHYSICAL EXAM: VS:  BP 140/68   Pulse 87   Ht 5\' 5"  (1.651 m)   Wt 150 lb (68 kg)   SpO2 98%   BMI 24.96 kg/m  , BMI Body mass index is 24.96 kg/m. General:Pleasant affect, NAD Skin:Warm and dry, brisk capillary refill HEENT:normocephalic, sclera clear, mucus membranes moist Neck:supple, no JVD, no bruits  Heart: 2/6 diastolic murmur , crisk pulses  Lungs:clear without rales, rhonchi, or wheezes MOQ:HUTM, non tender, + BS, do not palpate liver spleen or masses Ext:no lower ext edema, 2+ pedal pulses, 2+ radial pulses Neuro:alert and oriented X 3, MAE, follows commands, + facial symmetry    EKG:  EKG is  ordered today. Nov 14, 2016:   NSR at 80.  NS T wave abn.  No changes from previous tracing   Recent Labs: 01/09/2017: ALT 24 01/22/2017: TSH 0.717 03/27/2017: Magnesium 1.7 03/29/2017: B Natriuretic Peptide 134.8; Hemoglobin 16.9; Platelets 218 06/02/2017: BUN 8; Creatinine, Ser 0.92; Potassium 3.6; Sodium 141    Lipid Panel    Component Value Date/Time   CHOL 177 11/12/2007 2044   TRIG 159 (H) 11/12/2007 2044   HDL 44 11/12/2007 2044   CHOLHDL 4.0 Ratio 11/12/2007 2044   VLDL 32 11/12/2007 2044   LDLCALC 101 (H) 11/12/2007 2044       Other studies  Reviewed: Additional studies/ records that were reviewed today include: .  Cardiac Cath 04/10/16 Conclusion     LV end diastolic pressure is normal.  There is moderate left ventricular systolic dysfunction.  The left ventricular ejection fraction is 35% by visual estimate.  There is no aortic valve stenosis.  LV end diastolic pressure is normal.   Nonischemic cardiomyopathy.  Continue medical therapy.   Indications   Acute combined systolic and diastolic heart failure ([place]) [I50.41 (  ICD-10-CM)]  Dyspnea [R06.00 (ICD-10-CM)]   Hemo Data   Flowsheet Row Most Recent Value  Fick Cardiac Output 4.36 L/min  Fick Cardiac Output Index 2.46 (L/min)/BSA  RA A Wave 9 mmHg  RA V Wave 6 mmHg  RA Mean 4 mmHg  RV Systolic Pressure 28 mmHg  RV Diastolic Pressure 3 mmHg  RV EDP 6 mmHg  PA Systolic Pressure 28 mmHg  PA Diastolic Pressure 13 mmHg  PA Mean 20 mmHg  PW A Wave 10 mmHg  PW V Wave 9 mmHg  PW Mean 8 mmHg  AO Systolic Pressure 154 mmHg  AO Diastolic Pressure 77 mmHg  AO Mean 99 mmHg  LV Systolic Pressure 008 mmHg  LV Diastolic Pressure 7 mmHg  LV EDP 12 mmHg  Arterial Occlusion Pressure Extended Systolic Pressure 676 mmHg  Arterial Occlusion Pressure Extended Diastolic Pressure 76 mmHg  Arterial Occlusion Pressure Extended Mean Pressure 98 mmHg  Left Ventricular Apex Extended Systolic Pressure 195 mmHg  Left Ventricular Apex Extended Diastolic Pressure 8 mmHg  Left Ventricular Apex Extended EDP Pressure 9 mmHg  QP/QS 1  TPVR Index 8.13 HRUI  TSVR Index 40.21 HRUI  PVR SVR Ratio 0.13  TPVR/TSVR Ratio    ECHO: 04/08/16 Study Conclusions  - Left ventricle: The cavity size was mildly dilated. Wall   thickness was increased in a pattern of mild LVH. The estimated   ejection fraction was 35%. Diffuse hypokinesis. Features are   consistent with a pseudonormal left ventricular filling pattern,   with concomitant abnormal relaxation and increased filling   pressure  (grade 2 diastolic dysfunction). - Aortic valve: Trileaflet; mildly calcified leaflets. There was no   stenosis. There was moderate regurgitation. There was no   holodiastolic flow reversal noted in the descending thoracic   aorta. - Mitral valve: There was moderate to severe regurgitation directed   centrally. - Left atrium: The atrium was mildly dilated. - Right ventricle: The cavity size was normal. Systolic function   was normal. - Pulmonary arteries: No complete TR doppler jet so unable to   estimate PA systolic pressure. - Systemic veins: IVC measured 1.7 cm with < 50% respirophasic   variation, suggesting RA pressure 8 mmHg.  Impressions:  - Mildly dilated LV with mild LV hypertrophy. EF 35%, diffuse   hypokinesis. Moderate diastolic dysfunction. Moderate aortic   insufficiency with trileaflet aortic valve. Moderate to severe   mitral regurgitation. MR was central, no definite leaflet   abnormalities noted.    ASSESSMENT AND PLAN:  1.  Acute combined systolic and diastolic heart failure ([place])-    [place] ejection fraction is around 35%. She has moderate to severe aortic insufficiency.      2.  Hypertensive heart disease with heart failure (HCC)  Stable  3.   NICM (nonischemic cardiomyopathy) ([place])     4. aortic insufficiency-her aortic insufficiency has worsened since her last echocardiogram.  She now has evidence of a reversal of diastolic flow in the aorta.  We will schedule her for a transesophageal echo for further evaluation.  5.   Moderate to Severe Mitral Regurgitation- it has been very difficult to keep her on her medications.  She seems to be doing well at present - thanks to the help of her sister, Rachael Stone.  She has no signs of symptoms of CHF.   Advised her to watch her salt ( she clearly likes salt )   6.   Tobacco abuse.:  Advised her to stop smoking.  7.  Normal coronary arteries    Mertie Moores, MD  06/18/2017 6:09 PM    [facility] [place],  [place], [place]  [address] Pager [phone] Phone: [phone]; Fax: [phone]

## 2017-06-19 ENCOUNTER — Other Ambulatory Visit: Payer: Self-pay | Admitting: Nurse Practitioner

## 2017-06-19 ENCOUNTER — Telehealth: Payer: Self-pay | Admitting: Cardiovascular Disease

## 2017-06-19 LAB — BASIC METABOLIC PANEL
BUN/Creatinine Ratio: 9 (ref 9–23)
BUN: 8 mg/dL (ref 6–24)
CO2: 23 mmol/L (ref 20–29)
Calcium: 8.6 mg/dL — ABNORMAL LOW (ref 8.7–10.2)
Chloride: 103 mmol/L (ref 96–106)
Creatinine, Ser: 0.85 mg/dL (ref 0.57–1.00)
GFR calc Af Amer: 93 mL/min/{1.73_m2} (ref >59)
GFR, EST NON AFRICAN AMERICAN: 81 mL/min/{1.73_m2} (ref >59)
Glucose: 97 mg/dL (ref 65–99)
POTASSIUM: 3.3 mmol/L — AB (ref 3.5–5.2)
SODIUM: 141 mmol/L (ref 134–144)

## 2017-06-19 LAB — CBC WITH DIFFERENTIAL/PLATELET
BASOS: 0 %
Basophils Absolute: 0 10*3/uL (ref 0.0–0.2)
EOS (ABSOLUTE): 0.5 10*3/uL — AB (ref 0.0–0.4)
Eos: 5 %
Hematocrit: 45.4 % (ref 34.0–46.6)
Hemoglobin: 14.9 g/dL (ref 11.1–15.9)
IMMATURE GRANULOCYTES: 0 %
Immature Grans (Abs): 0 10*3/uL (ref 0.0–0.1)
Lymphocytes Absolute: 2.4 10*3/uL (ref 0.7–3.1)
Lymphs: 24 %
MCH: 28.2 pg (ref 26.6–33.0)
MCHC: 32.8 g/dL (ref 31.5–35.7)
MCV: 86 fL (ref 79–97)
MONOS ABS: 1 10*3/uL — AB (ref 0.1–0.9)
Monocytes: 10 %
NEUTROS ABS: 6.2 10*3/uL (ref 1.4–7.0)
NEUTROS PCT: 61 %
PLATELETS: 251 10*3/uL (ref 150–379)
RBC: 5.28 x10E6/uL (ref 3.77–5.28)
RDW: 15.1 % (ref 12.3–15.4)
WBC: 10.1 10*3/uL (ref 3.4–10.8)

## 2017-06-19 MED ORDER — POTASSIUM CHLORIDE ER 20 MEQ PO TBCR: 20.0000 meq | EXTENDED_RELEASE_TABLET | Freq: Every day | ORAL | 3 refills | Status: DC

## 2017-06-19 MED FILL — POTASSIUM CL ER 20 MEQ TAB: 20 | 30 days supply | Qty: 30 | Fill #0

## 2017-06-19 NOTE — Telephone Encounter (Signed)
Patient calling, asking to speak with you. Call disconnected,  Tried to call patient back, no answer.

## 2017-06-19 NOTE — Telephone Encounter (Signed)
Left a message for patient to increase Kdur to 20 meq once daily and that I will call her back next week to schedule her lab appointment.

## 2017-06-25 ENCOUNTER — Other Ambulatory Visit: Payer: Self-pay | Admitting: Nurse Practitioner

## 2017-06-25 DIAGNOSIS — E876 Hypokalemia: Secondary | ICD-10-CM

## 2017-06-26 ENCOUNTER — Encounter (HOSPITAL_COMMUNITY): Payer: Self-pay | Admitting: *Deleted

## 2017-06-26 ENCOUNTER — Encounter (HOSPITAL_COMMUNITY)
Admission: RE | Disposition: A | Discharge status: Home / Self Care | Payer: Self-pay | Source: Ambulatory Visit | Attending: Cardiovascular Disease

## 2017-06-26 ENCOUNTER — Ambulatory Visit (HOSPITAL_COMMUNITY)
Admission: RE | Admit: 2017-06-26 | Discharge: 2017-06-26 | Disposition: A | Discharge status: Home / Self Care | Payer: Medicaid Other | Source: Ambulatory Visit | Attending: Cardiovascular Disease | Admitting: Cardiovascular Disease

## 2017-06-26 ENCOUNTER — Other Ambulatory Visit: Payer: Self-pay

## 2017-06-26 ENCOUNTER — Ambulatory Visit (HOSPITAL_COMMUNITY): Payer: Medicaid Other | Admitting: Anesthesiology

## 2017-06-26 ENCOUNTER — Ambulatory Visit (HOSPITAL_BASED_OUTPATIENT_CLINIC_OR_DEPARTMENT_OTHER)
Admission: RE | Admit: 2017-06-26 | Discharge: 2017-06-26 | Disposition: A | Discharge status: Home / Self Care | Payer: Self-pay | Source: Ambulatory Visit | Attending: Cardiovascular Disease | Admitting: Cardiovascular Disease

## 2017-06-26 DIAGNOSIS — F17210 Nicotine dependence, cigarettes, uncomplicated: Secondary | ICD-10-CM | POA: Insufficient documentation

## 2017-06-26 DIAGNOSIS — K219 Gastro-esophageal reflux disease without esophagitis: Secondary | ICD-10-CM | POA: Insufficient documentation

## 2017-06-26 DIAGNOSIS — I351 Nonrheumatic aortic (valve) insufficiency: Secondary | ICD-10-CM

## 2017-06-26 DIAGNOSIS — I700 Atherosclerosis of aorta: Secondary | ICD-10-CM | POA: Insufficient documentation

## 2017-06-26 DIAGNOSIS — F1290 Cannabis use, unspecified, uncomplicated: Secondary | ICD-10-CM | POA: Insufficient documentation

## 2017-06-26 DIAGNOSIS — F1410 Cocaine abuse, uncomplicated: Secondary | ICD-10-CM | POA: Insufficient documentation

## 2017-06-26 DIAGNOSIS — I429 Cardiomyopathy, unspecified: Secondary | ICD-10-CM | POA: Insufficient documentation

## 2017-06-26 DIAGNOSIS — I5022 Chronic systolic (congestive) heart failure: Secondary | ICD-10-CM

## 2017-06-26 DIAGNOSIS — Z888 Allergy status to other drugs, medicaments and biological substances status: Secondary | ICD-10-CM | POA: Insufficient documentation

## 2017-06-26 DIAGNOSIS — I110 Hypertensive heart disease with heart failure: Secondary | ICD-10-CM | POA: Insufficient documentation

## 2017-06-26 DIAGNOSIS — Z79899 Other long term (current) drug therapy: Secondary | ICD-10-CM | POA: Insufficient documentation

## 2017-06-26 DIAGNOSIS — I5041 Acute combined systolic (congestive) and diastolic (congestive) heart failure: Secondary | ICD-10-CM | POA: Insufficient documentation

## 2017-06-26 DIAGNOSIS — Z886 Allergy status to analgesic agent status: Secondary | ICD-10-CM | POA: Insufficient documentation

## 2017-06-26 DIAGNOSIS — I083 Combined rheumatic disorders of mitral, aortic and tricuspid valves: Secondary | ICD-10-CM | POA: Insufficient documentation

## 2017-06-26 DIAGNOSIS — F329 Major depressive disorder, single episode, unspecified: Secondary | ICD-10-CM | POA: Insufficient documentation

## 2017-06-26 HISTORY — PX: TEE WITHOUT CARDIOVERSION: SHX5443

## 2017-06-26 SURGERY — CANCELLED PROCEDURE

## 2017-06-26 SURGERY — ECHOCARDIOGRAM, TRANSESOPHAGEAL
Anesthesia: Monitor Anesthesia Care

## 2017-06-26 MED ORDER — PROPOFOL 500 MG/50ML IV EMUL
INTRAVENOUS | Status: DC | PRN
  Administered 2017-06-26: 100 ug/kg/min via INTRAVENOUS

## 2017-06-26 MED ORDER — BUTAMBEN-TETRACAINE-BENZOCAINE 2-2-14 % EX AERO
INHALATION_SPRAY | CUTANEOUS | Status: DC | PRN
  Administered 2017-06-26: 1 via TOPICAL

## 2017-06-26 MED ORDER — SODIUM CHLORIDE 0.9 % IV SOLN
INTRAVENOUS | Status: DC
  Administered 2017-06-26: 08:00:00 via INTRAVENOUS

## 2017-06-26 MED ORDER — LIDOCAINE 2% (20 MG/ML) 5 ML SYRINGE
INTRAMUSCULAR | Status: DC | PRN
  Administered 2017-06-26: 60 mg via INTRAVENOUS

## 2017-06-26 NOTE — Transfer of Care (Addendum)
Immediate Anesthesia Transfer of Care Note  Patient: Rachael Stone  Procedure(s) Performed: TRANSESOPHAGEAL ECHOCARDIOGRAM (TEE) (N/A )  Patient Location: PACU and Endoscopy Unit  Anesthesia Type:MAC  Level of Consciousness: sedated  Airway & Oxygen Therapy: Patient Spontanous Breathing and Patient connected to nasal cannula oxygen  Post-op Assessment: Report given to RN and Post -op Vital signs reviewed and stable  Post vital signs: Reviewed and stable  Last Vitals:  Vitals:   06/26/17 0931 06/26/17 0940  BP: 105/66 (!) 143/61  Pulse: 75 75  Resp: (!) 29 20  Temp: 36.4 C   SpO2: 98% 100%    Last Pain:  Vitals:   06/26/17 0931  TempSrc: Axillary         Complications: No apparent anesthesia complications

## 2017-06-26 NOTE — Anesthesia Postprocedure Evaluation (Signed)
Anesthesia Post Note  Patient: Janeece A Sforza  Procedure(s) Performed: TRANSESOPHAGEAL ECHOCARDIOGRAM (TEE) (N/A )     Patient location during evaluation: Endoscopy Anesthesia Type: MAC Level of consciousness: awake and alert Pain management: pain level controlled Vital Signs Assessment: post-procedure vital signs reviewed and stable Respiratory status: spontaneous breathing, nonlabored ventilation, respiratory function stable and patient connected to nasal cannula oxygen Cardiovascular status: stable and blood pressure returned to baseline Postop Assessment: no apparent nausea or vomiting Anesthetic complications: no    Last Vitals:  Vitals:   06/26/17 0931 06/26/17 0940  BP: 105/66 (!) 143/61  Pulse: 75 75  Resp: (!) 29 20  Temp: 36.4 C   SpO2: 98% 100%    Last Pain:  Vitals:   06/26/17 0931  TempSrc: Axillary                 Daylah Sayavong

## 2017-06-26 NOTE — Telephone Encounter (Signed)
Spoke with patient who states she did not get my message last week to increase Kdur to 20 meq daily. I reviewed this advice with her and scheduled her for repeat lab appointment on 07/16/17. Patient is aware of TEE scheduled for tomorrow 12/14. She thanked me for the call.

## 2017-06-26 NOTE — Anesthesia Procedure Notes (Signed)
Procedure Name: MAC Date/Time: 06/26/2017 9:13 AM Performed by: Lieutenant Diego, CRNA Pre-anesthesia Checklist: Patient identified, Emergency Drugs available, Suction available, Patient being monitored and Timeout performed Patient Re-evaluated:Patient Re-evaluated prior to induction Oxygen Delivery Method: Nasal cannula Induction Type: IV induction

## 2017-06-26 NOTE — Progress Notes (Signed)
  Echocardiogram Echocardiogram Transesophageal has been performed.  Fowler Antos G Sharde Gover 06/26/2017, 10:03 AM

## 2017-06-26 NOTE — Anesthesia Preprocedure Evaluation (Addendum)
Anesthesia Evaluation  Patient identified by MRN, date of birth, ID band  Reviewed: Allergy & Precautions, NPO status , Patient's Chart, lab work & pertinent test results  History of Anesthesia Complications Negative for: history of anesthetic complications  Airway Mallampati: I  TM Distance: >3 FB Neck ROM: Full    Dental  (+) Upper Dentures, Missing   Pulmonary shortness of breath, Current Smoker,    breath sounds clear to auscultation       Cardiovascular hypertension, +CHF  + Valvular Problems/Murmurs AI  Rhythm:Regular + Diastolic murmurs    Neuro/Psych PSYCHIATRIC DISORDERS Depression    GI/Hepatic GERD  Controlled,(+)     substance abuse  alcohol use and marijuana use,   Endo/Other  negative endocrine ROS  Renal/GU negative Renal ROS     Musculoskeletal   Abdominal   Peds  Hematology negative hematology ROS (+)   Anesthesia Other Findings   Reproductive/Obstetrics                            Anesthesia Physical Anesthesia Plan  ASA: III  Anesthesia Plan: MAC   Post-op Pain Management:    Induction: Intravenous  PONV Risk Score and Plan: 1 and Treatment may vary due to age or medical condition  Airway Management Planned: Nasal Cannula  Additional Equipment: None  Intra-op Plan:   Post-operative Plan:   Informed Consent: I have reviewed the patients History and Physical, chart, labs and discussed the procedure including the risks, benefits and alternatives for the proposed anesthesia with the patient or authorized representative who has indicated his/her understanding and acceptance.   Dental advisory given  Plan Discussed with: CRNA and Surgeon  Anesthesia Plan Comments:         Anesthesia Quick Evaluation

## 2017-06-26 NOTE — Discharge Instructions (Signed)
TEE  YOU HAD A CARDIAC PROCEDURE TODAY: Refer to the procedure report and other information in the discharge instructions given to you for any specific questions about what was found during the examination. If this information does not answer your questions, please call [facility] office at [phone] to clarify.   DIET: Your first meal following the procedure should be a light meal and then it is ok to progress to your normal diet. A half-sandwich or bowl of soup is an example of a good first meal. Heavy or fried foods are harder to digest and may make you feel nauseous or bloated. Drink plenty of fluids but you should avoid alcoholic beverages for 24 hours. If you had a esophageal dilation, please see attached instructions for diet.   ACTIVITY: Your care partner should take you home directly after the procedure. You should plan to take it easy, moving slowly for the rest of the day. You can resume normal activity the day after the procedure however YOU SHOULD NOT DRIVE, use power tools, machinery or perform tasks that involve climbing or major physical exertion for 24 hours (because of the sedation medicines used during the test).   SYMPTOMS TO REPORT IMMEDIATELY: A cardiologist can be reached at any hour. Please call [phone] for any of the following symptoms:  Vomiting of blood or coffee ground material  New, significant abdominal pain  New, significant chest pain or pain under the shoulder blades  Painful or persistently difficult swallowing  New shortness of breath  Black, tarry-looking or red, bloody stools  FOLLOW UP:  Please also call with any specific questions about appointments or follow up tests.

## 2017-06-26 NOTE — CV Procedure (Signed)
    Transesophageal Echocardiogram Note  Rachael Stone [id number] Jan 18, 1966  Procedure: Transesophageal Echocardiogram Indications: Aortic insufficiency   Procedure Details Consent: Obtained Time Out: Verified patient identification, verified procedure, site/side was marked, verified correct patient position, special equipment/implants available, Radiology Safety Procedures followed,  medications/allergies/relevent history reviewed, required imaging and test results available.  Performed  Medications:  During this procedure the patient is administered proofol drip by anesthesia - 238 mg  total for the procedure   Left Ventrical:  Normal LV function   Mitral Valve:  Mild MR  Aortic Valve: mod- severe AI , no vegetation seen  3D images acquired  Tricuspid Valve: trace - mild TR   Pulmonic Valve: trivial PI  Left Atrium/ Left atrial appendage:  No thrombi   Atrial septum:  Suggestion of a PFo with appearance of bubble in the LA shortly after bubbles appeared in the RA   Aorta: mild atheroma    Complications: No apparent complications Patient did tolerate procedure well.   Thayer Headings, Brooke Bonito., MD, [facility] 06/26/2017, 9:20 AM

## 2017-06-26 NOTE — Interval H&P Note (Signed)
History and Physical Interval Note:  06/26/2017 8:03 AM  [place]  has presented today for surgery, with the diagnosis of arotic insuffiency  The various methods of treatment have been discussed with the patient and family. After consideration of risks, benefits and other options for treatment, the patient has consented to  Procedure(s): TRANSESOPHAGEAL ECHOCARDIOGRAM (TEE) (N/A) as a surgical intervention .  The patient's history has been reviewed, patient examined, no change in status, stable for surgery.  I have reviewed the patient's chart and labs.  Questions were answered to the patient's satisfaction.     Mertie Moores

## 2017-06-28 ENCOUNTER — Encounter (HOSPITAL_COMMUNITY): Payer: Self-pay | Admitting: Cardiovascular Disease

## 2017-07-01 ENCOUNTER — Ambulatory Visit: Payer: Medicaid Other | Attending: Physician Assistant

## 2017-07-16 ENCOUNTER — Other Ambulatory Visit: Payer: Self-pay | Admitting: *Deleted

## 2017-07-16 DIAGNOSIS — E876 Hypokalemia: Secondary | ICD-10-CM

## 2017-07-16 LAB — BASIC METABOLIC PANEL
BUN / CREAT RATIO: 11 (ref 9–23)
BUN: 8 mg/dL (ref 6–24)
CHLORIDE: 104 mmol/L (ref 96–106)
CO2: 21 mmol/L (ref 20–29)
Calcium: 8 mg/dL — ABNORMAL LOW (ref 8.7–10.2)
Creatinine, Ser: 0.73 mg/dL (ref 0.57–1.00)
GFR calc non Af Amer: 97 mL/min/{1.73_m2} (ref >59)
GFR, EST AFRICAN AMERICAN: 112 mL/min/{1.73_m2} (ref >59)
GLUCOSE: 89 mg/dL (ref 65–99)
Potassium: 3.3 mmol/L — ABNORMAL LOW (ref 3.5–5.2)
Sodium: 141 mmol/L (ref 134–144)

## 2017-07-17 ENCOUNTER — Other Ambulatory Visit: Payer: Self-pay | Admitting: Nurse Practitioner

## 2017-07-17 DIAGNOSIS — E876 Hypokalemia: Secondary | ICD-10-CM

## 2017-07-17 MED ORDER — POTASSIUM CHLORIDE CRYS ER 20 MEQ PO TBCR: 20.0000 meq | EXTENDED_RELEASE_TABLET | Freq: Two times a day (BID) | ORAL | 3 refills | Status: DC

## 2017-07-27 MED FILL — POTASSIUM CL ER 20 MEQ TAB: 20 | 30 days supply | Qty: 30 | Fill #1

## 2017-07-28 ENCOUNTER — Other Ambulatory Visit: Payer: Self-pay | Admitting: *Deleted

## 2017-07-28 DIAGNOSIS — E876 Hypokalemia: Secondary | ICD-10-CM

## 2017-07-29 LAB — BASIC METABOLIC PANEL
BUN/Creatinine Ratio: 9 (ref 9–23)
BUN: 8 mg/dL (ref 6–24)
CALCIUM: 8.7 mg/dL (ref 8.7–10.2)
CO2: 23 mmol/L (ref 20–29)
CREATININE: 0.89 mg/dL (ref 0.57–1.00)
Chloride: 106 mmol/L (ref 96–106)
GFR calc Af Amer: 88 mL/min/{1.73_m2} (ref >59)
GFR calc non Af Amer: 76 mL/min/{1.73_m2} (ref >59)
Glucose: 97 mg/dL (ref 65–99)
Potassium: 4.3 mmol/L (ref 3.5–5.2)
SODIUM: 139 mmol/L (ref 134–144)

## 2017-08-12 ENCOUNTER — Telehealth: Payer: Self-pay | Admitting: Physician Assistant

## 2017-08-12 NOTE — Telephone Encounter (Signed)
I received a message that the patient came by the office asking for her [place] letter.   According to her chart, she was denied CAFA and her denial period is 05/22/17 to 11/19/17.  She can reapply after her denial expires.  I left patient a message that I mailed her orange card to her home address and to call the office if she had other questions

## 2017-08-27 ENCOUNTER — Encounter (INDEPENDENT_AMBULATORY_CARE_PROVIDER_SITE_OTHER): Payer: Self-pay | Admitting: Physician Assistant

## 2017-08-27 ENCOUNTER — Ambulatory Visit (INDEPENDENT_AMBULATORY_CARE_PROVIDER_SITE_OTHER): Payer: Self-pay | Admitting: Physician Assistant

## 2017-08-27 ENCOUNTER — Encounter (HOSPITAL_COMMUNITY): Payer: Self-pay | Admitting: Emergency Medicine

## 2017-08-27 ENCOUNTER — Emergency Department (HOSPITAL_COMMUNITY)
Admission: EM | Admit: 2017-08-27 | Discharge: 2017-08-27 | Disposition: A | Discharge status: Home / Self Care | Payer: Medicaid Other | Attending: Emergency Medicine | Admitting: Emergency Medicine

## 2017-08-27 VITALS — BP 132/72 | HR 71 | Temp 98.1°F | Resp 18 | Ht 65.5 in | Wt 150.0 lb

## 2017-08-27 DIAGNOSIS — H547 Unspecified visual loss: Secondary | ICD-10-CM | POA: Insufficient documentation

## 2017-08-27 DIAGNOSIS — Z5321 Procedure and treatment not carried out due to patient leaving prior to being seen by health care provider: Secondary | ICD-10-CM | POA: Insufficient documentation

## 2017-08-27 DIAGNOSIS — I10 Essential (primary) hypertension: Secondary | ICD-10-CM

## 2017-08-27 DIAGNOSIS — H53133 Sudden visual loss, bilateral: Secondary | ICD-10-CM

## 2017-08-27 MED ORDER — AMLODIPINE BESYLATE 10 MG PO TABS: 10.0000 mg | ORAL_TABLET | Freq: Every day | ORAL | 3 refills | Status: DC

## 2017-08-27 MED ORDER — FUROSEMIDE 40 MG PO TABS: 40.0000 mg | ORAL_TABLET | Freq: Every day | ORAL | 3 refills | Status: DC

## 2017-08-27 NOTE — Progress Notes (Signed)
Subjective:  Patient ID: Rachael Stone, female    DOB: 04/17/1966  Age: 52 y.o. MRN: [id number]  CC: loss of vision  HPI Patrice A Johnsonis a 52 y.o.femalewith a PMH of HTN, cardiomyopathy, aortic insufficiency, mitral regurgitation, ETOH abuse, tobacco abuse, and marijuana abuse presents with sudden loss of vision of both eyes. Onset one month ago with two episodes of sudden blindness. Works as a Scientist, water quality and says there was an occurrence of a customer in front of her "just disappearing for a moment". Can not say if the customer was just blurry or if there was a temporary loss of vision in the area where the customer was standing. Visual acuity testing by handheld Snellen in clinic is 20/50 OS and 20/30 OD. Does not endorse headache, eye pain, weakness, tingling, numbness, LE edema, CP, palpitations, SOB, abdominal pain, f/c/n/v, rash, or GI/GU sxs.     Outpatient Medications Prior to Visit  Medication Sig Dispense Refill  . amLODipine (NORVASC) 10 MG tablet Take 10 mg by mouth daily.  1  . Aspirin-Caffeine (BACK & BODY EXTRA STRENGTH PO) Take 1 tablet by mouth every 6 (six) hours as needed (pain).    . carvedilol (COREG) 6.25 MG tablet Take 1 tablet (6.25 mg total) 2 (two) times daily by mouth. (Patient taking differently: Take 6.25 mg by mouth daily. ) 180 tablet 3  . furosemide (LASIX) 40 MG tablet Take 1 tablet (40 mg total) by mouth daily. 30 tablet 5  . hydrOXYzine (ATARAX/VISTARIL) 25 MG tablet Take 25 mg by mouth 2 (two) times daily.    [name] LOSARTAN POTASSIUM PO Take 25 mg by mouth daily.    . potassium chloride SA (K-DUR,KLOR-CON) 20 MEQ tablet Take 1 tablet (20 mEq total) by mouth 2 (two) times daily. 180 tablet 3  . triamcinolone cream (KENALOG) 0.5 % Apply 1 application topically 3 (three) times daily as needed (for dry/irritated/itching skin.).     No facility-administered medications prior to visit.      ROS Review of Systems  Constitutional: Negative for chills, fever  and malaise/fatigue.  Eyes: Positive for blurred vision. Negative for pain.       Loss of vision  Respiratory: Negative for shortness of breath.   Cardiovascular: Negative for chest pain and palpitations.  Gastrointestinal: Negative for abdominal pain and nausea.  Genitourinary: Negative for dysuria and hematuria.  Musculoskeletal: Negative for joint pain and myalgias.  Skin: Negative for rash.  Neurological: Negative for tingling and headaches.  Psychiatric/Behavioral: Negative for depression. The patient is not nervous/anxious.     Objective:  BP 132/72 (BP Location: Left Arm, Patient Position: Sitting, Cuff Size: Normal)   Pulse 71   Temp 98.1 F (36.7 C) (Oral)   Resp 18   Ht 5' 5.5" (1.664 m)   Wt 150 lb (68 kg)   LMP 08/26/2017   SpO2 100%   BMI 24.58 kg/m   BP/Weight 08/27/2017 06/26/2017 96/08/8364  Systolic BP 294 765 465  Diastolic BP 72 61 68  Wt. (Lbs) 150 - 150  BMI 24.58 - 24.96      Physical Exam  Constitutional: She is oriented to person, place, and time.  Well developed, well nourished, NAD, polite  HENT:  Head: Normocephalic and atraumatic.  Eyes: Conjunctivae and EOM are normal. Pupils are equal, round, and reactive to light. Right eye exhibits no discharge. Left eye exhibits no discharge. No scleral icterus.  Neck: Normal range of motion. Neck supple. No thyromegaly present.  Cardiovascular: Normal rate,  regular rhythm and normal heart sounds.  Pulmonary/Chest: Effort normal and breath sounds normal.  Musculoskeletal: She exhibits no edema.  Lymphadenopathy:    She has no cervical adenopathy.  Neurological: She is alert and oriented to person, place, and time. No cranial nerve deficit. Coordination normal.  Skin: Skin is warm and dry. No rash noted. No erythema. No pallor.  Psychiatric: She has a normal mood and affect. Her behavior is normal. Thought content normal.  Vitals reviewed.    Assessment & Plan:   1. Sudden visual loss of both  eyes - Patient advised to go to ED immediately. Will need head CT, ophthalmology exam, and work up for clotting.  2. Essential hypertension - Refill furosemide (LASIX) 40 MG tablet; Take 1 tablet (40 mg total) by mouth daily.  Dispense: 90 tablet; Refill: 3 - Refill amLODipine (NORVASC) 10 MG tablet; Take 1 tablet (10 mg total) by mouth daily.  Dispense: 90 tablet; Refill: 3   Meds ordered this encounter  Medications  . furosemide (LASIX) 40 MG tablet    Sig: Take 1 tablet (40 mg total) by mouth daily.    Dispense:  90 tablet    Refill:  3    Order Specific Question:   Supervising Provider    Answer:   Tresa Garter [id number]  . amLODipine (NORVASC) 10 MG tablet    Sig: Take 1 tablet (10 mg total) by mouth daily.    Dispense:  90 tablet    Refill:  3    Order Specific Question:   Supervising Provider    Answer:   Tresa Garter [id number]    Follow-up: Return if symptoms worsen or fail to improve, for After ED.   Clent Demark PA

## 2017-08-27 NOTE — ED Triage Notes (Signed)
Patient sent from PCP for sudden loss of vision that been going on since December but got worse Monday.  Reports that vision loss then after few minutes of blinking and focusing vision will come back.

## 2017-08-27 NOTE — Patient Instructions (Addendum)
You must go to the ED upon leaving this clinic. You need emergent evaluation of the brain, eyes, and clots.    Amaurosis Fugax Amaurosis fugax is a condition in which you temporarily lose sight in one eye. The loss of vision in the affected eye may be total or partial. The vision loss usually lasts for only a few seconds or minutes before sight returns to normal. Occasionally, it may last for several hours. This condition is caused by interruption of blood flow in the artery that supplies blood to the retina. The retina is the part of your eye that contains the nerves needed for sight. This condition can be a warning sign of a stroke. A stroke can result in permanent vision loss or loss of other body functions. What are the causes? This condition is caused by a loss or interruption of blood flow to the retinal artery. Causes for the change in blood flow include:  Atherosclerosis, which is a buildup of cholesterol and fats (plaque). If some of that plaque breaks off and gets into the bloodstream, it can travel (embolize) to other blood vessels, such as the retinal artery.  Diseases of the heart valves.  Certain diseases of the blood, such as sickle cell anemia and leukemia.  Blood clotting (coagulation) disorders.  Inflammation of the arteries (vasculitis).  An irregular heartbeat, such as atrial fibrillation.  What increases the risk? The following factors may make you more likely to develop this condition:  Use of any tobacco products, including cigarettes, chewing tobacco, or electronic cigarettes.  Poorly controlled diabetes.  Heart disease.  High blood pressure (hypertension).  High cholesterol.  Excessive alcohol use.  Use of illegal drugs, especially cocaine.  Age. The risk increases with age.  Family history of stroke.  What are the signs or symptoms? The main symptom of this condition is painless, sudden loss of vision in one eye. The vision loss often starts at the  top and moves down, as if a curtain is being pulled down over your eye. This is usually followed by a quick return of vision. However, symptoms may last for several hours. It is important to seek medical care right away even if your symptoms go away. How is this diagnosed? This condition is diagnosed by:  Medical history and physical exam.  Eye exam.  Carotid ultrasound. This checks to see if plaque has built up in the carotid arteries in your neck.  Magnetic resonance angiography (MRA). This checks the anatomy of the carotid artery and the branches that supply the brain. It looks for areas of blockage or disease.  You may also have blood tests, an electrocardiogram (ECG) to check your heart rhythm, and an echocardiogram (ECHO) to check your heart function. How is this treated? Emergency treatment for this condition may involve massaging the eyeball or using certain breathing techniques to remove or ease the blockage of the retinal artery. Other treatments for this condition focuses on reducing your risk of having a stroke in the future. This may include:  Medicines. You may be given medicines to control high blood pressure, manage diabetes, or manage cholesterol, if needed. Blood thinning medicines may also be prescribed.  Procedures to either remove plaque in your carotid arteries or widen carotid arteries that have become narrow due to plaque. These may include: ? Carotid endarterectomy. This is a surgical procedure that removes plaque from the carotid artery. ? Carotid angioplasty and stenting. In this procedure, a thin tube with a balloon is placed into  the artery and is then inflated to open the blocked portion of the artery. A stent can be placed to keep that section of the artery open.  Lifestyle changes to reduce your risk of having a stroke. These can include changing your diet and getting enough exercise.  It is possible that you may not need any immediate treatment. Follow these  instructions at home: Eating and drinking  Eat a diet that includes five or more servings of fruits and vegetables each day. This may reduce the risk of stroke. Certain diets may be recommended to deal with high blood pressure, high cholesterol, diabetes, or obesity.  A diet that is low in sodium is recommended to manage high blood pressure.  A high-fiber diet that is low in saturated fat, trans fat, and cholesterol may control cholesterol levels.  A low-carbohydrate, low-sugar diet is recommended to manage diabetes.  A reduced-calorie diet that is low in sodium, saturated fat, trans fat, and cholesterol is recommended to manage obesity. Lifestyle  Maintain a healthy weight.  Stay physically active. It is recommended that you get at least 30 minutes of activity on most or all days.  Do not use any products that contain nicotine or tobacco, such as cigarettes and e-cigarettes. If you need help quitting, ask your health care provider.  Limit alcohol intake to no more than 1 drink a day for nonpregnant women and 2 drinks a day for men. One drink equals 12 oz of beer, 5 oz of wine, or 1 oz of hard liquor.  Do not abuse drugs. General instructions  Take over-the-counter and prescription medicines only as told by your health care provider. Follow the directions carefully. Make sure that you understand all of your medicine instructions. Medicines may be used to control the risk factors of stroke.  Keep all follow-up visits as told by your health care provider. This is important. Contact a health care provider if:  You lose vision in one eye or both eyes. Get help right away if:  You have chest pain or an irregular heartbeat.  You have any symptoms of stroke. The acronym BEFAST is an easy way to remember the main warning signs of stroke. ? B = Balance problems. Signs include dizziness, sudden trouble walking, or loss of balance. ? E = Eye problems. This includes trouble seeing or a  sudden change in vision. ? F = Face changes. This includes sudden weakness or numbness of the face, or the face or eyelid drooping to one side. ? A = Arm weakness or numbness. This happens suddenly and usually on one side of the body. ? S = Speech problems. This includes trouble speaking or trouble understanding. ? T = Time. Time to call 911 or seek emergency care. Do not wait to see if symptoms will go away. Make note of the time your symptoms started.  Other signs of stroke may include: ? A sudden, severe headache with no known cause. ? Nausea or vomiting. ? Seizure. These symptoms may represent a serious problem that is an emergency. Do not wait to see if the symptoms will go away. Get medical help right away. Call your local emergency services (911 in the [place] Do not drive yourself to the hospital. Summary  Amaurosis fugax is a condition in which you temporarily lose your sight in one eye.  This condition can be a warning sign of a stroke. A stroke can result in permanent vision loss or loss of other body functions.  Seek medical  care right away even if your symptoms go away. This information is not intended to replace advice given to you by your health care provider. Make sure you discuss any questions you have with your health care provider. Document Released: 04/08/2008 Document Revised: 07/03/2016 Document Reviewed: 07/03/2016 Elsevier Interactive Patient Education  2017 [employer].

## 2017-08-28 ENCOUNTER — Emergency Department (HOSPITAL_COMMUNITY)
Admission: EM | Admit: 2017-08-28 | Discharge: 2017-08-28 | Disposition: A | Discharge status: Home / Self Care | Payer: Self-pay | Attending: Emergency Medicine | Admitting: Emergency Medicine

## 2017-08-28 ENCOUNTER — Encounter (HOSPITAL_COMMUNITY): Payer: Self-pay | Admitting: Emergency Medicine

## 2017-08-28 ENCOUNTER — Other Ambulatory Visit: Payer: Self-pay

## 2017-08-28 ENCOUNTER — Emergency Department (HOSPITAL_COMMUNITY): Payer: Self-pay

## 2017-08-28 DIAGNOSIS — I5042 Chronic combined systolic (congestive) and diastolic (congestive) heart failure: Secondary | ICD-10-CM | POA: Insufficient documentation

## 2017-08-28 DIAGNOSIS — Z79899 Other long term (current) drug therapy: Secondary | ICD-10-CM | POA: Insufficient documentation

## 2017-08-28 DIAGNOSIS — F17210 Nicotine dependence, cigarettes, uncomplicated: Secondary | ICD-10-CM | POA: Insufficient documentation

## 2017-08-28 DIAGNOSIS — I110 Hypertensive heart disease with heart failure: Secondary | ICD-10-CM | POA: Insufficient documentation

## 2017-08-28 DIAGNOSIS — H539 Unspecified visual disturbance: Secondary | ICD-10-CM

## 2017-08-28 DIAGNOSIS — H538 Other visual disturbances: Secondary | ICD-10-CM | POA: Insufficient documentation

## 2017-08-28 LAB — I-STAT CHEM 8, ED
BUN: 11 mg/dL (ref 6–20)
CHLORIDE: 104 mmol/L (ref 101–111)
Calcium, Ion: 1.13 mmol/L — ABNORMAL LOW (ref 1.15–1.40)
Creatinine, Ser: 0.8 mg/dL (ref 0.44–1.00)
GLUCOSE: 90 mg/dL (ref 65–99)
HCT: 43 % (ref 36.0–46.0)
Hemoglobin: 14.6 g/dL (ref 12.0–15.0)
Potassium: 3.5 mmol/L (ref 3.5–5.1)
Sodium: 141 mmol/L (ref 135–145)
TCO2: 24 mmol/L (ref 22–32)

## 2017-08-28 LAB — C-REACTIVE PROTEIN

## 2017-08-28 LAB — CBC WITH DIFFERENTIAL/PLATELET
BASOS ABS: 0 10*3/uL (ref 0.0–0.1)
BASOS PCT: 0 %
Eosinophils Absolute: 0.3 10*3/uL (ref 0.0–0.7)
Eosinophils Relative: 4 %
HCT: 38.8 % (ref 36.0–46.0)
HEMOGLOBIN: 13.2 g/dL (ref 12.0–15.0)
Lymphocytes Relative: 36 %
Lymphs Abs: 2.4 10*3/uL (ref 0.7–4.0)
MCH: 29 pg (ref 26.0–34.0)
MCHC: 34 g/dL (ref 30.0–36.0)
MCV: 85.3 fL (ref 78.0–100.0)
MONO ABS: 0.5 10*3/uL (ref 0.1–1.0)
Monocytes Relative: 8 %
NEUTROS ABS: 3.4 10*3/uL (ref 1.7–7.7)
NEUTROS PCT: 52 %
PLATELETS: 224 10*3/uL (ref 150–400)
RBC: 4.55 MIL/uL (ref 3.87–5.11)
RDW: 16.5 % — ABNORMAL HIGH (ref 11.5–15.5)
WBC: 6.6 10*3/uL (ref 4.0–10.5)

## 2017-08-28 LAB — SEDIMENTATION RATE: Sed Rate: 3 mm/hr (ref 0–22)

## 2017-08-28 MED ORDER — SODIUM CHLORIDE 0.9 % IJ SOLN
INTRAMUSCULAR | Status: AC
  Filled 2017-08-28: qty 50

## 2017-08-28 MED ORDER — IOPAMIDOL (ISOVUE-370) INJECTION 76%
INTRAVENOUS | Status: AC
  Administered 2017-08-28: 100 mL
  Filled 2017-08-28: qty 100

## 2017-08-28 NOTE — Discharge Instructions (Signed)
Please follow up with your primary care provider and eye specialist for further evaluation of your intermittent vision loss.  If your symptoms return, please return to the ER promptly for further evaluation.

## 2017-08-28 NOTE — ED Notes (Addendum)
Patient has been having moments of blurred vision that does not last more than a minute and a half. Patient is usually standing up at her job when this happens or walking. Patient states she does not take her medications every day. Patient states, " I take my medication Monday, Wednesday, and Friday because it does not fit my life style. The lasix makes me use the bathroom too much. I do not have time for it." Patient asked her doctor yesterday if she could take something else other than lasix or just take it every other day. She states her doctor said no.

## 2017-08-28 NOTE — ED Notes (Signed)
Pt sent by doctor for evaluation of "brain, eyes, and clots" related to intermittent vision loss/blurriness since December.

## 2017-08-28 NOTE — ED Provider Notes (Signed)
[facility] DEPT Provider Note   CSN: [id number] Arrival date & time: 08/28/17  1024     History   Chief Complaint Chief Complaint  Patient presents with  . Loss of Vision    HPI Rachael Stone is a 52 y.o. female.  HPI   52 year old female with history of alcohol abuse, 1 abuse, mitral valve regurgitation, cardiomyopathy, tobacco abuse sent here by PCP for evaluation of intermittent vision loss.  Patient states since December she has had 4-5 episodes of vision loss.  She described as not being able to see through both eyes lasting for approximately 1 minute and resolved without any specific treatment.  This could brought on at any time and not related to positional changes.  There is no associated headache, diplopia, confusion, sinus problem, neck pain chest pain or focal numbness or weakness.  Last episode was 2 days ago while she was in the store.  She went to see her PCP yesterday for condition and was recommended to come to the ER however after a long wait in the ED patient decided to come back here again today for further management.  She admits to smoking approximately 10 cigarettes a day and drinks 3-412 ounce beer daily.  She denies any prior history of stroke or heart attack.  She is not on any blood thinner medication.    Past Medical History:  Diagnosis Date  . Back pain   . Cardiomyopathy ([name])    a. 04/08/2016 Echo: EF 35%, diff HK, mild LVH, Gr2 DD, mod AI, mod to sev MR, mildly dil LA.  [name] ETOH abuse   . Hypertensive heart disease with heart failure ([place])   . Marijuana abuse   . Moderate aortic insufficiency    a. 04/08/2016 Echo: mod AI.  [name] Moderate to Severe Mitral Regurgitation    a. 04/08/2016 Echo: mod-sev MR directed centrally.  . Tobacco abuse     Patient Active Problem List   Diagnosis Date Noted  . BV (bacterial vaginosis) 01/26/2017  . Trichomonal infection 01/26/2017  . Essential hypertension 11/14/2016  . Normal  coronary arteries 04/11/2016  . Hypertensive heart disease with heart failure ([place])   . NICM (nonischemic cardiomyopathy) ([place])   . Nonrheumatic aortic valve insufficiency   . Moderate to Severe Mitral Regurgitation   . Tobacco abuse   . Acute pulmonary edema ([place]) 04/08/2016  . Elevated blood pressure 04/08/2016  . Dyspnea   . Acute combined systolic and diastolic heart failure ([place]) 04/07/2016  . VAGINAL DISCHARGE 09/10/2009  . CHEST PAIN UNSPECIFIED 08/13/2009  . DEPRESSIVE DISORDER NOT ELSEWHERE CLASSIFIED 12/14/2008  . MENORRHAGIA 12/14/2008  . NAUSEA, CHRONIC 12/14/2008  . ELEVATED BLOOD PRESSURE WITHOUT DIAGNOSIS OF HYPERTENSION 12/14/2008  . DYSFUNCTIONAL UTERINE BLEEDING 11/12/2007  . TOBACCO DEPENDENCE 09/10/2006  . GASTROESOPHAGEAL REFLUX, NO ESOPHAGITIS 09/10/2006    Past Surgical History:  Procedure Laterality Date  . CARDIAC CATHETERIZATION N/A 04/10/2016   Procedure: Right/Left Heart Cath and Coronary Angiography;  Surgeon: [name], MD;  Location: [facility];  Service: Cardiovascular;  Laterality: N/A;  . EXPLORATORY LAPAROTOMY  2003   Ectopic Pregnancy - unsure which side or if tube was removed  . TEE WITHOUT CARDIOVERSION N/A 06/26/2017   Procedure: TRANSESOPHAGEAL ECHOCARDIOGRAM (TEE);  Surgeon: [name], MD;  Location: [facility]COPY;  Service: Cardiovascular;  Laterality: N/A;  . TUBAL LIGATION      OB History    Gravida Para Term Preterm AB Living  3 2 0 0 1 2   SAB TAB Ectopic Multiple Live Births   0 0 1 0         Home Medications    Prior to Admission medications   Medication Sig Start Date End Date Taking? Authorizing Provider  amLODipine (NORVASC) 10 MG tablet Take 1 tablet (10 mg total) by mouth daily. 08/27/17  Yes Clent Demark, PA-C  carvedilol (COREG) 6.25 MG tablet Take 1 tablet (6.25 mg total) 2 (two) times daily by mouth. Patient taking differently: Take 6.25 mg by mouth daily.  06/02/17  Yes Lyda Jester M, PA-C  furosemide (LASIX) 40 MG tablet Take 1 tablet (40 mg total) by mouth daily. 08/27/17  Yes Clent Demark, PA-C  potassium chloride SA (K-DUR,KLOR-CON) 20 MEQ tablet Take 1 tablet (20 mEq total) by mouth 2 (two) times daily. 07/17/17 10/15/17 Yes Nahser, Wonda Cheng, MD    Family History Family History  Problem Relation Age of Onset  . CAD Mother        First MI @ 80 - 21 total.  . Cirrhosis Father        alcoholic - died in his 26'O.  . Lupus Sister     Social History Social History   Tobacco Use  . Smoking status: Current Every Day Smoker    Packs/day: 0.50    Years: 15.00    Pack years: 7.50    Types: Cigarettes  . Smokeless tobacco: Never Used  Substance Use Topics  . Alcohol use: Yes    Comment: At least 12 ounces of homemade moon shine every other day.  . Drug use: Yes    Types: Marijuana    Comment: Smokes marijuana daily.  Used to smoke cocaine laced  marijuana cigarettes for 15-20 yrs but quit cocaine 6 yrs ago.     Allergies   Losartan and Ibuprofen   Review of Systems Review of Systems  All other systems reviewed and are negative.    Physical Exam Updated Vital Signs BP 123/72 (BP Location: Left Arm)   Pulse 80   Temp 98.1 F (36.7 C) (Oral)   Resp 18   LMP 08/26/2017   SpO2 100%   Physical Exam  Constitutional: She is oriented to person, place, and time. She appears well-developed and well-nourished. No distress.  HENT:  Head: Atraumatic.  Right Ear: External ear normal.  Left Ear: External ear normal.  Nose: Nose normal.  Mouth/Throat: Oropharynx is clear and moist.  Eyes: Conjunctivae and EOM are normal. Pupils are equal, round, and reactive to light.  Neck: Normal range of motion. Neck supple.  Cardiovascular: Normal rate and regular rhythm.  Murmur (2 out of 6 systolic heart murmur) heard. Pulmonary/Chest: Effort normal and breath sounds normal.  Abdominal: Soft. Bowel sounds are normal. She exhibits no distension. There  is no tenderness.  Musculoskeletal:  5/5 strength to all 4 extremities  Neurological: She is alert and oriented to person, place, and time.  Neurologic exam:  Speech clear, pupils equal round reactive to light, extraocular movements intact  Normal peripheral visual fields Cranial nerves III through XII normal including no facial droop Follows commands, moves all extremities x4, normal strength to bilateral upper and lower extremities at all major muscle groups including grip Sensation normal to light touch and pinprick Coordination intact, no limb ataxia, finger-nose-finger normal Rapid alternating movements normal No pronator drift Gait normal   Skin: No rash noted.  Psychiatric: She has a normal mood and affect.  Nursing note and vitals reviewed.    ED Treatments / Results  Labs (all labs ordered are listed, but only abnormal results are displayed) Labs Reviewed  CBC WITH DIFFERENTIAL/PLATELET - Abnormal; Notable for the following components:      Result Value   RDW 16.5 (*)    All other components within normal limits  I-STAT CHEM 8, ED - Abnormal; Notable for the following components:   Calcium, Ion 1.13 (*)    All other components within normal limits  SEDIMENTATION RATE  C-REACTIVE PROTEIN    EKG  EKG Interpretation None      Date: 08/28/2017  Rate: 75  Rhythm: normal sinus rhythm  QRS Axis: normal  Intervals: normal  ST/T Wave abnormalities: normal  Conduction Disutrbances: none  Narrative Interpretation:   Old EKG Reviewed: No significant changes noted     Radiology Ct Angio Head W Or Wo Contrast  Result Date: 08/28/2017 CLINICAL DATA:  Blurry vision EXAM: CT ANGIOGRAPHY HEAD TECHNIQUE: Multidetector CT imaging of the head was performed using the standard protocol during bolus administration of intravenous contrast. Multiplanar CT image reconstructions and MIPs were obtained to evaluate the vascular anatomy. CONTRAST:  176mL ISOVUE-370 IOPAMIDOL  (ISOVUE-370) INJECTION 76% COMPARISON:  Noncontrast head CT from earlier today FINDINGS: CTA HEAD Anterior circulation: Distal cervical ICA tortuosity without stenosis definite beading. There is calcified plaque on both carotid siphons. No major branch occlusion or flow limiting stenosis. Negative for aneurysm. Posterior circulation: The left vertebral artery is small and essentially ends in the PICA-which is notably tortuous. There is a dominant right AICA. Small basilar in the setting of bilateral fetal type PCA. Posterior circulation vessels are diffusely smooth and patent. Venous sinuses: Patent Anatomic variants: As above Delayed phase: No abnormal intracranial enhancement. IMPRESSION: No acute finding or flow limiting stenosis. Carotid siphon atherosclerosis. Electronically Signed   By: Monte Fantasia M.D.   On: 08/28/2017 15:54   Ct Head Wo Contrast  Result Date: 08/28/2017 CLINICAL DATA:  Diplopia, blurred vision. EXAM: CT HEAD WITHOUT CONTRAST TECHNIQUE: Contiguous axial images were obtained from the base of the skull through the vertex without intravenous contrast. COMPARISON:  None. FINDINGS: Brain: No acute intracranial abnormality. Specifically, no hemorrhage, hydrocephalus, mass lesion, acute infarction, or significant intracranial injury. Vascular: No hyperdense vessel or unexpected calcification. Skull: No acute calvarial abnormality. Sinuses/Orbits: Visualized paranasal sinuses and mastoids clear. Orbital soft tissues unremarkable. Other: None IMPRESSION: Normal study. Electronically Signed   By: Rolm Baptise M.D.   On: 08/28/2017 11:08    Procedures Procedures (including critical care time)  Medications Ordered in ED Medications  sodium chloride 0.9 % injection (not administered)  iopamidol (ISOVUE-370) 76 % injection (100 mLs  Contrast Given 08/28/17 1512)     Initial Impression / Assessment and Plan / ED Course  I have reviewed the triage vital signs and the nursing  notes.  Pertinent labs & imaging results that were available during my care of the patient were reviewed by me and considered in my medical decision making (see chart for details).     BP 139/68   Pulse 74   Temp 98.1 F (36.7 C) (Oral)   Resp (!) 25   LMP 08/26/2017   SpO2 99%    Final Clinical Impressions(s) / ED Diagnoses   Final diagnoses:  Vision disturbance    ED Discharge Orders    None     1:55 PM Patient with intermittent bilateral vision loss suggestive of amaurosis fugax.  Last episode was 2  days ago.  She has currently normal vision, head CT scan is unremarkable, labs are reassuring.  Sed rate and C-reactive protein ordered.  I discussed with Dr. Francia Greaves, plan to consult neurology for further recommendation.  2:28 PM Case discussed unofficially with neurologist Dr. Cheral Marker who felt her intermittent bilateral vision loss is unlikely neurologic.  He recommend head CTA to assess vascular pathology.  If normal, pt may benefit from outpt cardiology work up and Holter monitoring if necessary.  Will obtain appropriate scan. Pt's ABCD2 score is zero.   4:03 PM head CTA shows no acute finding or flow limiting stenosis.  Carotid Siphon atherosclerosis.  I discussed this finding with pt.  I recommend close f/u with her PCP and ophthalmology for further evaluation. Pt may need to be evaluated by cardiology outpt for further management.  Recommend prompt return if sxs returns.    Domenic Moras, PA-C 08/28/17 1607    Valarie Merino, MD 08/28/17 [phone]

## 2017-09-11 ENCOUNTER — Ambulatory Visit (INDEPENDENT_AMBULATORY_CARE_PROVIDER_SITE_OTHER): Payer: Self-pay | Admitting: Cardiovascular Disease

## 2017-09-11 ENCOUNTER — Encounter: Payer: Self-pay | Admitting: Cardiovascular Disease

## 2017-09-11 VITALS — BP 122/58 | HR 85 | Ht 65.0 in | Wt 149.1 lb

## 2017-09-11 DIAGNOSIS — I340 Nonrheumatic mitral (valve) insufficiency: Secondary | ICD-10-CM

## 2017-09-11 DIAGNOSIS — I351 Nonrheumatic aortic (valve) insufficiency: Secondary | ICD-10-CM

## 2017-09-11 DIAGNOSIS — I5022 Chronic systolic (congestive) heart failure: Secondary | ICD-10-CM

## 2017-09-11 MED ORDER — FUROSEMIDE 20 MG PO TABS: 20.0000 mg | ORAL_TABLET | Freq: Every day | ORAL | 3 refills | Status: DC

## 2017-09-11 NOTE — Patient Instructions (Signed)
Medication Instructions:  Your physician has recommended you make the following change in your medication:  DECREASE Lasix (Furosemide) to 20 mg daily START Losartan (Cozaar) 25 mg once daily   Labwork: Your physician recommends that you return for lab work in: 3 weeks for basic metabolic panel   Testing/Procedures: None Ordered   Follow-Up: **Call [facility] Lull if you start to notice swelling, increased shortness of breath, or weight gain  Your physician recommends that you schedule a follow-up appointment in: 3 months with Dr. Acie Fredrickson    If you need a refill on your cardiac medications before your next appointment, please call your pharmacy.   Thank you for choosing [facility]! Christen Bame, RN [phone]

## 2017-09-11 NOTE — Progress Notes (Signed)
Cardiology Office Note   Date:  09/11/2017   ID:  Rachael Stone, DOB 20-Dec-1965, MRN [id number]  PCP:  Clent Demark, PA-C  Cardiologist:  Dr. Acie Fredrickson    Chief Complaint  Patient presents with  . Congestive Heart Failure      Notes from [name]: Rachael Stone is a 52 y.o. female who presents for post hospital for progressive dyspnea and orthopnea and has been found to have LV dysfunction (EF 35%), mod AI, and mod - sev MR.  She has a  h/o tob, alcohol, marijuana, and remote cocaine abuse.  She has no prior cardiac hx.  She had CTA of chest without PE + pulmonary edema and LUL nodule of 52mm.  Echo with EF 30% and mod-sev MR and mod AI. [name]    She had R & L cardiac cath   LV end diastolic pressure is normal.  There is moderate left ventricular systolic dysfunction.  The left ventricular ejection fraction is 35% by visual estimate.  There is no aortic valve stenosis.  LV end diastolic pressure is normal  Normal coronary arteries.  She has done well no further SOB, swelling, which was in her abd or chest pain.  She has decreased cigarettes to 5 per day down from 10 per day.  Could not afford nicotene patches.  She has cut out salt in her diet.  Unfortunately she has not taken her meds for 2 days, she is worried they will hurt her heart.  We had a long discussion on what happened to her heart and the weakness of her ventricle. We discussed how important the meds were to improve her heart.     Feb.   2, 2018: Seen with her sister, Rachael Stone.   She stopped her meds when they ran out in Dec.   Says she felt better off the meds.   She says that she does not have any symptoms.   Does not want to restart meds.   Saw [name] who refilled her meds.  Patient did not Get her medications filled. She states that she does not understand why she needs to take these medications and does not want to take medications or her life.  Nov 14, 2016:  Rachael Stone is seen today with  her sister, Rachael Stone.  No CP or dyspnea.    Donates plasma twice a week.   BP is typically normal there  Dec. 6,2018:  Rachael Stone had an echocardiogram recently that revealed worsening systolic congestive heart failure and moderate to severe aortic insufficiency. Seen with her sister , Rachael Stone  No real increase in dyspnea.   September 11, 2017:   Rachael Stone is seen by her self today . Seems to be doing well   Has mild dyspnea.   Had a TEE  Her ejection fraction is now normal with an EF of 55-60%.  She has moderate aortic insufficiency, mild mitral regurgitation Her left ventricular function has improved and her aortic insufficiency and mitral regurgitation have both improved as well since her previous echo.  Complains about having to urinate so much with the lasix   Past Medical History:  Diagnosis Date  . Back pain   . Cardiomyopathy ([place])    a. 04/08/2016 Echo: EF 35%, diff HK, mild LVH, Gr2 DD, mod AI, mod to sev MR, mildly dil LA.  [name] ETOH abuse   . Hypertensive heart disease with heart failure ([place])   . Marijuana abuse   . Moderate aortic insufficiency  a. 04/08/2016 Echo: mod AI.  [name] Moderate to Severe Mitral Regurgitation    a. 04/08/2016 Echo: mod-sev MR directed centrally.  . Tobacco abuse     Past Surgical History:  Procedure Laterality Date  . CARDIAC CATHETERIZATION N/A 04/10/2016   Procedure: Right/Left Heart Cath and Coronary Angiography;  Surgeon: [name], MD;  Location: [facility];  Service: Cardiovascular;  Laterality: N/A;  . EXPLORATORY LAPAROTOMY  2003   Ectopic Pregnancy - unsure which side or if tube was removed  . TEE WITHOUT CARDIOVERSION N/A 06/26/2017   Procedure: TRANSESOPHAGEAL ECHOCARDIOGRAM (TEE);  Surgeon: [name], MD;  Location[facility] ENDOSCOPY;  Service: Cardiovascular;  Laterality: N/A;  . TUBAL LIGATION       Current Outpatient Medications  Medication Sig Dispense Refill  . amLODipine (NORVASC) 10 MG tablet Take 1 tablet (10  mg total) by mouth daily. 90 tablet 3  . carvedilol (COREG) 6.25 MG tablet Take 1 tablet (6.25 mg total) 2 (two) times daily by mouth. 180 tablet 3  . furosemide (LASIX) 40 MG tablet Take 1 tablet (40 mg total) by mouth daily. 90 tablet 3  . losartan (COZAAR) 25 MG tablet Take 25 mg by mouth daily.    . potassium chloride SA (K-DUR,KLOR-CON) 20 MEQ tablet Take 1 tablet (20 mEq total) by mouth 2 (two) times daily. 180 tablet 3   No current facility-administered medications for this visit.     Allergies:   Losartan and Ibuprofen    Social History:  The patient  reports that she has been smoking cigarettes.  She has a 7.50 pack-year smoking history. she has never used smokeless tobacco. She reports that she drinks alcohol. She reports that she uses drugs. Drug: Marijuana.   Family History:  The patient's family history includes CAD in her mother; Cirrhosis in her father; Lupus in her sister.    ROS   - negative   Wt Readings from Last 3 Encounters:  09/11/17 149 lb 1.9 oz (67.6 kg)  08/27/17 150 lb (68 kg)  06/18/17 150 lb (68 kg)     Physical Exam: Blood pressure (!) 122/58, pulse 85, height 5\' 5"  (1.651 m), weight 149 lb 1.9 oz (67.6 kg), last menstrual period 08/26/2017, SpO2 99 %.  GEN:  Well nourished, well developed in no acute distress HEENT: Normal NECK: No JVD; No carotid bruits LYMPHATICS: No lymphadenopathy CARDIAC: RR,   Soft systolic murmur,   Soft diastolic murmur  RESPIRATORY:  Clear to auscultation without rales, wheezing or rhonchi  ABDOMEN: Soft, non-tender, non-distended MUSCULOSKELETAL:  No edema; No deformity  SKIN: Warm and dry NEUROLOGIC:  Alert and oriented x 3     EKG:      Recent Labs: 01/09/2017: ALT 24 01/22/2017: TSH 0.717 03/27/2017: Magnesium 1.7 03/29/2017: B Natriuretic Peptide 134.8 08/28/2017: BUN 11; Creatinine, Ser 0.80; Hemoglobin 14.6; Platelets 224; Potassium 3.5; Sodium 141    Lipid Panel    Component Value Date/Time   CHOL 177  11/12/2007 2044   TRIG 159 (H) 11/12/2007 2044   HDL 44 11/12/2007 2044   CHOLHDL 4.0 Ratio 11/12/2007 2044   VLDL 32 11/12/2007 2044   LDLCALC 101 (H) 11/12/2007 2044       Other studies Reviewed: Additional studies/ records that were reviewed today include: .  Cardiac Cath 04/10/16 Conclusion     LV end diastolic pressure is normal.  There is moderate left ventricular systolic dysfunction.  The left ventricular ejection fraction is 35% by visual estimate.  There is no aortic valve stenosis.  LV end diastolic pressure is normal.   Nonischemic cardiomyopathy.  Continue medical therapy.   Indications   Acute combined systolic and diastolic heart failure (HCC) [I50.41 (ICD-10-CM)]  Dyspnea [R06.00 (ICD-10-CM)]   Hemo Data   Flowsheet Row Most Recent Value  Fick Cardiac Output 4.36 L/min  Fick Cardiac Output Index 2.46 (L/min)/BSA  RA A Wave 9 mmHg  RA V Wave 6 mmHg  RA Mean 4 mmHg  RV Systolic Pressure 28 mmHg  RV Diastolic Pressure 3 mmHg  RV EDP 6 mmHg  PA Systolic Pressure 28 mmHg  PA Diastolic Pressure 13 mmHg  PA Mean 20 mmHg  PW A Wave 10 mmHg  PW V Wave 9 mmHg  PW Mean 8 mmHg  AO Systolic Pressure 355 mmHg  AO Diastolic Pressure 77 mmHg  AO Mean 99 mmHg  LV Systolic Pressure 732 mmHg  LV Diastolic Pressure 7 mmHg  LV EDP 12 mmHg  Arterial Occlusion Pressure Extended Systolic Pressure 202 mmHg  Arterial Occlusion Pressure Extended Diastolic Pressure 76 mmHg  Arterial Occlusion Pressure Extended Mean Pressure 98 mmHg  Left Ventricular Apex Extended Systolic Pressure 542 mmHg  Left Ventricular Apex Extended Diastolic Pressure 8 mmHg  Left Ventricular Apex Extended EDP Pressure 9 mmHg  QP/QS 1  TPVR Index 8.13 HRUI  TSVR Index 40.21 HRUI  PVR SVR Ratio 0.13  TPVR/TSVR Ratio    ECHO: 04/08/16 Study Conclusions  - Left ventricle: The cavity size was mildly dilated. Wall   thickness was increased in a pattern of mild LVH. The estimated    ejection fraction was 35%. Diffuse hypokinesis. Features are   consistent with a pseudonormal left ventricular filling pattern,   with concomitant abnormal relaxation and increased filling   pressure (grade 2 diastolic dysfunction). - Aortic valve: Trileaflet; mildly calcified leaflets. There was no   stenosis. There was moderate regurgitation. There was no   holodiastolic flow reversal noted in the descending thoracic   aorta. - Mitral valve: There was moderate to severe regurgitation directed   centrally. - Left atrium: The atrium was mildly dilated. - Right ventricle: The cavity size was normal. Systolic function   was normal. - Pulmonary arteries: No complete TR doppler jet so unable to   estimate PA systolic pressure. - Systemic veins: IVC measured 1.7 cm with < 50% respirophasic   variation, suggesting RA pressure 8 mmHg.  Impressions:  - Mildly dilated LV with mild LV hypertrophy. EF 35%, diffuse   hypokinesis. Moderate diastolic dysfunction. Moderate aortic   insufficiency with trileaflet aortic valve. Moderate to severe   mitral regurgitation. MR was central, no definite leaflet   abnormalities noted.    ASSESSMENT AND PLAN:  1.  Acute combined systolic and diastolic heart failure ([place])-        Her LV function has improved.  We will add losartan 25 mg a day to her med list.  She was actually taking losartan 25 mg a day although it was not on her med list.  We had it listed as an allergy but clearly she is not allergic to losartan.  I think it will overall be beneficial for her.  She complains of frequent urination and excessive urination when taking Lasix.  It interferes with her work schedule.  We will decrease the Lasix to 20 mg a day and see how she does.  She will call us if she develops any volume overload.   2.  Hypertensive heart disease with heart  failure (HCC)  Stable  3.   NICM (nonischemic cardiomyopathy) ([place])     4. aortic insufficiency-    transesophageal echo revealed only mild AI.  We will continue to follow.  5.   Mitral Regurgitation-   transesophageal echo showed mild MR.  6.   Tobacco abuse.:  Advised smoking cessation.  7.   Normal coronary arteries    Mertie Moores, MD  09/11/2017 11:29 AM    [address],  [place], [place]  [id number] Pager [phone] Phone: [phone]; Fax: [phone]

## 2017-09-15 ENCOUNTER — Ambulatory Visit (INDEPENDENT_AMBULATORY_CARE_PROVIDER_SITE_OTHER): Payer: Self-pay | Admitting: Physician Assistant

## 2017-09-15 ENCOUNTER — Encounter (INDEPENDENT_AMBULATORY_CARE_PROVIDER_SITE_OTHER): Payer: Self-pay | Admitting: Physician Assistant

## 2017-09-15 ENCOUNTER — Other Ambulatory Visit: Payer: Self-pay

## 2017-09-15 VITALS — BP 116/73 | HR 79 | Temp 97.7°F | Wt 154.0 lb

## 2017-09-15 DIAGNOSIS — H547 Unspecified visual loss: Secondary | ICD-10-CM

## 2017-09-15 DIAGNOSIS — R350 Frequency of micturition: Secondary | ICD-10-CM

## 2017-09-15 DIAGNOSIS — R109 Unspecified abdominal pain: Secondary | ICD-10-CM

## 2017-09-15 DIAGNOSIS — T148XXA Other injury of unspecified body region, initial encounter: Secondary | ICD-10-CM

## 2017-09-15 DIAGNOSIS — I6529 Occlusion and stenosis of unspecified carotid artery: Secondary | ICD-10-CM

## 2017-09-15 DIAGNOSIS — N390 Urinary tract infection, site not specified: Secondary | ICD-10-CM

## 2017-09-15 DIAGNOSIS — K089 Disorder of teeth and supporting structures, unspecified: Secondary | ICD-10-CM

## 2017-09-15 DIAGNOSIS — G8929 Other chronic pain: Secondary | ICD-10-CM

## 2017-09-15 DIAGNOSIS — R8281 Pyuria: Secondary | ICD-10-CM

## 2017-09-15 DIAGNOSIS — I10 Essential (primary) hypertension: Secondary | ICD-10-CM

## 2017-09-15 LAB — POCT URINALYSIS DIPSTICK
BILIRUBIN UA: NEGATIVE
GLUCOSE UA: NEGATIVE
KETONES UA: NEGATIVE
Nitrite, UA: NEGATIVE
PH UA: 5 (ref 5.0–8.0)
Protein, UA: NEGATIVE
RBC UA: NEGATIVE
Spec Grav, UA: 1.01 (ref 1.010–1.025)
UROBILINOGEN UA: 0.2 U/dL

## 2017-09-15 LAB — POCT URINE PREGNANCY: Preg Test, Ur: NEGATIVE

## 2017-09-15 MED ORDER — CIPROFLOXACIN HCL 500 MG PO TABS: 500.0000 mg | ORAL_TABLET | Freq: Two times a day (BID) | ORAL | 0 refills | Status: AC

## 2017-09-15 MED ORDER — ATORVASTATIN CALCIUM 40 MG PO TABS: 40.0000 mg | ORAL_TABLET | Freq: Every day | ORAL | 3 refills | Status: DC

## 2017-09-15 MED ORDER — METHOCARBAMOL 500 MG PO TABS: 500.0000 mg | ORAL_TABLET | Freq: Two times a day (BID) | ORAL | 1 refills | Status: DC

## 2017-09-15 NOTE — Patient Instructions (Signed)
Muscle Strain A muscle strain (pulled muscle) happens when a muscle is stretched beyond normal length. It happens when a sudden, violent force stretches your muscle too far. Usually, a few of the fibers in your muscle are torn. Muscle strain is common in athletes. Recovery usually takes 1-2 weeks. Complete healing takes 5-6 weeks. Follow these instructions at home:  Follow the PRICE method of treatment to help your injury get better. Do this the first 2-3 days after the injury: ? Protect. Protect the muscle to keep it from getting injured again. ? Rest. Limit your activity and rest the injured body part. ? Ice. Put ice in a plastic bag. Place a towel between your skin and the bag. Then, apply the ice and leave it on from 15-20 minutes each hour. After the third day, switch to moist heat packs. ? Compression. Use a splint or elastic bandage on the injured area for comfort. Do not put it on too tightly. ? Elevate. Keep the injured body part above the level of your heart.  Only take medicine as told by your doctor.  Warm up before doing exercise to prevent future muscle strains. Contact a doctor if:  You have more pain or puffiness (swelling) in the injured area.  You feel numbness, tingling, or notice a loss of strength in the injured area. This information is not intended to replace advice given to you by your health care provider. Make sure you discuss any questions you have with your health care provider. Document Released: 04/08/2008 Document Revised: 12/06/2015 Document Reviewed: 01/27/2013 Elsevier Interactive Patient Education  2017 [employer] Inc[employer]  

## 2017-09-15 NOTE — Progress Notes (Signed)
Subjective:  Patient ID: Rachael Stone, female    DOB: 11/15/1965  Age: 52 y.o. MRN: [id number]  CC: back pain and spasm  HPI Rachael A Johnsonis a 52 y.o.femalewith a PMH of HTN, cardiomyopathy, aortic insufficiency, mitral regurgitation, ETOH abuse, tobacco abuse, and marijuana abuse presents with alternating left and right upper flank pain since last week. Pain was worse one week ago but there is still some moderate flank pain. Right flank pain at the moment. Endorses urinary frequency.    Went to the ED as directed from here for visual loss. CTA revealed carotid siphon atherosclerosis without occlusion. No episodes of visual loss since since discharge from ED. No other focal neurological deficits. No CP, palpitations, SOB, HA, rash, or GI sxs.   Outpatient Medications Prior to Visit  Medication Sig Dispense Refill  . amLODipine (NORVASC) 10 MG tablet Take 1 tablet (10 mg total) by mouth daily. 90 tablet 3  . carvedilol (COREG) 6.25 MG tablet Take 1 tablet (6.25 mg total) 2 (two) times daily by mouth. 180 tablet 3  . furosemide (LASIX) 20 MG tablet Take 1 tablet (20 mg total) by mouth daily. 90 tablet 3  . losartan (COZAAR) 25 MG tablet Take 25 mg by mouth daily.    . potassium chloride SA (K-DUR,KLOR-CON) 20 MEQ tablet Take 1 tablet (20 mEq total) by mouth 2 (two) times daily. 180 tablet 3   No facility-administered medications prior to visit.      ROS Review of Systems  Constitutional: Negative for chills, fever and malaise/fatigue.  HENT:       Dental pain  Eyes: Negative for blurred vision.  Respiratory: Negative for shortness of breath.   Cardiovascular: Negative for chest pain and palpitations.  Gastrointestinal: Positive for abdominal pain. Negative for nausea.  Genitourinary: Negative for dysuria and hematuria.  Musculoskeletal: Positive for back pain. Negative for joint pain and myalgias.  Skin: Negative for rash.  Neurological: Negative for tingling and  headaches.  Psychiatric/Behavioral: Negative for depression. The patient is not nervous/anxious.     Objective:  BP 116/73 (BP Location: Left Arm, Patient Position: Sitting, Cuff Size: Normal)   Pulse 79   Temp 97.7 F (36.5 C) (Oral)   Wt 154 lb (69.9 kg)   LMP 08/26/2017   SpO2 97%   BMI 25.63 kg/m   BP/Weight 09/15/2017 09/11/2017 7/67/3419  Systolic BP 379 024 097  Diastolic BP 73 58 80  Wt. (Lbs) 154 149.12 -  BMI 25.63 24.81 -      Physical Exam  Constitutional: She is oriented to person, place, and time.  Well developed, well nourished, NAD, polite  HENT:  Head: Normocephalic and atraumatic.  Eyes: No scleral icterus.  Neck: Normal range of motion. Neck supple. No thyromegaly present.  Cardiovascular: Normal rate, regular rhythm and normal heart sounds.  Pulmonary/Chest: Effort normal and breath sounds normal.  Abdominal: Soft. Bowel sounds are normal. There is no tenderness.  Mild right flank TTP  Genitourinary:  Genitourinary Comments: No CVA TTP bilaterally  Musculoskeletal: She exhibits no edema.  Neurological: She is alert and oriented to person, place, and time. No cranial nerve deficit. Coordination normal.  Skin: Skin is warm and dry. No rash noted. No erythema. No pallor.  Psychiatric: She has a normal mood and affect. Her behavior is normal. Thought content normal.  Vitals reviewed.    Assessment & Plan:   1. Flank pain - Urinalysis Dipstick - Urine Culture - POCT urine pregnancy  2. Urinary frequency -  Urinalysis Dipstick - Urine Culture  3. Pyuria - Urine Culture - ciprofloxacin (CIPRO) 500 MG tablet; Take 1 tablet (500 mg total) by mouth 2 (two) times daily for 3 days.  Dispense: 6 tablet; Refill: 0  4. Hypertension, unspecified type - Lipid panel; Future - atorvastatin (LIPITOR) 40 MG tablet; Take 1 tablet (40 mg total) by mouth daily.  Dispense: 90 tablet; Refill: 3 - Basic Metabolic Panel  5. Carotid artery calcification, unspecified  laterality - Lipid panel; Future - atorvastatin (LIPITOR) 40 MG tablet; Take 1 tablet (40 mg total) by mouth daily.  Dispense: 90 tablet; Refill: 3  6. Visual loss - Ambulatory referral to Ophthalmology  7. Muscle strain - methocarbamol (ROBAXIN) 500 MG tablet; Take 1 tablet (500 mg total) by mouth 2 (two) times daily.  Dispense: 14 tablet; Refill: 1 - Basic Metabolic Panel - Magnesium  8. Chronic dental pain - Ambulatory referral to Dentistry   Meds ordered this encounter  Medications  . atorvastatin (LIPITOR) 40 MG tablet    Sig: Take 1 tablet (40 mg total) by mouth daily.    Dispense:  90 tablet    Refill:  3    Order Specific Question:   Supervising Provider    Answer:   Tresa Garter [id number]  . ciprofloxacin (CIPRO) 500 MG tablet    Sig: Take 1 tablet (500 mg total) by mouth 2 (two) times daily for 3 days.    Dispense:  6 tablet    Refill:  0    Order Specific Question:   Supervising Provider    Answer:   Tresa Garter [id number]  . methocarbamol (ROBAXIN) 500 MG tablet    Sig: Take 1 tablet (500 mg total) by mouth 2 (two) times daily.    Dispense:  14 tablet    Refill:  1    Order Specific Question:   Supervising Provider    Answer:   Tresa Garter [id number]    Follow-up: Return in about 4 weeks (around 10/13/2017).   Clent Demark PA

## 2017-09-16 LAB — BASIC METABOLIC PANEL
BUN/Creatinine Ratio: 14 (ref 9–23)
BUN: 10 mg/dL (ref 6–24)
CALCIUM: 8.7 mg/dL (ref 8.7–10.2)
CO2: 23 mmol/L (ref 20–29)
Chloride: 105 mmol/L (ref 96–106)
Creatinine, Ser: 0.72 mg/dL (ref 0.57–1.00)
GFR calc Af Amer: 114 mL/min/{1.73_m2} (ref >59)
GFR, EST NON AFRICAN AMERICAN: 99 mL/min/{1.73_m2} (ref >59)
GLUCOSE: 92 mg/dL (ref 65–99)
POTASSIUM: 4 mmol/L (ref 3.5–5.2)
Sodium: 141 mmol/L (ref 134–144)

## 2017-09-16 LAB — MAGNESIUM: MAGNESIUM: 2.3 mg/dL (ref 1.6–2.3)

## 2017-09-17 ENCOUNTER — Telehealth (INDEPENDENT_AMBULATORY_CARE_PROVIDER_SITE_OTHER): Payer: Self-pay

## 2017-09-17 LAB — URINE CULTURE

## 2017-09-17 NOTE — Telephone Encounter (Signed)
Spoke with patients fiance and informed of normal lab results. He will inform patient. Nat Christen, CMA

## 2017-09-17 NOTE — Telephone Encounter (Signed)
-----   Message from Clent Demark, PA-C sent at 09/16/2017  5:57 PM EST ----- Normal labs.

## 2017-09-18 ENCOUNTER — Ambulatory Visit: Payer: Self-pay | Attending: Physician Assistant | Admitting: *Deleted

## 2017-09-18 VITALS — BP 125/71 | HR 94 | Temp 98.1°F | Resp 18

## 2017-09-18 DIAGNOSIS — Z23 Encounter for immunization: Secondary | ICD-10-CM

## 2017-09-18 NOTE — Patient Instructions (Signed)
Patient is aware of FU as needed

## 2017-09-30 MED FILL — ATORVASTATIN 40 MG TABLET: 40 | 30 days supply | Qty: 30 | Fill #0

## 2017-09-30 MED FILL — METHOCARBAMOL 500 MG TABLET: 500 | 7 days supply | Qty: 14 | Fill #0

## 2017-09-30 MED FILL — ?FUROSEMIDE 20 MG TABLET: 20 | 30 days supply | Qty: 30 | Fill #0

## 2017-09-30 MED FILL — CIPROFLOXACIN HCL 500 MG TA: 500 | 3 days supply | Qty: 6 | Fill #0

## 2017-10-02 MED FILL — POTASSIUM CL ER 20 MEQ TAB: 20 | 30 days supply | Qty: 30 | Fill #2

## 2017-10-02 MED FILL — AMLODIPINE BESYLATE 10 MG T: 10 | 30 days supply | Qty: 30 | Fill #3

## 2017-10-06 ENCOUNTER — Other Ambulatory Visit: Payer: Self-pay | Admitting: *Deleted

## 2017-10-06 DIAGNOSIS — I351 Nonrheumatic aortic (valve) insufficiency: Secondary | ICD-10-CM

## 2017-10-06 DIAGNOSIS — I5022 Chronic systolic (congestive) heart failure: Secondary | ICD-10-CM

## 2017-10-06 DIAGNOSIS — I340 Nonrheumatic mitral (valve) insufficiency: Secondary | ICD-10-CM

## 2017-10-07 LAB — BASIC METABOLIC PANEL
BUN/Creatinine Ratio: 9 (ref 9–23)
BUN: 8 mg/dL (ref 6–24)
CO2: 20 mmol/L (ref 20–29)
CREATININE: 0.87 mg/dL (ref 0.57–1.00)
Calcium: 8.5 mg/dL — ABNORMAL LOW (ref 8.7–10.2)
Chloride: 111 mmol/L — ABNORMAL HIGH (ref 96–106)
GFR calc Af Amer: 90 mL/min/{1.73_m2} (ref >59)
GFR, EST NON AFRICAN AMERICAN: 78 mL/min/{1.73_m2} (ref >59)
Glucose: 83 mg/dL (ref 65–99)
Potassium: 4.1 mmol/L (ref 3.5–5.2)
SODIUM: 144 mmol/L (ref 134–144)

## 2017-10-09 ENCOUNTER — Telehealth: Payer: Self-pay | Admitting: Cardiovascular Disease

## 2017-10-09 NOTE — Telephone Encounter (Signed)
Its not likely to cause shortness of breath. Please make sure she does not have lip or throat swelling  In the long run, we would expect this to help her heart function

## 2017-10-09 NOTE — Telephone Encounter (Addendum)
Pt feels the losartan is causing SOB. Has been on previous and  pt thinks and did not tolerate.Pt will  hold med and call next week with update and also keep a B/p log.Will forward to Dr Acie Fredrickson for review .Adonis Housekeeper

## 2017-10-09 NOTE — Telephone Encounter (Signed)
Her shortness of breath is likely due to the decreased dose of lasix and not the Losartan . I called and left a message to add additional lasix over the weekend if she develops shortness of breath

## 2017-10-09 NOTE — Telephone Encounter (Signed)
PER PT NO LIP OR THROAT SWELLING .PT AWARE WILL HOLD LOSARTAN AND CALL NEXT WEEK WITH UPDATE ALSO WILL KEEP B/P LOG AND MONITOR WEIGHT AND CALL WITH THOSE READINGS. ALSO  FUROSEMIDE  WAS DECREASED AT LAST OFFICE VISIT /CY

## 2017-10-09 NOTE — Telephone Encounter (Signed)
New Message  Pt c/o medication issue:  1. Name of Medication: n/a  2. How are you currently taking this medication (dosage and times per day)? n/a  3. Are you having a reaction (difficulty breathing--STAT)? SOB  4. What is your medication issue? Pt states that one of the medications she is taking is giving her SOB but not sure which one. Pt says its a medication that she stopped previously for the same issue. Please call

## 2017-10-12 NOTE — Telephone Encounter (Signed)
Called patient to discuss medication management per request from Dr. Acie Fredrickson. He advised patient restart Losartan 25 mg daily and take Lasix 20 mg daily if she has not been doing so. The patient states she did not take Lasix during the weekend and had reduced Losartan to 12.5 mg (1/2 of 25 mg tablet). I advised per Dr. Acie Fredrickson, resume Lasix 20 mg daily and Losartan 25 mg daily and call back later this week to let us know if her SOB does not improve. 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 I advised her to call sooner if needed and she thanked me for the call.

## 2017-11-19 ENCOUNTER — Other Ambulatory Visit: Payer: Self-pay

## 2017-11-19 DIAGNOSIS — I6529 Occlusion and stenosis of unspecified carotid artery: Secondary | ICD-10-CM

## 2017-11-19 DIAGNOSIS — I10 Essential (primary) hypertension: Secondary | ICD-10-CM

## 2017-11-19 MED ORDER — POTASSIUM CHLORIDE CRYS ER 20 MEQ PO TBCR: 20.0000 meq | EXTENDED_RELEASE_TABLET | Freq: Two times a day (BID) | ORAL | 2 refills | Status: DC

## 2017-11-19 MED ORDER — AMLODIPINE BESYLATE 10 MG PO TABS: 10.0000 mg | ORAL_TABLET | Freq: Every day | ORAL | 2 refills | Status: DC

## 2017-11-19 MED ORDER — ATORVASTATIN CALCIUM 40 MG PO TABS: 40.0000 mg | ORAL_TABLET | Freq: Every day | ORAL | 2 refills | Status: DC

## 2017-11-19 MED ORDER — FUROSEMIDE 20 MG PO TABS: 20.0000 mg | ORAL_TABLET | Freq: Every day | ORAL | 2 refills | Status: DC

## 2017-11-19 MED ORDER — CARVEDILOL 6.25 MG PO TABS: 6.2500 mg | ORAL_TABLET | Freq: Two times a day (BID) | ORAL | 2 refills | Status: DC

## 2017-11-19 MED FILL — POTASSIUM CL ER 20 MEQ TAB: 20 | 30 days supply | Qty: 60 | Fill #0

## 2017-11-19 MED FILL — ?CARVEDILOL 6.25 MG TABLET: 6.25 | 30 days supply | Qty: 60 | Fill #1

## 2017-11-19 MED FILL — ATORVASTATIN CALCIUM 40 MG: 40 | 30 days supply | Qty: 30 | Fill #1

## 2017-11-19 MED FILL — AMLODIPINE BESYLATE 10 MG T: 10 | 30 days supply | Qty: 30 | Fill #0

## 2017-11-19 MED FILL — ?FUROSEMIDE 20 MG TABLET: 20 | 30 days supply | Qty: 30 | Fill #1

## 2017-11-27 ENCOUNTER — Encounter: Payer: Self-pay | Admitting: Cardiology

## 2017-12-21 ENCOUNTER — Encounter (INDEPENDENT_AMBULATORY_CARE_PROVIDER_SITE_OTHER): Payer: Self-pay

## 2017-12-21 ENCOUNTER — Encounter: Payer: Self-pay | Admitting: Cardiology

## 2017-12-21 ENCOUNTER — Ambulatory Visit (INDEPENDENT_AMBULATORY_CARE_PROVIDER_SITE_OTHER): Payer: Self-pay | Admitting: Cardiology

## 2017-12-21 VITALS — BP 128/62 | HR 85 | Ht 65.0 in | Wt 159.0 lb

## 2017-12-21 DIAGNOSIS — Z79899 Other long term (current) drug therapy: Secondary | ICD-10-CM

## 2017-12-21 DIAGNOSIS — I10 Essential (primary) hypertension: Secondary | ICD-10-CM

## 2017-12-21 DIAGNOSIS — I5042 Chronic combined systolic (congestive) and diastolic (congestive) heart failure: Secondary | ICD-10-CM

## 2017-12-21 DIAGNOSIS — I340 Nonrheumatic mitral (valve) insufficiency: Secondary | ICD-10-CM

## 2017-12-21 DIAGNOSIS — I351 Nonrheumatic aortic (valve) insufficiency: Secondary | ICD-10-CM

## 2017-12-21 NOTE — Patient Instructions (Addendum)
Medication Instructions:   Your physician recommends that you continue on your current medications as directed. Please refer to the Current Medication list given to you today.   If you need a refill on your cardiac medications before your next appointment, please call your pharmacy.  Labwork:  BMET TODAY     Testing/Procedures:  NONE ORDERED  TODAY   Follow-Up:   WITH [facility] IN 2 TO 3 MONTHS ( CANCEL CURRENT APPT)   Any Other Special Instructions Will Be Listed Below (If Applicable).

## 2017-12-21 NOTE — Progress Notes (Signed)
Cardiology Office Note   Date:  12/21/2017   ID:  [name], DOB 1966/04/18, MRN [id number]  PCP:  Clent Demark, PA-C  Cardiologist:  Dr. Acie Fredrickson    Chief Complaint  Patient presents with  . Shortness of Breath      History of Present Illness: Rachael Stone is a 52 y.o. female who presents for NICM.  Pt with hx of LV dysfunction with EF 35%, mod AI and mod to severe MR with hospitalization  for dyspnea rt and Lt cardiac cath done 2017. Other hx of tob. ETOH and marijuana use, remote cocaine use.   She had CTA of chest without PE + pulmonary edema and LUL nodule of 59mm  Cath in 2017 with patent coronary arteries Echo 06/09/17 with EF 55-60%, G @ DD, AR mod to severe, mild MR so TEE was arranged.  TEE 06/26/17 EF 55-60% moderate aortic regurg, mild MR,   She last saw Dr. Acie Fredrickson 09/11/17 and was improved. She stopped salt and was decreasing tobacco.  She has problems with understanding why she needs her meds.  Losartan was added, not allergic.  Lasix decreased to 20 mg daily due to freq urination.  With this she became more SOB so lasix was increased to 40 mg daily, she takes 2 of 20 mg tab.    Today she is feeling well. No further SOB. No chest pain. .  No lightheadedness or dizziness.  She is still cutting out salt.  She is smoking but < half a pk per day.  She is not exercising but we discussed walking 5-10 min in early morning when cool.  She does work 25 hours per week.     Past Medical History:  Diagnosis Date  . Back pain   . Cardiomyopathy ([place])    a. 04/08/2016 Echo: EF 35%, diff HK, mild LVH, Gr2 DD, mod AI, mod to sev MR, mildly dil LA.  [name] ETOH abuse   . Hypertensive heart disease with heart failure ([place])   . Marijuana abuse   . Moderate aortic insufficiency    a. 04/08/2016 Echo: mod AI.  [name] Moderate to Severe Mitral Regurgitation    a. 04/08/2016 Echo: mod-sev MR directed centrally.  . Tobacco abuse     Past Surgical History:  Procedure Laterality  Date  . CARDIAC CATHETERIZATION N/A 04/10/2016   Procedure: Right/Left Heart Cath and Coronary Angiography;  Surgeon: [name], MD;  Location: [facility];  Service: Cardiovascular;  Laterality: N/A;  . EXPLORATORY LAPAROTOMY  2003   Ectopic Pregnancy - unsure which side or if tube was removed  . TEE WITHOUT CARDIOVERSION N/A 06/26/2017   Procedure: TRANSESOPHAGEAL ECHOCARDIOGRAM (TEE);  Surgeon: [name], MD;  Location[facility] END[facility];  Service: Cardiovascular;  Laterality: N/A;  . TUBAL LIGATION       Current Outpatient Medications  Medication Sig Dispense Refill  . amLODipine (NORVASC) 10 MG tablet Take 1 tablet (10 mg total) by mouth daily. 90 tablet 2  . atorvastatin (LIPITOR) 40 MG tablet Take 1 tablet (40 mg total) by mouth daily. 90 tablet 2  . carvedilol (COREG) 6.25 MG tablet Take 1 tablet (6.25 mg total) by mouth 2 (two) times daily. 180 tablet 2  . furosemide (LASIX) 20 MG tablet Take 1 tablet (20 mg total) by mouth daily. (Patient taking differently: Take 40 mg by mouth daily. ) 90 tablet 2  . losartan (COZAAR) 25 MG tablet Take 25 mg by mouth daily.    [name]  Potassium Chloride ER 20 MEQ TBCR Take 1 tablet by mouth 2 (two) times daily.  2   No current facility-administered medications for this visit.     Allergies:   Ibuprofen    Social History:  The patient  reports that she has been smoking cigarettes.  She has a 7.50 pack-year smoking history. She has never used smokeless tobacco. She reports that she drinks alcohol. She reports that she has current or past drug history. Drug: Marijuana.   Family History:  The patient's family history includes CAD in her mother; Cirrhosis in her father; Lupus in her sister.    ROS:  General:no colds or fevers, no weight changes wt is up from 09/11/17 but lungs clear Skin:no rashes or ulcers HEENT:no blurred vision, no congestion CV:see HPI PUL:see HPI GI:no diarrhea constipation or melena, no indigestion GU:no  hematuria, no dysuria MS:no joint pain, no claudication Neuro:no syncope, no lightheadedness Endo:no diabetes, no thyroid disease  Wt Readings from Last 3 Encounters:  12/21/17 159 lb (72.1 kg)  09/15/17 154 lb (69.9 kg)  09/11/17 149 lb 1.9 oz (67.6 kg)     PHYSICAL EXAM: VS:  BP 128/62   Pulse 85   Ht 5\' 5"  (1.651 m)   Wt 159 lb (72.1 kg)   SpO2 98%   BMI 26.46 kg/m  , BMI Body mass index is 26.46 kg/m. General:Pleasant affect, NAD Skin:Warm and dry, brisk capillary refill HEENT:normocephalic, sclera clear, mucus membranes moist Neck:supple, no JVD, no bruits  Heart:S1S2 RRR with soft sytolic murmur, no gallup, rub or click Lungs:clear without rales, rhonchi, or wheezes EGB:TDVV, non tender, + BS, do not palpate liver spleen or masses Ext:no lower ext edema, 2+ pedal pulses, 2+ radial pulses Neuro:alert and oriented X 3, MAE, follows commands, + facial symmetry    EKG:  EKG is NOT ordered today.    Recent Labs: 01/09/2017: ALT 24 01/22/2017: TSH 0.717 03/29/2017: B Natriuretic Peptide 134.8 08/28/2017: Hemoglobin 14.6; Platelets 224 09/15/2017: Magnesium 2.3 10/06/2017: BUN 8; Creatinine, Ser 0.87; Potassium 4.1; Sodium 144    Lipid Panel    Component Value Date/Time   CHOL 177 11/12/2007 2044   TRIG 159 (H) 11/12/2007 2044   HDL 44 11/12/2007 2044   CHOLHDL 4.0 Ratio 11/12/2007 2044   VLDL 32 11/12/2007 2044   LDLCALC 101 (H) 11/12/2007 2044       Other studies Reviewed: Additional studies/ records that were reviewed today include: . TEE 06/26/17 Study Conclusions  - Left ventricle: Systolic function was normal. The estimated   ejection fraction was in the range of 55% to 60%. - Aortic valve: There was moderate regurgitation. - Mitral valve: There was mild regurgitation. - Left atrium: No evidence of thrombus in the atrial cavity or   appendage. - Atrial septum: No defect or patent foramen ovale was identified. - Tricuspid valve: No evidence of  vegetation.   ASSESSMENT AND PLAN:  1.  Chronic systolic and diastolic HF, stable with 40 of lasix daily, with losartan added no complaints. Will check BMP today.  Would hold off on increasing losartan as pt does not like to take meds, though will check  With Dr. Acie Fredrickson, he may wish to increase.  She had appt with Dr. Acie Fredrickson for next week will cancel and have in seen in 2-3 months.  2.   HTN stable  3.   NICM now with stable EF. Continue current meds.  4.   AI, mild will follow  5.   MR mild will  follow  6.   Tobacco use - continues to try and and decrease, now <half a pk per day.   7.   Normal coronary arteries in 2017.    Current medicines are reviewed with the patient today.  The patient Has no concerns regarding medicines.  The following changes have been made:  See above Labs/ tests ordered today include:see above  Disposition:   FU:  see above  Signed, [name], NP  12/21/2017 1:47 PM    [facility] [address], [place], [address] [place], [place] Phone: [phone]; Fax: [phone]

## 2017-12-22 LAB — BASIC METABOLIC PANEL
BUN/Creatinine Ratio: 10 (ref 9–23)
BUN: 10 mg/dL (ref 6–24)
CO2: 20 mmol/L (ref 20–29)
CREATININE: 0.98 mg/dL (ref 0.57–1.00)
Calcium: 8.7 mg/dL (ref 8.7–10.2)
Chloride: 107 mmol/L — ABNORMAL HIGH (ref 96–106)
GFR calc Af Amer: 78 mL/min/{1.73_m2} (ref >59)
GFR, EST NON AFRICAN AMERICAN: 68 mL/min/{1.73_m2} (ref >59)
Glucose: 84 mg/dL (ref 65–99)
Potassium: 4.1 mmol/L (ref 3.5–5.2)
SODIUM: 140 mmol/L (ref 134–144)

## 2017-12-22 NOTE — Progress Notes (Signed)
Pt has been made aware of normal result and verbalized understanding.  jw 12/22/17

## 2017-12-29 ENCOUNTER — Ambulatory Visit: Payer: Medicaid Other | Admitting: Cardiovascular Disease

## 2018-01-21 ENCOUNTER — Emergency Department (HOSPITAL_COMMUNITY): Payer: Medicaid Other

## 2018-01-21 ENCOUNTER — Emergency Department (HOSPITAL_COMMUNITY)
Admission: EM | Admit: 2018-01-21 | Discharge: 2018-01-21 | Disposition: A | Discharge status: Home / Self Care | Payer: Medicaid Other | Attending: Emergency Medicine | Admitting: Emergency Medicine

## 2018-01-21 ENCOUNTER — Encounter (HOSPITAL_COMMUNITY): Payer: Self-pay | Admitting: Obstetrics and Gynecology

## 2018-01-21 DIAGNOSIS — R062 Wheezing: Secondary | ICD-10-CM | POA: Insufficient documentation

## 2018-01-21 DIAGNOSIS — I509 Heart failure, unspecified: Secondary | ICD-10-CM | POA: Insufficient documentation

## 2018-01-21 DIAGNOSIS — R059 Cough, unspecified: Secondary | ICD-10-CM

## 2018-01-21 DIAGNOSIS — F17210 Nicotine dependence, cigarettes, uncomplicated: Secondary | ICD-10-CM | POA: Insufficient documentation

## 2018-01-21 DIAGNOSIS — R0789 Other chest pain: Secondary | ICD-10-CM | POA: Insufficient documentation

## 2018-01-21 DIAGNOSIS — F1290 Cannabis use, unspecified, uncomplicated: Secondary | ICD-10-CM | POA: Insufficient documentation

## 2018-01-21 DIAGNOSIS — I10 Essential (primary) hypertension: Secondary | ICD-10-CM | POA: Insufficient documentation

## 2018-01-21 DIAGNOSIS — R05 Cough: Secondary | ICD-10-CM

## 2018-01-21 DIAGNOSIS — Z79899 Other long term (current) drug therapy: Secondary | ICD-10-CM | POA: Insufficient documentation

## 2018-01-21 LAB — BASIC METABOLIC PANEL WITH GFR
Anion gap: 9 (ref 5–15)
BUN: 7 mg/dL (ref 6–20)
CO2: 25 mmol/L (ref 22–32)
Calcium: 9.1 mg/dL (ref 8.9–10.3)
Chloride: 106 mmol/L (ref 98–111)
Creatinine, Ser: 0.84 mg/dL (ref 0.44–1.00)
GFR calc Af Amer: >60 mL/min
GFR calc non Af Amer: >60 mL/min
Glucose, Bld: 110 mg/dL — ABNORMAL HIGH (ref 70–99)
Potassium: 3.7 mmol/L (ref 3.5–5.1)
Sodium: 140 mmol/L (ref 135–145)

## 2018-01-21 LAB — I-STAT TROPONIN, ED: Troponin i, poc: 0 ng/mL (ref 0.00–0.08)

## 2018-01-21 LAB — I-STAT BETA HCG BLOOD, ED (MC, WL, AP ONLY): I-stat hCG, quantitative: <5 m[IU]/mL (ref <5)

## 2018-01-21 LAB — CBC
HCT: 42.1 % (ref 36.0–46.0)
Hemoglobin: 14.1 g/dL (ref 12.0–15.0)
MCH: 29 pg (ref 26.0–34.0)
MCHC: 33.5 g/dL (ref 30.0–36.0)
MCV: 86.4 fL (ref 78.0–100.0)
Platelets: 263 K/uL (ref 150–400)
RBC: 4.87 MIL/uL (ref 3.87–5.11)
RDW: 14.1 % (ref 11.5–15.5)
WBC: 9.4 K/uL (ref 4.0–10.5)

## 2018-01-21 LAB — BRAIN NATRIURETIC PEPTIDE: B Natriuretic Peptide: 25.2 pg/mL (ref 0.0–100.0)

## 2018-01-21 MED ORDER — ALBUTEROL SULFATE HFA 108 (90 BASE) MCG/ACT IN AERS
2.0000 | INHALATION_SPRAY | Freq: Once | RESPIRATORY_TRACT | Status: AC
  Administered 2018-01-21: 2 via RESPIRATORY_TRACT
  Filled 2018-01-21: qty 6.7

## 2018-01-21 MED ORDER — IPRATROPIUM-ALBUTEROL 0.5-2.5 (3) MG/3ML IN SOLN
3.0000 mL | Freq: Once | RESPIRATORY_TRACT | Status: AC
  Administered 2018-01-21: 3 mL via RESPIRATORY_TRACT
  Filled 2018-01-21: qty 3

## 2018-01-21 MED ORDER — DOXYCYCLINE HYCLATE 100 MG PO CAPS: 100.0000 mg | ORAL_CAPSULE | Freq: Two times a day (BID) | ORAL | 0 refills | Status: AC

## 2018-01-21 MED ORDER — PREDNISONE 20 MG PO TABS
60.0000 mg | ORAL_TABLET | Freq: Once | ORAL | Status: AC
  Administered 2018-01-21: 60 mg via ORAL
  Filled 2018-01-21: qty 3

## 2018-01-21 MED ORDER — PREDNISONE 10 MG PO TABS: 20.0000 mg | ORAL_TABLET | Freq: Two times a day (BID) | ORAL | 0 refills | Status: AC

## 2018-01-21 NOTE — ED Provider Notes (Signed)
[facility] DEPT Provider Note   CSN: [id number] Arrival date & time: 01/21/18  2017     History   Chief Complaint Chief Complaint  Patient presents with  . Shortness of Breath  . Chest Pain    HPI Rachael Stone is a 52 y.o. female.  HPI   Rachael Stone is a 52 y.o. female, with a history of HTN and heart failure, presenting to the ED with productive cough and shortness of breath for the last week.  She has chest soreness with coughing.  She has not tried any therapies for her complaints.  Her symptoms initially started with sore throat, but this has since resolved. Denies exertional chest pain, diaphoresis, dizziness, syncope, fever/chills, N/V/D, peripheral edema, or any other complaints.      Past Medical History:  Diagnosis Date  . Back pain   . Cardiomyopathy ([place])    a. 04/08/2016 Echo: EF 35%, diff HK, mild LVH, Gr2 DD, mod AI, mod to sev MR, mildly dil LA.  [name] ETOH abuse   . Hypertensive heart disease with heart failure ([place])   . Marijuana abuse   . Moderate aortic insufficiency    a. 04/08/2016 Echo: mod AI.  [name] Moderate to Severe Mitral Regurgitation    a. 04/08/2016 Echo: mod-sev MR directed centrally.  . Tobacco abuse     Patient Active Problem List   Diagnosis Date Noted  . BV (bacterial vaginosis) 01/26/2017  . Trichomonal infection 01/26/2017  . Essential hypertension 11/14/2016  . Normal coronary arteries 04/11/2016  . Hypertensive heart disease with heart failure ([place])   . NICM (nonischemic cardiomyopathy) ([place])   . Nonrheumatic aortic valve insufficiency   . Moderate to Severe Mitral Regurgitation   . Tobacco abuse   . Acute pulmonary edema ([place]) 04/08/2016  . Elevated blood pressure 04/08/2016  . Dyspnea   . Acute combined systolic and diastolic heart failure ([place]) 04/07/2016  . VAGINAL DISCHARGE 09/10/2009  . CHEST PAIN UNSPECIFIED 08/13/2009  . DEPRESSIVE DISORDER NOT ELSEWHERE CLASSIFIED 12/14/2008  .  MENORRHAGIA 12/14/2008  . NAUSEA, CHRONIC 12/14/2008  . ELEVATED BLOOD PRESSURE WITHOUT DIAGNOSIS OF HYPERTENSION 12/14/2008  . DYSFUNCTIONAL UTERINE BLEEDING 11/12/2007  . TOBACCO DEPENDENCE 09/10/2006  . GASTROESOPHAGEAL REFLUX, NO ESOPHAGITIS 09/10/2006    Past Surgical History:  Procedure Laterality Date  . CARDIAC CATHETERIZATION N/A 04/10/2016   Procedure: Right/Left Heart Cath and Coronary Angiography;  Surgeon: [name], MD;  Location: [facility];  Service: Cardiovascular;  Laterality: N/A;  . EXPLORATORY LAPAROTOMY  2003   Ectopic Pregnancy - unsure which side or if tube was removed  . TEE WITHOUT CARDIOVERSION N/A 06/26/2017   Procedure: TRANSESOPHAGEAL ECHOCARDIOGRAM (TEE);  Surgeon: Acie Fredrickson, Wonda Cheng, MD;  Location: [facility] ENDOSCOPY;  Service: Cardiovascular;  Laterality: N/A;  . TUBAL LIGATION       OB History    Gravida  3   Para  2   Term  0   Preterm  0   AB  1   Living  2     SAB  0   TAB  0   Ectopic  1   Multiple  0   Live Births               Home Medications    Prior to Admission medications   Medication Sig Start Date End Date Taking? Authorizing Provider  amLODipine (NORVASC) 10 MG tablet Take 1 tablet (10 mg total) by mouth daily. 11/19/17  Yes  Nahser, Wonda Cheng, MD  carvedilol (COREG) 6.25 MG tablet Take 1 tablet (6.25 mg total) by mouth 2 (two) times daily. Patient taking differently: Take 6.25 mg by mouth daily.  11/19/17  Yes Nahser, Wonda Cheng, MD  furosemide (LASIX) 20 MG tablet Take 1 tablet (20 mg total) by mouth daily. 11/19/17 02/17/18 Yes Nahser, Wonda Cheng, MD  losartan (COZAAR) 25 MG tablet Take 25 mg by mouth daily.   Yes [provider]  Potassium Chloride ER 20 MEQ TBCR Take 1 tablet by mouth 2 (two) times daily. 11/19/17  Yes [provider]  atorvastatin (LIPITOR) 40 MG tablet Take 1 tablet (40 mg total) by mouth daily. Patient not taking: Reported on 01/21/2018 11/19/17   Nahser, Wonda Cheng, MD    doxycycline (VIBRAMYCIN) 100 MG capsule Take 1 capsule (100 mg total) by mouth 2 (two) times daily for 5 days. 01/21/18 01/26/18  Syria Kestner C, PA-C  predniSONE (DELTASONE) 10 MG tablet Take 2 tablets (20 mg total) by mouth 2 (two) times daily with a meal for 4 days. 01/21/18 01/25/18  Lorayne Bender, PA-C    Family History Family History  Problem Relation Age of Onset  . CAD Mother        First MI @ 58 - 42 total.  . Cirrhosis Father        alcoholic - died in his 36'R.  . Lupus Sister     Social History Social History   Tobacco Use  . Smoking status: Current Every Day Smoker    Packs/day: 0.50    Years: 15.00    Pack years: 7.50    Types: Cigarettes  . Smokeless tobacco: Never Used  Substance Use Topics  . Alcohol use: Yes    Comment: At least 12 ounces of homemade moon shine every other day.  . Drug use: Yes    Types: Marijuana    Comment: Smokes marijuana daily.  Used to smoke cocaine laced  marijuana cigarettes for 15-20 yrs but quit cocaine 6 yrs ago.     Allergies   Ibuprofen   Review of Systems Review of Systems  Constitutional: Negative for chills, diaphoresis and fever.  Respiratory: Positive for cough and shortness of breath.   Cardiovascular: Negative for chest pain.  Gastrointestinal: Negative for abdominal pain, diarrhea, nausea and vomiting.  Musculoskeletal:       Chest soreness with coughing  Neurological: Negative for dizziness, syncope and light-headedness.  All other systems reviewed and are negative.    Physical Exam Updated Vital Signs BP (!) 156/84 (BP Location: Right Arm)   Pulse 82   Temp 98 F (36.7 C) (Oral)   Resp (!) 22   Ht 5' 5.5" (1.664 m)   Wt 73 kg (161 lb)   SpO2 96%   BMI 26.38 kg/m   Physical Exam  Constitutional: She appears well-developed and well-nourished. No distress.  HENT:  Head: Normocephalic and atraumatic.  Eyes: Conjunctivae are normal.  Neck: Neck supple.  Cardiovascular: Normal rate, regular rhythm,  normal heart sounds and intact distal pulses.  Pulmonary/Chest: Effort normal. No respiratory distress. She has wheezes in the right lower field and the left lower field.  No increased work of breathing.  Speaks in full sentences without difficulty.  Some coughing during interview.  Abdominal: Soft. There is no tenderness. There is no guarding.  Musculoskeletal: She exhibits no edema.  Lymphadenopathy:    She has no cervical adenopathy.  Neurological: She is alert.  Skin: Skin is warm and  dry. She is not diaphoretic.  Psychiatric: She has a normal mood and affect. Her behavior is normal.  Nursing note and vitals reviewed.    ED Treatments / Results  Labs (all labs ordered are listed, but only abnormal results are displayed) Labs Reviewed  BASIC METABOLIC PANEL - Abnormal; Notable for the following components:      Result Value   Glucose, Bld 110 (*)    All other components within normal limits  CBC  BRAIN NATRIURETIC PEPTIDE  I-STAT TROPONIN, ED  I-STAT BETA HCG BLOOD, ED ([facility], [facility], [facility] ONLY)    EKG EKG Interpretation  Date/Time:  Thursday January 21 2018 20:23:47 EDT Ventricular Rate:  90 PR Interval:    QRS Duration: 87 QT Interval:  363 QTC Calculation: 445 R Axis:   12 Text Interpretation:  Sinus rhythm Borderline T abnormalities, inferior leads No significant change since last tracing Confirmed by Duffy Bruce [phone]) on 01/21/2018 10:19:54 PM   Radiology Dg Chest 2 View  Result Date: 01/21/2018 CLINICAL DATA:  Cough and dyspnea for over a week. EXAM: CHEST - 2 VIEW COMPARISON:  03/29/2017 FINDINGS: Heart and mediastinal contours are stable and within normal limits. No alveolar consolidation, CHF, effusion or pneumothorax. No acute osseous abnormality. IMPRESSION: No active cardiopulmonary disease. Electronically Signed   By: Ashley Royalty M.D.   On: 01/21/2018 21:15    Procedures Procedures (including critical care time)  Medications Ordered in ED Medications    predniSONE (DELTASONE) tablet 60 mg (has no administration in time range)  albuterol (PROVENTIL HFA;VENTOLIN HFA) 108 (90 Base) MCG/ACT inhaler 2 puff (has no administration in time range)  ipratropium-albuterol (DUONEB) 0.5-2.5 (3) MG/3ML nebulizer solution 3 mL (3 mLs Nebulization Given 01/21/18 2224)     Initial Impression / Assessment and Plan / ED Course  I have reviewed the triage vital signs and the nursing notes.  Pertinent labs & imaging results that were available during my care of the patient were reviewed by me and considered in my medical decision making (see chart for details).     Patient presents with cough and remittent shortness of breath.  Clinical picture supports infectious cause, such as bronchitis.  No acute abnormality noted on chest x-ray. EKG similar to previous. PERC negative. No peripheral edema, elevated BNP, or rales on exam. The patient was given instructions for home care as well as return precautions. Patient voices understanding of these instructions, accepts the plan, and is comfortable with discharge.  Findings and plan of care discussed with Duffy Bruce, MD. Dr. Ellender Hose personally evaluated and examined this patient.   Conclusion for cardiac cath performed September 8921:  LV end diastolic pressure is normal.  There is moderate left ventricular systolic dysfunction.  The left ventricular ejection fraction is 35% by visual estimate.  There is no aortic valve stenosis.  LV end diastolic pressure is normal.   Nonischemic cardiomyopathy.  Continue medical therapy.  Final Clinical Impressions(s) / ED Diagnoses   Final diagnoses:  Cough    ED Discharge Orders        Ordered    doxycycline (VIBRAMYCIN) 100 MG capsule  2 times daily     01/21/18 2308    predniSONE (DELTASONE) 10 MG tablet  2 times daily with meals     01/21/18 2308       Lorayne Bender, PA-C 01/21/18 2308    Duffy Bruce, MD 01/22/18 1106

## 2018-01-21 NOTE — Discharge Instructions (Addendum)
Your symptoms are likely consistent with a viral illness, however, we will cover you with antibiotics in case there is a bacterial cause.  It is important to note, however, he is viruses do not require or respond to antibiotics. Treatment is symptomatic care and it is important to note that these symptoms may last for 7-14 days.   Hand washing: Wash your hands throughout the day, but especially before and after touching the face, using the restroom, sneezing, coughing, or touching surfaces that have been coughed or sneezed upon. Hydration: Symptoms will be intensified and complicated by dehydration. Dehydration can also extend the duration of symptoms. Drink plenty of fluids and get plenty of rest. You should be drinking at least half a liter of water an hour to stay hydrated. Electrolyte drinks (ex. Gatorade, Powerade, Pedialyte) are also encouraged. You should be drinking enough fluids to make your urine light yellow, almost clear. If this is not the case, you are not drinking enough water. Please note that some of the treatments indicated below will not be effective if you are not adequately hydrated. Pain or fever: Tylenol for pain or fever.  Albuterol: May use the albuterol as needed for instances of shortness of breath. Prednisone: Take the prednisone, as directed, in its entirety. Zyrtec or Claritin: May add these medication daily to control underlying symptoms of congestion, sneezing, and other signs of allergies.  These medications are available over-the-counter. Flonase: Use this medication, as directed, for nasal and sinus congestion.  This medication is available over-the-counter. Congestion: Plain Mucinex may help relieve congestion. Saline sinus rinses and saline nasal sprays may also help relieve congestion.  Sore throat: Warm liquids or Chloraseptic spray may help soothe a sore throat. Gargle twice a day with a salt water solution made from a half teaspoon of salt in a cup of warm water.    Follow up: Follow up with a primary care provider within the next two weeks should symptoms fail to resolve. Return: Return to the ED for significantly worsening symptoms, shortness of breath, persistent vomiting, large amounts of blood in stool, or any other major concerns.

## 2018-01-21 NOTE — ED Triage Notes (Signed)
Pt reports she is have chest tightness and SOB x7 days. Pt also reports she is having back spasms. Pt able to speak in full sentences and ambulate without distress

## 2018-01-22 MED FILL — predniSONE 10 MG TABS: 10 | 4 days supply | Qty: 16 | Fill #0

## 2018-01-22 MED FILL — ?DOXYCYCLINE 100MG TABLET: 100 | 5 days supply | Qty: 10 | Fill #0

## 2018-05-05 ENCOUNTER — Encounter (INDEPENDENT_AMBULATORY_CARE_PROVIDER_SITE_OTHER): Payer: Self-pay | Admitting: Physician Assistant

## 2018-05-05 ENCOUNTER — Other Ambulatory Visit (HOSPITAL_COMMUNITY)
Admission: RE | Admit: 2018-05-05 | Discharge: 2018-05-05 | Disposition: A | Discharge status: Home / Self Care | Payer: Medicaid Other | Source: Ambulatory Visit | Attending: Physician Assistant | Admitting: Physician Assistant

## 2018-05-05 ENCOUNTER — Ambulatory Visit (INDEPENDENT_AMBULATORY_CARE_PROVIDER_SITE_OTHER): Payer: Self-pay | Admitting: Physician Assistant

## 2018-05-05 VITALS — BP 154/82 | HR 99 | Temp 97.6°F | Resp 18 | Ht 65.5 in | Wt 157.0 lb

## 2018-05-05 DIAGNOSIS — Z7251 High risk heterosexual behavior: Secondary | ICD-10-CM | POA: Diagnosis not present

## 2018-05-05 DIAGNOSIS — Z202 Contact with and (suspected) exposure to infections with a predominantly sexual mode of transmission: Secondary | ICD-10-CM | POA: Diagnosis present

## 2018-05-05 DIAGNOSIS — Z91199 Patient's noncompliance with other medical treatment and regimen due to unspecified reason: Secondary | ICD-10-CM

## 2018-05-05 DIAGNOSIS — R232 Flushing: Secondary | ICD-10-CM

## 2018-05-05 DIAGNOSIS — I10 Essential (primary) hypertension: Secondary | ICD-10-CM

## 2018-05-05 DIAGNOSIS — Z9119 Patient's noncompliance with other medical treatment and regimen: Secondary | ICD-10-CM

## 2018-05-05 DIAGNOSIS — Z760 Encounter for issue of repeat prescription: Secondary | ICD-10-CM

## 2018-05-05 MED ORDER — ATORVASTATIN CALCIUM 40 MG PO TABS: 40.0000 mg | ORAL_TABLET | Freq: Every day | ORAL | 5 refills | Status: DC

## 2018-05-05 MED ORDER — AZITHROMYCIN 500 MG PO TABS
1000.0000 mg | ORAL_TABLET | Freq: Once | ORAL | Status: AC
  Administered 2018-05-05: 1000 mg via ORAL

## 2018-05-05 MED ORDER — PAROXETINE HCL 20 MG PO TABS: 20.0000 mg | ORAL_TABLET | Freq: Every day | ORAL | 3 refills | Status: DC

## 2018-05-05 MED ORDER — CEFTRIAXONE SODIUM 250 MG IJ SOLR
250.0000 mg | Freq: Once | INTRAMUSCULAR | Status: AC
  Administered 2018-05-05: 250 mg via INTRAMUSCULAR

## 2018-05-05 NOTE — Patient Instructions (Signed)
Perimenopause Perimenopause is the time when your body begins to move into the menopause (no menstrual period for 12 straight months). It is a natural process. Perimenopause can begin 2-8 years before the menopause and usually lasts for 1 year after the menopause. During this time, your ovaries may or may not produce an egg. The ovaries vary in their production of estrogen and progesterone hormones each month. This can cause irregular menstrual periods, difficulty getting pregnant, vaginal bleeding between periods, and uncomfortable symptoms. What are the causes?  Irregular production of the ovarian hormones, estrogen and progesterone, and not ovulating every month. Other causes include:  Tumor of the pituitary gland in the brain.  Medical disease that affects the ovaries.  Radiation treatment.  Chemotherapy.  Unknown causes.  Heavy smoking and excessive alcohol intake can bring on perimenopause sooner. What are the signs or symptoms?  Hot flashes.  Night sweats.  Irregular menstrual periods.  Decreased sex drive.  Vaginal dryness.  Headaches.  Mood swings.  Depression.  Memory problems.  Irritability.  Tiredness.  Weight gain.  Trouble getting pregnant.  The beginning of losing bone cells (osteoporosis).  The beginning of hardening of the arteries (atherosclerosis). How is this diagnosed? Your health care provider will make a diagnosis by analyzing your age, menstrual history, and symptoms. He or she will do a physical exam and note any changes in your body, especially your female organs. Female hormone tests may or may not be helpful depending on the amount of female hormones you produce and when you produce them. However, other hormone tests may be helpful to rule out other problems. How is this treated? In some cases, no treatment is needed. The decision on whether treatment is necessary during the perimenopause should be made by you and your health care  provider based on how the symptoms are affecting you and your lifestyle. Various treatments are available, such as:  Treating individual symptoms with a specific medicine for that symptom.  Herbal medicines that can help specific symptoms.  Counseling.  Group therapy. Follow these instructions at home:  Keep track of your menstrual periods (when they occur, how heavy they are, how long between periods, and how long they last) as well as your symptoms and when they started.  Only take over-the-counter or prescription medicines as directed by your health care provider.  Sleep and rest.  Exercise.  Eat a diet that contains calcium (good for your bones) and soy (acts like the estrogen hormone).  Do not smoke.  Avoid alcoholic beverages.  Take vitamin supplements as recommended by your health care provider. Taking vitamin E may help in certain cases.  Take calcium and vitamin D supplements to help prevent bone loss.  Group therapy is sometimes helpful.  Acupuncture may help in some cases. Contact a health care provider if:  You have questions about any symptoms you are having.  You need a referral to a specialist (gynecologist, psychiatrist, or psychologist). Get help right away if:  You have vaginal bleeding.  Your period lasts longer than 8 days.  Your periods are recurring sooner than 21 days.  You have bleeding after intercourse.  You have severe depression.  You have pain when you urinate.  You have severe headaches.  You have vision problems. This information is not intended to replace advice given to you by your health care provider. Make sure you discuss any questions you have with your health care provider. Document Released: 08/07/2004 Document Revised: 12/06/2015 Document Reviewed: 01/27/2013 Elsevier Interactive   Patient Education  2017 Elsevier Inc.  

## 2018-05-05 NOTE — Progress Notes (Signed)
Subjective:  Patient ID: Rachael Stone, female    DOB: 21-May-1966  Age: 52 y.o. MRN: [id number]  CC: exposure to STD  HPI Rachael A Johnsonis a 52 y.o.femalewith a PMH of HTN, cardiomyopathy, aortic insufficiency, mitral regurgitation, BTL, ETOH abuse, tobacco abuse, and marijuana abuse presentswith concern for exposure to STD. She was told by her partner that he was treated for gonorrhea. Pt has been experiencing vaginal discharge and odor as of late. Does not endorse fever, chills, nausea, vomiting, malaise, rash, genital lesions, arthralgias.     BP is noted to be elevated today. Reports not taking medications as directed. Last took anti-hypertensives four days ago. Pt is also drinking alcohol to the excess.     Pt has frequent hot flashes and asks for treatment. Last menstrual period three months ago.    Outpatient Medications Prior to Visit  Medication Sig Dispense Refill  . amLODipine (NORVASC) 10 MG tablet Take 1 tablet (10 mg total) by mouth daily. 90 tablet 2  . carvedilol (COREG) 6.25 MG tablet Take 1 tablet (6.25 mg total) by mouth 2 (two) times daily. (Patient taking differently: Take 6.25 mg by mouth daily. ) 180 tablet 2  . losartan (COZAAR) 25 MG tablet Take 25 mg by mouth daily.    . Potassium Chloride ER 20 MEQ TBCR Take 1 tablet by mouth 2 (two) times daily.  2  . atorvastatin (LIPITOR) 40 MG tablet Take 1 tablet (40 mg total) by mouth daily. (Patient not taking: Reported on 01/21/2018) 90 tablet 2  . furosemide (LASIX) 20 MG tablet Take 1 tablet (20 mg total) by mouth daily. 90 tablet 2   No facility-administered medications prior to visit.      ROS Review of Systems  Constitutional: Negative for chills, fever and malaise/fatigue.  Eyes: Negative for blurred vision.  Respiratory: Negative for shortness of breath.   Cardiovascular: Negative for chest pain and palpitations.  Gastrointestinal: Negative for abdominal pain and nausea.  Genitourinary: Negative for  dysuria and hematuria.       Vaginal discharge and odor  Musculoskeletal: Negative for joint pain and myalgias.  Skin: Negative for rash.  Neurological: Negative for tingling and headaches.  Endo/Heme/Allergies:       Hot flashes  Psychiatric/Behavioral: Negative for depression. The patient is not nervous/anxious.     Objective:  BP (!) 154/82 (BP Location: Left Arm, Patient Position: Sitting, Cuff Size: Normal)   Pulse 99   Temp 97.6 F (36.4 C) (Oral)   Resp 18   Ht 5' 5.5" (1.664 m)   Wt 157 lb (71.2 kg)   LMP 04/01/2018   SpO2 100%   BMI 25.73 kg/m   BP/Weight 05/05/2018 01/21/2018 1/74/9449  Systolic BP 675 916 384  Diastolic BP 82 81 62  Wt. (Lbs) 157 161 159  BMI 25.73 26.38 26.46      Physical Exam  Constitutional: She is oriented to person, place, and time.  Well developed, well nourished, NAD, polite  HENT:  Head: Normocephalic and atraumatic.  Eyes: No scleral icterus.  Neck: Normal range of motion. Neck supple. No thyromegaly present.  Cardiovascular: Normal rate, regular rhythm and normal heart sounds.  Pulmonary/Chest: Effort normal and breath sounds normal.  Musculoskeletal: She exhibits no edema.  Neurological: She is alert and oriented to person, place, and time.  Skin: Skin is warm and dry. No rash noted. No erythema. No pallor.  Psychiatric: She has a normal mood and affect. Her behavior is normal. Thought content normal.  Vitals reviewed.    Assessment & Plan:    1. Exposure to STD - cefTRIAXone (ROCEPHIN) injection 250 mg - azithromycin (ZITHROMAX) tablet 1,000 mg - Urine cytology ancillary only - HIV Antibody (routine testing w rflx) - [facility]PR  2. High risk heterosexual behavior - Urine cytology ancillary only - HIV Antibody (routine testing w rflx) - RPR  3. Hot flashes - PARoxetine (PAXIL) 20 MG tablet; Take 1 tablet (20 mg total) by mouth daily.  Dispense: 30 tablet; Refill: 3  4. Hypertension, unspecified type - Patient advised  to take her medications as directed. Return for BP check.   5. Medically noncompliant - Patient advised to take her medications as directed.  6. Medication refill - atorvastatin (LIPITOR) 40 MG tablet; Take 1 tablet (40 mg total) by mouth daily.  Dispense: 30 tablet; Refill: 5   Meds ordered this encounter  Medications  . cefTRIAXone (ROCEPHIN) injection 250 mg  . azithromycin (ZITHROMAX) tablet 1,000 mg  . PARoxetine (PAXIL) 20 MG tablet    Sig: Take 1 tablet (20 mg total) by mouth daily.    Dispense:  30 tablet    Refill:  3    Order Specific Question:   Supervising Provider    Answer:   Charlott Rakes [[id number]]  . atorvastatin (LIPITOR) 40 MG tablet    Sig: Take 1 tablet (40 mg total) by mouth daily.    Dispense:  30 tablet    Refill:  5    Order Specific Question:   Supervising Provider    Answer:   Charlott Rakes [[id number]]    Follow-up: Return in about 6 weeks (around 06/16/2018) for BP check with nurse.   Clent Demark PA

## 2018-05-06 ENCOUNTER — Telehealth (INDEPENDENT_AMBULATORY_CARE_PROVIDER_SITE_OTHER): Payer: Self-pay

## 2018-05-06 LAB — HIV ANTIBODY (ROUTINE TESTING W REFLEX): HIV SCREEN 4TH GENERATION: NONREACTIVE

## 2018-05-06 LAB — RPR: RPR: NONREACTIVE

## 2018-05-06 MED FILL — PARoxetine HCL 20 MG TABS: 20 | 30 days supply | Qty: 30 | Fill #0

## 2018-05-06 MED FILL — ATORVASTATIN 40 MG TABLET: 40 | 30 days supply | Qty: 30 | Fill #0

## 2018-05-06 NOTE — Telephone Encounter (Signed)
-----   Message from Clent Demark, PA-C sent at 05/06/2018 12:25 PM EDT ----- Syphilis and HIV nonreactive

## 2018-05-06 NOTE — Telephone Encounter (Signed)
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 Rachael Stone, CMA

## 2018-05-07 ENCOUNTER — Telehealth (INDEPENDENT_AMBULATORY_CARE_PROVIDER_SITE_OTHER): Payer: Self-pay

## 2018-05-07 LAB — URINE CYTOLOGY ANCILLARY ONLY
Chlamydia: NEGATIVE
Neisseria Gonorrhea: NEGATIVE
Trichomonas: NEGATIVE

## 2018-05-07 NOTE — Telephone Encounter (Signed)
Patient is aware of chlamydia, gonorrhea and trichomonas negative. Nat Christen, CMA

## 2018-05-07 NOTE — Telephone Encounter (Signed)
-----   Message from Clent Demark, PA-C sent at 05/07/2018  1:00 PM EDT ----- Chlamydia, Gonorrhea, Trichomonas negative.

## 2018-05-08 LAB — URINE CYTOLOGY ANCILLARY ONLY: CANDIDA VAGINITIS: NEGATIVE

## 2018-05-10 ENCOUNTER — Other Ambulatory Visit (INDEPENDENT_AMBULATORY_CARE_PROVIDER_SITE_OTHER): Payer: Self-pay | Admitting: Physician Assistant

## 2018-05-10 DIAGNOSIS — B9689 Other specified bacterial agents as the cause of diseases classified elsewhere: Secondary | ICD-10-CM

## 2018-05-10 DIAGNOSIS — N760 Acute vaginitis: Principal | ICD-10-CM

## 2018-05-10 MED ORDER — METRONIDAZOLE 500 MG PO TABS: 500.0000 mg | ORAL_TABLET | Freq: Two times a day (BID) | ORAL | 0 refills | Status: AC

## 2018-05-12 ENCOUNTER — Telehealth (INDEPENDENT_AMBULATORY_CARE_PROVIDER_SITE_OTHER): Payer: Self-pay

## 2018-05-12 NOTE — Telephone Encounter (Signed)
Patient aware of BV and metronidazole being sent to [facility]. Nat Christen, CMA

## 2018-05-12 NOTE — Telephone Encounter (Signed)
-----   Message from Clent Demark, PA-C sent at 05/10/2018  6:51 PM EDT ----- Pt has BV. I have sent metronidazole to [facility].

## 2018-05-13 ENCOUNTER — Telehealth (INDEPENDENT_AMBULATORY_CARE_PROVIDER_SITE_OTHER): Payer: Self-pay | Admitting: Physician Assistant

## 2018-05-13 ENCOUNTER — Encounter

## 2018-05-13 NOTE — Telephone Encounter (Signed)
Patient called to request metroNIDAZOLE (FLAGYL) 500 MG tablet for her partner Mr. Erlene Quan. Stating that he does not have an appointment until Nov 18. She stated that how could she get cured if her partner is not being treat at the same time.  Please advice [phone]  Thank you Emmit Pomfret

## 2018-05-19 ENCOUNTER — Other Ambulatory Visit (INDEPENDENT_AMBULATORY_CARE_PROVIDER_SITE_OTHER): Payer: Self-pay | Admitting: Physician Assistant

## 2018-05-19 DIAGNOSIS — I10 Essential (primary) hypertension: Secondary | ICD-10-CM

## 2018-05-19 NOTE — Telephone Encounter (Signed)
FWD to cardiologist. Rachael Stone, [place]

## 2018-05-20 MED FILL — LOSARTAN POTASSIUM 25 MG TA: 25 | 30 days supply | Qty: 30 | Fill #0

## 2018-05-28 MED FILL — LOSARTAN POTASSIUM 25 MG TA: 25 | 30 days supply | Qty: 30 | Fill #1

## 2018-05-28 MED FILL — metroNIDAZOLE 500 MG TABS: 500 | 7 days supply | Qty: 14 | Fill #0

## 2018-06-18 ENCOUNTER — Encounter (INDEPENDENT_AMBULATORY_CARE_PROVIDER_SITE_OTHER): Payer: Self-pay

## 2018-06-18 ENCOUNTER — Ambulatory Visit (INDEPENDENT_AMBULATORY_CARE_PROVIDER_SITE_OTHER): Payer: Self-pay

## 2018-06-18 DIAGNOSIS — Z23 Encounter for immunization: Secondary | ICD-10-CM

## 2018-06-23 ENCOUNTER — Emergency Department (HOSPITAL_COMMUNITY)
Admission: EM | Admit: 2018-06-23 | Discharge: 2018-06-24 | Disposition: A | Discharge status: Home / Self Care | Payer: Medicaid Other | Attending: Emergency Medicine | Admitting: Emergency Medicine

## 2018-06-23 ENCOUNTER — Encounter (HOSPITAL_COMMUNITY): Payer: Self-pay

## 2018-06-23 ENCOUNTER — Other Ambulatory Visit: Payer: Self-pay

## 2018-06-23 DIAGNOSIS — I5042 Chronic combined systolic (congestive) and diastolic (congestive) heart failure: Secondary | ICD-10-CM | POA: Insufficient documentation

## 2018-06-23 DIAGNOSIS — R197 Diarrhea, unspecified: Secondary | ICD-10-CM

## 2018-06-23 DIAGNOSIS — I110 Hypertensive heart disease with heart failure: Secondary | ICD-10-CM | POA: Insufficient documentation

## 2018-06-23 DIAGNOSIS — Z79899 Other long term (current) drug therapy: Secondary | ICD-10-CM | POA: Insufficient documentation

## 2018-06-23 DIAGNOSIS — F17210 Nicotine dependence, cigarettes, uncomplicated: Secondary | ICD-10-CM | POA: Insufficient documentation

## 2018-06-23 LAB — URINALYSIS, ROUTINE W REFLEX MICROSCOPIC
BACTERIA UA: NONE SEEN
Bilirubin Urine: NEGATIVE
Glucose, UA: NEGATIVE mg/dL
Hgb urine dipstick: NEGATIVE
Ketones, ur: 5 mg/dL — AB
Nitrite: NEGATIVE
Protein, ur: NEGATIVE mg/dL
Specific Gravity, Urine: 1.016 (ref 1.005–1.030)
pH: 5 (ref 5.0–8.0)

## 2018-06-23 LAB — CBC WITH DIFFERENTIAL/PLATELET
Abs Immature Granulocytes: 0.01 10*3/uL (ref 0.00–0.07)
BASOS ABS: 0 10*3/uL (ref 0.0–0.1)
BASOS PCT: 0 %
EOS ABS: 0 10*3/uL (ref 0.0–0.5)
Eosinophils Relative: 0 %
HEMATOCRIT: 39.4 % (ref 36.0–46.0)
HEMOGLOBIN: 12.7 g/dL (ref 12.0–15.0)
Immature Granulocytes: 0 %
LYMPHS ABS: 0.6 10*3/uL — AB (ref 0.7–4.0)
Lymphocytes Relative: 11 %
MCH: 28.7 pg (ref 26.0–34.0)
MCHC: 32.2 g/dL (ref 30.0–36.0)
MCV: 88.9 fL (ref 80.0–100.0)
MONO ABS: 0.4 10*3/uL (ref 0.1–1.0)
Monocytes Relative: 7 %
NRBC: 0 % (ref 0.0–0.2)
Neutro Abs: 4.3 10*3/uL (ref 1.7–7.7)
Neutrophils Relative %: 82 %
PLATELETS: 221 10*3/uL (ref 150–400)
RBC: 4.43 MIL/uL (ref 3.87–5.11)
RDW: 14.4 % (ref 11.5–15.5)
WBC: 5.3 10*3/uL (ref 4.0–10.5)

## 2018-06-23 MED ORDER — SODIUM CHLORIDE 0.9 % IV BOLUS
500.0000 mL | Freq: Once | INTRAVENOUS | Status: AC
  Administered 2018-06-23: 500 mL via INTRAVENOUS

## 2018-06-23 MED ORDER — FAMOTIDINE IN NACL 20-0.9 MG/50ML-% IV SOLN
20.0000 mg | Freq: Once | INTRAVENOUS | Status: AC
  Administered 2018-06-23: 20 mg via INTRAVENOUS
  Filled 2018-06-23: qty 50

## 2018-06-23 MED ORDER — ONDANSETRON HCL 4 MG/2ML IJ SOLN
4.0000 mg | Freq: Once | INTRAMUSCULAR | Status: AC
  Administered 2018-06-23: 4 mg via INTRAVENOUS
  Filled 2018-06-23: qty 2

## 2018-06-23 NOTE — ED Notes (Signed)
Bed: YO84 Expected date:  Expected time:  Means of arrival:  Comments: EMS epigastric pain

## 2018-06-23 NOTE — ED Triage Notes (Signed)
Per EMS, patient coming from home with complaints of diarrhea. The patient has had diarrhea for the past two days, but it has gotten progressively worse today. She has had loose stools about every 30 minutes. She is complaining of epigastric pain that started this morning and is rating the pain 10/10. Denies chest pain and vomiting. Has had some nausea and dry heaves.  Patient has taken 3 doses of pepto-bismol and 2 doses of alka-seltzer today with no relief

## 2018-06-23 NOTE — ED Provider Notes (Signed)
[facility] DEPT Provider Note   CSN: [id number] Arrival date & time: 06/23/18  2137     History   Chief Complaint No chief complaint on file.   HPI Rachael Stone is a 52 y.o. female with a history of HTN, alcohol use disorder, nonischemic cardiomyopathy, combined systolic and diastolic heart failure, and mitral regurgitation who presents to the emergency department with a chief complaint of abdominal pain.  The patient endorses constant upper abdominal pain, characterized as burning, that began yesterday along with nonbloody diarrhea.  She reports having episodes of diarrhea almost every 2 hours since onset.  She reports associated nausea and dry heaves, but no vomiting.  She denies melena, hematochezia, hematemesis, dysuria, hematuria, urinary frequency or hesitancy, myalgias, or URI symptoms.  She has been having hot flashes, but denies fever or chills.   No known sick contacts. She is a 0.5 ppd smoker.  Reports that she drinks 312 ounce beers daily.  States that she had 1 shot of liquor earlier today.  The history is provided by the patient. No language interpreter was used.    Past Medical History:  Diagnosis Date  . Back pain   . Cardiomyopathy ([place])    a. 04/08/2016 Echo: EF 35%, diff HK, mild LVH, Gr2 DD, mod AI, mod to sev MR, mildly dil LA.  [name] ETOH abuse   . Hypertensive heart disease with heart failure ([place])   . Marijuana abuse   . Moderate aortic insufficiency    a. 04/08/2016 Echo: mod AI.  [name] Moderate to Severe Mitral Regurgitation    a. 04/08/2016 Echo: mod-sev MR directed centrally.  . Tobacco abuse     Patient Active Problem List   Diagnosis Date Noted  . BV (bacterial vaginosis) 01/26/2017  . Trichomonal infection 01/26/2017  . Essential hypertension 11/14/2016  . Normal coronary arteries 04/11/2016  . Hypertensive heart disease with heart failure ([place])   . NICM (nonischemic cardiomyopathy) ([place])   . Nonrheumatic aortic  valve insufficiency   . Moderate to Severe Mitral Regurgitation   . Tobacco abuse   . Acute pulmonary edema ([place]) 04/08/2016  . Elevated blood pressure 04/08/2016  . Dyspnea   . Acute combined systolic and diastolic heart failure ([place]) 04/07/2016  . VAGINAL DISCHARGE 09/10/2009  . CHEST PAIN UNSPECIFIED 08/13/2009  . DEPRESSIVE DISORDER NOT ELSEWHERE CLASSIFIED 12/14/2008  . MENORRHAGIA 12/14/2008  . NAUSEA, CHRONIC 12/14/2008  . ELEVATED BLOOD PRESSURE WITHOUT DIAGNOSIS OF HYPERTENSION 12/14/2008  . DYSFUNCTIONAL UTERINE BLEEDING 11/12/2007  . TOBACCO DEPENDENCE 09/10/2006  . GASTROESOPHAGEAL REFLUX, NO ESOPHAGITIS 09/10/2006    Past Surgical History:  Procedure Laterality Date  . CARDIAC CATHETERIZATION N/A 04/10/2016   Procedure: Right/Left Heart Cath and Coronary Angiography;  Surgeon: [name], MD;  Location: [facility];  Service: Cardiovascular;  Laterality: N/A;  . EXPLORATORY LAPAROTOMY  2003   Ectopic Pregnancy - unsure which side or if tube was removed  . TEE WITHOUT CARDIOVERSION N/A 06/26/2017   Procedure: TRANSESOPHAGEAL ECHOCARDIOGRAM (TEE);  Surgeon: Acie Fredrickson, Wonda Cheng, MD;  Location: [facility] ENDOSCOPY;  Service: Cardiovascular;  Laterality: N/A;  . TUBAL LIGATION       OB History    Gravida  3   Para  2   Term  0   Preterm  0   AB  1   Living  2     SAB  0   TAB  0   Ectopic  1   Multiple  0  Live Births               Home Medications    Prior to Admission medications   Medication Sig Start Date End Date Taking? Authorizing Provider  amLODipine (NORVASC) 10 MG tablet Take 1 tablet (10 mg total) by mouth daily. 11/19/17  Yes Nahser, Wonda Cheng, MD  aspirin-sod bicarb-citric acid (ALKA-SELTZER) 325 MG TBEF tablet Take 325 mg by mouth every 6 (six) hours as needed (diarrhea).   Yes [provider]  atorvastatin (LIPITOR) 40 MG tablet Take 1 tablet (40 mg total) by mouth daily. 05/05/18  Yes Clent Demark, PA-C    bismuth subsalicylate (PEPTO BISMOL) 262 MG/15ML suspension Take 30 mLs by mouth every 6 (six) hours as needed for indigestion or diarrhea or loose stools.   Yes [provider]  carvedilol (COREG) 6.25 MG tablet Take 1 tablet (6.25 mg total) by mouth 2 (two) times daily. Patient taking differently: Take 6.25 mg by mouth daily.  11/19/17  Yes Nahser, Wonda Cheng, MD  furosemide (LASIX) 20 MG tablet Take 1 tablet (20 mg total) by mouth daily. 11/19/17 06/23/18 Yes Nahser, Wonda Cheng, MD  losartan (COZAAR) 25 MG tablet TAKE 1 TABLET BY MOUTH DAILY. 05/19/18  Yes Nahser, Wonda Cheng, MD  PARoxetine (PAXIL) 20 MG tablet Take 1 tablet (20 mg total) by mouth daily. 05/05/18  Yes Clent Demark, PA-C  Potassium Chloride ER 20 MEQ TBCR Take 1 tablet by mouth 2 (two) times daily. 11/19/17  Yes [provider]  famotidine (PEPCID) 20 MG tablet Take 1 tablet (20 mg total) by mouth daily for 14 days. 06/24/18 07/08/18  Timika Muench A, PA-C  ondansetron (ZOFRAN ODT) 4 MG disintegrating tablet Take 1 tablet (4 mg total) by mouth every 8 (eight) hours as needed for nausea or vomiting. 06/24/18   Mychael Smock A, PA-C    Family History Family History  Problem Relation Age of Onset  . CAD Mother        First MI @ 44 - 66 total.  . Cirrhosis Father        alcoholic - died in his 15'Q.  . Lupus Sister     Social History Social History   Tobacco Use  . Smoking status: Current Every Day Smoker    Packs/day: 0.50    Years: 15.00    Pack years: 7.50    Types: Cigarettes  . Smokeless tobacco: Never Used  Substance Use Topics  . Alcohol use: Yes    Comment: At least 12 ounces of homemade moon shine every other day.  . Drug use: Yes    Types: Marijuana    Comment: Smokes marijuana daily.  Used to smoke cocaine laced  marijuana cigarettes for 15-20 yrs but quit cocaine 6 yrs ago.     Allergies   Ibuprofen   Review of Systems Review of Systems  Constitutional: Negative for activity change.   Respiratory: Negative for shortness of breath.   Cardiovascular: Negative for chest pain.  Gastrointestinal: Positive for abdominal pain, diarrhea and nausea. Negative for vomiting.  Genitourinary: Negative for dysuria.  Musculoskeletal: Negative for back pain.  Skin: Negative for rash.  Allergic/Immunologic: Negative for immunocompromised state.  Neurological: Negative for headaches.  Psychiatric/Behavioral: Negative for confusion.   Physical Exam Updated Vital Signs BP (!) 150/75 (BP Location: Right Arm)   Pulse 76   Temp 97.9 F (36.6 C) (Oral)   Resp 16   SpO2 100%   Physical Exam  Constitutional: No distress.  HENT:  Head: Normocephalic.  Eyes: Conjunctivae are normal.  Neck: Neck supple.  Cardiovascular: Normal rate, regular rhythm, normal heart sounds and intact distal pulses. Exam reveals no gallop and no friction rub.  No murmur heard. Pulmonary/Chest: Effort normal. No stridor. No respiratory distress. She has no wheezes. She has no rales. She exhibits no tenderness.  Abdominal: Soft. Bowel sounds are normal. She exhibits no distension and no mass. There is tenderness. There is no rebound and no guarding. No hernia.  Mild tenderness palpation in the epigastric region.  No right upper quadrant or left upper quadrant tenderness.  She is minimally tender bilaterally to the lower abdomen.  No rebound or guarding.  Abdomen is soft, nondistended.  Negative Murphy sign.  No tenderness over McBurney's point.  No CVA tenderness bilaterally.  Musculoskeletal: Normal range of motion. She exhibits no edema, tenderness or deformity.  Neurological: She is alert.  Ambulatory without difficulty.  Skin: Skin is warm. No rash noted. She is not diaphoretic.  Psychiatric: Her behavior is normal.  Nursing note and vitals reviewed.    ED Treatments / Results  Labs (all labs ordered are listed, but only abnormal results are displayed) Labs Reviewed  URINALYSIS, ROUTINE W REFLEX  MICROSCOPIC - Abnormal; Notable for the following components:      Result Value   Ketones, ur 5 (*)    Leukocytes, UA SMALL (*)    All other components within normal limits  COMPREHENSIVE METABOLIC PANEL - Abnormal; Notable for the following components:   Potassium 2.9 (*)    CO2 19 (*)    Glucose, Bld 126 (*)    BUN 5 (*)    Calcium 8.1 (*)    All other components within normal limits  CBC WITH DIFFERENTIAL/PLATELET - Abnormal; Notable for the following components:   Lymphs Abs 0.6 (*)    All other components within normal limits  LIPASE, BLOOD  CBC WITH DIFFERENTIAL/PLATELET    EKG None  Radiology No results found.  Procedures Procedures (including critical care time)  Medications Ordered in ED Medications  ondansetron (ZOFRAN) injection 4 mg (4 mg Intravenous Given 06/23/18 2324)  sodium chloride 0.9 % bolus 500 mL (0 mLs Intravenous Stopped 06/24/18 0024)  famotidine (PEPCID) IVPB 20 mg premix (0 mg Intravenous Stopped 06/23/18 2354)     Initial Impression / Assessment and Plan / ED Course  I have reviewed the triage vital signs and the nursing notes.  Pertinent labs & imaging results that were available during my care of the patient were reviewed by me and considered in my medical decision making (see chart for details).     52 year old female with a history of HTN, alcohol use disorder, nonischemic cardiomyopathy, combined systolic and diastolic heart failure, and mitral regurgitation senting with upper abdominal pain, nausea, and diarrhea, onset yesterday.  She has a history of alcohol use disorder.  No constitutional symptoms.  She has a history of acute combined systolic and diastolic heart failure.  On exam, she is nontoxic-appearing.  Will order 500 cc of normal saline, Zofran, and Pepcid and labs.  Labs are notable for bicarb of 19, likely secondary to diarrhea. No leukocytosis. She is having no GU symptoms.   On reevaluation, she reports that her abdominal  pain is resolved.  On repeat exam, she does not have a surgical abdomen.  She has had no further episodes of diarrhea.  She was successfully fluid challenged without vomiting.  She states that she is feeling much better and is  ready for discharge.  Encourage the patient to follow-up with her primary care provider for recheck in the next 2 to 3 days.  Will discharge home with Zofran and Pepcid.  Advised the patient to avoid Imodium given presumed infectious etiology of her symptoms.  Doubt diverticulitis, C. difficile, ovarian torsion, appendicitis, cholecystitis, PID.  Strict return precautions given.  She is hemodynamically stable and in no acute distress.  She is safe for discharge home with outpatient follow-up at this time.  Final Clinical Impressions(s) / ED Diagnoses   Final diagnoses:  Diarrhea of presumed infectious origin    ED Discharge Orders         Ordered    ondansetron (ZOFRAN ODT) 4 MG disintegrating tablet  Every 8 hours PRN     06/24/18 0312    famotidine (PEPCID) 20 MG tablet  Daily     06/24/18 0312           Joline Maxcy A, PA-C 06/24/18 6219    Daleen Bo, MD 06/24/18 1406

## 2018-06-24 LAB — COMPREHENSIVE METABOLIC PANEL
ALBUMIN: 3.8 g/dL (ref 3.5–5.0)
ALT: 29 U/L (ref 0–44)
AST: 26 U/L (ref 15–41)
Alkaline Phosphatase: 45 U/L (ref 38–126)
Anion gap: 13 (ref 5–15)
BILIRUBIN TOTAL: 0.6 mg/dL (ref 0.3–1.2)
BUN: 5 mg/dL — AB (ref 6–20)
CHLORIDE: 107 mmol/L (ref 98–111)
CO2: 19 mmol/L — AB (ref 22–32)
CREATININE: 0.75 mg/dL (ref 0.44–1.00)
Calcium: 8.1 mg/dL — ABNORMAL LOW (ref 8.9–10.3)
GFR calc Af Amer: >60 mL/min (ref >60)
GFR calc non Af Amer: >60 mL/min (ref >60)
GLUCOSE: 126 mg/dL — AB (ref 70–99)
POTASSIUM: 2.9 mmol/L — AB (ref 3.5–5.1)
Sodium: 139 mmol/L (ref 135–145)
TOTAL PROTEIN: 7.4 g/dL (ref 6.5–8.1)

## 2018-06-24 LAB — LIPASE, BLOOD: LIPASE: 32 U/L (ref 11–51)

## 2018-06-24 MED ORDER — FAMOTIDINE 20 MG PO TABS: 20.0000 mg | ORAL_TABLET | Freq: Every day | ORAL | 0 refills | Status: DC

## 2018-06-24 MED ORDER — ONDANSETRON 4 MG PO TBDP: 4.0000 mg | ORAL_TABLET | Freq: Three times a day (TID) | ORAL | 0 refills | Status: DC | PRN

## 2018-06-24 MED FILL — ONDANSETRON ODT 4 MG TABLET: 4 | 6 days supply | Qty: 20 | Fill #0

## 2018-06-24 MED FILL — FAMOTIDINE 20 MG TABLET: 20 | 14 days supply | Qty: 14 | Fill #0

## 2018-06-24 NOTE — ED Notes (Signed)
Provided patient with water for PO challenge. Patient tolerating fluids and denies N/V.

## 2018-06-24 NOTE — Discharge Instructions (Signed)
Thank you for allowing me to care for you today in the Emergency Department.   It is important that you stay hydrated while you are having diarrhea so that you do not get dehydrated.  Try to drink at least 64 ounces of water and/or an electrolyte replacement drink like Pedialyte or Gatorade.  Take 1 tablet of Zofran every 8 hours as needed for nausea or vomiting.  You can take 1 tablet of Pepcid daily to help with the abdominal pain.  Your symptoms are consistent with a viral illness.  Most viral illnesses will significantly improve in the next 3 to 5 days.  Please follow-up with your primary care provider if your symptoms persist or do not improve after this time.  Return to the emergency department if you develop a high fever of greater than 101.5, persistent vomiting despite taking Zofran, if you have bloody or black diarrhea or vomiting, or other new, concerning symptoms.

## 2018-07-26 ENCOUNTER — Other Ambulatory Visit: Payer: Self-pay | Admitting: Cardiovascular Disease

## 2018-07-26 MED ORDER — FUROSEMIDE 20 MG PO TABS: 20.0000 mg | ORAL_TABLET | Freq: Every day | ORAL | 1 refills | Status: DC

## 2018-07-26 MED FILL — ?FUROSEMIDE 20 MG TABLET: 20 | 30 days supply | Qty: 30 | Fill #0

## 2018-07-26 MED FILL — ATORVASTATIN 40 MG TABLET: 40 | 30 days supply | Qty: 30 | Fill #1

## 2018-07-28 ENCOUNTER — Other Ambulatory Visit: Payer: Self-pay

## 2018-07-28 ENCOUNTER — Encounter (HOSPITAL_COMMUNITY): Payer: Self-pay | Admitting: Emergency Medicine

## 2018-07-28 ENCOUNTER — Emergency Department (HOSPITAL_COMMUNITY)
Admission: EM | Admit: 2018-07-28 | Discharge: 2018-07-28 | Discharge status: Against Medical Advice | Payer: Medicaid Other | Attending: Emergency Medicine | Admitting: Emergency Medicine

## 2018-07-28 DIAGNOSIS — F10920 Alcohol use, unspecified with intoxication, uncomplicated: Secondary | ICD-10-CM | POA: Insufficient documentation

## 2018-07-28 DIAGNOSIS — R109 Unspecified abdominal pain: Secondary | ICD-10-CM | POA: Insufficient documentation

## 2018-07-28 DIAGNOSIS — Z5321 Procedure and treatment not carried out due to patient leaving prior to being seen by health care provider: Secondary | ICD-10-CM | POA: Insufficient documentation

## 2018-07-28 LAB — COMPREHENSIVE METABOLIC PANEL
ALT: 94 U/L — ABNORMAL HIGH (ref 0–44)
AST: 94 U/L — AB (ref 15–41)
Albumin: 4 g/dL (ref 3.5–5.0)
Alkaline Phosphatase: 46 U/L (ref 38–126)
Anion gap: 11 (ref 5–15)
BUN: 7 mg/dL (ref 6–20)
CO2: 17 mmol/L — ABNORMAL LOW (ref 22–32)
Calcium: 8.3 mg/dL — ABNORMAL LOW (ref 8.9–10.3)
Chloride: 107 mmol/L (ref 98–111)
Creatinine, Ser: 0.75 mg/dL (ref 0.44–1.00)
GFR calc Af Amer: >60 mL/min (ref >60)
Glucose, Bld: 126 mg/dL — ABNORMAL HIGH (ref 70–99)
Potassium: 3.8 mmol/L (ref 3.5–5.1)
Sodium: 135 mmol/L (ref 135–145)
TOTAL PROTEIN: 7.2 g/dL (ref 6.5–8.1)
Total Bilirubin: 0.5 mg/dL (ref 0.3–1.2)

## 2018-07-28 LAB — CBC
HCT: 48.2 % — ABNORMAL HIGH (ref 36.0–46.0)
Hemoglobin: 15 g/dL (ref 12.0–15.0)
MCH: 29.2 pg (ref 26.0–34.0)
MCHC: 31.1 g/dL (ref 30.0–36.0)
MCV: 94 fL (ref 80.0–100.0)
Platelets: 198 10*3/uL (ref 150–400)
RBC: 5.13 MIL/uL — ABNORMAL HIGH (ref 3.87–5.11)
RDW: 15 % (ref 11.5–15.5)
WBC: 9.4 10*3/uL (ref 4.0–10.5)
nRBC: 0 % (ref 0.0–0.2)

## 2018-07-28 LAB — LIPASE, BLOOD: Lipase: 53 U/L — ABNORMAL HIGH (ref 11–51)

## 2018-07-28 LAB — I-STAT BETA HCG BLOOD, ED (MC, WL, AP ONLY)

## 2018-07-28 NOTE — ED Notes (Signed)
Patient called for vital recheck with no answer.

## 2018-07-28 NOTE — ED Triage Notes (Signed)
Per EMS, patient from home, reports drinking liquor today. C/o abdominal pain. Denies N/V/D.

## 2018-07-28 NOTE — ED Notes (Signed)
2nd call for pt, no answer

## 2018-07-28 NOTE — ED Notes (Signed)
3rd call for pt, no answer

## 2018-07-28 NOTE — ED Notes (Signed)
Bed: [facility] Expected date:  Expected time:  Means of arrival:  Comments: 

## 2018-07-29 ENCOUNTER — Other Ambulatory Visit: Payer: Self-pay

## 2018-07-29 ENCOUNTER — Encounter (HOSPITAL_COMMUNITY): Payer: Self-pay | Admitting: *Deleted

## 2018-07-29 ENCOUNTER — Emergency Department (HOSPITAL_COMMUNITY)
Admission: EM | Admit: 2018-07-29 | Discharge: 2018-07-29 | Disposition: A | Discharge status: Home / Self Care | Payer: Medicaid Other | Attending: Emergency Medicine | Admitting: Emergency Medicine

## 2018-07-29 DIAGNOSIS — R1013 Epigastric pain: Secondary | ICD-10-CM | POA: Insufficient documentation

## 2018-07-29 DIAGNOSIS — I110 Hypertensive heart disease with heart failure: Secondary | ICD-10-CM | POA: Insufficient documentation

## 2018-07-29 DIAGNOSIS — R740 Nonspecific elevation of levels of transaminase and lactic acid dehydrogenase [LDH]: Secondary | ICD-10-CM | POA: Insufficient documentation

## 2018-07-29 DIAGNOSIS — F17210 Nicotine dependence, cigarettes, uncomplicated: Secondary | ICD-10-CM | POA: Insufficient documentation

## 2018-07-29 DIAGNOSIS — I509 Heart failure, unspecified: Secondary | ICD-10-CM | POA: Insufficient documentation

## 2018-07-29 DIAGNOSIS — R110 Nausea: Secondary | ICD-10-CM | POA: Insufficient documentation

## 2018-07-29 DIAGNOSIS — Z79899 Other long term (current) drug therapy: Secondary | ICD-10-CM | POA: Insufficient documentation

## 2018-07-29 DIAGNOSIS — R7401 Elevation of levels of liver transaminase levels: Secondary | ICD-10-CM

## 2018-07-29 DIAGNOSIS — R1084 Generalized abdominal pain: Secondary | ICD-10-CM

## 2018-07-29 DIAGNOSIS — R252 Cramp and spasm: Secondary | ICD-10-CM

## 2018-07-29 LAB — COMPREHENSIVE METABOLIC PANEL
ALK PHOS: 52 U/L (ref 38–126)
ALT: 99 U/L — AB (ref 0–44)
AST: 85 U/L — ABNORMAL HIGH (ref 15–41)
Albumin: 4.7 g/dL (ref 3.5–5.0)
Anion gap: 12 (ref 5–15)
BUN: 6 mg/dL (ref 6–20)
CO2: 19 mmol/L — ABNORMAL LOW (ref 22–32)
Calcium: 8.6 mg/dL — ABNORMAL LOW (ref 8.9–10.3)
Chloride: 103 mmol/L (ref 98–111)
Creatinine, Ser: 0.76 mg/dL (ref 0.44–1.00)
GFR calc Af Amer: >60 mL/min (ref >60)
GFR calc non Af Amer: >60 mL/min (ref >60)
Glucose, Bld: 134 mg/dL — ABNORMAL HIGH (ref 70–99)
Potassium: 3.7 mmol/L (ref 3.5–5.1)
Sodium: 134 mmol/L — ABNORMAL LOW (ref 135–145)
Total Bilirubin: 0.8 mg/dL (ref 0.3–1.2)
Total Protein: 8.4 g/dL — ABNORMAL HIGH (ref 6.5–8.1)

## 2018-07-29 LAB — RAPID URINE DRUG SCREEN, HOSP PERFORMED
Amphetamines: NOT DETECTED
Barbiturates: NOT DETECTED
Benzodiazepines: NOT DETECTED
Cocaine: NOT DETECTED
OPIATES: NOT DETECTED
Tetrahydrocannabinol: POSITIVE — AB

## 2018-07-29 LAB — CBC
HCT: 47 % — ABNORMAL HIGH (ref 36.0–46.0)
Hemoglobin: 14.8 g/dL (ref 12.0–15.0)
MCH: 29.1 pg (ref 26.0–34.0)
MCHC: 31.5 g/dL (ref 30.0–36.0)
MCV: 92.5 fL (ref 80.0–100.0)
Platelets: 221 10*3/uL (ref 150–400)
RBC: 5.08 MIL/uL (ref 3.87–5.11)
RDW: 14.7 % (ref 11.5–15.5)
WBC: 9.7 10*3/uL (ref 4.0–10.5)
nRBC: 0 % (ref 0.0–0.2)

## 2018-07-29 LAB — URINALYSIS, ROUTINE W REFLEX MICROSCOPIC
Bacteria, UA: NONE SEEN
Bilirubin Urine: NEGATIVE
Glucose, UA: NEGATIVE mg/dL
Ketones, ur: 5 mg/dL — AB
Nitrite: NEGATIVE
Protein, ur: NEGATIVE mg/dL
Specific Gravity, Urine: 1.015 (ref 1.005–1.030)
pH: 5 (ref 5.0–8.0)

## 2018-07-29 LAB — I-STAT BETA HCG BLOOD, ED (MC, WL, AP ONLY): I-stat hCG, quantitative: 5.4 m[IU]/mL — ABNORMAL HIGH (ref <5)

## 2018-07-29 LAB — LIPASE, BLOOD: Lipase: 44 U/L (ref 11–51)

## 2018-07-29 MED ORDER — METOCLOPRAMIDE HCL 5 MG/ML IJ SOLN
10.0000 mg | Freq: Once | INTRAMUSCULAR | Status: AC
  Administered 2018-07-29: 10 mg via INTRAVENOUS
  Filled 2018-07-29: qty 2

## 2018-07-29 MED ORDER — LIDOCAINE VISCOUS HCL 2 % MT SOLN
15.0000 mL | Freq: Once | OROMUCOSAL | Status: AC
  Administered 2018-07-29: 15 mL via ORAL
  Filled 2018-07-29: qty 15

## 2018-07-29 MED ORDER — SODIUM CHLORIDE 0.9% FLUSH
3.0000 mL | Freq: Once | INTRAVENOUS | Status: AC
  Administered 2018-07-29: 3 mL via INTRAVENOUS

## 2018-07-29 MED ORDER — SODIUM CHLORIDE 0.9 % IV BOLUS
1000.0000 mL | Freq: Once | INTRAVENOUS | Status: AC
  Administered 2018-07-29: 1000 mL via INTRAVENOUS

## 2018-07-29 MED ORDER — DIAZEPAM 5 MG PO TABS
5.0000 mg | ORAL_TABLET | Freq: Once | ORAL | Status: AC
  Administered 2018-07-29: 5 mg via ORAL
  Filled 2018-07-29: qty 1

## 2018-07-29 MED ORDER — KETOROLAC TROMETHAMINE 30 MG/ML IJ SOLN
15.0000 mg | Freq: Once | INTRAMUSCULAR | Status: AC
  Administered 2018-07-29: 15 mg via INTRAVENOUS
  Filled 2018-07-29: qty 1

## 2018-07-29 MED ORDER — PANTOPRAZOLE SODIUM 20 MG PO TBEC: 20.0000 mg | DELAYED_RELEASE_TABLET | Freq: Every day | ORAL | 1 refills | Status: DC

## 2018-07-29 MED ORDER — ALUM & MAG HYDROXIDE-SIMETH 200-200-20 MG/5ML PO SUSP
30.0000 mL | Freq: Once | ORAL | Status: AC
  Administered 2018-07-29: 30 mL via ORAL
  Filled 2018-07-29: qty 30

## 2018-07-29 NOTE — ED Triage Notes (Signed)
Pt is tearful in triage and states that she has "spasms in hands, knees, back, and rib cage" Pt reports abd pain. Pt states that the last time she had pain in her abd, she was told it was reflux but pt did not have the spasms.  Pt endorses nausea but no emesis. Pt a/o x 4 and ambulatory

## 2018-07-29 NOTE — Discharge Instructions (Signed)
We recommend that you discontinue drinking alcohol as this may be contributing to your abdominal discomfort.  Take Protonix as prescribed and follow-up with your primary care doctor.  You were found to have elevated liver tests today which is suspected to be due to your consistent drinking.  They should be rechecked by your doctor in approximately 1 week.  Drink plenty of water to prevent dehydration as this may worsen any cramps or spasms you are experiencing.  You may return to the ED for new or concerning symptoms.

## 2018-07-29 NOTE — ED Provider Notes (Signed)
[facility] DEPT Provider Note   CSN: [id number] Arrival date & time: 07/29/18  0024     History   Chief Complaint No chief complaint on file.   HPI Rachael Stone is a 53 y.o. female.  53 y.o. female with a history of HTN, alcohol use disorder, nonischemic cardiomyopathy, combined systolic and diastolic heart failure, and mitral regurgitation presents to the emergency department for multiple complaints.  She states that she has been experiencing pain in her abdomen.  It is mostly in her epigastrium.  She describes the pain as a burning sensation.  She has had some nausea associated with her abdominal pain, but no vomiting.  She does drink at least a sixpack of beer daily.  She states that she is trying to quit.  She has also developed, which she describes as, "spasms".  She states that they are intermittent and present in her hands, knees, back, calf muscles.  She is unaware of any modifying factors of the symptoms.  She states that they are very painful and make it difficult for her to ambulate or move.  She has not had any trauma or injury.  Denies any fevers.  She has not taken any medications for her symptoms today.     Past Medical History:  Diagnosis Date  . Back pain   . Cardiomyopathy ([place])    a. 04/08/2016 Echo: EF 35%, diff HK, mild LVH, Gr2 DD, mod AI, mod to sev MR, mildly dil LA.  [name] ETOH abuse   . Hypertensive heart disease with heart failure ([place])   . Marijuana abuse   . Moderate aortic insufficiency    a. 04/08/2016 Echo: mod AI.  [name] Moderate to Severe Mitral Regurgitation    a. 04/08/2016 Echo: mod-sev MR directed centrally.  . Tobacco abuse     Patient Active Problem List   Diagnosis Date Noted  . BV (bacterial vaginosis) 01/26/2017  . Trichomonal infection 01/26/2017  . Essential hypertension 11/14/2016  . Normal coronary arteries 04/11/2016  . Hypertensive heart disease with heart failure ([place])   . NICM (nonischemic  cardiomyopathy) ([place])   . Nonrheumatic aortic valve insufficiency   . Moderate to Severe Mitral Regurgitation   . Tobacco abuse   . Acute pulmonary edema ([place]) 04/08/2016  . Elevated blood pressure 04/08/2016  . Dyspnea   . Acute combined systolic and diastolic heart failure ([place]) 04/07/2016  . VAGINAL DISCHARGE 09/10/2009  . CHEST PAIN UNSPECIFIED 08/13/2009  . DEPRESSIVE DISORDER NOT ELSEWHERE CLASSIFIED 12/14/2008  . MENORRHAGIA 12/14/2008  . NAUSEA, CHRONIC 12/14/2008  . ELEVATED BLOOD PRESSURE WITHOUT DIAGNOSIS OF HYPERTENSION 12/14/2008  . DYSFUNCTIONAL UTERINE BLEEDING 11/12/2007  . TOBACCO DEPENDENCE 09/10/2006  . GASTROESOPHAGEAL REFLUX, NO ESOPHAGITIS 09/10/2006    Past Surgical History:  Procedure Laterality Date  . CARDIAC CATHETERIZATION N/A 04/10/2016   Procedure: Right/Left Heart Cath and Coronary Angiography;  Surgeon: [name], MD;  Location: [facility];  Service: Cardiovascular;  Laterality: N/A;  . EXPLORATORY LAPAROTOMY  2003   Ectopic Pregnancy - unsure which side or if tube was removed  . TEE WITHOUT CARDIOVERSION N/A 06/26/2017   Procedure: TRANSESOPHAGEAL ECHOCARDIOGRAM (TEE);  Surgeon: [name], MD;  Location: [facility] ENDOSCOPY;  Service: Cardiovascular;  Laterality: N/A;  . TUBAL LIGATION       OB History    Gravida  3   Para  2   Term  0   Preterm  0   AB  1   Living  2     SAB  0   TAB  0   Ectopic  1   Multiple  0   Live Births               Home Medications    Prior to Admission medications   Medication Sig Start Date End Date Taking? Authorizing Provider  amLODipine (NORVASC) 10 MG tablet Take 1 tablet (10 mg total) by mouth daily. 11/19/17  Yes Nahser, Wonda Cheng, MD  aspirin-sod bicarb-citric acid (ALKA-SELTZER) 325 MG TBEF tablet Take 325 mg by mouth every 6 (six) hours as needed (diarrhea).   Yes [provider]  atorvastatin (LIPITOR) 40 MG tablet Take 1 tablet (40 mg total) by mouth daily.  05/05/18  Yes Clent Demark, PA-C  bismuth subsalicylate (PEPTO BISMOL) 262 MG/15ML suspension Take 30 mLs by mouth every 6 (six) hours as needed for indigestion or diarrhea or loose stools.   Yes [provider]  carvedilol (COREG) 6.25 MG tablet Take 1 tablet (6.25 mg total) by mouth 2 (two) times daily. Patient taking differently: Take 6.25 mg by mouth daily.  11/19/17  Yes Nahser, Wonda Cheng, MD  losartan (COZAAR) 25 MG tablet TAKE 1 TABLET BY MOUTH DAILY. 05/19/18  Yes Nahser, Wonda Cheng, MD  PARoxetine (PAXIL) 20 MG tablet Take 1 tablet (20 mg total) by mouth daily. 05/05/18  Yes Clent Demark, PA-C  Potassium Chloride ER 20 MEQ TBCR Take 1 tablet by mouth 2 (two) times daily. 11/19/17  Yes [provider]  furosemide (LASIX) 20 MG tablet Take 1 tablet (20 mg total) by mouth daily. 07/26/18   Nahser, Wonda Cheng, MD  pantoprazole (PROTONIX) 20 MG tablet Take 1 tablet (20 mg total) by mouth daily. 07/29/18   Antonietta Breach, PA-C    Family History Family History  Problem Relation Age of Onset  . CAD Mother        First MI @ 63 - 75 total.  . Cirrhosis Father        alcoholic - died in his 38'G.  . Lupus Sister     Social History Social History   Tobacco Use  . Smoking status: Current Every Day Smoker    Packs/day: 0.50    Years: 15.00    Pack years: 7.50    Types: Cigarettes  . Smokeless tobacco: Never Used  Substance Use Topics  . Alcohol use: Yes    Comment: At least 12 ounces of homemade moon shine every other day.  . Drug use: Yes    Types: Marijuana    Comment: Smokes marijuana daily.  Used to smoke cocaine laced  marijuana cigarettes for 15-20 yrs but quit cocaine 6 yrs ago.     Allergies   Ibuprofen   Review of Systems Review of Systems Ten systems reviewed and are negative for acute change, except as noted in the HPI.    Physical Exam Updated Vital Signs BP (!) 182/84 (BP Location: Right Arm)   Pulse 88   Temp 98.2 F (36.8 C) (Oral)    Resp 16   Ht 5\' 5"  (1.651 m)   Wt 70.8 kg   SpO2 99%   BMI 25.96 kg/m   Physical Exam Vitals signs and nursing note reviewed.  Constitutional:      General: She is not in acute distress.    Appearance: She is well-developed. She is not diaphoretic.     Comments: Patient in no acute distress.  Appears disheveled.  HENT:  Head: Normocephalic and atraumatic.  Eyes:     General: No scleral icterus.    Conjunctiva/sclera: Conjunctivae normal.  Neck:     Musculoskeletal: Normal range of motion.  Cardiovascular:     Rate and Rhythm: Normal rate and regular rhythm.     Pulses: Normal pulses.  Pulmonary:     Effort: Pulmonary effort is normal. No respiratory distress.     Breath sounds: No stridor. No wheezing.     Comments: Respirations even and unlabored Abdominal:     Palpations: Abdomen is soft.     Comments: Abdomen soft, nondistended.  There is mild, generalized tenderness.  No peritoneal signs.  Musculoskeletal: Normal range of motion.     Right lower leg: No edema.     Left lower leg: No edema.     Comments: No appreciable muscle spasms on exam.  No bilateral lower extremity edema.  Skin:    General: Skin is warm and dry.     Coloration: Skin is not pale.     Findings: No erythema or rash.  Neurological:     Mental Status: She is alert and oriented to person, place, and time.     Coordination: Coordination normal.     Comments: GCS 15.  Speech is goal oriented.  She answers questions appropriately and follows commands.   Moving all extremities spontaneously.  She ambulates with steady gait.  Psychiatric:        Behavior: Behavior normal.      ED Treatments / Results  Labs (all labs ordered are listed, but only abnormal results are displayed) Labs Reviewed  COMPREHENSIVE METABOLIC PANEL - Abnormal; Notable for the following components:      Result Value   Sodium 134 (*)    CO2 19 (*)    Glucose, Bld 134 (*)    Calcium 8.6 (*)    Total Protein 8.4 (*)    AST  85 (*)    ALT 99 (*)    All other components within normal limits  CBC - Abnormal; Notable for the following components:   HCT 47.0 (*)    All other components within normal limits  URINALYSIS, ROUTINE W REFLEX MICROSCOPIC - Abnormal; Notable for the following components:   APPearance HAZY (*)    Hgb urine dipstick SMALL (*)    Ketones, ur 5 (*)    Leukocytes, UA TRACE (*)    All other components within normal limits  RAPID URINE DRUG SCREEN, HOSP PERFORMED - Abnormal; Notable for the following components:   Tetrahydrocannabinol POSITIVE (*)    All other components within normal limits  I-STAT BETA HCG BLOOD, ED ([facility], [facility], [facility] ONLY) - Abnormal; Notable for the following components:   I-stat hCG, quantitative 5.4 (*)    All other components within normal limits  LIPASE, BLOOD    EKG None  Radiology No results found.  Procedures Procedures (including critical care time)  Medications Ordered in ED Medications  sodium chloride flush (NS) 0.9 % injection 3 mL (3 mLs Intravenous Given 07/29/18 0117)  ketorolac (TORADOL) 30 MG/ML injection 15 mg (15 mg Intravenous Given 07/29/18 0210)  sodium chloride 0.9 % bolus 1,000 mL (0 mLs Intravenous Stopped 07/29/18 0431)  metoCLOPramide (REGLAN) injection 10 mg (10 mg Intravenous Given 07/29/18 0210)  sodium chloride 0.9 % bolus 1,000 mL (0 mLs Intravenous Stopped 07/29/18 0431)  diazepam (VALIUM) tablet 5 mg (5 mg Oral Given 07/29/18 0437)  alum & mag hydroxide-simeth (MAALOX/MYLANTA) 200-200-20 MG/5ML suspension 30 mL (30 mLs  Oral Given 07/29/18 0438)    And  lidocaine (XYLOCAINE) 2 % viscous mouth solution 15 mL (15 mLs Oral Given 07/29/18 0438)     Initial Impression / Assessment and Plan / ED Course  I have reviewed the triage vital signs and the nursing notes.  Pertinent labs & imaging results that were available during my care of the patient were reviewed by me and considered in my medical decision making (see chart for details).      53 year old female presents to the emergency department for multiple complaints.  Notes abdominal pain in her epigastrium with associated nausea.  She is a daily drinker and I believe that most of her pain is secondary to gastritis.  She was given a GI cocktail for this as well as Reglan.  She has not had any vomiting while in the ED.  Laboratory evaluation without leukocytosis.  She has a low bicarb, though this is unchanged compared to December.  It may be secondary to mild dehydration associated with alcoholic ketoacidosis.  She has no elevated anion gap.  Transaminitis is new compared to December, but is likely also related to her ongoing alcohol use.  She will be given a prescription for Protonix.  Patient also noting spasms in her back and extremities.  She reports that it makes it difficult for her to walk.  She has been ambulatory in the emergency department without difficulty.  She has no appreciable spasms on exam.  No significant electrolyte derangements.  She was hydrated with 2 L IV fluids, given Toradol and Valium with mild improvement.  Discussed that dehydration may also worsen the symptoms.  She has been instructed to follow-up with her primary care doctor to ensure that these symptoms resolve.  Return precautions discussed and provided. Patient discharged in stable condition with no unaddressed concerns.   Final Clinical Impressions(s) / ED Diagnoses   Final diagnoses:  Generalized abdominal pain  Muscle cramps  Transaminitis    ED Discharge Orders         Ordered    pantoprazole (PROTONIX) 20 MG tablet  Daily     07/29/18 0424           Antonietta Breach, PA-C 07/29/18 5501    Merrily Pew, MD 07/29/18 2337

## 2018-07-30 MED FILL — PANTOPRAZOLE SOD DR 20 MG T: 20 | 30 days supply | Qty: 30 | Fill #0

## 2018-09-24 ENCOUNTER — Other Ambulatory Visit: Payer: Self-pay

## 2018-09-24 ENCOUNTER — Emergency Department (HOSPITAL_COMMUNITY)
Admission: EM | Admit: 2018-09-24 | Discharge: 2018-09-24 | Disposition: A | Discharge status: Home / Self Care | Payer: Self-pay | Attending: Emergency Medicine | Admitting: Emergency Medicine

## 2018-09-24 ENCOUNTER — Encounter (HOSPITAL_COMMUNITY): Payer: Self-pay | Admitting: *Deleted

## 2018-09-24 DIAGNOSIS — I5042 Chronic combined systolic (congestive) and diastolic (congestive) heart failure: Secondary | ICD-10-CM | POA: Insufficient documentation

## 2018-09-24 DIAGNOSIS — Z79899 Other long term (current) drug therapy: Secondary | ICD-10-CM | POA: Insufficient documentation

## 2018-09-24 DIAGNOSIS — F17210 Nicotine dependence, cigarettes, uncomplicated: Secondary | ICD-10-CM | POA: Insufficient documentation

## 2018-09-24 DIAGNOSIS — I110 Hypertensive heart disease with heart failure: Secondary | ICD-10-CM | POA: Insufficient documentation

## 2018-09-24 DIAGNOSIS — M5431 Sciatica, right side: Secondary | ICD-10-CM | POA: Insufficient documentation

## 2018-09-24 MED ORDER — METHOCARBAMOL 500 MG PO TABS
1000.0000 mg | ORAL_TABLET | Freq: Once | ORAL | Status: AC
  Administered 2018-09-24: 1000 mg via ORAL
  Filled 2018-09-24: qty 2

## 2018-09-24 MED ORDER — KETOROLAC TROMETHAMINE 60 MG/2ML IM SOLN
60.0000 mg | Freq: Once | INTRAMUSCULAR | Status: AC
  Administered 2018-09-24: 60 mg via INTRAMUSCULAR
  Filled 2018-09-24: qty 2

## 2018-09-24 MED ORDER — ACETAMINOPHEN 500 MG PO TABS
1000.0000 mg | ORAL_TABLET | Freq: Once | ORAL | Status: AC
  Administered 2018-09-24: 1000 mg via ORAL
  Filled 2018-09-24: qty 2

## 2018-09-24 MED ORDER — METHOCARBAMOL 500 MG PO TABS: 1000.0000 mg | ORAL_TABLET | Freq: Three times a day (TID) | ORAL | 0 refills | Status: DC | PRN

## 2018-09-24 MED FILL — METHOCARBAMOL 500 MG TABS: 500 | 5 days supply | Qty: 30 | Fill #0

## 2018-09-24 NOTE — ED Notes (Signed)
Attempted to notify pt of left belongings.  Left HIPAA compliant voicemail

## 2018-09-24 NOTE — ED Provider Notes (Signed)
[facility] DEPT Provider Note   CSN: [id number] Arrival date & time: 09/24/18  0602    History   Chief Complaint Chief Complaint  Patient presents with  . Back Pain    HPI Rachael Stone is a 53 y.o. female.     HPI Patient with history of chronic back pain states that her back pain is been worsening over the last 3 to 4 days.  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  She says recently padded mats were removed because they were difficult to clean.  She thinks this may be contributing to her worsening pain.  Denies any trauma or heavy lifting.  States the pain radiates from her low back to her right buttock.  Denies any focal weakness or numbness.  No urinary or bladder incontinence.  No fever or chills.  States she is been taking ibuprofen at home with minimal relief. Past Medical History:  Diagnosis Date  . Back pain   . Cardiomyopathy ([place])    a. 04/08/2016 Echo: EF 35%, diff HK, mild LVH, Gr2 DD, mod AI, mod to sev MR, mildly dil LA.  [name] ETOH abuse   . Hypertensive heart disease with heart failure ([place])   . Marijuana abuse   . Moderate aortic insufficiency    a. 04/08/2016 Echo: mod AI.  [name] Moderate to Severe Mitral Regurgitation    a. 04/08/2016 Echo: mod-sev MR directed centrally.  . Tobacco abuse     Patient Active Problem List   Diagnosis Date Noted  . BV (bacterial vaginosis) 01/26/2017  . Trichomonal infection 01/26/2017  . Essential hypertension 11/14/2016  . Normal coronary arteries 04/11/2016  . Hypertensive heart disease with heart failure ([place])   . NICM (nonischemic cardiomyopathy) ([place])   . Nonrheumatic aortic valve insufficiency   . Moderate to Severe Mitral Regurgitation   . Tobacco abuse   . Acute pulmonary edema ([place]) 04/08/2016  . Elevated blood pressure 04/08/2016  . Dyspnea   . Acute combined systolic and diastolic heart failure ([place]) 04/07/2016  . VAGINAL DISCHARGE 09/10/2009  . CHEST PAIN  UNSPECIFIED 08/13/2009  . DEPRESSIVE DISORDER NOT ELSEWHERE CLASSIFIED 12/14/2008  . MENORRHAGIA 12/14/2008  . NAUSEA, CHRONIC 12/14/2008  . ELEVATED BLOOD PRESSURE WITHOUT DIAGNOSIS OF HYPERTENSION 12/14/2008  . DYSFUNCTIONAL UTERINE BLEEDING 11/12/2007  . TOBACCO DEPENDENCE 09/10/2006  . GASTROESOPHAGEAL REFLUX, NO ESOPHAGITIS 09/10/2006    Past Surgical History:  Procedure Laterality Date  . CARDIAC CATHETERIZATION N/A 04/10/2016   Procedure: Right/Left Heart Cath and Coronary Angiography;  Surgeon: [name], MD;  Location: [facility];  Service: Cardiovascular;  Laterality: N/A;  . EXPLORATORY LAPAROTOMY  2003   Ectopic Pregnancy - unsure which side or if tube was removed  . TEE WITHOUT CARDIOVERSION N/A 06/26/2017   Procedure: TRANSESOPHAGEAL ECHOCARDIOGRAM (TEE);  Surgeon: Acie Fredrickson, Wonda Cheng, MD;  Location: [facility] ENDOSCOPY;  Service: Cardiovascular;  Laterality: N/A;  . TUBAL LIGATION       OB History    Gravida  3   Para  2   Term  0   Preterm  0   AB  1   Living  2     SAB  0   TAB  0   Ectopic  1   Multiple  0   Live Births               Home Medications    Prior to Admission medications   Medication Sig Start Date  End Date Taking? Authorizing Provider  atorvastatin (LIPITOR) 40 MG tablet Take 1 tablet (40 mg total) by mouth daily. 05/05/18  Yes Clent Demark, PA-C  bismuth subsalicylate (PEPTO BISMOL) 262 MG/15ML suspension Take 30 mLs by mouth every 6 (six) hours as needed for indigestion or diarrhea or loose stools.   Yes [provider]  carvedilol (COREG) 6.25 MG tablet Take 1 tablet (6.25 mg total) by mouth 2 (two) times daily. Patient taking differently: Take 6.25 mg by mouth daily.  11/19/17  Yes Nahser, Wonda Cheng, MD  furosemide (LASIX) 20 MG tablet Take 1 tablet (20 mg total) by mouth daily. 07/26/18  Yes Nahser, Wonda Cheng, MD  ibuprofen (ADVIL,MOTRIN) 200 MG tablet Take 400 mg by mouth every 6 (six) hours as needed  for headache or mild pain.   Yes [provider]  losartan (COZAAR) 25 MG tablet TAKE 1 TABLET BY MOUTH DAILY. 05/19/18  Yes Nahser, Wonda Cheng, MD  polyvinyl alcohol (LIQUIFILM TEARS) 1.4 % ophthalmic solution Place 1 drop into both eyes as needed for dry eyes.   Yes [provider]  Potassium Chloride ER 20 MEQ TBCR Take 1 tablet by mouth daily.  11/19/17  Yes [provider]  amLODipine (NORVASC) 10 MG tablet Take 1 tablet (10 mg total) by mouth daily. Patient not taking: Reported on 09/24/2018 11/19/17   Nahser, Wonda Cheng, MD  pantoprazole (PROTONIX) 20 MG tablet Take 1 tablet (20 mg total) by mouth daily. Patient not taking: Reported on 09/24/2018 07/29/18   Antonietta Breach, PA-C  PARoxetine (PAXIL) 20 MG tablet Take 1 tablet (20 mg total) by mouth daily. Patient not taking: Reported on 09/24/2018 05/05/18   Clent Demark, PA-C    Family History Family History  Problem Relation Age of Onset  . CAD Mother        First MI @ 54 - 68 total.  . Cirrhosis Father        alcoholic - died in his 55'D.  . Lupus Sister     Social History Social History   Tobacco Use  . Smoking status: Current Every Day Smoker    Packs/day: 0.50    Years: 15.00    Pack years: 7.50    Types: Cigarettes  . Smokeless tobacco: Never Used  Substance Use Topics  . Alcohol use: Yes    Comment: At least 12 ounces of homemade moon shine every other day.  . Drug use: Yes    Types: Marijuana    Comment: Smokes marijuana daily.  Used to smoke cocaine laced  marijuana cigarettes for 15-20 yrs but quit cocaine 6 yrs ago.     Allergies   Ibuprofen   Review of Systems Review of Systems  Constitutional: Negative for chills and fever.  Gastrointestinal: Negative for abdominal pain, nausea and vomiting.  Genitourinary: Negative for dysuria and flank pain.  Musculoskeletal: Positive for back pain and myalgias. Negative for neck pain.  Skin: Negative for rash and wound.  Neurological: Negative  for dizziness, weakness, light-headedness, numbness and headaches.  All other systems reviewed and are negative.    Physical Exam Updated Vital Signs BP 126/73   Pulse 91   Temp 97.8 F (36.6 C) (Oral)   Resp 20   Ht 5' 5.5" (1.664 m)   Wt 68.9 kg   SpO2 100%   BMI 24.91 kg/m   Physical Exam Vitals signs and nursing note reviewed.  Constitutional:      Appearance: Normal appearance. She is well-developed.  HENT:     Head: Normocephalic and atraumatic.  Eyes:     Pupils: Pupils are equal, round, and reactive to light.  Neck:     Musculoskeletal: Normal range of motion and neck supple.  Cardiovascular:     Rate and Rhythm: Normal rate and regular rhythm.  Pulmonary:     Effort: Pulmonary effort is normal.     Breath sounds: Normal breath sounds.  Abdominal:     General: Bowel sounds are normal.     Palpations: Abdomen is soft.     Tenderness: There is no abdominal tenderness. There is no guarding or rebound.  Musculoskeletal: Normal range of motion.        General: Tenderness present.     Comments: Mild superior lumbar and right lumbar paraspinal muscular tenderness to palpation.  Negative straight leg raise.  No CVA tenderness.  No lower extremity swelling, asymmetry or tenderness.  Distal pulses intact.  Skin:    General: Skin is warm and dry.     Findings: No erythema or rash.  Neurological:     General: No focal deficit present.     Mental Status: She is alert and oriented to person, place, and time.     Comments: 5/5 motor in all extremities.  Sensation fully intact including no saddle anesthesia.  Ambulating without difficulty.  Psychiatric:        Mood and Affect: Mood normal.        Behavior: Behavior normal.      ED Treatments / Results  Labs (all labs ordered are listed, but only abnormal results are displayed) Labs Reviewed - No data to display  EKG None  Radiology No results found.  Procedures Procedures (including critical care time)   Medications Ordered in ED Medications  ketorolac (TORADOL) injection 60 mg (60 mg Intramuscular Given 09/24/18 0809)  methocarbamol (ROBAXIN) tablet 1,000 mg (1,000 mg Oral Given 09/24/18 0808)  acetaminophen (TYLENOL) tablet 1,000 mg (1,000 mg Oral Given 09/24/18 8315)     Initial Impression / Assessment and Plan / ED Course  I have reviewed the triage vital signs and the nursing notes.  Pertinent labs & imaging results that were available during my care of the patient were reviewed by me and considered in my medical decision making (see chart for details).        Symptoms consistent with sciatica.  No red flag signs or symptoms.  Will advise follow-up with sports medicine doctor and return precautions given.  Final Clinical Impressions(s) / ED Diagnoses   Final diagnoses:  Sciatica of right side    ED Discharge Orders    None       Julianne Rice, MD 09/24/18 [phone]

## 2018-09-24 NOTE — ED Notes (Signed)
ED Provider at bedside. 

## 2018-09-24 NOTE — ED Triage Notes (Signed)
Pt c/o low back pain x 4 days, is tearful.  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

## 2018-10-11 ENCOUNTER — Telehealth: Payer: Self-pay | Admitting: Physician Assistant

## 2018-10-11 NOTE — Telephone Encounter (Signed)
Patient called back in regards to paperwork. Please follow up

## 2018-10-11 NOTE — Telephone Encounter (Signed)
Patient needs a note for her employer that states she has a weak immune system due to her heart condition and the amount of medications she is taking. Please follow up.

## 2018-10-11 NOTE — Telephone Encounter (Signed)
Since the problem is her heart and she feels this is why she has a low immune system . The cardiologist needs to provide this information.

## 2018-10-11 NOTE — Telephone Encounter (Signed)
Nurse called the patient's home phone number but received no answer and message was left on the voicemail for the patient to call back.  Return phone number given. 

## 2018-10-12 ENCOUNTER — Telehealth: Payer: Self-pay | Admitting: Cardiovascular Disease

## 2018-10-12 ENCOUNTER — Other Ambulatory Visit: Payer: Self-pay

## 2018-10-12 ENCOUNTER — Telehealth (INDEPENDENT_AMBULATORY_CARE_PROVIDER_SITE_OTHER): Payer: Self-pay | Admitting: Cardiovascular Disease

## 2018-10-12 ENCOUNTER — Encounter: Payer: Self-pay | Admitting: Cardiovascular Disease

## 2018-10-12 ENCOUNTER — Encounter: Payer: Self-pay | Admitting: Nurse Practitioner

## 2018-10-12 ENCOUNTER — Ambulatory Visit (INDEPENDENT_AMBULATORY_CARE_PROVIDER_SITE_OTHER): Payer: Medicaid Other | Admitting: Primary Care

## 2018-10-12 VITALS — BP 139/81 | Ht 65.5 in | Wt 157.0 lb

## 2018-10-12 DIAGNOSIS — Z20822 Contact with and (suspected) exposure to covid-19: Secondary | ICD-10-CM

## 2018-10-12 DIAGNOSIS — J069 Acute upper respiratory infection, unspecified: Secondary | ICD-10-CM

## 2018-10-12 DIAGNOSIS — Z8679 Personal history of other diseases of the circulatory system: Secondary | ICD-10-CM

## 2018-10-12 DIAGNOSIS — R6889 Other general symptoms and signs: Principal | ICD-10-CM

## 2018-10-12 DIAGNOSIS — I5022 Chronic systolic (congestive) heart failure: Secondary | ICD-10-CM

## 2018-10-12 NOTE — Telephone Encounter (Signed)
Spoke with patient who states she is having more SOB and has gone to the ED and gotten an inhaler. 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 I advised her about our current situation with virtual visits and advised that Dr. Acie Fredrickson would need to assess her before he could advise her about any restrictions from her work. She is willing to try to download the webex app and set up her MyChart account. I am going to call her back once she has had time to complete these tasks.

## 2018-10-12 NOTE — Telephone Encounter (Signed)
New Message:     Pt called and said she needs a note for work please. She said it needs to state that with her condition it is not good for her  to be at work in that environment.

## 2018-10-12 NOTE — Telephone Encounter (Signed)
YOUR CARDIOLOGY TEAM HAS ARRANGED FOR AN E-VISIT FOR YOUR APPOINTMENT - PLEASE REVIEW IMPORTANT INFORMATION BELOW SEVERAL DAYS PRIOR TO YOUR APPOINTMENT  Due to the recent COVID-19 pandemic, we are transitioning in-person office visits to tele-medicine visits in an effort to decrease unnecessary exposure to our patients and staff. Medicare and most insurances are covering these visits without a copay needed. We also encourage you to sign up for MyChart if you have not already done so. You will need a smartphone if possible. For patients that do not have this, we can still complete the visit using a regular telephone but do prefer a smartphone to enable video when possible. You may have a close family member that lives with you that can help. If possible, we also ask that you have a blood pressure cuff and scale at home to measure your blood pressure, heart rate and weight prior to your scheduled appointment. Patients with clinical needs that need an in-person evaluation and testing will still be able to come to the office if absolutely necessary. If you have any questions, feel free to call our office.    IF YOU HAVE A SMARTPHONE, PLEASE DOWNLOAD THE WEBEX APP TO YOUR SMARTPHONE  - If Apple, go to [employer] and type in WebEx in the search bar. [employer], the blue/green circle. The app is free but as with any other app download, your phone may require you to verify saved payment information or Apple password. You do NOT have to create a WebEx account.  - If Android, go to [employer] and type in [employer] in the search bar. [place] [employer], the blue/green circle. The app is free but as with any other app download, your phone may require you to verify saved payment information or Android password. You do NOT have to create a WebEx account.  It is very helpful to have this downloaded before your visit.    2-3 DAYS BEFORE YOUR APPOINTMENT  You will receive a  telephone call from one of our [place] team members - your caller ID may say "Unknown caller." If this is a video visit, we will confirm that you have been able to download the WebEx app. We will remind you check your blood pressure, heart rate and weight prior to your scheduled appointment. If you have an Apple Watch or Kardia, please upload any pertinent ECG strips the day before or morning of your appointment to [place]. Our staff will also make sure you have reviewed the consent and agree to move forward with your scheduled tele-health visit.     THE DAY OF YOUR APPOINTMENT  Approximately 15 minutes prior to your scheduled appointment, you will receive a telephone call from one of [place] team - your caller ID may say "Unknown caller."  Our staff will confirm medications, vital signs for the day and any symptoms you may be experiencing. Please have this information available prior to the time of visit start. It may also be helpful for you to have a pad of paper and pen handy for any instructions given during your visit. They will also walk you through joining the WebEx smartphone meeting if this is a video visit.    CONSENT FOR TELE-HEALTH VISIT - PLEASE RVIEW  I hereby voluntarily request, consent and authorize [facility] and its employed or contracted physicians, physician assistants, nurse practitioners or other licensed health care professionals (the Practitioner), to provide me with telemedicine health care services (the "Services") as  deemed necessary by the treating Practitioner. I acknowledge and consent to receive the Services by the Practitioner via telemedicine. I understand that the telemedicine visit will involve communicating with the Practitioner through live audiovisual communication technology and the disclosure of certain medical information by electronic transmission. I acknowledge that I have been given the opportunity to request an in-person assessment or other available  alternative prior to the telemedicine visit and am voluntarily participating in the telemedicine visit.  I understand that I have the right to withhold or withdraw my consent to the use of telemedicine in the course of my care at any time, without affecting my right to future care or treatment, and that the Practitioner or I may terminate the telemedicine visit at any time. I understand that I have the right to inspect all information obtained and/or recorded in the course of the telemedicine visit and may receive copies of available information for a reasonable fee.  I understand that some of the potential risks of receiving the Services via telemedicine include:  . Delay or interruption in medical evaluation due to technological equipment failure or disruption; . Information transmitted may not be sufficient (e.g. poor resolution of images) to allow for appropriate medical decision making by the Practitioner; and/or  . In rare instances, security protocols could fail, causing a breach of personal health information.  Furthermore, I acknowledge that it is my responsibility to provide information about my medical history, conditions and care that is complete and accurate to the best of my ability. I acknowledge that Practitioner's advice, recommendations, and/or decision may be based on factors not within their control, such as incomplete or inaccurate data provided by me or distortions of diagnostic images or specimens that may result from electronic transmissions. I understand that the practice of medicine is not an exact science and that Practitioner makes no warranties or guarantees regarding treatment outcomes. I acknowledge that I will receive a copy of this consent concurrently upon execution via email to the email address I last provided but may also request a printed copy by calling the office of [facility].    I understand that my insurance will be billed for this visit.   I have read or had  this consent read to me. . I understand the contents of this consent, which adequately explains the benefits and risks of the Services being provided via telemedicine.  . I have been provided ample opportunity to ask questions regarding this consent and the Services and have had my questions answered to my satisfaction. . I give my informed consent for the services to be provided through the use of telemedicine in my medical care  By participating in this telemedicine visit I agree to the above.  

## 2018-10-12 NOTE — Progress Notes (Signed)
Virtual Visit via Telephone Note    Evaluation Performed:  Follow-up visit  This visit type was conducted due to national recommendations for restrictions regarding the COVID-19 Pandemic (e.g. social distancing).  This format is felt to be most appropriate for this patient at this time.  All issues noted in this document were discussed and addressed.  No physical exam was performed (except for noted visual exam findings with Video Visits).  Please refer to the patient's chart (MyChart message for video visits and phone note for telephone visits) for the patient's consent to telehealth for [facility].  Date:  10/12/2018   ID:  Rachael Stone, DOB [birth year], MRN [id number]  Patient Location:  Home   Provider location:    [facility], [place]   PCP:  Clent Demark, PA-C  Cardiologist:  Mertie Moores, MD  Electrophysiologist:  None   Chief Complaint:  Shortness of breath   History of Present Illness:    Rachael Stone is a 53 y.o. female who presents via audio/video conferencing for a telehealth visit today.     Visit done by phone  Has a hx of CHF.   EF had normalized by march 2019  aviods salt, Has been taking her meds .   No leg swelling ,     Has hot flashes and cold flashes. Thought she had the flu 2 weeks ago  Body aches  More shortness of breath over the past 2 weeks .   Lives with fiance    The patient does have symptoms concerning for COVID-19 infection (fever, chills, cough, or new shortness of breath).      Prior CV studies:   The following studies were reviewed today:    Past Medical History:  Diagnosis Date  . Back pain   . Cardiomyopathy ([place])    a. 04/08/2016 Echo: EF 35%, diff HK, mild LVH, Gr2 DD, mod AI, mod to sev MR, mildly dil LA.  [name] ETOH abuse   . Hypertensive heart disease with heart failure ([place])   . Marijuana abuse   . Moderate aortic insufficiency    a. 04/08/2016 Echo: mod AI.  [name] Moderate to Severe Mitral  Regurgitation    a. 04/08/2016 Echo: mod-sev MR directed centrally.  . Tobacco abuse    Past Surgical History:  Procedure Laterality Date  . CARDIAC CATHETERIZATION N/A 04/10/2016   Procedure: Right/Left Heart Cath and Coronary Angiography;  Surgeon: [name], MD;  Location: [place] [facility];  Service: Cardiovascular;  Laterality: N/A;  . EXPLORATORY LAPAROTOMY  2003   Ectopic Pregnancy - unsure which side or if tube was removed  . TEE WITHOUT CARDIOVERSION N/A 06/26/2017   Procedure: TRANSESOPHAGEAL ECHOCARDIOGRAM (TEE);  Surgeon: [name], MD;  Location: [place];  Service: Cardiovascular;  Laterality: N/A;  . TUBAL LIGATION       Current Meds  Medication Sig  . amLODipine (NORVASC) 10 MG tablet Take 1 tablet (10 mg total) by mouth daily.  [name] atorvastatin (LIPITOR) 40 MG tablet Take 1 tablet (40 mg total) by mouth daily.  [name] bismuth subsalicylate (PEPTO BISMOL) 262 MG/15ML suspension Take 30 mLs by mouth every 6 (six) hours as needed for indigestion or diarrhea or loose stools.  . carvedilol (COREG) 6.25 MG tablet Take 1 tablet (6.25 mg total) by mouth 2 (two) times daily.  . furosemide (LASIX) 20 MG tablet Take 1 tablet (20 mg total) by mouth daily.  [name] ibuprofen (ADVIL,MOTRIN) 200 MG tablet Take 400  mg by mouth every 6 (six) hours as needed for headache or mild pain.  [name] losartan (COZAAR) 25 MG tablet TAKE 1 TABLET BY MOUTH DAILY.  . methocarbamol (ROBAXIN) 500 MG tablet Take 2 tablets (1,000 mg total) by mouth every 8 (eight) hours as needed for muscle spasms.  . pantoprazole (PROTONIX) 20 MG tablet Take 1 tablet (20 mg total) by mouth daily.  . Potassium Chloride ER 20 MEQ TBCR Take 1 tablet by mouth daily.      Allergies:   Ibuprofen   Social History   Tobacco Use  . Smoking status: Current Every Day Smoker    Packs/day: 0.50    Years: 15.00    Pack years: 7.50    Types: Cigarettes  . Smokeless tobacco: Never Used  Substance Use Topics  . Alcohol  use: Yes    Comment: At least 12 ounces of homemade moon shine every other day.  . Drug use: Yes    Types: Marijuana    Comment: Smokes marijuana daily.  Used to smoke cocaine laced  marijuana cigarettes for 15-20 yrs but quit cocaine 6 yrs ago.     Family Hx: The patient's family history includes CAD in her mother; Cirrhosis in her father; Lupus in her sister.  ROS:   Please see the history of present illness.     All other systems reviewed and are negative.   Labs/Other Tests and Data Reviewed:    Recent Labs: 01/21/2018: B Natriuretic Peptide 25.2 07/29/2018: ALT 99; BUN 6; Creatinine, Ser 0.76; Hemoglobin 14.8; Platelets 221; Potassium 3.7; Sodium 134   Recent Lipid Panel Lab Results  Component Value Date/Time   CHOL 177 11/12/2007 08:44 PM   TRIG 159 (H) 11/12/2007 08:44 PM   HDL 44 11/12/2007 08:44 PM   CHOLHDL 4.0 Ratio 11/12/2007 08:44 PM   LDLCALC 101 (H) 11/12/2007 08:44 PM    Wt Readings from Last 3 Encounters:  10/12/18 157 lb (71.2 kg)  09/24/18 152 lb (68.9 kg)  07/29/18 156 lb (70.8 kg)     Objective:    Vital Signs:  BP 139/81 (BP Location: Left Arm, Patient Position: Sitting, Cuff Size: Normal)   Ht 5' 5.5" (1.664 m)   Wt 157 lb (71.2 kg)   BMI 25.73 kg/m     ASSESSMENT & PLAN:    1.   URI: Rachael Stone had a telemedicine visit today via telephone and had upper respiratory tract infection symptoms for the past 2 weeks.  Her symptoms are consistent with increasing shortness of breath, fever, chills, cough.  I do not think that this is a cardiac concern but I am concerned that she might have COVID-19.  I have told her to self quarantine for the next 2 weeks.  I have instructed her to have her fianc state a separate bedroom and have asked her to make sure that she does not have immediate contact with him.  At this point there is no testing available for patients with mild symptoms so we will not refer her to the emergency room.  Her symptoms do not sound  warrant hospitalization.  Because she needs to be quarantined we will write her a work note.  She works at [employer].    We will plan on seeing her in several months.  Work note  [phone] [place] Leave of abense     COVID-19 Education: The signs and symptoms of COVID-19 were discussed with the patient and how to seek care for testing (follow up with PCP  or arrange E-visit).  The importance of social distancing was discussed today.  Patient Risk:   After full review of this patient's clinical status, I feel that they are at least moderate risk at this time.  Time:   Today, I have spent 25  minutes with the patient with telehealth technology discussing  Her URI symptoms .     Medication Adjustments/Labs and Tests Ordered: Current medicines are reviewed at length with the patient today.  Concerns regarding medicines are outlined above.  Tests Ordered: No orders of the defined types were placed in this encounter.  Medication Changes: No orders of the defined types were placed in this encounter.   Disposition:  Follow up 3 months with me or Richardson Dopp, PA   Signed, Mertie Moores, MD  10/12/2018 5:43 PM    [place]

## 2018-10-12 NOTE — Telephone Encounter (Signed)
Patients call returned.  Patient identified by name and date of birth. Patient advised that she needed to call her cardiologist to be able to get the paperwork she wishes.

## 2018-10-13 MED FILL — LOSARTAN POTASSIUM 25 MG TA: 25 | 30 days supply | Qty: 30 | Fill #1

## 2018-11-06 ENCOUNTER — Encounter (HOSPITAL_COMMUNITY): Payer: Self-pay | Admitting: Emergency Medicine

## 2018-11-06 ENCOUNTER — Emergency Department (HOSPITAL_COMMUNITY)
Admission: EM | Admit: 2018-11-06 | Discharge: 2018-11-06 | Disposition: A | Discharge status: Home / Self Care | Payer: Medicaid Other | Attending: Emergency Medicine | Admitting: Emergency Medicine

## 2018-11-06 ENCOUNTER — Other Ambulatory Visit: Payer: Self-pay

## 2018-11-06 DIAGNOSIS — Z79899 Other long term (current) drug therapy: Secondary | ICD-10-CM | POA: Insufficient documentation

## 2018-11-06 DIAGNOSIS — R202 Paresthesia of skin: Secondary | ICD-10-CM | POA: Insufficient documentation

## 2018-11-06 DIAGNOSIS — F1290 Cannabis use, unspecified, uncomplicated: Secondary | ICD-10-CM | POA: Insufficient documentation

## 2018-11-06 DIAGNOSIS — F17210 Nicotine dependence, cigarettes, uncomplicated: Secondary | ICD-10-CM | POA: Insufficient documentation

## 2018-11-06 DIAGNOSIS — I509 Heart failure, unspecified: Secondary | ICD-10-CM | POA: Insufficient documentation

## 2018-11-06 DIAGNOSIS — M5441 Lumbago with sciatica, right side: Secondary | ICD-10-CM | POA: Insufficient documentation

## 2018-11-06 DIAGNOSIS — I110 Hypertensive heart disease with heart failure: Secondary | ICD-10-CM | POA: Insufficient documentation

## 2018-11-06 DIAGNOSIS — G8929 Other chronic pain: Secondary | ICD-10-CM

## 2018-11-06 MED ORDER — KETOROLAC TROMETHAMINE 15 MG/ML IJ SOLN
15.0000 mg | Freq: Once | INTRAMUSCULAR | Status: AC
  Administered 2018-11-06: 15 mg via INTRAMUSCULAR
  Filled 2018-11-06: qty 1

## 2018-11-06 MED ORDER — PREDNISONE 20 MG PO TABS: 40.0000 mg | ORAL_TABLET | Freq: Every day | ORAL | 0 refills | Status: AC

## 2018-11-06 MED ORDER — METHOCARBAMOL 500 MG PO TABS: 500.0000 mg | ORAL_TABLET | Freq: Two times a day (BID) | ORAL | 0 refills | Status: AC

## 2018-11-06 NOTE — ED Triage Notes (Signed)
Pt from home. Pt is here with right lower back pain that radiates down to her leg. Leg becomes numb when it happens. It has happened 7-8 times. Pain in back started in late March. Denies incontinence

## 2018-11-06 NOTE — ED Provider Notes (Signed)
[facility] EMERGENCY DEPARTMENT Provider Note   CSN: [id number] Arrival date & time: 11/06/18  1229    History   Chief Complaint No chief complaint on file.   HPI Rachael Stone is a 53 y.o. female with history of cardiomyopathy, valvular insufficiency, hypertension, tobacco and alcohol use presents today for right lower back pain radiating down her right leg.  Patient reports history of chronic right-sided lower back pain with right-sided sciatica.  Patient with a ED visit on 09/24/2018 for the same symptoms.  Patient reports she has a sharp pain that begins in her right lower back that radiates down the backside of her right leg all the way to her toes, severe in intensity intermittent worsened with movement and without alleviating factors.  Patient states that this current flareup began 7 days ago and has been worsening since onset.  Patient reports that after her prior ED visit on 09/24/2018 she was pain-free for 3-4 weeks.  She denies any injury or trauma leading to her current flareup.  Patient reports a numb/tingling sensation that shoots down her right leg when the pain is at its worst.  Chart review reveals that patient was treated with Toradol, Robaxin and Tylenol during her last visit.  Of note patient denies saddle area page seizures, bowel/bladder incontinence, urinary retention, fever, weight loss, history of cancer, history of IV drug use, fall/injury or trauma.     HPI  Past Medical History:  Diagnosis Date  . Back pain   . Cardiomyopathy ([place])    a. 04/08/2016 Echo: EF 35%, diff HK, mild LVH, Gr2 DD, mod AI, mod to sev MR, mildly dil LA.  [name] ETOH abuse   . Hypertensive heart disease with heart failure ([place])   . Marijuana abuse   . Moderate aortic insufficiency    a. 04/08/2016 Echo: mod AI.  [name] Moderate to Severe Mitral Regurgitation    a. 04/08/2016 Echo: mod-sev MR directed centrally.  . Tobacco abuse     Patient Active Problem List   Diagnosis Date Noted  . BV (bacterial vaginosis) 01/26/2017  . Trichomonal infection 01/26/2017  . Essential hypertension 11/14/2016  . Normal coronary arteries 04/11/2016  . Hypertensive heart disease with heart failure ([place])   . NICM (nonischemic cardiomyopathy) ([place])   . Nonrheumatic aortic valve insufficiency   . Moderate to Severe Mitral Regurgitation   . Tobacco abuse   . Acute pulmonary edema ([place]) 04/08/2016  . Elevated blood pressure 04/08/2016  . Dyspnea   . Acute combined systolic and diastolic heart failure ([place]) 04/07/2016  . VAGINAL DISCHARGE 09/10/2009  . CHEST PAIN UNSPECIFIED 08/13/2009  . DEPRESSIVE DISORDER NOT ELSEWHERE CLASSIFIED 12/14/2008  . MENORRHAGIA 12/14/2008  . NAUSEA, CHRONIC 12/14/2008  . ELEVATED BLOOD PRESSURE WITHOUT DIAGNOSIS OF HYPERTENSION 12/14/2008  . DYSFUNCTIONAL UTERINE BLEEDING 11/12/2007  . TOBACCO DEPENDENCE 09/10/2006  . GASTROESOPHAGEAL REFLUX, NO ESOPHAGITIS 09/10/2006    Past Surgical History:  Procedure Laterality Date  . CARDIAC CATHETERIZATION N/A 04/10/2016   Procedure: Right/Left Heart Cath and Coronary Angiography;  Surgeon: [name], MD;  Location: [facility];  Service: Cardiovascular;  Laterality: N/A;  . EXPLORATORY LAPAROTOMY  2003   Ectopic Pregnancy - unsure which side or if tube was removed  . TEE WITHOUT CARDIOVERSION N/A 06/26/2017   Procedure: TRANSESOPHAGEAL ECHOCARDIOGRAM (TEE);  Surgeon: [name], MD;  Location: [facility] ENDOSCOPY;  Service: Cardiovascular;  Laterality: N/A;  . TUBAL LIGATION       OB History  Gravida  3   Para  2   Term  0   Preterm  0   AB  1   Living  2     SAB  0   TAB  0   Ectopic  1   Multiple  0   Live Births               Home Medications    Prior to Admission medications   Medication Sig Start Date End Date Taking? Authorizing Provider  amLODipine (NORVASC) 10 MG tablet Take 1 tablet (10 mg total) by mouth daily. 11/19/17   Nahser,  Wonda Cheng, MD  atorvastatin (LIPITOR) 40 MG tablet Take 1 tablet (40 mg total) by mouth daily. 05/05/18   Clent Demark, PA-C  bismuth subsalicylate (PEPTO BISMOL) 262 MG/15ML suspension Take 30 mLs by mouth every 6 (six) hours as needed for indigestion or diarrhea or loose stools.    [provider]  carvedilol (COREG) 6.25 MG tablet Take 1 tablet (6.25 mg total) by mouth 2 (two) times daily. 11/19/17   Nahser, Wonda Cheng, MD  furosemide (LASIX) 20 MG tablet Take 1 tablet (20 mg total) by mouth daily. 07/26/18   Nahser, Wonda Cheng, MD  ibuprofen (ADVIL,MOTRIN) 200 MG tablet Take 400 mg by mouth every 6 (six) hours as needed for headache or mild pain.    [provider]  losartan (COZAAR) 25 MG tablet TAKE 1 TABLET BY MOUTH DAILY. 05/19/18   Nahser, Wonda Cheng, MD  methocarbamol (ROBAXIN) 500 MG tablet Take 1 tablet (500 mg total) by mouth 2 (two) times daily for 10 days. 11/06/18 11/16/18  Nuala Alpha A, PA-C  pantoprazole (PROTONIX) 20 MG tablet Take 1 tablet (20 mg total) by mouth daily. 07/29/18   Antonietta Breach, PA-C  Potassium Chloride ER 20 MEQ TBCR Take 1 tablet by mouth daily.  11/19/17   [provider]  predniSONE (DELTASONE) 20 MG tablet Take 2 tablets (40 mg total) by mouth daily for 5 days. 11/06/18 11/11/18  Deliah Boston, PA-C    Family History Family History  Problem Relation Age of Onset  . CAD Mother        First MI @ 78 - 47 total.  . Cirrhosis Father        alcoholic - died in his 91'Y.  . Lupus Sister     Social History Social History   Tobacco Use  . Smoking status: Current Every Day Smoker    Packs/day: 0.50    Years: 15.00    Pack years: 7.50    Types: Cigarettes  . Smokeless tobacco: Never Used  Substance Use Topics  . Alcohol use: Yes    Comment: At least 12 ounces of homemade moon shine every other day.  . Drug use: Yes    Types: Marijuana    Comment: Smokes marijuana daily.  Used to smoke cocaine laced  marijuana cigarettes for  15-20 yrs but quit cocaine 6 yrs ago.     Allergies   Ibuprofen   Review of Systems Review of Systems  Constitutional: Negative.  Negative for chills and fever.  Eyes: Negative.  Negative for visual disturbance.  Respiratory: Negative.  Negative for cough and shortness of breath.   Cardiovascular: Negative.  Negative for chest pain.  Gastrointestinal: Negative.  Negative for abdominal pain, nausea and vomiting.  Genitourinary: Negative.  Negative for dysuria, hematuria and pelvic pain.  Musculoskeletal: Positive for back pain. Negative for neck pain.  Neurological: Positive  for numbness (Tingling sensation shooting down right leg only during severe pain). Negative for dizziness, syncope, weakness and headaches.       Denies saddle area paresthesias Denies bowel/bladder incontinence Denies urinary retention   Physical Exam Updated Vital Signs BP (!) 161/76 (BP Location: Right Arm)   Pulse 73   Temp 98.3 F (36.8 C) (Oral)   Resp 16   Ht 5\' 5"  (1.651 m)   Wt 71.2 kg   SpO2 100%   BMI 26.13 kg/m   Physical Exam Constitutional:      General: She is not in acute distress.    Appearance: Normal appearance. She is not ill-appearing or diaphoretic.  HENT:     Head: Normocephalic and atraumatic. No raccoon eyes or Battle's sign.     Jaw: There is normal jaw occlusion. No trismus.     Right Ear: Tympanic membrane, ear canal and external ear normal. No hemotympanum.     Left Ear: Tympanic membrane, ear canal and external ear normal. No hemotympanum.     Nose: Nose normal.     Mouth/Throat:     Lips: Pink.     Mouth: Mucous membranes are moist.     Pharynx: Oropharynx is clear. Uvula midline.  Eyes:     General: Vision grossly intact. Gaze aligned appropriately.     Extraocular Movements: Extraocular movements intact.     Conjunctiva/sclera: Conjunctivae normal.     Pupils: Pupils are equal, round, and reactive to light.  Neck:     Musculoskeletal: Full passive range of  motion without pain, normal range of motion and neck supple. No neck rigidity.     Trachea: Trachea and phonation normal. No tracheal tenderness or tracheal deviation.  Cardiovascular:     Rate and Rhythm: Normal rate and regular rhythm.     Pulses:          Radial pulses are 2+ on the right side and 2+ on the left side.       Dorsalis pedis pulses are 2+ on the right side and 2+ on the left side.       Posterior tibial pulses are 2+ on the right side and 2+ on the left side.     Heart sounds: Normal heart sounds.  Pulmonary:     Effort: Pulmonary effort is normal. No respiratory distress.     Breath sounds: Normal breath sounds and air entry. No decreased breath sounds or rhonchi.  Abdominal:     General: Bowel sounds are normal. There is no distension.     Palpations: Abdomen is soft. There is no pulsatile mass.     Tenderness: There is no abdominal tenderness. There is no right CVA tenderness, left CVA tenderness, guarding or rebound.  Genitourinary:    Comments: Deferred by patient Musculoskeletal:       Back:     Right lower leg: Normal. She exhibits no tenderness. No edema.     Left lower leg: Normal. She exhibits no tenderness. No edema.     Comments: No midline C/T/L spinal tenderness to palpation no deformity, crepitus, or step-off noted. No sign of injury to the neck or back.  Reproducible muscular tenderness to palpation of the right gluteal musculature.  Positive straight leg test right side.   Feet:     Right foot:     Protective Sensation: 5 sites tested. 5 sites sensed.     Left foot:     Protective Sensation: 5 sites tested. 5 sites sensed.  Skin:  General: Skin is warm and dry.     Capillary Refill: Capillary refill takes less than 2 seconds.  Neurological:     Mental Status: She is alert.     GCS: GCS eye subscore is 4. GCS verbal subscore is 5. GCS motor subscore is 6.     Comments: Mental Status: Alert, oriented, thought content appropriate, able to  give a coherent history. Speech fluent without evidence of aphasia. Able to follow 2 step commands without difficulty. Cranial Nerves: 2 through 12 grossly intact bilaterally. Motor: Normal tone. 5/5 strength in upper and lower extremities bilaterally including strong and equal grip strength and dorsiflexion/plantar flexion Sensory: Sensation intact to light touch in all extremities.Negative Romberg.  Deep Tendon Reflexes: 2+ and symmetric patella, no clonus of feet bilaterally Cerebellar: normal finger-to-nose with bilateral upper extremities. Normal heel-to -shin balance bilaterally of the lower extremity. No pronator drift.  Gait: normal gait and balance CV: distal pulses palpable throughout  Psychiatric:        Behavior: Behavior is cooperative.    ED Treatments / Results  Labs (all labs ordered are listed, but only abnormal results are displayed) Labs Reviewed - No data to display  EKG None  Radiology No results found.  Procedures Procedures (including critical care time)  Medications Ordered in ED Medications  ketorolac (TORADOL) 15 MG/ML injection 15 mg (15 mg Intramuscular Given 11/06/18 1441)     Initial Impression / Assessment and Plan / ED Course  I have reviewed the triage vital signs and the nursing notes.  Pertinent labs & imaging results that were available during my care of the patient were reviewed by me and considered in my medical decision making (see chart for details).    Rachael Stone is a 53 y.o. female presenting with Right-sided lower back pain.  Pain has been present for 7 daysand is described as her typical back pain. Patient denies history of trauma, fever, IV drug use, night sweats, weight loss, cancer, saddle anesthesia, urinary rentention, bowel/bladder incontinence. No neurological deficits and normal neuro exam.   Suspect musculoskeletal etiology of patient's pain. Pain is consistently reproducible with palpation of the right gluteal  musculature and straight leg lift. No midline tenderness, stepoff, crepitus or deformity. Abdomen soft/nontender and without pulsatile mass. Patient with equal pedal pulses. Doubt spinal epidural abscess, cauda equina or AAA.  Imaging not indicated at this time.  Patient is ambulatory in the emergency department without assistance. RICE protocol and pain medicine indicated and discussed with patient.   Toradol 15mg  IM given. Patient denies history of CKD or gastric ulcers/bleeding. Patient denies chance of pregnancy and does not want testing. Robaxin 500mg  BID prescribed. Patient informed to avoid driving or operating heavy machinery while taking muscle relaxer. Prednisone 40 mg x 5 days prescribed.  Patient denies history of diabetes or adverse reaction to steroid medications.  At this time there does not appear to be any evidence of an acute emergency medical condition and the patient appears stable for discharge with appropriate outpatient follow up. Diagnosis was discussed with patient who verbalizes understanding of care plan and is agreeable to discharge. I have discussed return precautions with patient who verbalizes understanding of return precautions. Patient encouraged to follow-up with their PCP and ortho. All questions answered. Patient has been discharged in good condition.   Patient's case discussed with Dr. Eulis Foster who agrees with plan to discharge with follow-up.   Note: Portions of this report may have been transcribed using voice recognition software. Every  effort was made to ensure accuracy; however, inadvertent computerized transcription errors may still be present. Final Clinical Impressions(s) / ED Diagnoses   Final diagnoses:  Chronic right-sided low back pain with right-sided sciatica    ED Discharge Orders         Ordered    predniSONE (DELTASONE) 20 MG tablet  Daily     11/06/18 1449    methocarbamol (ROBAXIN) 500 MG tablet  2 times daily     11/06/18 1453            Gari Crown 11/06/18 1459    Daleen Bo, MD 11/08/18 [phone]

## 2018-11-06 NOTE — ED Notes (Signed)
Got patient into a gown on the monitor patient is resting with call bell in reach

## 2018-11-06 NOTE — Discharge Instructions (Addendum)
You have been diagnosed today with Right-Sided Lower Back Pain with Right-Sided Sciatica.  At this time there does not appear to be the presence of an emergent medical condition, however there is always the potential for conditions to change. Please read and follow the below instructions.  Please return to the Emergency Department immediately for any new or worsening symptoms. Please be sure to follow up with your Primary Care Provider within one week regarding your visit today; please call their office to schedule an appointment even if you are feeling better for a follow-up visit. You may use the steroid medication prednisone as prescribed, 40 mg daily for the next 5 days to help treat your sciatica symptoms. You may use the medication Robaxin as prescribed to help with your pain.  You may use this medication twice daily.  Do not drive or operate heavy machinery while taking Robaxin as will make you drowsy.  Do not take this medication with other sedating medications or with alcohol. Please follow-up with your primary care provider regarding your chronic back pain.  Additionally you have been given referral to orthopedic specialist for further evaluation if necessary.  You may call their office on Monday to schedule an appointment. Additionally you have been given an NSAID medication called Toradol today.  Please avoid further NSAID use including ibuprofen/naproxen/Advil or other NSAID-containing medications for the next 2 days.  Please drink plenty water.  Get help right away if: You cannot control when you pee (urinate) or poop (have a bowel movement). You have weakness in any of these areas and it gets worse. Lower back. Lower belly (pelvis). Butt (buttocks). Legs. You have redness or swelling of your back. You have a burning feeling when you pee. Get help right away if: You develop new bowel or bladder control problems. You have unusual weakness or numbness in your arms or legs. You  develop nausea or vomiting. You develop abdominal pain. You feel faint. Any new/concerning or worsening symptoms.  Please read the additional information packets attached to your discharge summary.  Do not take your medicine if  develop an itchy rash, swelling in your mouth or lips, or difficulty breathing.

## 2018-11-23 ENCOUNTER — Encounter: Payer: Self-pay | Admitting: Cardiovascular Disease

## 2018-11-23 NOTE — Telephone Encounter (Signed)
error 

## 2018-11-26 ENCOUNTER — Ambulatory Visit: Payer: Self-pay | Attending: Primary Care | Admitting: Primary Care

## 2018-11-26 ENCOUNTER — Other Ambulatory Visit: Payer: Self-pay

## 2018-11-26 DIAGNOSIS — I10 Essential (primary) hypertension: Secondary | ICD-10-CM

## 2018-11-26 DIAGNOSIS — Z760 Encounter for issue of repeat prescription: Secondary | ICD-10-CM

## 2018-11-26 DIAGNOSIS — G629 Polyneuropathy, unspecified: Secondary | ICD-10-CM

## 2018-11-26 DIAGNOSIS — Z720 Tobacco use: Secondary | ICD-10-CM

## 2018-11-26 DIAGNOSIS — M545 Low back pain: Secondary | ICD-10-CM

## 2018-11-26 MED ORDER — POTASSIUM CHLORIDE ER 20 MEQ PO TBCR: 1.0000 | EXTENDED_RELEASE_TABLET | Freq: Every day | ORAL | 2 refills | Status: DC

## 2018-11-26 MED ORDER — FUROSEMIDE 20 MG PO TABS: 20.0000 mg | ORAL_TABLET | Freq: Every day | ORAL | 3 refills | Status: DC

## 2018-11-26 MED ORDER — ATORVASTATIN CALCIUM 40 MG PO TABS: 40.0000 mg | ORAL_TABLET | Freq: Every day | ORAL | 3 refills | Status: DC

## 2018-11-26 MED ORDER — AMLODIPINE BESYLATE 10 MG PO TABS: 10.0000 mg | ORAL_TABLET | Freq: Every day | ORAL | 3 refills | Status: DC

## 2018-11-26 MED ORDER — CARVEDILOL 6.25 MG PO TABS: 6.2500 mg | ORAL_TABLET | Freq: Two times a day (BID) | ORAL | 3 refills | Status: DC

## 2018-11-26 MED ORDER — PANTOPRAZOLE SODIUM 20 MG PO TBEC: 20.0000 mg | DELAYED_RELEASE_TABLET | Freq: Every day | ORAL | 1 refills | Status: DC

## 2018-11-26 MED ORDER — LOSARTAN POTASSIUM 25 MG PO TABS: 25.0000 mg | ORAL_TABLET | Freq: Every day | ORAL | 3 refills | Status: DC

## 2018-11-26 MED FILL — POTASSIUM CL ER 20 MEQ TAB: 20 | 30 days supply | Qty: 30 | Fill #0

## 2018-11-26 MED FILL — ?CARVEDILOL 6.25 MG TABLET: 6.25 | 30 days supply | Qty: 60 | Fill #0

## 2018-11-26 MED FILL — ?ATORVASTATIN 40MG TABLET: 40 | 30 days supply | Qty: 30 | Fill #0

## 2018-11-26 MED FILL — LOSARTAN POTASSIUM 25 MG TA: 25 | 30 days supply | Qty: 30 | Fill #0

## 2018-11-26 MED FILL — ?FUROSEMIDE 20 MG TABLET: 20 | 30 days supply | Qty: 30 | Fill #0

## 2018-11-26 NOTE — Progress Notes (Signed)
Virtual Visit via Telephone Note  I connected with [name] on 11/26/18 at  8:50 AM EDT by telephone and verified that I am speaking with the correct person using two identifiers.   I discussed the limitations, risks, security and privacy concerns of performing an evaluation and management service by telephone and the availability of in person appointments. I also discussed with the patient that there may be a patient responsible charge related to this service. The patient expressed understanding and agreed to proceed.   History of Present Illness: Rachael Stone is a 53 y.o. female with history of cardiomyopathy, valvular insufficiency, hypertension, tobacco and alcohol use presents today for right lower back pain radiating down her right leg.    Observations/Objective: Review of Systems  Constitutional: Negative.   Eyes: Negative.   Respiratory: Negative.   Cardiovascular: Negative.   Gastrointestinal: Negative.   Genitourinary: Negative.   Musculoskeletal: Positive for joint pain.       Leg pain  Skin: Negative.   Neurological: Negative.   Endo/Heme/Allergies: Negative.   Psychiatric/Behavioral: Negative.     Assessment and Plan: Zona was seen today for hospitalization follow-up.  Diagnoses and all orders for this visit:  Neuropathy -     Compression stockings  Tobacco abuse  encourage at each visit d/w a weaning method . She was not interested in medication  Essential hypertension -     amLODipine (NORVASC) 10 MG tablet; Take 1 tablet (10 mg total) by mouth daily. -     carvedilol (COREG) 6.25 MG tablet; Take 1 tablet (6.25 mg total) by mouth 2 (two) times daily. -     CBC with Differential; Future -     CMP14+EGFR  Medication refill -     atorvastatin (LIPITOR) 40 MG tablet; Take 1 tablet (40 mg total) by mouth daily. -     CBC with Differential; Future -     CMP14+EGFR -     Lipid panel; Future  Hypertension, unspecified type -     losartan  (COZAAR) 25 MG tablet; Take 1 tablet (25 mg total) by mouth daily. -     CMP14+EGFR -     Lipid panel; Future  Other orders -     furosemide (LASIX) 20 MG tablet; Take 1 tablet (20 mg total) by mouth daily. -     Potassium Chloride ER 20 MEQ TBCR; Take 1 tablet by mouth daily. -     pantoprazole (PROTONIX) 20 MG tablet; Take 1 tablet (20 mg total) by mouth daily.    Follow Up Instructions:    I discussed the assessment and treatment plan with the patient. The patient was provided an opportunity to ask questions and all were answered. The patient agreed with the plan and demonstrated an understanding of the instructions.   The patient was advised to call back or seek an in-person evaluation if the symptoms worsen or if the condition fails to improve as anticipated.  I provided 20 minutes of non-face-to-face time during this encounter.   Kerin Perna, NP

## 2018-11-27 MED FILL — ?AMLODIPINE BESYLATE 10 MG: 10 | 30 days supply | Qty: 30 | Fill #0

## 2018-11-29 ENCOUNTER — Other Ambulatory Visit: Payer: Self-pay

## 2018-11-29 ENCOUNTER — Ambulatory Visit: Payer: Self-pay | Attending: Primary Care

## 2018-11-29 DIAGNOSIS — Z760 Encounter for issue of repeat prescription: Secondary | ICD-10-CM

## 2018-11-29 DIAGNOSIS — I10 Essential (primary) hypertension: Secondary | ICD-10-CM

## 2018-11-30 LAB — CBC WITH DIFFERENTIAL/PLATELET
Basophils Absolute: 0 10*3/uL (ref 0.0–0.2)
Basos: 1 %
EOS (ABSOLUTE): 0.1 10*3/uL (ref 0.0–0.4)
Eos: 2 %
Hematocrit: 37.7 % (ref 34.0–46.6)
Hemoglobin: 12.7 g/dL (ref 11.1–15.9)
Immature Grans (Abs): 0 10*3/uL (ref 0.0–0.1)
Immature Granulocytes: 0 %
Lymphocytes Absolute: 1.5 10*3/uL (ref 0.7–3.1)
Lymphs: 27 %
MCH: 29.4 pg (ref 26.6–33.0)
MCHC: 33.7 g/dL (ref 31.5–35.7)
MCV: 87 fL (ref 79–97)
Monocytes Absolute: 0.6 10*3/uL (ref 0.1–0.9)
Monocytes: 11 %
Neutrophils Absolute: 3.4 10*3/uL (ref 1.4–7.0)
Neutrophils: 59 %
Platelets: 175 10*3/uL (ref 150–450)
RBC: 4.32 x10E6/uL (ref 3.77–5.28)
RDW: 14.8 % (ref 11.7–15.4)
WBC: 5.6 10*3/uL (ref 3.4–10.8)

## 2018-11-30 LAB — LIPID PANEL
Chol/HDL Ratio: 2.8 ratio (ref 0.0–4.4)
Cholesterol, Total: 147 mg/dL (ref 100–199)
HDL: 52 mg/dL (ref >39)
LDL Calculated: 60 mg/dL (ref 0–99)
Triglycerides: 177 mg/dL — ABNORMAL HIGH (ref 0–149)
VLDL Cholesterol Cal: 35 mg/dL (ref 5–40)

## 2018-12-01 ENCOUNTER — Inpatient Hospital Stay: Payer: Medicaid Other

## 2018-12-03 ENCOUNTER — Telehealth: Payer: Self-pay | Admitting: *Deleted

## 2018-12-03 NOTE — Telephone Encounter (Signed)
-----   Message from Kerin Perna, NP sent at 11/30/2018  6:22 PM EDT ----- Patient is suppose to be on chol meds remind to take nightly and decrease fried foods chips

## 2018-12-03 NOTE — Telephone Encounter (Signed)
Medical Assistant left message on patient's home and cell voicemail. Voicemail states to give a call back to [place] with [facility] at [phone]. Patient is aware of needing to take cholesterol at night and avoid fatty foods

## 2018-12-14 ENCOUNTER — Telehealth: Payer: Self-pay | Admitting: Cardiovascular Disease

## 2018-12-14 NOTE — Telephone Encounter (Signed)
New Message   Patient states the doctor faxed papers over to her job to excuse her from work and she needs updated papers faxed again to [phone] attention [name] of Absence.  Please give patient a call.

## 2018-12-15 NOTE — Telephone Encounter (Signed)
Received call from patient who requests an extension on her work note. I asked what kind of symptoms she is having that would warrant an extension and she c/o coughing up phlegm and right leg pain. I advised that Dr. Acie Fredrickson would not be able to extend her LOA from work and that she would need to follow-up with her PCP or use appropriate PPE at work. She thanked me for the call.

## 2018-12-15 NOTE — Telephone Encounter (Signed)
Left message for patient to call back regarding work note

## 2018-12-23 ENCOUNTER — Encounter: Payer: Self-pay | Admitting: Nurse Practitioner

## 2018-12-23 ENCOUNTER — Telehealth: Payer: Self-pay | Admitting: Cardiovascular Disease

## 2018-12-23 NOTE — Telephone Encounter (Signed)
Patient requests Dr. Acie Fredrickson write a letter stating that she needs a face shield rather than a mask due to her history of SOB. She states the face shield will be provided by her employer when they receive the letter. She asks that it be faxed to: [phone] Attn: Annia Belt I advised her that I will send letter tomorrow when I am in the office. She verbalized understanding and agreement and thanked me for the call.

## 2018-12-23 NOTE — Telephone Encounter (Signed)
New message   Patient states that she is having sob while wearing a mask at work the patient states that she was advised to get a work note so that she can get a face shield. Please call to discuss.

## 2018-12-24 ENCOUNTER — Encounter (HOSPITAL_COMMUNITY): Payer: Self-pay | Admitting: Emergency Medicine

## 2018-12-24 ENCOUNTER — Emergency Department (HOSPITAL_COMMUNITY): Payer: Self-pay

## 2018-12-24 ENCOUNTER — Emergency Department (HOSPITAL_COMMUNITY)
Admission: EM | Admit: 2018-12-24 | Discharge: 2018-12-25 | Disposition: A | Discharge status: Home / Self Care | Payer: Self-pay | Attending: Emergency Medicine | Admitting: Emergency Medicine

## 2018-12-24 ENCOUNTER — Other Ambulatory Visit: Payer: Self-pay

## 2018-12-24 ENCOUNTER — Telehealth: Payer: Self-pay | Admitting: Cardiovascular Disease

## 2018-12-24 DIAGNOSIS — F17210 Nicotine dependence, cigarettes, uncomplicated: Secondary | ICD-10-CM | POA: Insufficient documentation

## 2018-12-24 DIAGNOSIS — I110 Hypertensive heart disease with heart failure: Secondary | ICD-10-CM | POA: Insufficient documentation

## 2018-12-24 DIAGNOSIS — Z79899 Other long term (current) drug therapy: Secondary | ICD-10-CM | POA: Insufficient documentation

## 2018-12-24 DIAGNOSIS — E876 Hypokalemia: Secondary | ICD-10-CM | POA: Insufficient documentation

## 2018-12-24 DIAGNOSIS — K2920 Alcoholic gastritis without bleeding: Secondary | ICD-10-CM | POA: Insufficient documentation

## 2018-12-24 DIAGNOSIS — I509 Heart failure, unspecified: Secondary | ICD-10-CM | POA: Insufficient documentation

## 2018-12-24 DIAGNOSIS — Z7982 Long term (current) use of aspirin: Secondary | ICD-10-CM | POA: Insufficient documentation

## 2018-12-24 DIAGNOSIS — R112 Nausea with vomiting, unspecified: Secondary | ICD-10-CM

## 2018-12-24 DIAGNOSIS — R0602 Shortness of breath: Secondary | ICD-10-CM | POA: Insufficient documentation

## 2018-12-24 LAB — COMPREHENSIVE METABOLIC PANEL
ALT: 33 U/L (ref 0–44)
AST: 33 U/L (ref 15–41)
Albumin: 4.4 g/dL (ref 3.5–5.0)
Alkaline Phosphatase: 52 U/L (ref 38–126)
Anion gap: 13 (ref 5–15)
BUN: 7 mg/dL (ref 6–20)
CO2: 23 mmol/L (ref 22–32)
Calcium: 8.9 mg/dL (ref 8.9–10.3)
Chloride: 102 mmol/L (ref 98–111)
Creatinine, Ser: 0.61 mg/dL (ref 0.44–1.00)
GFR calc Af Amer: >60 mL/min (ref >60)
GFR calc non Af Amer: >60 mL/min (ref >60)
Glucose, Bld: 136 mg/dL — ABNORMAL HIGH (ref 70–99)
Potassium: 2.6 mmol/L — CL (ref 3.5–5.1)
Sodium: 138 mmol/L (ref 135–145)
Total Bilirubin: 0.4 mg/dL (ref 0.3–1.2)
Total Protein: 8 g/dL (ref 6.5–8.1)

## 2018-12-24 LAB — URINALYSIS, ROUTINE W REFLEX MICROSCOPIC
Bilirubin Urine: NEGATIVE
Glucose, UA: NEGATIVE mg/dL
Ketones, ur: NEGATIVE mg/dL
Leukocytes,Ua: NEGATIVE
Nitrite: NEGATIVE
Protein, ur: NEGATIVE mg/dL
Specific Gravity, Urine: 1.012 (ref 1.005–1.030)
pH: 7 (ref 5.0–8.0)

## 2018-12-24 LAB — BLOOD GAS, VENOUS
Acid-Base Excess: 2.9 mmol/L — ABNORMAL HIGH (ref 0.0–2.0)
Bicarbonate: 25.2 mmol/L (ref 20.0–28.0)
O2 Saturation: 58.6 %
Patient temperature: 98.6
pCO2, Ven: 32.3 mmHg — ABNORMAL LOW (ref 44.0–60.0)
pH, Ven: 7.504 — ABNORMAL HIGH (ref 7.250–7.430)

## 2018-12-24 LAB — TROPONIN I: Troponin I: <0.03 ng/mL (ref <0.03)

## 2018-12-24 LAB — CBC
HCT: 40.8 % (ref 36.0–46.0)
Hemoglobin: 13.5 g/dL (ref 12.0–15.0)
MCH: 30 pg (ref 26.0–34.0)
MCHC: 33.1 g/dL (ref 30.0–36.0)
MCV: 90.7 fL (ref 80.0–100.0)
Platelets: 198 10*3/uL (ref 150–400)
RBC: 4.5 MIL/uL (ref 3.87–5.11)
RDW: 14.5 % (ref 11.5–15.5)
WBC: 8.5 10*3/uL (ref 4.0–10.5)
nRBC: 0 % (ref 0.0–0.2)

## 2018-12-24 LAB — RAPID URINE DRUG SCREEN, HOSP PERFORMED
Amphetamines: NOT DETECTED
Barbiturates: NOT DETECTED
Benzodiazepines: NOT DETECTED
Cocaine: NOT DETECTED
Opiates: NOT DETECTED
Tetrahydrocannabinol: POSITIVE — AB

## 2018-12-24 LAB — PROTIME-INR
INR: 0.9 (ref 0.8–1.2)
Prothrombin Time: 12.4 seconds (ref 11.4–15.2)

## 2018-12-24 LAB — LACTIC ACID, PLASMA
Lactic Acid, Venous: 2.9 mmol/L (ref 0.5–1.9)
Lactic Acid, Venous: 2.2 mmol/L (ref 0.5–1.9)

## 2018-12-24 LAB — I-STAT BETA HCG BLOOD, ED (MC, WL, AP ONLY): I-stat hCG, quantitative: <5 m[IU]/mL (ref <5)

## 2018-12-24 LAB — AMMONIA: Ammonia: 18 umol/L (ref 9–35)

## 2018-12-24 LAB — ETHANOL: Alcohol, Ethyl (B): 120 mg/dL — ABNORMAL HIGH (ref <10)

## 2018-12-24 LAB — BRAIN NATRIURETIC PEPTIDE: B Natriuretic Peptide: 41.3 pg/mL (ref 0.0–100.0)

## 2018-12-24 LAB — POTASSIUM: Potassium: 2.8 mmol/L — ABNORMAL LOW (ref 3.5–5.1)

## 2018-12-24 LAB — LIPASE, BLOOD: Lipase: 56 U/L — ABNORMAL HIGH (ref 11–51)

## 2018-12-24 MED ORDER — DIPHENHYDRAMINE HCL 50 MG/ML IJ SOLN
25.0000 mg | Freq: Once | INTRAMUSCULAR | Status: AC
  Administered 2018-12-24: 25 mg via INTRAVENOUS
  Filled 2018-12-24: qty 1

## 2018-12-24 MED ORDER — POTASSIUM CHLORIDE CRYS ER 20 MEQ PO TBCR
40.0000 meq | EXTENDED_RELEASE_TABLET | Freq: Once | ORAL | Status: AC
  Administered 2018-12-24: 40 meq via ORAL
  Filled 2018-12-24: qty 2

## 2018-12-24 MED ORDER — ONDANSETRON HCL 4 MG/2ML IJ SOLN
4.0000 mg | Freq: Once | INTRAMUSCULAR | Status: AC
  Administered 2018-12-24: 4 mg via INTRAVENOUS
  Filled 2018-12-24: qty 2

## 2018-12-24 MED ORDER — PANTOPRAZOLE SODIUM 40 MG IV SOLR
40.0000 mg | Freq: Once | INTRAVENOUS | Status: AC
  Administered 2018-12-24: 40 mg via INTRAVENOUS
  Filled 2018-12-24: qty 40

## 2018-12-24 MED ORDER — MORPHINE SULFATE (PF) 4 MG/ML IV SOLN
4.0000 mg | Freq: Once | INTRAVENOUS | Status: AC
  Administered 2018-12-24: 4 mg via INTRAVENOUS
  Filled 2018-12-24: qty 1

## 2018-12-24 MED ORDER — SODIUM CHLORIDE 0.9 % IV SOLN
Freq: Once | INTRAVENOUS | Status: AC
  Administered 2018-12-24: 18:00:00 via INTRAVENOUS

## 2018-12-24 MED ORDER — SODIUM CHLORIDE (PF) 0.9 % IJ SOLN
INTRAMUSCULAR | Status: AC
  Filled 2018-12-24: qty 50

## 2018-12-24 MED ORDER — METOCLOPRAMIDE HCL 5 MG/ML IJ SOLN
10.0000 mg | Freq: Once | INTRAMUSCULAR | Status: AC
  Administered 2018-12-24: 10 mg via INTRAVENOUS
  Filled 2018-12-24: qty 2

## 2018-12-24 MED ORDER — POTASSIUM CHLORIDE 10 MEQ/100ML IV SOLN
10.0000 meq | Freq: Once | INTRAVENOUS | Status: AC
  Administered 2018-12-24: 10 meq via INTRAVENOUS
  Filled 2018-12-24: qty 100

## 2018-12-24 MED ORDER — POTASSIUM CHLORIDE CRYS ER 20 MEQ PO TBCR: 20.0000 meq | EXTENDED_RELEASE_TABLET | Freq: Two times a day (BID) | ORAL | 0 refills | Status: DC

## 2018-12-24 MED ORDER — ONDANSETRON 4 MG PO TBDP: 4.0000 mg | ORAL_TABLET | ORAL | 0 refills | Status: DC | PRN

## 2018-12-24 MED ORDER — IOHEXOL 300 MG/ML  SOLN
100.0000 mL | Freq: Once | INTRAMUSCULAR | Status: AC | PRN
  Administered 2018-12-24: 100 mL via INTRAVENOUS

## 2018-12-24 MED ORDER — OMEPRAZOLE 20 MG PO CPDR: 20.0000 mg | DELAYED_RELEASE_CAPSULE | Freq: Every day | ORAL | 1 refills | Status: DC

## 2018-12-24 MED ORDER — THIAMINE HCL 100 MG/ML IJ SOLN
Freq: Once | INTRAVENOUS | Status: AC
  Administered 2018-12-24: 22:00:00 via INTRAVENOUS
  Filled 2018-12-24: qty 1000

## 2018-12-24 NOTE — ED Triage Notes (Addendum)
Per EMS-coming from home-abdominal pain which started yesterday-patient admitted to ETOH use and states her pain is due to use-want to be tested for COVID-ambulated to ambulance without difficulty-patient appears to be exaggerating symptoms

## 2018-12-24 NOTE — ED Notes (Signed)
CRITICAL VALUE STICKER  CRITICAL VALUE: Potassium 2.2  RECEIVER (on-site recipient of call): Mandy RN  [place] NOTIFIED: 12/24/18 1718  MESSENGER (representative from lab): Lab  MD NOTIFIED: Pfeiffer MD  TIME OF NOTIFICATION: 9558  RESPONSE: See orders

## 2018-12-24 NOTE — Discharge Instructions (Signed)
1.  Take Prilosec every day.  2.  Take Zofran as needed for nausea or vomiting. 3.  You need to start to cut back and quit drinking alcohol.  If you have problems with alcohol withdrawal he may have to stop gradually.  Return to the emergency department if you are having worsening symptoms. 4.  See your doctor as scheduled at the beginning of the week. 5.  You need to take 40 mEq of potassium daily for the next 3 days, you may then resume your normal dose.

## 2018-12-24 NOTE — Telephone Encounter (Signed)
New Messagse          Patient is having Sob and vomitting would like talk with a nurse

## 2018-12-24 NOTE — ED Provider Notes (Signed)
[facility] DEPT Provider Note   CSN: [id number] Arrival date & time: 12/24/18  1510    History   Chief Complaint No chief complaint on file.   HPI Rachael Stone is a 53 y.o. female.     HPI Patient reports he is having a lot of abdominal pain.  She reports that started yesterday.  She reports she does drink alcohol almost daily.  Reports she did have some today.  She reports the pain is burning and very severe with a lot of cramping as well.  She reports she has been vomiting but no blood in the vomit.  Ports she cannot stop dry heaving.  No fevers or chills.  No diarrhea.  No pain burning urgency with urination.  Patient reports she has a post to be taking potassium daily but has not been able to take it for a couple of days because of the vomiting and pain. Past Medical History:  Diagnosis Date   Back pain    Cardiomyopathy ([place])    a. 04/08/2016 Echo: EF 35%, diff HK, mild LVH, Gr2 DD, mod AI, mod to sev MR, mildly dil LA.   ETOH abuse    Hypertensive heart disease with heart failure (HCC)    Marijuana abuse    Moderate aortic insufficiency    a. 04/08/2016 Echo: mod AI.   Moderate to Severe Mitral Regurgitation    a. 04/08/2016 Echo: mod-sev MR directed centrally.   Tobacco abuse     Patient Active Problem List   Diagnosis Date Noted   BV (bacterial vaginosis) 01/26/2017   Trichomonal infection 01/26/2017   Essential hypertension 11/14/2016   Normal coronary arteries 04/11/2016   Hypertensive heart disease with heart failure (HCC)    NICM (nonischemic cardiomyopathy) ([place])    Nonrheumatic aortic valve insufficiency    Moderate to Severe Mitral Regurgitation    Tobacco abuse    Acute pulmonary edema ([place]) 04/08/2016   Elevated blood pressure 04/08/2016   Dyspnea    Acute combined systolic and diastolic heart failure ([place]) 04/07/2016   VAGINAL DISCHARGE 09/10/2009   CHEST PAIN UNSPECIFIED 08/13/2009    DEPRESSIVE DISORDER NOT ELSEWHERE CLASSIFIED 12/14/2008   MENORRHAGIA 12/14/2008   NAUSEA, CHRONIC 12/14/2008   ELEVATED BLOOD PRESSURE WITHOUT DIAGNOSIS OF HYPERTENSION 12/14/2008   DYSFUNCTIONAL UTERINE BLEEDING 11/12/2007   TOBACCO DEPENDENCE 09/10/2006   GASTROESOPHAGEAL REFLUX, NO ESOPHAGITIS 09/10/2006    Past Surgical History:  Procedure Laterality Date   CARDIAC CATHETERIZATION N/A 04/10/2016   Procedure: Right/Left Heart Cath and Coronary Angiography;  Surgeon: [name], MD;  Location: [facility];  Service: Cardiovascular;  Laterality: N/A;   EXPLORATORY LAPAROTOMY  2003   Ectopic Pregnancy - unsure which side or if tube was removed   TEE WITHOUT CARDIOVERSION N/A 06/26/2017   Procedure: TRANSESOPHAGEAL ECHOCARDIOGRAM (TEE);  Surgeon: [name], MD;  Location: [facility] ENDOSCOPY;  Service: Cardiovascular;  Laterality: N/A;   TUBAL LIGATION       OB History    Gravida  3   Para  2   Term  0   Preterm  0   AB  1   Living  2     SAB  0   TAB  0   Ectopic  1   Multiple  0   Live Births               Home Medications    Prior to Admission medications   Medication Sig Start Date  End Date Taking? Authorizing Provider  amLODipine (NORVASC) 10 MG tablet Take 1 tablet (10 mg total) by mouth daily. 11/26/18  Yes Kerin Perna, NP  aspirin 81 MG chewable tablet Chew 81 mg by mouth daily.   Yes [provider]  atorvastatin (LIPITOR) 40 MG tablet Take 1 tablet (40 mg total) by mouth daily. 11/26/18  Yes Kerin Perna, NP  carvedilol (COREG) 6.25 MG tablet Take 1 tablet (6.25 mg total) by mouth 2 (two) times daily. 11/26/18  Yes Kerin Perna, NP  furosemide (LASIX) 20 MG tablet Take 1 tablet (20 mg total) by mouth daily. 11/26/18  Yes Kerin Perna, NP  ibuprofen (ADVIL,MOTRIN) 200 MG tablet Take 400 mg by mouth every 6 (six) hours as needed for headache or mild pain.   Yes [provider]    losartan (COZAAR) 25 MG tablet Take 1 tablet (25 mg total) by mouth daily. 11/26/18  Yes Kerin Perna, NP  pantoprazole (PROTONIX) 20 MG tablet Take 1 tablet (20 mg total) by mouth daily. 11/26/18  Yes Kerin Perna, NP  Potassium Chloride ER 20 MEQ TBCR Take 1 tablet by mouth daily. 11/26/18  Yes Kerin Perna, NP  omeprazole (PRILOSEC) 20 MG capsule Take 1 capsule (20 mg total) by mouth daily. 12/24/18   Charlesetta Shanks, MD  ondansetron (ZOFRAN ODT) 4 MG disintegrating tablet Take 1 tablet (4 mg total) by mouth every 4 (four) hours as needed for nausea or vomiting. 12/24/18   Charlesetta Shanks, MD  potassium chloride SA (K-DUR) 20 MEQ tablet Take 1 tablet (20 mEq total) by mouth 2 (two) times daily. 12/24/18   Charlesetta Shanks, MD    Family History Family History  Problem Relation Age of Onset   CAD Mother        First MI @ 64 - 34 total.   Cirrhosis Father        alcoholic - died in his 78'I.   Lupus Sister     Social History Social History   Tobacco Use   Smoking status: Current Every Day Smoker    Packs/day: 0.50    Years: 15.00    Pack years: 7.50    Types: Cigarettes   Smokeless tobacco: Never Used  Substance Use Topics   Alcohol use: Yes    Comment: At least 12 ounces of homemade moon shine every other day.   Drug use: Yes    Types: Marijuana    Comment: Smokes marijuana daily.  Used to smoke cocaine laced  marijuana cigarettes for 15-20 yrs but quit cocaine 6 yrs ago.     Allergies   Ibuprofen   Review of Systems Review of Systems 10 Systems reviewed and are negative for acute change except as noted in the HPI.   Physical Exam Updated Vital Signs BP 129/68    Pulse 70    Temp 98.4 F (36.9 C) (Oral)    Resp 17    LMP 12/22/2018    SpO2 100%   Physical Exam Constitutional:      Comments: Patient is alert and nontoxic.  She has however tearful and writhing about and appears to be in a lot of pain.  No respiratory distress.  HENT:      Head: Normocephalic and atraumatic.     Mouth/Throat:     Mouth: Mucous membranes are moist.     Pharynx: Oropharynx is clear.  Eyes:     Extraocular Movements: Extraocular movements intact.  Cardiovascular:  Comments: Borderline tachycardia.  No gross rub murmur gallop. Pulmonary:     Effort: Pulmonary effort is normal.     Breath sounds: Normal breath sounds.  Abdominal:     Comments: Patient endorses severe pain to palpation in the epigastrium and upper quadrants.  Lower abdomen nontender.  Musculoskeletal: Normal range of motion.        General: No swelling or tenderness.     Right lower leg: No edema.     Left lower leg: No edema.  Skin:    General: Skin is warm and dry.  Neurological:     General: No focal deficit present.     Mental Status: She is oriented to person, place, and time.     Coordination: Coordination normal.  Psychiatric:     Comments: Patient is very agitated and tearful.      ED Treatments / Results  Labs (all labs ordered are listed, but only abnormal results are displayed) Labs Reviewed  LIPASE, BLOOD - Abnormal; Notable for the following components:      Result Value   Lipase 56 (*)    All other components within normal limits  COMPREHENSIVE METABOLIC PANEL - Abnormal; Notable for the following components:   Potassium 2.6 (*)    Glucose, Bld 136 (*)    All other components within normal limits  URINALYSIS, ROUTINE W REFLEX MICROSCOPIC - Abnormal; Notable for the following components:   Hgb urine dipstick MODERATE (*)    Bacteria, UA RARE (*)    All other components within normal limits  ETHANOL - Abnormal; Notable for the following components:   Alcohol, Ethyl (B) 120 (*)    All other components within normal limits  LACTIC ACID, PLASMA - Abnormal; Notable for the following components:   Lactic Acid, Venous 2.9 (*)    All other components within normal limits  RAPID URINE DRUG SCREEN, HOSP PERFORMED - Abnormal; Notable for the following  components:   Tetrahydrocannabinol POSITIVE (*)    All other components within normal limits  BLOOD GAS, VENOUS - Abnormal; Notable for the following components:   pH, Ven 7.504 (*)    pCO2, Ven 32.3 (*)    Acid-Base Excess 2.9 (*)    All other components within normal limits  POTASSIUM - Abnormal; Notable for the following components:   Potassium 2.8 (*)    All other components within normal limits  LACTIC ACID, PLASMA - Abnormal; Notable for the following components:   Lactic Acid, Venous 2.2 (*)    All other components within normal limits  CBC  PROTIME-INR  AMMONIA  BRAIN NATRIURETIC PEPTIDE  TROPONIN I  I-STAT BETA HCG BLOOD, ED ([facility], [facility], [facility] ONLY)    EKG EKG Interpretation  Date/Time:  Friday December 24 2018 16:40:47 EDT Ventricular Rate:  79 PR Interval:    QRS Duration: 99 QT Interval:  429 QTC Calculation: 492 R Axis:   51 Text Interpretation:  Sinus rhythm Probable left ventricular hypertrophy Borderline prolonged QT interval no sig change from old Confirmed by Charlesetta Shanks [phone]) on 12/24/2018 5:27:31 PM   Radiology Ct Abdomen Pelvis W Contrast  Result Date: 12/24/2018 CLINICAL DATA:  Pain EXAM: CT ABDOMEN AND PELVIS WITH CONTRAST TECHNIQUE: Multidetector CT imaging of the abdomen and pelvis was performed using the standard protocol following bolus administration of intravenous contrast. CONTRAST:  163mL OMNIPAQUE IOHEXOL 300 MG/ML  SOLN COMPARISON:  None. FINDINGS: Lower chest: No acute abnormality. Hepatobiliary: No focal liver abnormality is seen. There is hepatic steatosis. No  gallstones, gallbladder wall thickening, or biliary dilatation. Pancreas: Unremarkable. No pancreatic ductal dilatation or surrounding inflammatory changes. Spleen: Normal in size without focal abnormality. Adrenals/Urinary Tract: Adrenal glands are unremarkable. Kidneys are normal, without renal calculi, focal lesion, or hydronephrosis. Bladder is unremarkable. Stomach/Bowel: There is some  mild wall thickening of the colon which is felt to be secondary to underdistention. The stomach is unremarkable. The appendix is unremarkable. There is no evidence of a small-bowel obstruction. Vascular/Lymphatic: Aortic atherosclerosis. No enlarged abdominal or pelvic lymph nodes. Reproductive: There is a probable fibroid uterus. There is no discrete ovarian mass. Other: No abdominal wall hernia or abnormality. No abdominopelvic ascites. Musculoskeletal: No acute or significant osseous findings. IMPRESSION: 1. No acute abnormality. 2. Hepatic steatosis. 3.  Aortic Atherosclerosis (ICD10-I70.0). 4. Fibroid uterus. Electronically Signed   By: Constance Holster M.D.   On: 12/24/2018 21:51   Dg Chest Port 1 View  Result Date: 12/24/2018 CLINICAL DATA:  Shortness of breath. EXAM: PORTABLE CHEST 1 VIEW COMPARISON:  01/21/2018 FINDINGS: The cardiac silhouette is upper limits of normal in size, accentuated by portable AP technique. No airspace consolidation, edema, pleural effusion, pneumothorax is identified. No acute osseous abnormality is seen. IMPRESSION: No active disease. Electronically Signed   By: Logan Bores M.D.   On: 12/24/2018 17:09    Procedures Procedures (including critical care time)  Medications Ordered in ED Medications  sodium chloride (PF) 0.9 % injection (has no administration in time range)  morphine 4 MG/ML injection 4 mg (4 mg Intravenous Given 12/24/18 1730)  ondansetron (ZOFRAN) injection 4 mg (4 mg Intravenous Given 12/24/18 1730)  0.9 %  sodium chloride infusion ( Intravenous Stopped 12/24/18 1914)  pantoprazole (PROTONIX) injection 40 mg (40 mg Intravenous Given 12/24/18 1730)  potassium chloride 10 mEq in 100 mL IVPB (0 mEq Intravenous Stopped 12/24/18 1846)  potassium chloride SA (K-DUR) CR tablet 40 mEq (40 mEq Oral Given 12/24/18 1749)  potassium chloride SA (K-DUR) CR tablet 40 mEq (40 mEq Oral Given 12/24/18 2141)  potassium chloride 10 mEq in 100 mL IVPB ( Intravenous  Stopped 12/24/18 2249)  sodium chloride 0.9 % 1,000 mL with thiamine 301 mg, folic acid 1 mg, multivitamins adult 10 mL infusion ( Intravenous Rate/Dose Verify 12/24/18 2256)  morphine 4 MG/ML injection 4 mg (4 mg Intravenous Given 12/24/18 2050)  ondansetron (ZOFRAN) injection 4 mg (4 mg Intravenous Given 12/24/18 2050)  iohexol (OMNIPAQUE) 300 MG/ML solution 100 mL (100 mLs Intravenous Contrast Given 12/24/18 2122)     Initial Impression / Assessment and Plan / ED Course  I have reviewed the triage vital signs and the nursing notes.  Pertinent labs & imaging results that were available during my care of the patient were reviewed by me and considered in my medical decision making (see chart for details).       Arrived very distressed.  She is very tearful and anxious.  She complained of much abdominal pain and recurrent vomiting.  Treatment started with pain control and antiemetics.  Patient was hydrated.  Potassium replaced.  Her treatment and diagnostic evaluation, patient is much improved.  Is been able to start eating some crackers and sip fluids.  He is counseled on alcohol cessation and continuing her potassium with 40 mEq for the next 3 days then she can go back to baseline dosing.  She reports she has follow-up at the beginning of the week.  Final Clinical Impressions(s) / ED Diagnoses   Final diagnoses:  Acute alcoholic gastritis without hemorrhage  Hypokalemia due to excessive gastrointestinal loss of potassium  Non-intractable vomiting with nausea, unspecified vomiting type    ED Discharge Orders         Ordered    omeprazole (PRILOSEC) 20 MG capsule  Daily     12/24/18 2307    ondansetron (ZOFRAN ODT) 4 MG disintegrating tablet  Every 4 hours PRN     12/24/18 2307    potassium chloride SA (K-DUR) 20 MEQ tablet  2 times daily     12/24/18 2307           Charlesetta Shanks, MD 12/24/18 2316

## 2018-12-24 NOTE — Telephone Encounter (Signed)
Patient's sister's call was sent to triage. She stated patient is having SOB, perspiring and she is on her way to her house. She stated she had some vomiting and a rough night last night. Patient's sister (DPR) stated patient has not been doing well since [place] started. Informed her that if she is having trouble breathing that she needs to call 911. If her symptoms are not as severe she can go to the ED, but if she is having trouble catching her breath to call 911. Patient's sister stated she can hear that she is SOB on the phone, but patient states she is fine. Patient's sister stated she would tell her sister to go to the ED. Will send message to Dr. Acie Fredrickson, so he is aware.

## 2018-12-24 NOTE — ED Notes (Signed)
Pt in CT.

## 2018-12-24 NOTE — ED Notes (Signed)
Date and time results received: 12/24/18 6:17 PM  (use smartphrase ".now" to insert current time)  Test: lactic acid Critical Value: 2.9  Name of Provider Notified: mandy RN   Orders Received? Or Actions Taken?:

## 2018-12-24 NOTE — Telephone Encounter (Signed)
Agree with plan for Rachael Stone to go to the ER

## 2018-12-27 ENCOUNTER — Telehealth: Payer: Self-pay | Admitting: Cardiovascular Disease

## 2018-12-27 NOTE — Telephone Encounter (Signed)
Ms. Meller works for [employer] and is required to wear a face covering. They have supplied the employees with masks and she says she cannot breathe in the mask due to her leaky heart valve.  [employer] told her that if she has a doctor's note, they will provide her with a face shield instead.   She requests a call when the letter is written.

## 2018-12-27 NOTE — Telephone Encounter (Signed)
New message:    Patient calling stating that she need a note. Please call patient back at home. The note is for work concering a mask. 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 Please call patient.

## 2018-12-28 MED FILL — POTASSIUM CL ER 20 MEQ TAB: 20 | 30 days supply | Qty: 30 | Fill #1

## 2018-12-29 ENCOUNTER — Ambulatory Visit (INDEPENDENT_AMBULATORY_CARE_PROVIDER_SITE_OTHER): Payer: Medicaid Other | Admitting: Primary Care

## 2018-12-29 ENCOUNTER — Other Ambulatory Visit: Payer: Self-pay

## 2018-12-29 ENCOUNTER — Encounter (INDEPENDENT_AMBULATORY_CARE_PROVIDER_SITE_OTHER): Payer: Self-pay | Admitting: Primary Care

## 2018-12-29 DIAGNOSIS — F1010 Alcohol abuse, uncomplicated: Secondary | ICD-10-CM

## 2018-12-29 DIAGNOSIS — F1020 Alcohol dependence, uncomplicated: Secondary | ICD-10-CM | POA: Insufficient documentation

## 2018-12-29 DIAGNOSIS — E876 Hypokalemia: Secondary | ICD-10-CM | POA: Insufficient documentation

## 2018-12-29 DIAGNOSIS — K219 Gastro-esophageal reflux disease without esophagitis: Secondary | ICD-10-CM

## 2018-12-29 DIAGNOSIS — Z09 Encounter for follow-up examination after completed treatment for conditions other than malignant neoplasm: Secondary | ICD-10-CM

## 2018-12-29 MED FILL — ONDANSETRON ODT 4 MG TABLET: 4 | 3 days supply | Qty: 20 | Fill #0

## 2018-12-29 NOTE — Progress Notes (Signed)
Leg pain is better since taking potassium

## 2018-12-29 NOTE — Progress Notes (Signed)
Virtual Visit via Telephone Note  I connected with Rachael Stone on 12/29/18 at 11:10 AM EDT by telephone and verified that I am speaking with the correct person using two identifiers.   I discussed the limitations, risks, security and privacy concerns of performing an evaluation and management service by telephone and the availability of in person appointments. I also discussed with the patient that there may be a patient responsible charge related to this service. The patient expressed understanding and agreed to proceed.   History of Present Illness: Rachael Stone was being seen today hospital follow-up on December 24, 2018 chief complaint was abdominal pain.  Diagnosis was acute alcoholic gastritis without hemorrhage.  Today her concern is abdominal pain and the Protonix was not helping nor was GERD a complaint.  Then patient question about potassium in the size of the pills.  Note discussion in assessment and plan.   Observations/Objective: Review of Systems  Respiratory: Positive for shortness of breath.   Gastrointestinal: Positive for abdominal pain, heartburn and nausea.  Psychiatric/Behavioral: Positive for substance abuse.  All other systems reviewed and are negative.    Assessment and Plan: Rachael Stone was seen today for shortness of breath and pain.  Diagnoses and all orders for this visit:  Gastroesophageal reflux disease without esophagitis Probably associated gastritis with alcohol use.  Protonix is used for heartburn.  Explained to patient abdominal pain is associated with consumption of alcohol per patient she stopped drinking today.  ETOH abuse Alcohol abuse is been a chronic problem as noted in ED visit acute alcoholic gastritis, symptoms are similar spasms intermittently, nausea, without emesis and burning sensation.  Previously noted she drinks a sixpack of beer daily as of today per patient she has stopped.  Hospital discharge follow-up ED visit on Protonix as noted in  ED visit acute alcoholic gastritis ED visit December 24, 4194 for acute aholic gastritis.  CT of the abdomen revealed steatosis, gallbladder wall thickening or biliary dilatation, pancreas unremarkable spleen without focal abnormality urinary tract kidneys without renal calculi or focal lesions.  Stomach and bowel revealed mild wall thickening of the colon which is felt to be secondary to underdistention.  The appendix and stomach were unremarkable and no evidence of small bowel obstruction.  Note there are probable fibroid  In uterus.  Hypokalemia Continue to take K+ supplement and foods listed were discussed to add to diet to increase K+ Bananas, oranges, cantaloupe, honeydew, apricots, grapefruit (some dried fruits, such as prunes, raisins, and dates, are also high in potassium)Cooked spinach.,Cooked broccoli.Potatoes.Sweet potatoes.Mushrooms.Peas and Cucumbers  Follow Up Instructions:    I discussed the assessment and treatment plan with the patient. The patient was provided an opportunity to ask questions and all were answered. The patient agreed with the plan and demonstrated an understanding of the instructions.   The patient was advised to call back or seek an in-person evaluation if the symptoms worsen or if the condition fails to improve as anticipated.  I provided  minutes of 35 non-face-to-face time during this encounter. This includes chart review , labs and imaging and extensive discussion on diet, alcohol abuse.   Rachael Perna, NP

## 2018-12-31 NOTE — Telephone Encounter (Signed)
Pt confirms her work received faxed letter.

## 2019-01-05 MED FILL — ?FUROSEMIDE 20 MG TABLET: 20 | 30 days supply | Qty: 30 | Fill #1

## 2019-01-05 MED FILL — LOSARTAN POTASSIUM 25 MG TA: 25 | 30 days supply | Qty: 30 | Fill #1

## 2019-01-05 MED FILL — ?AMLODIPINE BESYLATE 10 MG: 10 | 30 days supply | Qty: 30 | Fill #1

## 2019-01-10 ENCOUNTER — Ambulatory Visit (HOSPITAL_COMMUNITY)
Admission: EM | Admit: 2019-01-10 | Discharge: 2019-01-10 | Disposition: A | Discharge status: Home / Self Care | Payer: Medicaid Other | Attending: Family Medicine | Admitting: Family Medicine

## 2019-01-10 ENCOUNTER — Encounter (HOSPITAL_COMMUNITY): Payer: Self-pay | Admitting: Emergency Medicine

## 2019-01-10 ENCOUNTER — Other Ambulatory Visit: Payer: Self-pay

## 2019-01-10 DIAGNOSIS — E876 Hypokalemia: Secondary | ICD-10-CM | POA: Insufficient documentation

## 2019-01-10 DIAGNOSIS — Z7982 Long term (current) use of aspirin: Secondary | ICD-10-CM | POA: Insufficient documentation

## 2019-01-10 DIAGNOSIS — Z8249 Family history of ischemic heart disease and other diseases of the circulatory system: Secondary | ICD-10-CM | POA: Insufficient documentation

## 2019-01-10 DIAGNOSIS — R112 Nausea with vomiting, unspecified: Secondary | ICD-10-CM | POA: Insufficient documentation

## 2019-01-10 DIAGNOSIS — R630 Anorexia: Secondary | ICD-10-CM | POA: Insufficient documentation

## 2019-01-10 DIAGNOSIS — W57XXXA Bitten or stung by nonvenomous insect and other nonvenomous arthropods, initial encounter: Secondary | ICD-10-CM | POA: Insufficient documentation

## 2019-01-10 DIAGNOSIS — I110 Hypertensive heart disease with heart failure: Secondary | ICD-10-CM | POA: Insufficient documentation

## 2019-01-10 DIAGNOSIS — F329 Major depressive disorder, single episode, unspecified: Secondary | ICD-10-CM | POA: Insufficient documentation

## 2019-01-10 DIAGNOSIS — L03031 Cellulitis of right toe: Secondary | ICD-10-CM

## 2019-01-10 DIAGNOSIS — I429 Cardiomyopathy, unspecified: Secondary | ICD-10-CM | POA: Insufficient documentation

## 2019-01-10 DIAGNOSIS — Z79899 Other long term (current) drug therapy: Secondary | ICD-10-CM | POA: Insufficient documentation

## 2019-01-10 DIAGNOSIS — K219 Gastro-esophageal reflux disease without esophagitis: Secondary | ICD-10-CM | POA: Insufficient documentation

## 2019-01-10 DIAGNOSIS — I340 Nonrheumatic mitral (valve) insufficiency: Secondary | ICD-10-CM | POA: Insufficient documentation

## 2019-01-10 DIAGNOSIS — R1084 Generalized abdominal pain: Secondary | ICD-10-CM | POA: Insufficient documentation

## 2019-01-10 DIAGNOSIS — Z131 Encounter for screening for diabetes mellitus: Secondary | ICD-10-CM

## 2019-01-10 DIAGNOSIS — N920 Excessive and frequent menstruation with regular cycle: Secondary | ICD-10-CM | POA: Insufficient documentation

## 2019-01-10 DIAGNOSIS — L03032 Cellulitis of left toe: Secondary | ICD-10-CM

## 2019-01-10 DIAGNOSIS — M79676 Pain in unspecified toe(s): Secondary | ICD-10-CM | POA: Insufficient documentation

## 2019-01-10 DIAGNOSIS — Z1389 Encounter for screening for other disorder: Secondary | ICD-10-CM

## 2019-01-10 DIAGNOSIS — F17210 Nicotine dependence, cigarettes, uncomplicated: Secondary | ICD-10-CM | POA: Insufficient documentation

## 2019-01-10 DIAGNOSIS — R21 Rash and other nonspecific skin eruption: Secondary | ICD-10-CM | POA: Insufficient documentation

## 2019-01-10 DIAGNOSIS — Z7901 Long term (current) use of anticoagulants: Secondary | ICD-10-CM | POA: Insufficient documentation

## 2019-01-10 LAB — GLUCOSE, CAPILLARY: Glucose-Capillary: 93 mg/dL (ref 70–99)

## 2019-01-10 LAB — HEMOGLOBIN A1C
Hgb A1c MFr Bld: 5.3 % (ref 4.8–5.6)
Mean Plasma Glucose: 105.41 mg/dL

## 2019-01-10 LAB — POCT URINALYSIS DIP (DEVICE)
Glucose, UA: NEGATIVE mg/dL
Hgb urine dipstick: NEGATIVE
Ketones, ur: 40 mg/dL — AB
Leukocytes,Ua: NEGATIVE
Nitrite: NEGATIVE
Protein, ur: NEGATIVE mg/dL
Specific Gravity, Urine: 1.025 (ref 1.005–1.030)
Urobilinogen, UA: 0.2 mg/dL (ref 0.0–1.0)
pH: 5.5 (ref 5.0–8.0)

## 2019-01-10 LAB — CBC
HCT: 46.2 % — ABNORMAL HIGH (ref 36.0–46.0)
Hemoglobin: 15.4 g/dL — ABNORMAL HIGH (ref 12.0–15.0)
MCH: 29.4 pg (ref 26.0–34.0)
MCHC: 33.3 g/dL (ref 30.0–36.0)
MCV: 88.3 fL (ref 80.0–100.0)
Platelets: 341 10*3/uL (ref 150–400)
RBC: 5.23 MIL/uL — ABNORMAL HIGH (ref 3.87–5.11)
RDW: 13.8 % (ref 11.5–15.5)
WBC: 11 10*3/uL — ABNORMAL HIGH (ref 4.0–10.5)
nRBC: 0 % (ref 0.0–0.2)

## 2019-01-10 LAB — COMPREHENSIVE METABOLIC PANEL
ALT: 30 U/L (ref 0–44)
AST: 23 U/L (ref 15–41)
Albumin: 4.2 g/dL (ref 3.5–5.0)
Alkaline Phosphatase: 62 U/L (ref 38–126)
Anion gap: 13 (ref 5–15)
BUN: 15 mg/dL (ref 6–20)
CO2: 24 mmol/L (ref 22–32)
Calcium: 10.1 mg/dL (ref 8.9–10.3)
Chloride: 98 mmol/L (ref 98–111)
Creatinine, Ser: 0.85 mg/dL (ref 0.44–1.00)
GFR calc Af Amer: >60 mL/min (ref >60)
GFR calc non Af Amer: >60 mL/min (ref >60)
Glucose, Bld: 99 mg/dL (ref 70–99)
Potassium: 3.9 mmol/L (ref 3.5–5.1)
Sodium: 135 mmol/L (ref 135–145)
Total Bilirubin: 0.9 mg/dL (ref 0.3–1.2)
Total Protein: 8.5 g/dL — ABNORMAL HIGH (ref 6.5–8.1)

## 2019-01-10 LAB — LIPASE, BLOOD: Lipase: 84 U/L — ABNORMAL HIGH (ref 11–51)

## 2019-01-10 MED ORDER — CEPHALEXIN 500 MG PO CAPS: 500.0000 mg | ORAL_CAPSULE | Freq: Four times a day (QID) | ORAL | 0 refills | Status: AC

## 2019-01-10 MED ORDER — ONDANSETRON 4 MG PO TBDP: 4.0000 mg | ORAL_TABLET | Freq: Three times a day (TID) | ORAL | 0 refills | Status: DC | PRN

## 2019-01-10 MED ORDER — TRIAMCINOLONE ACETONIDE 0.1 % EX CREA: 1.0000 "application " | TOPICAL_CREAM | Freq: Two times a day (BID) | CUTANEOUS | 0 refills | Status: DC

## 2019-01-10 MED ORDER — OMEPRAZOLE 20 MG PO CPDR: 20.0000 mg | DELAYED_RELEASE_CAPSULE | Freq: Every day | ORAL | 0 refills | Status: DC

## 2019-01-10 MED FILL — ?OMEPRAZOLE 20 MG CAPSULE D: 20 | 20 days supply | Qty: 20 | Fill #0

## 2019-01-10 MED FILL — ?TRIAMCINOLONE 0.1% CRM: 0.1 | 15 days supply | Qty: 30 | Fill #0

## 2019-01-10 MED FILL — ONDANSETRON ODT 4 MG TABLET: 4 | 6 days supply | Qty: 20 | Fill #0

## 2019-01-10 MED FILL — CEPHALEXIN 500 MG CAPSULE: 500 | 5 days supply | Qty: 20 | Fill #0

## 2019-01-10 NOTE — Discharge Instructions (Signed)
Your blood sugar was 93 and there was no signs of glucose in your urine which is reassuring We also checking your A1c  I am checking your electrolytes, kidney function, white blood count, I will call if any of these are abnormal that would indicate you may need to go to emergency room.  Please continue to use omeprazole/Prilosec daily over the next 2 weeks Please use Zofran as needed for nausea Please drink plenty of fluids in order to stay hydrated  Begin taking Keflex to cover for any infection around these bug bites May apply triamcinolone twice daily to these areas to help with any itching

## 2019-01-10 NOTE — ED Triage Notes (Signed)
Pt here for left great toe pain x 4 days

## 2019-01-10 NOTE — ED Provider Notes (Signed)
Rachael Stone    CSN: [id number] Arrival date & time: 01/10/19  1152     History   Chief Complaint Chief Complaint  Patient presents with  . Toe Pain    HPI Rachael Stone is a 53 y.o. female history of previous tobacco, marijuana use, hypertension, presenting today for evaluation of possible toe infection as well as concern over nausea and vomiting.  Patient states that over the weekend she has had persistent nausea with a few episodes of vomiting.  She has had poor appetite and has not eaten much over the past few days.  She has had some intermittent abdominal discomfort associated with this.  She denies abdominal pain at time of visit.  She denies any fevers chills or body aches.  Has had some looser stools.  Denies blood in the stool.  Denies recent travel.  She denies associated chest pain or shortness of breath.  Denies associated URI symptoms of congestion cough or sore throat.  Denies any known exposure to COVID.  She does endorse some symptoms of reflux including indigestion.  She is attempted to take 2 tablets of Prilosec.  She is not taking any other medicines for her symptoms.  She expresses concern over diabetes and wishes to be checked for this.  Denies urinary symptoms of dysuria, increased frequency or urgency.  She denies abnormal vaginal discharge or pelvic pain.  She also notes that over the past couple days she has noticed red spots to her feet and is concerned about bug bites.  She has associated itching, but also some mild pain.  Denies numbness or tingling.  She has not put anything on these areas.  Lab Results  Component Value Date   HGBA1C 5.3 01/10/2019     HPI  Past Medical History:  Diagnosis Date  . Back pain   . Cardiomyopathy ([place])    a. 04/08/2016 Echo: EF 35%, diff HK, mild LVH, Gr2 DD, mod AI, mod to sev MR, mildly dil LA.  [name] ETOH abuse   . Hypertensive heart disease with heart failure ([place])   . Marijuana abuse   . Moderate aortic  insufficiency    a. 04/08/2016 Echo: mod AI.  [name] Moderate to Severe Mitral Regurgitation    a. 04/08/2016 Echo: mod-sev MR directed centrally.  . Tobacco abuse     Patient Active Problem List   Diagnosis Date Noted  . ETOH abuse   . Hypokalemia   . Hospital discharge follow-up   . BV (bacterial vaginosis) 01/26/2017  . Trichomonal infection 01/26/2017  . Essential hypertension 11/14/2016  . Normal coronary arteries 04/11/2016  . Hypertensive heart disease with heart failure ([place])   . NICM (nonischemic cardiomyopathy) ([place])   . Nonrheumatic aortic valve insufficiency   . Moderate to Severe Mitral Regurgitation   . Tobacco abuse   . Acute pulmonary edema ([place]) 04/08/2016  . Elevated blood pressure 04/08/2016  . Dyspnea   . Acute combined systolic and diastolic heart failure ([place]) 04/07/2016  . VAGINAL DISCHARGE 09/10/2009  . CHEST PAIN UNSPECIFIED 08/13/2009  . DEPRESSIVE DISORDER NOT ELSEWHERE CLASSIFIED 12/14/2008  . MENORRHAGIA 12/14/2008  . NAUSEA, CHRONIC 12/14/2008  . ELEVATED BLOOD PRESSURE WITHOUT DIAGNOSIS OF HYPERTENSION 12/14/2008  . DYSFUNCTIONAL UTERINE BLEEDING 11/12/2007  . TOBACCO DEPENDENCE 09/10/2006  . GASTROESOPHAGEAL REFLUX, NO ESOPHAGITIS 09/10/2006    Past Surgical History:  Procedure Laterality Date  . CARDIAC CATHETERIZATION N/A 04/10/2016   Procedure: Right/Left Heart Cath and Coronary Angiography;  Surgeon: [name], MD;  Location: [facility];  Service: Cardiovascular;  Laterality: N/A;  . EXPLORATORY LAPAROTOMY  2003   Ectopic Pregnancy - unsure which side or if tube was removed  . TEE WITHOUT CARDIOVERSION N/A 06/26/2017   Procedure: TRANSESOPHAGEAL ECHOCARDIOGRAM (TEE);  Surgeon: Acie Fredrickson, Wonda Cheng, MD;  Location: [facility] ENDOSCOPY;  Service: Cardiovascular;  Laterality: N/A;  . TUBAL LIGATION      OB History    Gravida  3   Para  2   Term  0   Preterm  0   AB  1   Living  2     SAB  0   TAB  0   Ectopic  1    Multiple  0   Live Births               Home Medications    Prior to Admission medications   Medication Sig Start Date End Date Taking? Authorizing Provider  amLODipine (NORVASC) 10 MG tablet Take 1 tablet (10 mg total) by mouth daily. 11/26/18   Kerin Perna, NP  aspirin 81 MG chewable tablet Chew 81 mg by mouth daily.    [provider]  atorvastatin (LIPITOR) 40 MG tablet Take 1 tablet (40 mg total) by mouth daily. 11/26/18   Kerin Perna, NP  carvedilol (COREG) 6.25 MG tablet Take 1 tablet (6.25 mg total) by mouth 2 (two) times daily. 11/26/18   Kerin Perna, NP  cephALEXin (KEFLEX) 500 MG capsule Take 1 capsule (500 mg total) by mouth 4 (four) times daily for 5 days. 01/10/19 01/15/19  Kaeya Schiffer C, PA-C  furosemide (LASIX) 20 MG tablet Take 1 tablet (20 mg total) by mouth daily. 11/26/18   Kerin Perna, NP  ibuprofen (ADVIL,MOTRIN) 200 MG tablet Take 400 mg by mouth every 6 (six) hours as needed for headache or mild pain.    [provider]  losartan (COZAAR) 25 MG tablet Take 1 tablet (25 mg total) by mouth daily. 11/26/18   Kerin Perna, NP  omeprazole (PRILOSEC) 20 MG capsule Take 1 capsule (20 mg total) by mouth daily. 01/10/19   Sierria Bruney C, PA-C  ondansetron (ZOFRAN ODT) 4 MG disintegrating tablet Take 1 tablet (4 mg total) by mouth every 8 (eight) hours as needed for nausea or vomiting. 01/10/19   Francess Mullen C, PA-C  pantoprazole (PROTONIX) 20 MG tablet Take 1 tablet (20 mg total) by mouth daily. Patient not taking: Reported on 12/29/2018 11/26/18   Kerin Perna, NP  Potassium Chloride ER 20 MEQ TBCR Take 1 tablet by mouth daily. 11/26/18   Kerin Perna, NP  potassium chloride SA (K-DUR) 20 MEQ tablet Take 1 tablet (20 mEq total) by mouth 2 (two) times daily. Patient not taking: Reported on 12/29/2018 12/24/18   Charlesetta Shanks, MD  triamcinolone cream (KENALOG) 0.1 % Apply 1 application topically 2 (two)  times daily. 01/10/19   Hadlyn Amero, Elesa Hacker, PA-C    Family History Family History  Problem Relation Age of Onset  . CAD Mother        First MI @ 9 - 34 total.  . Cirrhosis Father        alcoholic - died in his 93'Z.  . Lupus Sister     Social History Social History   Tobacco Use  . Smoking status: Current Every Day Smoker    Packs/day: 0.50    Years: 15.00    Pack years: 7.50    Types: Cigarettes  .  Smokeless tobacco: Never Used  Substance Use Topics  . Alcohol use: Yes    Comment: At least 12 ounces of homemade moon shine every other day.  . Drug use: Yes    Types: Marijuana    Comment: Smokes marijuana daily.  Used to smoke cocaine laced  marijuana cigarettes for 15-20 yrs but quit cocaine 6 yrs ago.     Allergies   Ibuprofen   Review of Systems Review of Systems  Constitutional: Negative for fatigue and fever.  HENT: Negative for congestion, sinus pressure and sore throat.   Eyes: Negative for photophobia, pain and visual disturbance.  Respiratory: Negative for cough and shortness of breath.   Cardiovascular: Negative for chest pain.  Gastrointestinal: Positive for abdominal pain, diarrhea, nausea and vomiting.  Genitourinary: Negative for decreased urine volume and hematuria.  Musculoskeletal: Negative for myalgias, neck pain and neck stiffness.  Skin: Positive for color change and rash.  Neurological: Negative for dizziness, syncope, facial asymmetry, speech difficulty, weakness, light-headedness, numbness and headaches.     Physical Exam Triage Vital Signs ED Triage Vitals [01/10/19 1259]  Enc Vitals Group     BP (!) 158/80     Pulse Rate 85     Resp 18     Temp 98.9 F (37.2 C)     Temp Source Oral     SpO2 98 %     Weight      Height      Head Circumference      Peak Flow      Pain Score      Pain Loc      Pain Edu?      Excl. in [place]?    No data found.  Updated Vital Signs BP (!) 158/80 (BP Location: Right Arm)   Pulse 85   Temp 98.9 F  (37.2 C) (Oral)   Resp 18   LMP 12/22/2018   SpO2 98%   Visual Acuity Right Eye Distance:   Left Eye Distance:   Bilateral Distance:    Right Eye Near:   Left Eye Near:    Bilateral Near:     Physical Exam Vitals signs and nursing note reviewed.  Constitutional:      General: She is not in acute distress.    Appearance: She is well-developed.     Comments: Patient initially tearful at beginning of visit, but no acute distress, by end of visit patient appeared to be in better spirits  HENT:     Head: Normocephalic and atraumatic.     Ears:     Comments: Bilateral ears without tenderness to palpation of external auricle, tragus and mastoid, EAC's without erythema or swelling, TM's with good bony landmarks and cone of light. Non erythematous.     Mouth/Throat:     Comments: Oral mucosa pink and moist, no tonsillar enlargement or exudate. Posterior pharynx patent and nonerythematous, no uvula deviation or swelling. Normal phonation.  Eyes:     Conjunctiva/sclera: Conjunctivae normal.  Neck:     Musculoskeletal: Neck supple.  Cardiovascular:     Rate and Rhythm: Normal rate and regular rhythm.     Heart sounds: No murmur.  Pulmonary:     Effort: Pulmonary effort is normal. No respiratory distress.     Breath sounds: Normal breath sounds.     Comments: Breathing comfortably at rest, CTABL, no wheezing, rales or other adventitious sounds auscultated  Abdominal:     Palpations: Abdomen is soft.     Tenderness: There  is no abdominal tenderness.     Comments: Abdomen soft, nondistended, nontender to light and deep palpation throughout entire abdomen and epigastrium  Skin:    General: Skin is warm and dry.     Comments: Multiple erythematous small circular areas noted to dorsum of feet, toes, minimally tender, no pain swelling or erythema noted around nailbed  Neurological:     General: No focal deficit present.     Mental Status: She is alert and oriented to person, place, and  time.      UC Treatments / Results  Labs (all labs ordered are listed, but only abnormal results are displayed) Labs Reviewed  COMPREHENSIVE METABOLIC PANEL - Abnormal; Notable for the following components:      Result Value   Total Protein 8.5 (*)    All other components within normal limits  CBC - Abnormal; Notable for the following components:   WBC 11.0 (*)    RBC 5.23 (*)    Hemoglobin 15.4 (*)    HCT 46.2 (*)    All other components within normal limits  LIPASE, BLOOD - Abnormal; Notable for the following components:   Lipase 84 (*)    All other components within normal limits  POCT URINALYSIS DIP (DEVICE) - Abnormal; Notable for the following components:   Bilirubin Urine MODERATE (*)    Ketones, ur 40 (*)    All other components within normal limits  HEMOGLOBIN A1C  GLUCOSE, CAPILLARY  CBG MONITORING, ED     Results for orders placed or performed during the hospital encounter of 01/10/19 (from the past 24 hour(s))  POCT urinalysis dip (device)     Status: Abnormal   Collection Time: 01/10/19  1:28 PM  Result Value Ref Range   Glucose, UA NEGATIVE NEGATIVE mg/dL   Bilirubin Urine MODERATE (A) NEGATIVE   Ketones, ur 40 (A) NEGATIVE mg/dL   Specific Gravity, Urine 1.025 1.005 - 1.030   Hgb urine dipstick NEGATIVE NEGATIVE   pH 5.5 5.0 - 8.0   Protein, ur NEGATIVE NEGATIVE mg/dL   Urobilinogen, UA 0.2 0.0 - 1.0 mg/dL   Nitrite NEGATIVE NEGATIVE   Leukocytes,Ua NEGATIVE NEGATIVE  Comprehensive metabolic panel     Status: Abnormal   Collection Time: 01/10/19  1:30 PM  Result Value Ref Range   Sodium 135 135 - 145 mmol/L   Potassium 3.9 3.5 - 5.1 mmol/L   Chloride 98 98 - 111 mmol/L   CO2 24 22 - 32 mmol/L   Glucose, Bld 99 70 - 99 mg/dL   BUN 15 6 - 20 mg/dL   Creatinine, Ser 0.85 0.44 - 1.00 mg/dL   Calcium 10.1 8.9 - 10.3 mg/dL   Total Protein 8.5 (H) 6.5 - 8.1 g/dL   Albumin 4.2 3.5 - 5.0 g/dL   AST 23 15 - 41 U/L   ALT 30 0 - 44 U/L   Alkaline  Phosphatase 62 38 - 126 U/L   Total Bilirubin 0.9 0.3 - 1.2 mg/dL   GFR calc non Af Amer >60 >60 mL/min   GFR calc Af Amer >60 >60 mL/min   Anion gap 13 5 - 15  CBC     Status: Abnormal   Collection Time: 01/10/19  1:30 PM  Result Value Ref Range   WBC 11.0 (H) 4.0 - 10.5 K/uL   RBC 5.23 (H) 3.87 - 5.11 MIL/uL   Hemoglobin 15.4 (H) 12.0 - 15.0 g/dL   HCT 46.2 (H) 36.0 - 46.0 %   MCV 88.3  80.0 - 100.0 fL   MCH 29.4 26.0 - 34.0 pg   MCHC 33.3 30.0 - 36.0 g/dL   RDW 13.8 11.5 - 15.5 %   Platelets 341 150 - 400 K/uL   nRBC 0.0 0.0 - 0.2 %  Lipase, blood     Status: Abnormal   Collection Time: 01/10/19  1:30 PM  Result Value Ref Range   Lipase 84 (H) 11 - 51 U/L  Hemoglobin A1c     Status: None   Collection Time: 01/10/19  1:30 PM  Result Value Ref Range   Hgb A1c MFr Bld 5.3 4.8 - 5.6 %   Mean Plasma Glucose 105.41 mg/dL  Glucose, capillary     Status: None   Collection Time: 01/10/19  1:33 PM  Result Value Ref Range   Glucose-Capillary 93 70 - 99 mg/dL    EKG None  Radiology No results found.  Procedures Procedures (including critical care time)  Medications Ordered in UC Medications - No data to display  Initial Impression / Assessment and Plan / UC Course  I have reviewed the triage vital signs and the nursing notes.  Pertinent labs & imaging results that were available during my care of the patient were reviewed by me and considered in my medical decision making (see chart for details).     Areas on legs suggestive of bug bites.  Do not appear significantly concerning for infection at this time, but regarding comfort with Keflex.  Also provide triamcinolone cream to help with any itching and local inflammation.  Monitor for resolution.  Blood sugar 93 in clinic, no glucose in urine, do not suspect worsening of diabetes.  A1c went ahead and obtained to update in chart as last result from 2018.  5.3.  UA with 40 ketones, moderate bilirubin.  No signs of infection.   Ketones may be from some slight dehydration due to decreased oral intake of recently.  Without abdominal pain at time of visit, negative peritoneal signs, does not seem to warrant emergent evaluation for abdominal emergency.  GI symptoms likely viral, but will check CBC, CMP and lipase.  Will call patient with results and further recommendations.  In the interim we will treat symptomatically and supportively, continue to take omeprazole daily to treat any underlying by reflux/gastritis.  Zofran as needed for nausea.  Called patient to discuss results, electrolytes stable, no elevations of LFTs, kidney function normal.  Does have slightly elevated white count, just outside of upper limit.  Lipase 84.  Discussed with patient this was elevated, given patient's presentation earlier today and lab work, recommended continued symptomatic and supportive care pushing fluids.  If developing any worsening abdominal pain or worsening of symptoms to follow-up in emergency room for further evaluation.  At this time do not suspect acute pancreatitis, but will recommend to push fluids.  Discussed strict return precautions. Patient verbalized understanding and is agreeable with plan.  Final Clinical Impressions(s) / UC Diagnoses   Final diagnoses:  Nausea and vomiting, intractability of vomiting not specified, unspecified vomiting type  Generalized abdominal pain  Bug bite, initial encounter     Discharge Instructions     Your blood sugar was 93 and there was no signs of glucose in your urine which is reassuring We also checking your A1c  I am checking your electrolytes, kidney function, white blood count, I will call if any of these are abnormal that would indicate you may need to go to emergency room.  Please continue to use  omeprazole/Prilosec daily over the next 2 weeks Please use Zofran as needed for nausea Please drink plenty of fluids in order to stay hydrated  Begin taking Keflex to cover for any  infection around these bug bites May apply triamcinolone twice daily to these areas to help with any itching    ED Prescriptions    Medication Sig Dispense Auth. Provider   ondansetron (ZOFRAN ODT) 4 MG disintegrating tablet Take 1 tablet (4 mg total) by mouth every 8 (eight) hours as needed for nausea or vomiting. 20 tablet Dayveon Halley C, PA-C   cephALEXin (KEFLEX) 500 MG capsule Take 1 capsule (500 mg total) by mouth 4 (four) times daily for 5 days. 20 capsule Yazmeen Woolf C, PA-C   omeprazole (PRILOSEC) 20 MG capsule Take 1 capsule (20 mg total) by mouth daily. 20 capsule Shizue Kaseman C, PA-C   triamcinolone cream (KENALOG) 0.1 % Apply 1 application topically 2 (two) times daily. 30 g Tailynn Armetta, [place] C, PA-C     Controlled Substance Prescriptions  Controlled Substance Registry consulted? Not Applicable   Janith Lima, [place] 01/10/19 1932

## 2019-01-18 ENCOUNTER — Encounter (INDEPENDENT_AMBULATORY_CARE_PROVIDER_SITE_OTHER): Payer: Self-pay | Admitting: Primary Care

## 2019-01-18 ENCOUNTER — Other Ambulatory Visit: Payer: Self-pay

## 2019-01-18 ENCOUNTER — Ambulatory Visit (INDEPENDENT_AMBULATORY_CARE_PROVIDER_SITE_OTHER): Payer: Medicaid Other | Admitting: Primary Care

## 2019-01-18 DIAGNOSIS — F1020 Alcohol dependence, uncomplicated: Secondary | ICD-10-CM

## 2019-01-18 DIAGNOSIS — Z09 Encounter for follow-up examination after completed treatment for conditions other than malignant neoplasm: Secondary | ICD-10-CM

## 2019-01-18 NOTE — Progress Notes (Signed)
Pt reports feeling better since presenting to urgent care last week No symptoms

## 2019-01-18 NOTE — Progress Notes (Signed)
Virtual Visit via Telephone Note  I connected with Rachael Stone on 01/18/19 at 10:30 AM EDT by telephone and verified that I am speaking with the correct person using two identifiers.   I discussed the limitations, risks, security and privacy concerns of performing an evaluation and management service by telephone and the availability of in person appointments. I also discussed with the patient that there may be a patient responsible charge related to this service. The patient expressed understanding and agreed to proceed.   History of Present Illness: Rachael Stone is a hospital follow up for nausea and vomiting.[id number] tobacco, marijuana use, and hypertension.  Denies shortness of breath, headaches, chest pain or lower extremity edema. Patient feels a lot better and all symptoms are resolved.  Observations/Objective: Review of Systems  Cardiovascular: Positive for leg swelling.       Wears compression stocking for work   Assessment and Plan: Diagnoses and all orders for this visit:  Hospital discharge follow-up All complaints and concerns has resolved . She has completed her antibiotics and feeling better.   Alcoholism Primary causative factor of chronic abdominal pain and eleved liver enzymes. Patient acknowledges this is a problem and when she make a commitment to stop will provide couseling with clinical case manger to organize a rehab facility.  Follow Up Instructions:    I discussed the assessment and treatment plan with the patient. The patient was provided an opportunity to ask questions and all were answered. The patient agreed with the plan and demonstrated an understanding of the instructions.   The patient was advised to call back or seek an in-person evaluation if the symptoms worsen or if the condition fails to improve as anticipated.  I provided 10 minutes  of non-face-to-face time during this encounter.   Kerin Perna, NP

## 2019-01-24 MED FILL — POTASSIUM CL ER 20 MEQ TAB: 20 | 30 days supply | Qty: 30 | Fill #2

## 2019-02-16 ENCOUNTER — Other Ambulatory Visit: Payer: Self-pay

## 2019-02-16 ENCOUNTER — Ambulatory Visit (HOSPITAL_COMMUNITY)
Admission: EM | Admit: 2019-02-16 | Discharge: 2019-02-16 | Disposition: A | Discharge status: Home / Self Care | Payer: Self-pay | Attending: Urgent Care | Admitting: Urgent Care

## 2019-02-16 ENCOUNTER — Ambulatory Visit (INDEPENDENT_AMBULATORY_CARE_PROVIDER_SITE_OTHER): Payer: Self-pay

## 2019-02-16 ENCOUNTER — Encounter (HOSPITAL_COMMUNITY): Payer: Self-pay

## 2019-02-16 DIAGNOSIS — M5441 Lumbago with sciatica, right side: Secondary | ICD-10-CM

## 2019-02-16 DIAGNOSIS — R200 Anesthesia of skin: Secondary | ICD-10-CM

## 2019-02-16 DIAGNOSIS — R202 Paresthesia of skin: Secondary | ICD-10-CM

## 2019-02-16 DIAGNOSIS — L2489 Irritant contact dermatitis due to other agents: Secondary | ICD-10-CM

## 2019-02-16 DIAGNOSIS — W57XXXA Bitten or stung by nonvenomous insect and other nonvenomous arthropods, initial encounter: Secondary | ICD-10-CM

## 2019-02-16 MED ORDER — PREDNISONE 20 MG PO TABS: ORAL_TABLET | ORAL | 0 refills | Status: DC

## 2019-02-16 MED ORDER — HYDROXYZINE HCL 25 MG PO TABS: 12.5000 mg | ORAL_TABLET | Freq: Three times a day (TID) | ORAL | 0 refills | Status: DC | PRN

## 2019-02-16 MED ORDER — TRIAMCINOLONE ACETONIDE 0.1 % EX CREA: 1.0000 "application " | TOPICAL_CREAM | Freq: Two times a day (BID) | CUTANEOUS | 0 refills | Status: DC

## 2019-02-16 MED FILL — hydrOXYzine HCL 25 MG TABS: 25 | 10 days supply | Qty: 30 | Fill #0

## 2019-02-16 MED FILL — FUROSEMIDE 20 MG TABS: 20 | 30 days supply | Qty: 30 | Fill #2

## 2019-02-16 MED FILL — ?AMLODIPINE BESYLATE 10 MG: 10 | 30 days supply | Qty: 30 | Fill #2

## 2019-02-16 MED FILL — predniSONE 20 MG TABS: 20 | 5 days supply | Qty: 10 | Fill #0

## 2019-02-16 MED FILL — ?ATORVASTATIN 40MG TABLET: 40 | 30 days supply | Qty: 30 | Fill #1

## 2019-02-16 MED FILL — ?TRIAMCINOLONE 0.1% CRM: 0.1 | 7 days supply | Qty: 30 | Fill #0

## 2019-02-16 MED FILL — LOSARTAN POTASSIUM 25 MG TA: 25 | 30 days supply | Qty: 30 | Fill #2

## 2019-02-16 NOTE — ED Provider Notes (Signed)
MRN: [id number] DOB: 1965-12-20  Subjective:   Rachael Stone is a 53 y.o. female presenting for 2 issues.  Reports 3-day history of persistent worsening pruritic rash over her arms legs and to a lesser extent on her torso.  Patient lives in a complex where her neighbors have had bedbugs believes now she has the same problem.  Lesions are not painful or blisterlike.  She denies fever, sore throat, cough.  She also reports several month history of persistent achy and throbbing low back pain with radiation into her right leg.  Has had uncomfortable numbness and tingling of her entire right leg as well.  She has tried to establish care with a new PCP but has been difficult due to COVID-19.  From video visits, patient was recommended to use compression stockings which have not helped.  She has been taking aspirin as well without relief.  Denies history of back trauma, falls, incontinence, dysuria, hematuria, renal stones.  Denies history of back surgery.  No current facility-administered medications for this encounter.   Current Outpatient Medications:  .  amLODipine (NORVASC) 10 MG tablet, Take 1 tablet (10 mg total) by mouth daily., Disp: 30 tablet, Rfl: 3 .  aspirin 81 MG chewable tablet, Chew 81 mg by mouth daily., Disp: , Rfl:  .  atorvastatin (LIPITOR) 40 MG tablet, Take 1 tablet (40 mg total) by mouth daily., Disp: 30 tablet, Rfl: 3 .  carvedilol (COREG) 6.25 MG tablet, Take 1 tablet (6.25 mg total) by mouth 2 (two) times daily., Disp: 60 tablet, Rfl: 3 .  furosemide (LASIX) 20 MG tablet, Take 1 tablet (20 mg total) by mouth daily., Disp: 30 tablet, Rfl: 3 .  ibuprofen (ADVIL,MOTRIN) 200 MG tablet, Take 400 mg by mouth every 6 (six) hours as needed for headache or mild pain., Disp: , Rfl:  .  losartan (COZAAR) 25 MG tablet, Take 1 tablet (25 mg total) by mouth daily., Disp: 30 tablet, Rfl: 3 .  omeprazole (PRILOSEC) 20 MG capsule, Take 1 capsule (20 mg total) by mouth daily. (Patient not  taking: Reported on 01/18/2019), Disp: 20 capsule, Rfl: 0 .  ondansetron (ZOFRAN ODT) 4 MG disintegrating tablet, Take 1 tablet (4 mg total) by mouth every 8 (eight) hours as needed for nausea or vomiting., Disp: 20 tablet, Rfl: 0 .  pantoprazole (PROTONIX) 20 MG tablet, Take 1 tablet (20 mg total) by mouth daily. (Patient not taking: Reported on 12/29/2018), Disp: 30 tablet, Rfl: 1 .  Potassium Chloride ER 20 MEQ TBCR, Take 1 tablet by mouth daily., Disp: 60 tablet, Rfl: 2 .  potassium chloride SA (K-DUR) 20 MEQ tablet, Take 1 tablet (20 mEq total) by mouth 2 (two) times daily. (Patient not taking: Reported on 12/29/2018), Disp: 30 tablet, Rfl: 0 .  triamcinolone cream (KENALOG) 0.1 %, Apply 1 application topically 2 (two) times daily., Disp: 30 g, Rfl: 0   Allergies  Allergen Reactions  . Ibuprofen Other (See Comments)    Irritates stomach    Past Medical History:  Diagnosis Date  . Back pain   . Cardiomyopathy ([place])    a. 04/08/2016 Echo: EF 35%, diff HK, mild LVH, Gr2 DD, mod AI, mod to sev MR, mildly dil LA.  [name] ETOH abuse   . Hypertensive heart disease with heart failure ([place])   . Marijuana abuse   . Moderate aortic insufficiency    a. 04/08/2016 Echo: mod AI.  [name] Moderate to Severe Mitral Regurgitation    a. 04/08/2016 Echo: mod-sev MR  directed centrally.  . Tobacco abuse      Past Surgical History:  Procedure Laterality Date  . CARDIAC CATHETERIZATION N/A 04/10/2016   Procedure: Right/Left Heart Cath and Coronary Angiography;  Surgeon: [name], MD;  Location: [facility];  Service: Cardiovascular;  Laterality: N/A;  . EXPLORATORY LAPAROTOMY  2003   Ectopic Pregnancy - unsure which side or if tube was removed  . TEE WITHOUT CARDIOVERSION N/A 06/26/2017   Procedure: TRANSESOPHAGEAL ECHOCARDIOGRAM (TEE);  Surgeon: Thayer Headings, MD;  Location: [facility];  Service: Cardiovascular;  Laterality: N/A;  . TUBAL LIGATION      ROS  Objective:   Vitals: BP (!)  144/89 (BP Location: Right Arm)   Pulse 86   Temp 97.9 F (36.6 C)   Resp 18   Wt 153 lb (69.4 kg)   BMI 25.46 kg/m   Physical Exam Constitutional:      General: She is not in acute distress.    Appearance: Normal appearance. She is well-developed. She is not ill-appearing.  HENT:     Head: Normocephalic and atraumatic.     Nose: Nose normal.     Mouth/Throat:     Mouth: Mucous membranes are moist.     Pharynx: Oropharynx is clear.  Eyes:     General: No scleral icterus.    Extraocular Movements: Extraocular movements intact.     Pupils: Pupils are equal, round, and reactive to light.  Cardiovascular:     Rate and Rhythm: Normal rate.  Pulmonary:     Effort: Pulmonary effort is normal.  Abdominal:     General: Bowel sounds are normal. There is no distension.     Tenderness: There is no abdominal tenderness. There is no right CVA tenderness, left CVA tenderness, guarding or rebound.  Musculoskeletal: Normal range of motion.        General: No swelling or deformity.     Lumbar back: She exhibits tenderness (Mild over area depicted, negative straight leg raise bilaterally). She exhibits normal range of motion, no bony tenderness, no swelling, no edema, no deformity and no spasm.       Back:     Right lower leg: No edema.     Left lower leg: No edema.  Skin:    General: Skin is warm and dry.     Findings: Rash (Multiple urticarial lesions with central bite wound in linear distribution over her wrists and lower legs bilaterally) present.  Neurological:     General: No focal deficit present.     Mental Status: She is alert and oriented to person, place, and time.     Motor: No weakness.     Coordination: Coordination normal.     Gait: Gait normal.     Deep Tendon Reflexes: Reflexes normal.  Psychiatric:        Mood and Affect: Mood normal.        Behavior: Behavior normal.        Thought Content: Thought content normal.        Judgment: Judgment normal.    Dg Lumbar  Spine Complete  Result Date: 02/16/2019 CLINICAL DATA:  Low back and radicular pain in the right leg. EXAM: LUMBAR SPINE - COMPLETE 4+ VIEW COMPARISON:  10/25/2013 FINDINGS: Normal alignment. No disc space narrowing. Mild lumbar facet osteoarthritis. No pars defect. IMPRESSION: Mild lower lumbar facet osteoarthritis. No significant disc space narrowing. No apparent change since the study of 2015. Electronically Signed   By: Nelson Chimes  M.D.   On: 02/16/2019 09:42   Assessment and Plan :   1. Irritant contact dermatitis due to other agents   2. Bedbug bite, initial encounter   3. Acute bilateral low back pain with right-sided sciatica   4. Numbness and tingling of right leg    Start Kenalog, Vistaril for contact dermatitis. Work note provided. Will use short steroid course for her back. Counseled on possibility that she will potentially need an MRI. Establish follow up with PCP. Counseled patient on potential for adverse effects with medications prescribed/recommended today, ER and return-to-clinic precautions discussed, patient verbalized understanding.   Jaynee Eagles, PA-C 02/16/19 1002

## 2019-02-16 NOTE — Discharge Instructions (Addendum)
Please apply Kenalog (a steroid cream) to the worse parts of your rash. Use a thin layer twice daily for one week, then take 1 week off. You can restart the steroid cream after that but do not use it continuously beyond 1 week. Vistaril (hydroxyzine) is really good as an antihistamine to help with itching, allergic reactions actions to bed bug bites. It can be used with Kenalog (triamcinolone). Vistaril can make you sleep so if this happens then just take the medication at bedtime.   For your back pain will use a short steroid course.  Thereafter I would like you to use Tylenol at 500 mg once every 6-8 hours as needed.

## 2019-02-16 NOTE — ED Triage Notes (Signed)
Pt states she needs a work note to return to work. Pt states she has right leg pain and throbbing x 2 months. Pt states she has been talking to her PCP about this issue as well.

## 2019-03-19 ENCOUNTER — Emergency Department (HOSPITAL_COMMUNITY)
Admission: EM | Admit: 2019-03-19 | Discharge: 2019-03-19 | Disposition: A | Discharge status: Home / Self Care | Payer: Self-pay | Attending: Emergency Medicine | Admitting: Emergency Medicine

## 2019-03-19 ENCOUNTER — Encounter (HOSPITAL_COMMUNITY): Payer: Self-pay

## 2019-03-19 ENCOUNTER — Other Ambulatory Visit: Payer: Self-pay

## 2019-03-19 ENCOUNTER — Emergency Department (HOSPITAL_COMMUNITY): Payer: Self-pay

## 2019-03-19 DIAGNOSIS — Z79899 Other long term (current) drug therapy: Secondary | ICD-10-CM | POA: Insufficient documentation

## 2019-03-19 DIAGNOSIS — Z3202 Encounter for pregnancy test, result negative: Secondary | ICD-10-CM | POA: Insufficient documentation

## 2019-03-19 DIAGNOSIS — R197 Diarrhea, unspecified: Secondary | ICD-10-CM | POA: Insufficient documentation

## 2019-03-19 DIAGNOSIS — F17210 Nicotine dependence, cigarettes, uncomplicated: Secondary | ICD-10-CM | POA: Insufficient documentation

## 2019-03-19 DIAGNOSIS — R0602 Shortness of breath: Secondary | ICD-10-CM | POA: Insufficient documentation

## 2019-03-19 DIAGNOSIS — I110 Hypertensive heart disease with heart failure: Secondary | ICD-10-CM | POA: Insufficient documentation

## 2019-03-19 DIAGNOSIS — I5040 Unspecified combined systolic (congestive) and diastolic (congestive) heart failure: Secondary | ICD-10-CM | POA: Insufficient documentation

## 2019-03-19 DIAGNOSIS — Z7982 Long term (current) use of aspirin: Secondary | ICD-10-CM | POA: Insufficient documentation

## 2019-03-19 LAB — COMPREHENSIVE METABOLIC PANEL
ALT: 21 U/L (ref 0–44)
AST: 25 U/L (ref 15–41)
Albumin: 4 g/dL (ref 3.5–5.0)
Alkaline Phosphatase: 54 U/L (ref 38–126)
Anion gap: 13 (ref 5–15)
BUN: 5 mg/dL — ABNORMAL LOW (ref 6–20)
CO2: 26 mmol/L (ref 22–32)
Calcium: 9.5 mg/dL (ref 8.9–10.3)
Chloride: 98 mmol/L (ref 98–111)
Creatinine, Ser: 0.7 mg/dL (ref 0.44–1.00)
GFR calc Af Amer: >60 mL/min (ref >60)
GFR calc non Af Amer: >60 mL/min (ref >60)
Glucose, Bld: 138 mg/dL — ABNORMAL HIGH (ref 70–99)
Potassium: 2.7 mmol/L — CL (ref 3.5–5.1)
Sodium: 137 mmol/L (ref 135–145)
Total Bilirubin: 0.3 mg/dL (ref 0.3–1.2)
Total Protein: 7.8 g/dL (ref 6.5–8.1)

## 2019-03-19 LAB — URINALYSIS, ROUTINE W REFLEX MICROSCOPIC
Bacteria, UA: NONE SEEN
Bilirubin Urine: NEGATIVE
Glucose, UA: NEGATIVE mg/dL
Hgb urine dipstick: NEGATIVE
Ketones, ur: NEGATIVE mg/dL
Nitrite: NEGATIVE
Protein, ur: NEGATIVE mg/dL
Specific Gravity, Urine: 1.011 (ref 1.005–1.030)
pH: 8 (ref 5.0–8.0)

## 2019-03-19 LAB — CBC WITH DIFFERENTIAL/PLATELET
Abs Immature Granulocytes: 0.02 10*3/uL (ref 0.00–0.07)
Basophils Absolute: 0 10*3/uL (ref 0.0–0.1)
Basophils Relative: 0 %
Eosinophils Absolute: 0.2 10*3/uL (ref 0.0–0.5)
Eosinophils Relative: 2 %
HCT: 38.3 % (ref 36.0–46.0)
Hemoglobin: 12.9 g/dL (ref 12.0–15.0)
Immature Granulocytes: 0 %
Lymphocytes Relative: 27 %
Lymphs Abs: 2.2 10*3/uL (ref 0.7–4.0)
MCH: 29.1 pg (ref 26.0–34.0)
MCHC: 33.7 g/dL (ref 30.0–36.0)
MCV: 86.3 fL (ref 80.0–100.0)
Monocytes Absolute: 0.7 10*3/uL (ref 0.1–1.0)
Monocytes Relative: 8 %
Neutro Abs: 5.1 10*3/uL (ref 1.7–7.7)
Neutrophils Relative %: 63 %
Platelets: 287 10*3/uL (ref 150–400)
RBC: 4.44 MIL/uL (ref 3.87–5.11)
RDW: 13.2 % (ref 11.5–15.5)
WBC: 8.2 10*3/uL (ref 4.0–10.5)
nRBC: 0 % (ref 0.0–0.2)

## 2019-03-19 LAB — BRAIN NATRIURETIC PEPTIDE: B Natriuretic Peptide: 14.7 pg/mL (ref 0.0–100.0)

## 2019-03-19 LAB — MAGNESIUM: Magnesium: 2 mg/dL (ref 1.7–2.4)

## 2019-03-19 LAB — CBG MONITORING, ED
Glucose-Capillary: 137 mg/dL — ABNORMAL HIGH (ref 70–99)
Glucose-Capillary: 149 mg/dL — ABNORMAL HIGH (ref 70–99)

## 2019-03-19 LAB — ETHANOL: Alcohol, Ethyl (B): 195 mg/dL — ABNORMAL HIGH (ref <10)

## 2019-03-19 LAB — RAPID URINE DRUG SCREEN, HOSP PERFORMED
Amphetamines: NOT DETECTED
Barbiturates: NOT DETECTED
Benzodiazepines: NOT DETECTED
Cocaine: NOT DETECTED
Opiates: NOT DETECTED
Tetrahydrocannabinol: POSITIVE — AB

## 2019-03-19 LAB — POC URINE PREG, ED: Preg Test, Ur: NEGATIVE

## 2019-03-19 LAB — AMMONIA: Ammonia: 16 umol/L (ref 9–35)

## 2019-03-19 MED ORDER — ALBUTEROL SULFATE HFA 108 (90 BASE) MCG/ACT IN AERS
4.0000 | INHALATION_SPRAY | Freq: Once | RESPIRATORY_TRACT | Status: AC
  Administered 2019-03-19: 19:00:00 4 via RESPIRATORY_TRACT
  Filled 2019-03-19: qty 6.7

## 2019-03-19 MED ORDER — POTASSIUM CHLORIDE ER 20 MEQ PO TBCR: 40.0000 meq | EXTENDED_RELEASE_TABLET | Freq: Two times a day (BID) | ORAL | 0 refills | Status: DC

## 2019-03-19 MED ORDER — IPRATROPIUM BROMIDE 0.02 % IN SOLN: 2.5000 mL | Freq: Once | RESPIRATORY_TRACT | Status: DC

## 2019-03-19 MED ORDER — PREDNISONE 20 MG PO TABS
40.0000 mg | ORAL_TABLET | Freq: Once | ORAL | Status: AC
  Administered 2019-03-19: 40 mg via ORAL
  Filled 2019-03-19: qty 2

## 2019-03-19 MED ORDER — ALBUTEROL SULFATE HFA 108 (90 BASE) MCG/ACT IN AERS: 1.0000 | INHALATION_SPRAY | Freq: Four times a day (QID) | RESPIRATORY_TRACT | 1 refills | Status: DC | PRN

## 2019-03-19 MED ORDER — PREDNISONE 20 MG PO TABS: 40.0000 mg | ORAL_TABLET | Freq: Every day | ORAL | 0 refills | Status: AC

## 2019-03-19 MED ORDER — POTASSIUM CHLORIDE 10 MEQ/100ML IV SOLN
10.0000 meq | Freq: Once | INTRAVENOUS | Status: AC
  Administered 2019-03-19: 19:00:00 10 meq via INTRAVENOUS
  Filled 2019-03-19: qty 100

## 2019-03-19 MED ORDER — POTASSIUM CHLORIDE CRYS ER 20 MEQ PO TBCR
40.0000 meq | EXTENDED_RELEASE_TABLET | Freq: Once | ORAL | Status: AC
  Administered 2019-03-19: 19:00:00 40 meq via ORAL
  Filled 2019-03-19: qty 2

## 2019-03-19 NOTE — ED Provider Notes (Signed)
[facility] EMERGENCY DEPARTMENT Provider Note   CSN: [id number] Arrival date & time: 03/19/19  1612     History   Chief Complaint Chief Complaint  Patient presents with  . Shortness of Breath    Tested COVID positive 2 weeks ago     HPI Rachael Stone is a 53 y.o. female with a past medical history of tobacco use, alcohol use, cardiomyopathy, and HTN who presents to the emergency department with several days of shortness of breath. Patient reports she tested positive for COVID-19 2 weeks ago. Patient reports 3 days of diarrhea and shortness of breath with a productive cough. Patient also reports irritation when she urinates. Patient reports drinking alcohol and using marijuana this morning. Patient denies chest pain, abdominal pain, or fevers. Patient reports smoking tobacco daily.    The history is provided by the patient. The history is limited by the condition of the patient.    Past Medical History:  Diagnosis Date  . Back pain   . Cardiomyopathy ([place])    a. 04/08/2016 Echo: EF 35%, diff HK, mild LVH, Gr2 DD, mod AI, mod to sev MR, mildly dil LA.  [name] ETOH abuse   . Hypertensive heart disease with heart failure ([place])   . Marijuana abuse   . Moderate aortic insufficiency    a. 04/08/2016 Echo: mod AI.  [name] Moderate to Severe Mitral Regurgitation    a. 04/08/2016 Echo: mod-sev MR directed centrally.  . Tobacco abuse     Patient Active Problem List   Diagnosis Date Noted  . Alcoholism ([place])   . Hypokalemia   . Hospital discharge follow-up   . BV (bacterial vaginosis) 01/26/2017  . Trichomonal infection 01/26/2017  . Essential hypertension 11/14/2016  . Normal coronary arteries 04/11/2016  . Hypertensive heart disease with heart failure ([place])   . NICM (nonischemic cardiomyopathy) ([place])   . Nonrheumatic aortic valve insufficiency   . Moderate to Severe Mitral Regurgitation   . Tobacco abuse   . Acute pulmonary edema ([place]) 04/08/2016  . Elevated blood  pressure 04/08/2016  . Dyspnea   . Acute combined systolic and diastolic heart failure ([place]) 04/07/2016  . VAGINAL DISCHARGE 09/10/2009  . CHEST PAIN UNSPECIFIED 08/13/2009  . DEPRESSIVE DISORDER NOT ELSEWHERE CLASSIFIED 12/14/2008  . MENORRHAGIA 12/14/2008  . NAUSEA, CHRONIC 12/14/2008  . ELEVATED BLOOD PRESSURE WITHOUT DIAGNOSIS OF HYPERTENSION 12/14/2008  . DYSFUNCTIONAL UTERINE BLEEDING 11/12/2007  . TOBACCO DEPENDENCE 09/10/2006  . GASTROESOPHAGEAL REFLUX, NO ESOPHAGITIS 09/10/2006    Past Surgical History:  Procedure Laterality Date  . CARDIAC CATHETERIZATION N/A 04/10/2016   Procedure: Right/Left Heart Cath and Coronary Angiography;  Surgeon: [name], MD;  Location: [facility];  Service: Cardiovascular;  Laterality: N/A;  . EXPLORATORY LAPAROTOMY  2003   Ectopic Pregnancy - unsure which side or if tube was removed  . TEE WITHOUT CARDIOVERSION N/A 06/26/2017   Procedure: TRANSESOPHAGEAL ECHOCARDIOGRAM (TEE);  Surgeon: Acie Fredrickson, Wonda Cheng, MD;  Location: [facility] ENDOSCOPY;  Service: Cardiovascular;  Laterality: N/A;  . TUBAL LIGATION       OB History    Gravida  3   Para  2   Term  0   Preterm  0   AB  1   Living  2     SAB  0   TAB  0   Ectopic  1   Multiple  0   Live Births  Home Medications    Prior to Admission medications   Medication Sig Start Date End Date Taking? Authorizing Provider  albuterol (VENTOLIN HFA) 108 (90 Base) MCG/ACT inhaler Inhale 1-2 puffs into the lungs every 6 (six) hours as needed for wheezing or shortness of breath. 03/19/19   Betsey Amen, MD  amLODipine (NORVASC) 10 MG tablet Take 1 tablet (10 mg total) by mouth daily. 11/26/18   Kerin Perna, NP  aspirin 81 MG chewable tablet Chew 81 mg by mouth daily.    [provider]  atorvastatin (LIPITOR) 40 MG tablet Take 1 tablet (40 mg total) by mouth daily. 11/26/18   Kerin Perna, NP  carvedilol (COREG) 6.25 MG tablet Take 1  tablet (6.25 mg total) by mouth 2 (two) times daily. 11/26/18   Kerin Perna, NP  furosemide (LASIX) 20 MG tablet Take 1 tablet (20 mg total) by mouth daily. 11/26/18   Kerin Perna, NP  hydrOXYzine (ATARAX/VISTARIL) 25 MG tablet Take 0.5-1 tablets (12.5-25 mg total) by mouth every 8 (eight) hours as needed for itching. 02/16/19   Jaynee Eagles, PA-C  ibuprofen (ADVIL,MOTRIN) 200 MG tablet Take 400 mg by mouth every 6 (six) hours as needed for headache or mild pain.    [provider]  losartan (COZAAR) 25 MG tablet Take 1 tablet (25 mg total) by mouth daily. 11/26/18   Kerin Perna, NP  omeprazole (PRILOSEC) 20 MG capsule Take 1 capsule (20 mg total) by mouth daily. Patient not taking: Reported on 01/18/2019 01/10/19   Wieters, Hallie C, PA-C  ondansetron (ZOFRAN ODT) 4 MG disintegrating tablet Take 1 tablet (4 mg total) by mouth every 8 (eight) hours as needed for nausea or vomiting. 01/10/19   Wieters, Hallie C, PA-C  pantoprazole (PROTONIX) 20 MG tablet Take 1 tablet (20 mg total) by mouth daily. Patient not taking: Reported on 12/29/2018 11/26/18   Kerin Perna, NP  potassium chloride 20 MEQ TBCR Take 40 mEq by mouth 2 (two) times daily for 5 days. 03/19/19 03/24/19  Betsey Amen, MD  potassium chloride SA (K-DUR) 20 MEQ tablet Take 1 tablet (20 mEq total) by mouth 2 (two) times daily. Patient not taking: Reported on 12/29/2018 12/24/18   Charlesetta Shanks, MD  predniSONE (DELTASONE) 20 MG tablet Take 2 tablets (40 mg total) by mouth daily for 4 days. 03/19/19 03/23/19  Betsey Amen, MD  triamcinolone cream (KENALOG) 0.1 % Apply 1 application topically 2 (two) times daily. 02/16/19   Jaynee Eagles, PA-C    Family History Family History  Problem Relation Age of Onset  . CAD Mother        First MI @ 38 - 90 total.  . Cirrhosis Father        alcoholic - died in his 58'K.  . Lupus Sister     Social History Social History   Tobacco Use  . Smoking status: Current  Every Day Smoker    Packs/day: 0.50    Years: 15.00    Pack years: 7.50    Types: Cigarettes  . Smokeless tobacco: Never Used  Substance Use Topics  . Alcohol use: Yes    Comment: At least 12 ounces of homemade moon shine every other day.  . Drug use: Yes    Types: Marijuana    Comment: Smokes marijuana daily.  Used to smoke cocaine laced  marijuana cigarettes for 15-20 yrs but quit cocaine 6 yrs ago.     Allergies   Ibuprofen  Review of Systems Review of Systems  Constitutional: Negative for fever.  HENT: Negative for trouble swallowing.   Respiratory: Positive for cough and shortness of breath.   Cardiovascular: Negative for chest pain.  Gastrointestinal: Positive for diarrhea. Negative for abdominal pain and constipation.  Genitourinary: Positive for dysuria.  Musculoskeletal: Negative for back pain.  Neurological: Negative for headaches.     Physical Exam Updated Vital Signs BP (!) 148/83   Pulse (!) 103   Temp 98.3 F (36.8 C) (Oral)   Resp 15   Ht 5\' 5"  (1.651 m)   Wt 69.4 kg   SpO2 100%   BMI 25.46 kg/m   Physical Exam Constitutional:      General: She is not in acute distress.    Comments: Appears intoxicated  HENT:     Head: Normocephalic and atraumatic.     Right Ear: External ear normal.     Left Ear: External ear normal.     Nose: Congestion present.     Mouth/Throat:     Mouth: Mucous membranes are moist.     Pharynx: Oropharynx is clear.  Eyes:     Pupils: Pupils are equal, round, and reactive to light.  Neck:     Musculoskeletal: Neck supple.  Cardiovascular:     Rate and Rhythm: Normal rate and regular rhythm.     Pulses: Normal pulses.  Pulmonary:     Effort: Pulmonary effort is normal. No respiratory distress.     Breath sounds: No wheezing or rhonchi.     Comments: Occasional upper airway congestion breath sounds Chest:     Chest wall: No tenderness.  Abdominal:     Palpations: Abdomen is soft.     Tenderness: There is no  abdominal tenderness. There is no guarding or rebound.  Musculoskeletal:     Right lower leg: No edema.     Left lower leg: No edema.  Skin:    General: Skin is warm and dry.  Neurological:     General: No focal deficit present.     Mental Status: She is alert.     Cranial Nerves: No cranial nerve deficit or dysarthria.     Sensory: No sensory deficit.     Motor: No weakness.     Coordination: Coordination is intact.     Gait: Gait is intact.      ED Treatments / Results  Labs (all labs ordered are listed, but only abnormal results are displayed) Labs Reviewed  COMPREHENSIVE METABOLIC PANEL - Abnormal; Notable for the following components:      Result Value   Potassium 2.7 (*)    Glucose, Bld 138 (*)    BUN 5 (*)    All other components within normal limits  ETHANOL - Abnormal; Notable for the following components:   Alcohol, Ethyl (B) 195 (*)    All other components within normal limits  URINALYSIS, ROUTINE W REFLEX MICROSCOPIC - Abnormal; Notable for the following components:   APPearance CLOUDY (*)    Leukocytes,Ua TRACE (*)    All other components within normal limits  RAPID URINE DRUG SCREEN, HOSP PERFORMED - Abnormal; Notable for the following components:   Tetrahydrocannabinol POSITIVE (*)    All other components within normal limits  CBG MONITORING, ED - Abnormal; Notable for the following components:   Glucose-Capillary 137 (*)    All other components within normal limits  CBG MONITORING, ED - Abnormal; Notable for the following components:   Glucose-Capillary 149 (*)  All other components within normal limits  CBC WITH DIFFERENTIAL/PLATELET  AMMONIA  BRAIN NATRIURETIC PEPTIDE  MAGNESIUM  POC URINE PREG, ED    EKG EKG Interpretation  Date/Time:  Saturday March 19 2019 17:09:10 EDT Ventricular Rate:  85 PR Interval:    QRS Duration: 99 QT Interval:  400 QTC Calculation: 476 R Axis:   47 Text Interpretation:  Sinus rhythm Borderline T  abnormalities, anterior leads similar to June 2020 Confirmed by Sherwood Gambler [phone]) on 03/19/2019 6:03:22 PM   Radiology Dg Chest Portable 1 View  Result Date: 03/19/2019 CLINICAL DATA:  Pt to ED for SOB after drug use. Pt does not follow instructions. Pt is crying, cannot understand what pt saying. Pt hx ETOH abuse. Pt is a smoker. EXAM: PORTABLE CHEST 1 VIEW COMPARISON:  Chest radiograph 12/24/2018 FINDINGS: Stable cardiomediastinal contours with mildly enlarged heart size. Low lung volumes with bronchovascular crowding. No new focal infiltrate. No pneumothorax or large pleural effusion. No acute finding in the visualized skeleton. IMPRESSION: Low volume study with some bronchovascular crowding. No focal infiltrate identified. Electronically Signed   By: Audie Pinto M.D.   On: 03/19/2019 17:25    Procedures Procedures (including critical care time)  Medications Ordered in ED Medications  ipratropium (ATROVENT) nebulizer solution 0.5 mg (0.5 mg Inhalation Not Given 03/19/19 2049)  potassium chloride SA (K-DUR) CR tablet 40 mEq (40 mEq Oral Given 03/19/19 1835)  potassium chloride 10 mEq in 100 mL IVPB (0 mEq Intravenous Stopped 03/19/19 1945)  albuterol (VENTOLIN HFA) 108 (90 Base) MCG/ACT inhaler 4 puff (4 puffs Inhalation Given 03/19/19 1907)  predniSONE (DELTASONE) tablet 40 mg (40 mg Oral Given 03/19/19 1854)     Initial Impression / Assessment and Plan / ED Course  I have reviewed the triage vital signs and the nursing notes.  Pertinent labs & imaging results that were available during my care of the patient were reviewed by me and considered in my medical decision making (see chart for details).        On arrival patient appears intoxicated. Productive cough of whitish mucus during examination. No increased work of breathing. Concern for possible pneumonia vs CHF exacerbation, vs COPD exacerbation. Patient reports testing positive for COVID-19 but unclear records.   Patient was  found to be hypokalemic, normal magnesium level. Potassium was repleted while in the emergency department. UA not consistent with UTI. Given smoking history, new productive cough, will do trial of steroids and inhaler in emergency department. Patient observed in the emergency department while she metabolized. On reassessment patient appears clinically sober, reports breathing has improved and continues to have a benign abdominal exam.  Advised symptomatic care with PO fluids. Prescribed short course of potassium for further repletion in the setting of on-going diarrhea. Patient with improvement in breathing symptoms after steroids and albuterol, will prescribe. Likely component of COPD exacerbation/smoking contributing to symptoms. All questions answered and strict return precautions given. Patient clinically sober and able to ambulate with a steady gait. Patient stable for discharge.   Patient seen and plan discussed with Dr. Regenia Skeeter.  Final Clinical Impressions(s) / ED Diagnoses   Final diagnoses:  Diarrhea, unspecified type  SOB (shortness of breath)    ED Discharge Orders         Ordered    potassium chloride 20 MEQ TBCR  2 times daily     03/19/19 2329    predniSONE (DELTASONE) 20 MG tablet  Daily     03/19/19 2329    albuterol (VENTOLIN  HFA) 108 (90 Base) MCG/ACT inhaler  Every 6 hours PRN     03/19/19 2329           Betsey Amen, MD 03/20/19 [facility]    Sherwood Gambler, MD 03/20/19 1515

## 2019-03-19 NOTE — ED Notes (Signed)
Patient collected a urine sample, but "lost it." The patient was provided with another sample cup. A blood sugar was collected and was within normal limits at 137.

## 2019-03-19 NOTE — ED Notes (Signed)
Patient verbalizes understanding of discharge instructions. Opportunity for questioning and answers were provided. Armband removed by staff, pt discharged from ED ambulatory.   

## 2019-03-19 NOTE — ED Triage Notes (Signed)
Patient states she had "two drinks" and "smoking reefa" prior to this episode

## 2019-03-19 NOTE — ED Triage Notes (Signed)
Patient called EMS because she was short of breath. EMS states she initially did not want to go to the ER but that she changed her mind while being assessed. The patient admitted to illicit drug use to EMS, including marijuana as well as cigarettes and alcohol. The patient has a steady gait and complains of needing to use the restroom at this time. A cup was provided for a urine sample.

## 2019-03-29 MED FILL — FUROSEMIDE 20 MG TABS: 20 | 30 days supply | Qty: 30 | Fill #3

## 2019-03-29 MED FILL — LOSARTAN POTASSIUM 25 MG TA: 25 | 30 days supply | Qty: 30 | Fill #3

## 2019-03-29 MED FILL — POTASSIUM CL ER 20 MEQ TAB: 20 | 30 days supply | Qty: 30 | Fill #3

## 2019-03-29 MED FILL — ?AMLODIPINE BESYLATE 10 MG: 10 | 30 days supply | Qty: 30 | Fill #3

## 2019-04-04 ENCOUNTER — Ambulatory Visit (HOSPITAL_COMMUNITY)
Admission: EM | Admit: 2019-04-04 | Discharge: 2019-04-04 | Discharge status: Against Medical Advice | Payer: Medicaid Other

## 2019-04-04 ENCOUNTER — Other Ambulatory Visit: Payer: Self-pay

## 2019-04-04 NOTE — ED Notes (Signed)
Pt left, told registration she did not want to wait once she saw the number of people in the lobby.

## 2019-04-05 ENCOUNTER — Ambulatory Visit (HOSPITAL_COMMUNITY)
Admission: EM | Admit: 2019-04-05 | Discharge: 2019-04-05 | Disposition: A | Discharge status: Home / Self Care | Payer: Medicaid Other | Attending: Emergency Medicine | Admitting: Emergency Medicine

## 2019-04-05 ENCOUNTER — Encounter (HOSPITAL_COMMUNITY): Payer: Self-pay

## 2019-04-05 ENCOUNTER — Other Ambulatory Visit: Payer: Self-pay

## 2019-04-05 DIAGNOSIS — L02213 Cutaneous abscess of chest wall: Secondary | ICD-10-CM

## 2019-04-05 DIAGNOSIS — L0291 Cutaneous abscess, unspecified: Secondary | ICD-10-CM

## 2019-04-05 MED ORDER — SULFAMETHOXAZOLE-TRIMETHOPRIM 800-160 MG PO TABS: 1.0000 | ORAL_TABLET | Freq: Two times a day (BID) | ORAL | 0 refills | Status: AC

## 2019-04-05 MED FILL — ?SULFAMETHOXAZOLE-TMP DS TB: 800-160 | 10 days supply | Qty: 20 | Fill #0

## 2019-04-05 NOTE — ED Triage Notes (Signed)
Pt presents with an abscess between breast since Wednesday.

## 2019-04-05 NOTE — Discharge Instructions (Signed)
Keep your wound clean and dry.  Wash it gently twice a day with soap and water.  Apply an antibiotic cream twice a day.    Take the antibiotic as prescribed.  Return here if you see signs of infection, such as increased pain, redness, warmth, fever, chills, or other concerning symptoms.

## 2019-04-05 NOTE — ED Provider Notes (Signed)
[place]    CSN: [id number] Arrival date & time: 04/05/19  1551      History   Chief Complaint Chief Complaint  Patient presents with  . Abscess    HPI Rachael Stone is a 53 y.o. female.   Patient presents with an abscess on her left chest wall x 5 days.  No drainage.  She denies fever, chills, breast mass, skin changes, nipple discharge, or other symptoms.  The history is provided by the patient.    Past Medical History:  Diagnosis Date  . Back pain   . Cardiomyopathy ([place])    a. 04/08/2016 Echo: EF 35%, diff HK, mild LVH, Gr2 DD, mod AI, mod to sev MR, mildly dil LA.  [name] ETOH abuse   . Hypertensive heart disease with heart failure ([place])   . Marijuana abuse   . Moderate aortic insufficiency    a. 04/08/2016 Echo: mod AI.  [name] Moderate to Severe Mitral Regurgitation    a. 04/08/2016 Echo: mod-sev MR directed centrally.  . Tobacco abuse     Patient Active Problem List   Diagnosis Date Noted  . Alcoholism ([place])   . Hypokalemia   . Hospital discharge follow-up   . BV (bacterial vaginosis) 01/26/2017  . Trichomonal infection 01/26/2017  . Essential hypertension 11/14/2016  . Normal coronary arteries 04/11/2016  . Hypertensive heart disease with heart failure ([place])   . NICM (nonischemic cardiomyopathy) ([place])   . Nonrheumatic aortic valve insufficiency   . Moderate to Severe Mitral Regurgitation   . Tobacco abuse   . Acute pulmonary edema ([place]) 04/08/2016  . Elevated blood pressure 04/08/2016  . Dyspnea   . Acute combined systolic and diastolic heart failure ([place]) 04/07/2016  . VAGINAL DISCHARGE 09/10/2009  . CHEST PAIN UNSPECIFIED 08/13/2009  . DEPRESSIVE DISORDER NOT ELSEWHERE CLASSIFIED 12/14/2008  . MENORRHAGIA 12/14/2008  . NAUSEA, CHRONIC 12/14/2008  . ELEVATED BLOOD PRESSURE WITHOUT DIAGNOSIS OF HYPERTENSION 12/14/2008  . DYSFUNCTIONAL UTERINE BLEEDING 11/12/2007  . TOBACCO DEPENDENCE 09/10/2006  . GASTROESOPHAGEAL REFLUX, NO ESOPHAGITIS  09/10/2006    Past Surgical History:  Procedure Laterality Date  . CARDIAC CATHETERIZATION N/A 04/10/2016   Procedure: Right/Left Heart Cath and Coronary Angiography;  Surgeon: [name], MD;  Location: [facility];  Service: Cardiovascular;  Laterality: N/A;  . EXPLORATORY LAPAROTOMY  2003   Ectopic Pregnancy - unsure which side or if tube was removed  . TEE WITHOUT CARDIOVERSION N/A 06/26/2017   Procedure: TRANSESOPHAGEAL ECHOCARDIOGRAM (TEE);  Surgeon: Acie Fredrickson, Wonda Cheng, MD;  Location: [facility];  Service: Cardiovascular;  Laterality: N/A;  . TUBAL LIGATION      OB History    Gravida  3   Para  2   Term  0   Preterm  0   AB  1   Living  2     SAB  0   TAB  0   Ectopic  1   Multiple  0   Live Births               Home Medications    Prior to Admission medications   Medication Sig Start Date End Date Taking? Authorizing Provider  albuterol (VENTOLIN HFA) 108 (90 Base) MCG/ACT inhaler Inhale 1-2 puffs into the lungs every 6 (six) hours as needed for wheezing or shortness of breath. 03/19/19   Betsey Amen, MD  amLODipine (NORVASC) 10 MG tablet Take 1 tablet (10 mg total) by mouth daily. 11/26/18   Juluis Mire  P, NP  aspirin 81 MG chewable tablet Chew 81 mg by mouth daily.    [provider]  atorvastatin (LIPITOR) 40 MG tablet Take 1 tablet (40 mg total) by mouth daily. 11/26/18   Kerin Perna, NP  carvedilol (COREG) 6.25 MG tablet Take 1 tablet (6.25 mg total) by mouth 2 (two) times daily. 11/26/18   Kerin Perna, NP  furosemide (LASIX) 20 MG tablet Take 1 tablet (20 mg total) by mouth daily. 11/26/18   Kerin Perna, NP  hydrOXYzine (ATARAX/VISTARIL) 25 MG tablet Take 0.5-1 tablets (12.5-25 mg total) by mouth every 8 (eight) hours as needed for itching. 02/16/19   Jaynee Eagles, PA-C  ibuprofen (ADVIL,MOTRIN) 200 MG tablet Take 400 mg by mouth every 6 (six) hours as needed for headache or mild pain.     [provider]  losartan (COZAAR) 25 MG tablet Take 1 tablet (25 mg total) by mouth daily. 11/26/18   Kerin Perna, NP  omeprazole (PRILOSEC) 20 MG capsule Take 1 capsule (20 mg total) by mouth daily. Patient not taking: Reported on 01/18/2019 01/10/19   Wieters, Hallie C, PA-C  ondansetron (ZOFRAN ODT) 4 MG disintegrating tablet Take 1 tablet (4 mg total) by mouth every 8 (eight) hours as needed for nausea or vomiting. 01/10/19   Wieters, Hallie C, PA-C  pantoprazole (PROTONIX) 20 MG tablet Take 1 tablet (20 mg total) by mouth daily. Patient not taking: Reported on 12/29/2018 11/26/18   Kerin Perna, NP  potassium chloride 20 MEQ TBCR Take 40 mEq by mouth 2 (two) times daily for 5 days. 03/19/19 03/24/19  Betsey Amen, MD  potassium chloride SA (K-DUR) 20 MEQ tablet Take 1 tablet (20 mEq total) by mouth 2 (two) times daily. Patient not taking: Reported on 12/29/2018 12/24/18   Charlesetta Shanks, MD  sulfamethoxazole-trimethoprim (BACTRIM DS) 800-160 MG tablet Take 1 tablet by mouth 2 (two) times daily for 10 days. 04/05/19 04/15/19  Sharion Balloon, NP  triamcinolone cream (KENALOG) 0.1 % Apply 1 application topically 2 (two) times daily. 02/16/19   Jaynee Eagles, PA-C    Family History Family History  Problem Relation Age of Onset  . CAD Mother        First MI @ 12 - 83 total.  . Cirrhosis Father        alcoholic - died in his 69'G.  . Lupus Sister     Social History Social History   Tobacco Use  . Smoking status: Current Every Day Smoker    Packs/day: 0.50    Years: 15.00    Pack years: 7.50    Types: Cigarettes  . Smokeless tobacco: Never Used  Substance Use Topics  . Alcohol use: Yes    Comment: At least 12 ounces of homemade moon shine every other day.  . Drug use: Yes    Types: Marijuana    Comment: Smokes marijuana daily.  Used to smoke cocaine laced  marijuana cigarettes for 15-20 yrs but quit cocaine 6 yrs ago.     Allergies   Ibuprofen   Review of  Systems Review of Systems  Constitutional: Negative for chills and fever.  HENT: Negative for ear pain and sore throat.   Eyes: Negative for pain and visual disturbance.  Respiratory: Negative for cough and shortness of breath.   Cardiovascular: Negative for chest pain and palpitations.  Gastrointestinal: Negative for abdominal pain and vomiting.  Genitourinary: Negative for dysuria and hematuria.  Musculoskeletal: Negative for arthralgias and back  pain.  Skin: Positive for wound. Negative for color change and rash.  Neurological: Negative for seizures and syncope.  All other systems reviewed and are negative.    Physical Exam Triage Vital Signs ED Triage Vitals  Enc Vitals Group     BP 04/05/19 1632 140/72     Pulse Rate 04/05/19 1632 87     Resp 04/05/19 1632 17     Temp 04/05/19 1632 97.9 F (36.6 C)     Temp Source 04/05/19 1632 Oral     SpO2 04/05/19 1632 100 %     Weight --      Height --      Head Circumference --      Peak Flow --      Pain Score 04/05/19 1634 9     Pain Loc --      Pain Edu? --      Excl. in [place]? --    No data found.  Updated Vital Signs BP 140/72 (BP Location: Left Arm)   Pulse 87   Temp 97.9 F (36.6 C) (Oral)   Resp 17   SpO2 100%   Visual Acuity Right Eye Distance:   Left Eye Distance:   Bilateral Distance:    Right Eye Near:   Left Eye Near:    Bilateral Near:     Physical Exam Vitals signs and nursing note reviewed.  Constitutional:      General: She is not in acute distress.    Appearance: She is well-developed.  HENT:     Head: Normocephalic and atraumatic.  Eyes:     Conjunctiva/sclera: Conjunctivae normal.  Neck:     Musculoskeletal: Neck supple.  Cardiovascular:     Rate and Rhythm: Normal rate and regular rhythm.     Heart sounds: No murmur.  Pulmonary:     Effort: Pulmonary effort is normal. No respiratory distress.     Breath sounds: Normal breath sounds.  Chest:    Abdominal:     Palpations: Abdomen is  soft.     Tenderness: There is no abdominal tenderness. There is no guarding or rebound.  Skin:    General: Skin is warm and dry.     Findings: Lesion present.     Comments: 3 cm area of firm induration on left chest. Nonfluctuant. No drainage.   No breast mass or nipple discharge.    Neurological:     Mental Status: She is alert.      UC Treatments / Results  Labs (all labs ordered are listed, but only abnormal results are displayed) Labs Reviewed - No data to display  EKG   Radiology No results found.  Procedures Procedures (including critical care time)  Medications Ordered in UC Medications - No data to display  Initial Impression / Assessment and Plan / UC Course  I have reviewed the triage vital signs and the nursing notes.  Pertinent labs & imaging results that were available during my care of the patient were reviewed by me and considered in my medical decision making (see chart for details).    Abscess on left chest wall.  Treating with Septra DS.  Wound care instructions and signs of infection discussed with patient.  Discussed with patient that she should return here if she has increased pain, redness, warmth, fever, chills, or other concerns.  Discussed with patient that, while this does not appear to be breast related, she should follow-up with her PCP to discuss whether she needs a mammogram once  the acute infection has cleared.  Patient agrees to plan of care.     Final Clinical Impressions(s) / UC Diagnoses   Final diagnoses:  Abscess     Discharge Instructions     Keep your wound clean and dry.  Wash it gently twice a day with soap and water.  Apply an antibiotic cream twice a day.    Take the antibiotic as prescribed.  Return here if you see signs of infection, such as increased pain, redness, warmth, fever, chills, or other concerning symptoms.         ED Prescriptions    Medication Sig Dispense Auth. Provider    sulfamethoxazole-trimethoprim (BACTRIM DS) 800-160 MG tablet Take 1 tablet by mouth 2 (two) times daily for 10 days. 20 tablet Sharion Balloon, NP     PDMP not reviewed this encounter.   Sharion Balloon, NP 04/05/19 [phone]

## 2019-04-09 ENCOUNTER — Ambulatory Visit (HOSPITAL_COMMUNITY)
Admission: EM | Admit: 2019-04-09 | Discharge: 2019-04-09 | Disposition: A | Discharge status: Home / Self Care | Payer: Medicaid Other

## 2019-04-09 ENCOUNTER — Other Ambulatory Visit: Payer: Self-pay

## 2019-04-09 ENCOUNTER — Encounter (HOSPITAL_COMMUNITY): Payer: Self-pay | Admitting: Emergency Medicine

## 2019-04-09 DIAGNOSIS — L02213 Cutaneous abscess of chest wall: Secondary | ICD-10-CM

## 2019-04-09 DIAGNOSIS — L0291 Cutaneous abscess, unspecified: Secondary | ICD-10-CM

## 2019-04-09 NOTE — Discharge Instructions (Addendum)
Continue to take the Septra DS as prescribed.    Encouraged the wound to drain.  Keep it clean and dry.  Wash it twice a day with soap and water gently.    If the area is not improving in one week, follow-up with the surgeon listed below.   Return here if you had fever, chills, increased redness, increased pain, increased size of the abscess, or other concerns.

## 2019-04-09 NOTE — ED Triage Notes (Signed)
04/05/2019 for abscess.  Patient thinks area is infected, reports pus and red.    Patient questions if she is allergic to medicine because hands intermittently go numb.

## 2019-04-09 NOTE — ED Provider Notes (Signed)
[place]    CSN: [id number] Arrival date & time: 04/09/19  1106      History   Chief Complaint Chief Complaint  Patient presents with  . Cyst    HPI Rachael Stone is a 53 y.o. female.   Patient presents for a recheck of an abscess on her left chest.  She was seen here on 04/05/2019 and started on Septra DS.  She reports ongoing moderate purulent drainage.  She wonders if she is allergic to the antibiotic as she has intermittent numbness in her hands at night when she is asleep.  She denies fever, chills, increased redness in the area, increased pain, or other symptoms.  The history is provided by the patient.    Past Medical History:  Diagnosis Date  . Back pain   . Cardiomyopathy ([place])    a. 04/08/2016 Echo: EF 35%, diff HK, mild LVH, Gr2 DD, mod AI, mod to sev MR, mildly dil LA.  [name] ETOH abuse   . Hypertensive heart disease with heart failure ([place])   . Marijuana abuse   . Moderate aortic insufficiency    a. 04/08/2016 Echo: mod AI.  [name] Moderate to Severe Mitral Regurgitation    a. 04/08/2016 Echo: mod-sev MR directed centrally.  . Tobacco abuse     Patient Active Problem List   Diagnosis Date Noted  . Alcoholism ([place])   . Hypokalemia   . Hospital discharge follow-up   . BV (bacterial vaginosis) 01/26/2017  . Trichomonal infection 01/26/2017  . Essential hypertension 11/14/2016  . Normal coronary arteries 04/11/2016  . Hypertensive heart disease with heart failure ([place])   . NICM (nonischemic cardiomyopathy) ([place])   . Nonrheumatic aortic valve insufficiency   . Moderate to Severe Mitral Regurgitation   . Tobacco abuse   . Acute pulmonary edema ([place]) 04/08/2016  . Elevated blood pressure 04/08/2016  . Dyspnea   . Acute combined systolic and diastolic heart failure ([place]) 04/07/2016  . VAGINAL DISCHARGE 09/10/2009  . CHEST PAIN UNSPECIFIED 08/13/2009  . DEPRESSIVE DISORDER NOT ELSEWHERE CLASSIFIED 12/14/2008  . MENORRHAGIA 12/14/2008  . NAUSEA,  CHRONIC 12/14/2008  . ELEVATED BLOOD PRESSURE WITHOUT DIAGNOSIS OF HYPERTENSION 12/14/2008  . DYSFUNCTIONAL UTERINE BLEEDING 11/12/2007  . TOBACCO DEPENDENCE 09/10/2006  . GASTROESOPHAGEAL REFLUX, NO ESOPHAGITIS 09/10/2006    Past Surgical History:  Procedure Laterality Date  . CARDIAC CATHETERIZATION N/A 04/10/2016   Procedure: Right/Left Heart Cath and Coronary Angiography;  Surgeon: [name], MD;  Location: [place] [facility];  Service: Cardiovascular;  Laterality: N/A;  . EXPLORATORY LAPAROTOMY  2003   Ectopic Pregnancy - unsure which side or if tube was removed  . TEE WITHOUT CARDIOVERSION N/A 06/26/2017   Procedure: TRANSESOPHAGEAL ECHOCARDIOGRAM (TEE);  Surgeon: Acie Fredrickson, Wonda Cheng, MD;  Location: [facility]COPY;  Service: Cardiovascular;  Laterality: N/A;  . TUBAL LIGATION      OB History    Gravida  3   Para  2   Term  0   Preterm  0   AB  1   Living  2     SAB  0   TAB  0   Ectopic  1   Multiple  0   Live Births               Home Medications    Prior to Admission medications   Medication Sig Start Date End Date Taking? Authorizing Provider  amLODipine (NORVASC) 10 MG tablet Take 1 tablet (10 mg total) by mouth  daily. 11/26/18  Yes Kerin Perna, NP  atorvastatin (LIPITOR) 40 MG tablet Take 1 tablet (40 mg total) by mouth daily. 11/26/18  Yes Kerin Perna, NP  carvedilol (COREG) 6.25 MG tablet Take 1 tablet (6.25 mg total) by mouth 2 (two) times daily. 11/26/18  Yes Kerin Perna, NP  furosemide (LASIX) 20 MG tablet Take 1 tablet (20 mg total) by mouth daily. 11/26/18  Yes Kerin Perna, NP  losartan (COZAAR) 25 MG tablet Take 1 tablet (25 mg total) by mouth daily. 11/26/18  Yes Kerin Perna, NP  potassium chloride SA (K-DUR) 20 MEQ tablet Take 1 tablet (20 mEq total) by mouth 2 (two) times daily. 12/24/18  Yes Charlesetta Shanks, MD  sulfamethoxazole-trimethoprim (BACTRIM DS) 800-160 MG tablet Take 1 tablet by mouth 2  (two) times daily for 10 days. 04/05/19 04/15/19 Yes Sharion Balloon, NP  albuterol (VENTOLIN HFA) 108 (90 Base) MCG/ACT inhaler Inhale 1-2 puffs into the lungs every 6 (six) hours as needed for wheezing or shortness of breath. 03/19/19   Betsey Amen, MD  aspirin 81 MG chewable tablet Chew 81 mg by mouth daily.    [provider]  hydrOXYzine (ATARAX/VISTARIL) 25 MG tablet Take 0.5-1 tablets (12.5-25 mg total) by mouth every 8 (eight) hours as needed for itching. 02/16/19   Jaynee Eagles, PA-C  ibuprofen (ADVIL,MOTRIN) 200 MG tablet Take 400 mg by mouth every 6 (six) hours as needed for headache or mild pain.    [provider]  omeprazole (PRILOSEC) 20 MG capsule Take 1 capsule (20 mg total) by mouth daily. Patient not taking: Reported on 01/18/2019 01/10/19   Wieters, Hallie C, PA-C  ondansetron (ZOFRAN ODT) 4 MG disintegrating tablet Take 1 tablet (4 mg total) by mouth every 8 (eight) hours as needed for nausea or vomiting. 01/10/19   Wieters, Hallie C, PA-C  pantoprazole (PROTONIX) 20 MG tablet Take 1 tablet (20 mg total) by mouth daily. Patient not taking: Reported on 12/29/2018 11/26/18   Kerin Perna, NP  potassium chloride 20 MEQ TBCR Take 40 mEq by mouth 2 (two) times daily for 5 days. 03/19/19 03/24/19  Betsey Amen, MD  triamcinolone cream (KENALOG) 0.1 % Apply 1 application topically 2 (two) times daily. 02/16/19   Jaynee Eagles, PA-C    Family History Family History  Problem Relation Age of Onset  . CAD Mother        First MI @ 61 - 15 total.  . Cirrhosis Father        alcoholic - died in his 79'G.  . Lupus Sister     Social History Social History   Tobacco Use  . Smoking status: Current Every Day Smoker    Packs/day: 0.50    Years: 15.00    Pack years: 7.50    Types: Cigarettes  . Smokeless tobacco: Never Used  Substance Use Topics  . Alcohol use: Yes    Comment: At least 12 ounces of homemade moon shine every other day.  . Drug use: Yes    Types:  Marijuana    Comment: Smokes marijuana daily.  Used to smoke cocaine laced  marijuana cigarettes for 15-20 yrs but quit cocaine 6 yrs ago.     Allergies   Ibuprofen   Review of Systems Review of Systems  Constitutional: Negative for chills and fever.  HENT: Negative for ear pain and sore throat.   Eyes: Negative for pain and visual disturbance.  Respiratory: Negative for cough and  shortness of breath.   Cardiovascular: Negative for chest pain and palpitations.  Gastrointestinal: Negative for abdominal pain and vomiting.  Genitourinary: Negative for dysuria and hematuria.  Musculoskeletal: Negative for arthralgias and back pain.  Skin: Positive for wound. Negative for color change and rash.  Neurological: Negative for seizures and syncope.  All other systems reviewed and are negative.    Physical Exam Triage Vital Signs ED Triage Vitals  Enc Vitals Group     BP 04/09/19 1145 124/71     Pulse Rate 04/09/19 1145 (!) 104     Resp 04/09/19 1145 18     Temp 04/09/19 1145 99.1 F (37.3 C)     Temp Source 04/09/19 1145 Oral     SpO2 04/09/19 1145 100 %     Weight --      Height --      Head Circumference --      Peak Flow --      Pain Score 04/09/19 1139 8     Pain Loc --      Pain Edu? --      Excl. in [place]? --    No data found.  Updated Vital Signs BP 124/71 (BP Location: Right Arm)   Pulse (!) 104   Temp 99.1 F (37.3 C) (Oral)   Resp 18   SpO2 100%   Visual Acuity Right Eye Distance:   Left Eye Distance:   Bilateral Distance:    Right Eye Near:   Left Eye Near:    Bilateral Near:     Physical Exam Vitals signs and nursing note reviewed.  Constitutional:      General: She is not in acute distress.    Appearance: She is well-developed.  HENT:     Head: Normocephalic and atraumatic.  Eyes:     Conjunctiva/sclera: Conjunctivae normal.  Neck:     Musculoskeletal: Neck supple.  Cardiovascular:     Rate and Rhythm: Normal rate and regular rhythm.      Heart sounds: No murmur.  Pulmonary:     Effort: Pulmonary effort is normal. No respiratory distress.     Breath sounds: Normal breath sounds.  Abdominal:     Palpations: Abdomen is soft.     Tenderness: There is no abdominal tenderness.  Skin:    General: Skin is warm and dry.     Findings: Lesion present.     Comments: 2 cm area of induration with central open lesion with small amount of purulent drainage.  Decreased erythema and size since previous exam.  Neurological:     Mental Status: She is alert.      UC Treatments / Results  Labs (all labs ordered are listed, but only abnormal results are displayed) Labs Reviewed - No data to display  EKG   Radiology No results found.  Procedures Procedures (including critical care time)  Medications Ordered in UC Medications - No data to display  Initial Impression / Assessment and Plan / UC Course  I have reviewed the triage vital signs and the nursing notes.  Pertinent labs & imaging results that were available during my care of the patient were reviewed by me and considered in my medical decision making (see chart for details).    Abscess on left chest wall, improving.  Instructed patient to continue taking the Septra DS.  Wound care instructions and signs of infection discussed again.  Instructed her to follow-up with a general surgeon if the area is not improving significantly in 1  week.  Instructed her to return here if she has fever, chills, signs of infection, or other concerns.  Patient agrees to plan of care.     Final Clinical Impressions(s) / UC Diagnoses   Final diagnoses:  Abscess     Discharge Instructions     Continue to take the Septra DS as prescribed.    Encouraged the wound to drain.  Keep it clean and dry.  Wash it twice a day with soap and water gently.    If the area is not improving in one week, follow-up with the surgeon listed below.   Return here if you had fever, chills, increased  redness, increased pain, increased size of the abscess, or other concerns.          ED Prescriptions    None     PDMP not reviewed this encounter.   Sharion Balloon, NP 04/09/19 [phone]

## 2019-04-13 ENCOUNTER — Ambulatory Visit (INDEPENDENT_AMBULATORY_CARE_PROVIDER_SITE_OTHER): Payer: Medicaid Other | Admitting: Primary Care

## 2019-04-25 ENCOUNTER — Ambulatory Visit (INDEPENDENT_AMBULATORY_CARE_PROVIDER_SITE_OTHER): Payer: Self-pay | Admitting: Primary Care

## 2019-04-25 ENCOUNTER — Encounter (INDEPENDENT_AMBULATORY_CARE_PROVIDER_SITE_OTHER): Payer: Self-pay | Admitting: Primary Care

## 2019-04-25 ENCOUNTER — Other Ambulatory Visit: Payer: Self-pay

## 2019-04-25 VITALS — BP 131/73 | HR 89 | Temp 97.3°F | Ht 65.0 in | Wt 143.4 lb

## 2019-04-25 DIAGNOSIS — Z1231 Encounter for screening mammogram for malignant neoplasm of breast: Secondary | ICD-10-CM

## 2019-04-25 DIAGNOSIS — Z760 Encounter for issue of repeat prescription: Secondary | ICD-10-CM

## 2019-04-25 DIAGNOSIS — L02213 Cutaneous abscess of chest wall: Secondary | ICD-10-CM

## 2019-04-25 DIAGNOSIS — E785 Hyperlipidemia, unspecified: Secondary | ICD-10-CM

## 2019-04-25 DIAGNOSIS — Z09 Encounter for follow-up examination after completed treatment for conditions other than malignant neoplasm: Secondary | ICD-10-CM

## 2019-04-25 DIAGNOSIS — I10 Essential (primary) hypertension: Secondary | ICD-10-CM

## 2019-04-25 MED ORDER — AMLODIPINE BESYLATE 10 MG PO TABS: 10.0000 mg | ORAL_TABLET | Freq: Every day | ORAL | 3 refills | Status: DC

## 2019-04-25 MED ORDER — ATORVASTATIN CALCIUM 40 MG PO TABS: 40.0000 mg | ORAL_TABLET | Freq: Every day | ORAL | 3 refills | Status: DC

## 2019-04-25 MED ORDER — LOSARTAN POTASSIUM 25 MG PO TABS: 25.0000 mg | ORAL_TABLET | Freq: Every day | ORAL | 3 refills | Status: DC

## 2019-04-25 MED ORDER — CARVEDILOL 6.25 MG PO TABS: 6.2500 mg | ORAL_TABLET | Freq: Two times a day (BID) | ORAL | 3 refills | Status: DC

## 2019-04-25 MED FILL — ?ATORVASTATIN 40MG TABLET: 40 | 30 days supply | Qty: 30 | Fill #0

## 2019-04-25 MED FILL — ?CARVEDILOL 6.25 MG TABLET: 6.25 | 30 days supply | Qty: 60 | Fill #0

## 2019-04-25 MED FILL — LOSARTAN POTASSIUM 25 MG TA: 25 | 30 days supply | Qty: 30 | Fill #0

## 2019-04-25 MED FILL — ?AMLODIPINE BESYLATE 10 MG: 10 | 30 days supply | Qty: 30 | Fill #0

## 2019-04-25 NOTE — Progress Notes (Signed)
Acute Office Visit  Subjective:    Patient ID: Rachael Stone, female    DOB: 11/05/1965, 53 y.o.   MRN: [id number]  Chief Complaint  Patient presents with  . breast abcess    HPI Patient is in today for re -evaluation of abscess on her left chest. She also expressed concerns of numbness and tingling only on her left side as though she slept on it and has not woken up. She denies chest pain any radiating pain to her neck back or shortness of breath, nausea and  Vomiting.   Past Medical History:  Diagnosis Date  . Back pain   . Cardiomyopathy ([place])    a. 04/08/2016 Echo: EF 35%, diff HK, mild LVH, Gr2 DD, mod AI, mod to sev MR, mildly dil LA.  [name] ETOH abuse   . Hypertensive heart disease with heart failure ([place])   . Marijuana abuse   . Moderate aortic insufficiency    a. 04/08/2016 Echo: mod AI.  [name] Moderate to Severe Mitral Regurgitation    a. 04/08/2016 Echo: mod-sev MR directed centrally.  . Tobacco abuse     Past Surgical History:  Procedure Laterality Date  . CARDIAC CATHETERIZATION N/A 04/10/2016   Procedure: Right/Left Heart Cath and Coronary Angiography;  Surgeon: [name], MD;  Location: [facility];  Service: Cardiovascular;  Laterality: N/A;  . EXPLORATORY LAPAROTOMY  2003   Ectopic Pregnancy - unsure which side or if tube was removed  . TEE WITHOUT CARDIOVERSION N/A 06/26/2017   Procedure: TRANSESOPHAGEAL ECHOCARDIOGRAM (TEE);  Surgeon: [name], MD;  Location: [facility];  Service: Cardiovascular;  Laterality: N/A;  . TUBAL LIGATION      Family History  Problem Relation Age of Onset  . CAD Mother        First MI @ 57 - 5 total.  . Cirrhosis Father        alcoholic - died in his 10'G.  . Lupus Sister     Social History   Socioeconomic History  . Marital status: Single    Spouse name: Not on file  . Number of children: Not on file  . Years of education: Not on file  . Highest education level: Not on file  Occupational  History  . Occupation: [employer]  . Financial resource strain: Not on file  . Food insecurity    Worry: Not on file    Inability: Not on file  . Transportation needs    Medical: Not on file    Non-medical: Not on file  Tobacco Use  . Smoking status: Current Every Day Smoker    Packs/day: 0.50    Years: 15.00    Pack years: 7.50    Types: Cigarettes  . Smokeless tobacco: Never Used  Substance and Sexual Activity  . Alcohol use: Yes    Comment: At least 12 ounces of homemade moon shine every other day.  . Drug use: Yes    Types: Marijuana    Comment: Smokes marijuana daily.  Used to smoke cocaine laced  marijuana cigarettes for 15-20 yrs but quit cocaine 6 yrs ago.  [name] Sexual activity: Yes    Birth control/protection: Other-see comments    Comment: BTL  Lifestyle  . Physical activity    Days per week: Not on file    Minutes per session: Not on file  . Stress: Not on file  Relationships  . Social connections    Talks on phone: Not on  file    Gets together: Not on file    Attends religious service: Not on file    Active member of club or organization: Not on file    Attends meetings of clubs or organizations: Not on file    Relationship status: Not on file  . Intimate partner violence    Fear of current or ex partner: Not on file    Emotionally abused: Not on file    Physically abused: Not on file    Forced sexual activity: Not on file  Other Topics Concern  . Not on file  Social History Narrative   Lives in [place] with her fiance.  Does not routinely exercise.    Outpatient Medications Prior to Visit  Medication Sig Dispense Refill  . albuterol (VENTOLIN HFA) 108 (90 Base) MCG/ACT inhaler Inhale 1-2 puffs into the lungs every 6 (six) hours as needed for wheezing or shortness of breath. 8 g 1  . furosemide (LASIX) 20 MG tablet Take 1 tablet (20 mg total) by mouth daily. 30 tablet 3  . amLODipine (NORVASC) 10 MG tablet Take 1 tablet (10 mg total) by mouth daily.  30 tablet 3  . atorvastatin (LIPITOR) 40 MG tablet Take 1 tablet (40 mg total) by mouth daily. 30 tablet 3  . carvedilol (COREG) 6.25 MG tablet Take 1 tablet (6.25 mg total) by mouth 2 (two) times daily. 60 tablet 3  . losartan (COZAAR) 25 MG tablet Take 1 tablet (25 mg total) by mouth daily. 30 tablet 3  . aspirin 81 MG chewable tablet Chew 81 mg by mouth daily.    . hydrOXYzine (ATARAX/VISTARIL) 25 MG tablet Take 0.5-1 tablets (12.5-25 mg total) by mouth every 8 (eight) hours as needed for itching. (Patient not taking: Reported on 04/25/2019) 30 tablet 0  . potassium chloride 20 MEQ TBCR Take 40 mEq by mouth 2 (two) times daily for 5 days. 20 tablet 0  . triamcinolone cream (KENALOG) 0.1 % Apply 1 application topically 2 (two) times daily. 30 g 0  . ibuprofen (ADVIL,MOTRIN) 200 MG tablet Take 400 mg by mouth every 6 (six) hours as needed for headache or mild pain.    [name] omeprazole (PRILOSEC) 20 MG capsule Take 1 capsule (20 mg total) by mouth daily. (Patient not taking: Reported on 01/18/2019) 20 capsule 0  . ondansetron (ZOFRAN ODT) 4 MG disintegrating tablet Take 1 tablet (4 mg total) by mouth every 8 (eight) hours as needed for nausea or vomiting. 20 tablet 0  . pantoprazole (PROTONIX) 20 MG tablet Take 1 tablet (20 mg total) by mouth daily. (Patient not taking: Reported on 12/29/2018) 30 tablet 1  . potassium chloride SA (K-DUR) 20 MEQ tablet Take 1 tablet (20 mEq total) by mouth 2 (two) times daily. 30 tablet 0   No facility-administered medications prior to visit.     Allergies  Allergen Reactions  . Ibuprofen Other (See Comments)    Irritates stomach    Review of Systems  Skin:       Abscess with drainage present some remove with squeezing    Neurological: Positive for tingling and weakness.  All other systems reviewed and are negative.      Objective:    Physical Exam  Constitutional: She is oriented to person, place, and time. She appears well-developed and well-nourished.   HENT:  Head: Normocephalic.  Cardiovascular: Normal rate and regular rhythm.  Pulmonary/Chest: Effort normal and breath sounds normal.  Abdominal: Soft. Bowel sounds are normal.  Neurological: She  is oriented to person, place, and time.  Skin:  abscess remains present with drianage    BP 131/73 (BP Location: Right Arm, Patient Position: Sitting, Cuff Size: Normal)   Pulse 89   Temp (!) 97.3 F (36.3 C) (Temporal)   Ht 5\' 5"  (1.651 m)   Wt 143 lb 6.4 oz (65 kg)   SpO2 99%   BMI 23.86 kg/m  Wt Readings from Last 3 Encounters:  04/25/19 143 lb 6.4 oz (65 kg)  03/19/19 153 lb (69.4 kg)  02/16/19 153 lb (69.4 kg)    Health Maintenance Due  Topic Date Due  . MAMMOGRAM  02/02/2018  . COLONOSCOPY  02/02/2018  . INFLUENZA VACCINE  02/12/2019    There are no preventive care reminders to display for this patient.   Lab Results  Component Value Date   TSH 0.717 01/22/2017   Lab Results  Component Value Date   WBC 8.2 03/19/2019   HGB 12.9 03/19/2019   HCT 38.3 03/19/2019   MCV 86.3 03/19/2019   PLT 287 03/19/2019   Lab Results  Component Value Date   NA 137 03/19/2019   K 2.7 (LL) 03/19/2019   CO2 26 03/19/2019   GLUCOSE 138 (H) 03/19/2019   BUN 5 (L) 03/19/2019   CREATININE 0.70 03/19/2019   BILITOT 0.3 03/19/2019   ALKPHOS 54 03/19/2019   AST 25 03/19/2019   ALT 21 03/19/2019   PROT 7.8 03/19/2019   ALBUMIN 4.0 03/19/2019   CALCIUM 9.5 03/19/2019   ANIONGAP 13 03/19/2019   Lab Results  Component Value Date   CHOL 147 11/29/2018   Lab Results  Component Value Date   HDL 52 11/29/2018   Lab Results  Component Value Date   LDLCALC 60 11/29/2018   Lab Results  Component Value Date   TRIG 177 (H) 11/29/2018   Lab Results  Component Value Date   CHOLHDL 2.8 11/29/2018   Lab Results  Component Value Date   HGBA1C 5.3 01/10/2019       Assessment & Plan:  Viona was seen today for breast abcess.  Diagnoses and all orders for this  visit:  Hospital discharge follow-up Re-evaluation of abscess after treatment with antibiotics. Drainage remains present advised to use warm wash cloth to have sebaceous cyst to surface.  Essential hypertension -     amLODipine (NORVASC) 10 MG tablet; Take 1 tablet (10 mg total) by mouth daily. -     carvedilol (COREG) 6.25 MG tablet; Take 1 tablet (6.25 mg total) by mouth 2 (two) times daily.  Medication refill -     atorvastatin (LIPITOR) 40 MG tablet; Take 1 tablet (40 mg total) by mouth daily.  Hypertension, unspecified type Counseled on blood pressure goal of less than 130/80, low-sodium, DASH diet, medication compliance, 150 minutes of moderate intensity exercise per week. Discussed medication compliance, adverse effects. -     losartan (COZAAR) 25 MG tablet; Take 1 tablet (25 mg total) by mouth daily.  Hyperlipidemia, unspecified hyperlipidemia type -     Lipid Panel  Encounter for screening mammogram for malignant neoplasm of breast Patient completed application for [facility] while in clinic and application has and faxed to [facility]. Patient aware that [facility] will contact her directly to schedule appointment.   Meds ordered this encounter  Medications  . amLODipine (NORVASC) 10 MG tablet    Sig: Take 1 tablet (10 mg total) by mouth daily.    Dispense:  30 tablet    Refill:  3  .  atorvastatin (LIPITOR) 40 MG tablet    Sig: Take 1 tablet (40 mg total) by mouth daily.    Dispense:  30 tablet    Refill:  3  . carvedilol (COREG) 6.25 MG tablet    Sig: Take 1 tablet (6.25 mg total) by mouth 2 (two) times daily.    Dispense:  60 tablet    Refill:  3  . losartan (COZAAR) 25 MG tablet    Sig: Take 1 tablet (25 mg total) by mouth daily.    Dispense:  30 tablet    Refill:  3     Kerin Perna, NP

## 2019-04-25 NOTE — Patient Instructions (Signed)
Skin Abscess  A skin abscess is an infected area on or under your skin that contains a collection of pus and other material. An abscess may also be called a furuncle, carbuncle, or boil. An abscess can occur in or on almost any part of your body. Some abscesses break open (rupture) on their own. Most continue to get worse unless they are treated. The infection can spread deeper into the body and eventually into your blood, which can make you feel ill. Treatment usually involves draining the abscess. What are the causes? An abscess occurs when germs, like bacteria, pass through your skin and cause an infection. This may be caused by:  A scrape or cut on your skin.  A puncture wound through your skin, including a needle injection or insect bite.  Blocked oil or sweat glands.  Blocked and infected hair follicles.  A cyst that forms beneath your skin (sebaceous cyst) and becomes infected. What increases the risk? This condition is more likely to develop in people who:  Have a weak body defense system (immune system).  Have diabetes.  Have dry and irritated skin.  Get frequent injections or use illegal IV drugs.  Have a foreign body in a wound, such as a splinter.  Have problems with their lymph system or veins. What are the signs or symptoms? Symptoms of this condition include:  A painful, firm bump under the skin.  A bump with pus at the top. This may break through the skin and drain. Other symptoms include:  Redness surrounding the abscess site.  Warmth.  Swelling of the lymph nodes (glands) near the abscess.  Tenderness.  A sore on the skin. How is this diagnosed? This condition may be diagnosed based on:  A physical exam.  Your medical history.  A sample of pus. This may be used to find out what is causing the infection.  Blood tests.  Imaging tests, such as an ultrasound, CT scan, or MRI. How is this treated? A small abscess that drains on its own may not  need treatment. Treatment for larger abscesses may include:  Moist heat or heat pack applied to the area several times a day.  A procedure to drain the abscess (incision and drainage).  Antibiotic medicines. For a severe abscess, you may first get antibiotics through an IV and then change to antibiotics by mouth. Follow these instructions at home: Medicines   Take over-the-counter and prescription medicines only as told by your health care provider.  If you were prescribed an antibiotic medicine, take it as told by your health care provider. Do not stop taking the antibiotic even if you start to feel better. Abscess care   If you have an abscess that has not drained, apply heat to the affected area. Use the heat source that your health care provider recommends, such as a moist heat pack or a heating pad. ? Place a towel between your skin and the heat source. ? Leave the heat on for 20-30 minutes. ? Remove the heat if your skin turns bright red. This is especially important if you are unable to feel pain, heat, or cold. You may have a greater risk of getting burned.  Follow instructions from your health care provider about how to take care of your abscess. Make sure you: ? Cover the abscess with a bandage (dressing). ? Change your dressing or gauze as told by your health care provider. ? Wash your hands with soap and water before you change the   dressing or gauze. If soap and water are not available, use hand sanitizer.  Check your abscess every day for signs of a worsening infection. Check for: ? More redness, swelling, or pain. ? More fluid or blood. ? Warmth. ? More pus or a bad smell. General instructions  To avoid spreading the infection: ? Do not share personal care items, towels, or hot tubs with others. ? Avoid making skin contact with other people.  Keep all follow-up visits as told by your health care provider. This is important. Contact a health care provider if you  have:  More redness, swelling, or pain around your abscess.  More fluid or blood coming from your abscess.  Warm skin around your abscess.  More pus or a bad smell coming from your abscess.  A fever.  Muscle aches.  Chills or a general ill feeling. Get help right away if you:  Have severe pain.  See red streaks on your skin spreading away from the abscess. Summary  A skin abscess is an infected area on or under your skin that contains a collection of pus and other material.  A small abscess that drains on its own may not need treatment.  Treatment for larger abscesses may include having a procedure to drain the abscess and taking an antibiotic. This information is not intended to replace advice given to you by your health care provider. Make sure you discuss any questions you have with your health care provider. Document Released: 04/09/2005 Document Revised: 10/21/2018 Document Reviewed: 08/13/2017 Elsevier Patient Education  2020 [employer]  

## 2019-04-25 NOTE — Progress Notes (Signed)
Numbness in hands since being on abx

## 2019-04-26 ENCOUNTER — Telehealth (INDEPENDENT_AMBULATORY_CARE_PROVIDER_SITE_OTHER): Payer: Self-pay

## 2019-04-26 LAB — LIPID PANEL
Chol/HDL Ratio: 3.1 ratio (ref 0.0–4.4)
Cholesterol, Total: 162 mg/dL (ref 100–199)
HDL: 52 mg/dL (ref >39)
LDL Chol Calc (NIH): 87 mg/dL (ref 0–99)
Triglycerides: 133 mg/dL (ref 0–149)
VLDL Cholesterol Cal: 23 mg/dL (ref 5–40)

## 2019-04-26 MED FILL — ?ATORVASTATIN 40MG TABLET: 40 | 30 days supply | Qty: 30 | Fill #2

## 2019-04-26 NOTE — Telephone Encounter (Signed)
Patient is aware that cholesterol is elevated. Advised patient to take atorvastatin nightly and decrease fatty foods, red meat, milk and cheese and eat more veggies and whole grains. Patient expressed understanding. Nat Christen, CMA

## 2019-04-26 NOTE — Telephone Encounter (Signed)
-----   Message from Kerin Perna, NP sent at 04/26/2019  8:45 AM EDT ----- Decrease  fatty foods, red meat, cheese, milk and increase fiber like whole grains and veggies. Take your atorvastatin 40mg  as prescribed more effective after dinner meal

## 2019-05-31 ENCOUNTER — Telehealth: Payer: Self-pay | Admitting: Cardiovascular Disease

## 2019-05-31 NOTE — Telephone Encounter (Signed)
New Message  Pt c/o Shortness Of Breath: STAT if SOB developed within the last 24 hours or pt is noticeably SOB on the phone  1. Are you currently SOB (can you hear that pt is SOB on the phone)? Yes  2. How long have you been experiencing SOB? A month  3. Are you SOB when sitting or when up moving around? Up and moving around  4. Are you currently experiencing any other symptoms? Right leg tingling/numbness, right arm tingling/numbness, left hand tingling/numbness, SOB on exertion.   Scheduled appointment with Dr. Acie Fredrickson on 06/21/19 at 11:40 am

## 2019-05-31 NOTE — Telephone Encounter (Signed)
Pt called to report that she has been having SOB with exertion over the past month.  She denies edema, chest pain, dizziness, cough. She has been complaining of "tingling" in her right leg and arm.  She saw her PCP recently and was advised to monitor it and to call back if it worsens.. she has a history of osteoarthritis.   Pt says it lasts for just a few minutes and she cannot catch her breath but it gets better with rest. She reports that when she used an inhaler in the past it helped but her PCP took her off of it and she is not sure why. She also say that she can lay flat to sleep at night without difficulty.   Her next OV with Dr. Acie Fredrickson is 06/21/19... I have advised her to talk with her PCP again about her symptoms and about the MDI.Marland KitchenMarland Kitchen I will also forward to Dr. Dahlia Client to see if he would like her to be seen sooner than her 06/21/19 planned visit with him. App availability for add on pts is very limited this week and next week.

## 2019-05-31 NOTE — Telephone Encounter (Signed)
Has she been taking all her meds . ? Staying away from salt  She should discuss the need for her inhaler with her primary .

## 2019-05-31 NOTE — Telephone Encounter (Signed)
Called the pt back per Dr. Acie Fredrickson and she reports she has been taking all of her meds/ Lasix without any missed doses.   She will talk with her PCP about her MDI and will keep her appt with Dr. Acie Fredrickson 06/21/19. Pt will call if anything worsens.   Pt and I discussed low sodium diet and foods to be cautious of. Pt verbalized understanding.

## 2019-06-21 ENCOUNTER — Encounter: Payer: Self-pay | Admitting: Cardiovascular Disease

## 2019-06-21 ENCOUNTER — Ambulatory Visit (INDEPENDENT_AMBULATORY_CARE_PROVIDER_SITE_OTHER): Payer: Self-pay | Admitting: Cardiovascular Disease

## 2019-06-21 ENCOUNTER — Other Ambulatory Visit: Payer: Self-pay

## 2019-06-21 DIAGNOSIS — I10 Essential (primary) hypertension: Secondary | ICD-10-CM

## 2019-06-21 MED ORDER — SPIRONOLACTONE 25 MG PO TABS: 25.0000 mg | ORAL_TABLET | Freq: Every day | ORAL | 3 refills | Status: DC

## 2019-06-21 MED ORDER — POTASSIUM CHLORIDE CRYS ER 20 MEQ PO TBCR: 20.0000 meq | EXTENDED_RELEASE_TABLET | Freq: Every day | ORAL | 3 refills | Status: DC

## 2019-06-21 MED ORDER — LOSARTAN POTASSIUM 50 MG PO TABS: 50.0000 mg | ORAL_TABLET | Freq: Every day | ORAL | 3 refills | Status: DC

## 2019-06-21 MED ORDER — AMLODIPINE BESYLATE 5 MG PO TABS: 5.0000 mg | ORAL_TABLET | Freq: Every day | ORAL | 3 refills | Status: DC

## 2019-06-21 MED FILL — ?SPIRONOLACTONE 25 MG TABLE: 25 | 30 days supply | Qty: 30 | Fill #0

## 2019-06-21 MED FILL — POTASSIUM CL ER 20 MEQ TAB: 20 | 30 days supply | Qty: 30 | Fill #0

## 2019-06-21 MED FILL — LOSARTAN POTASSIUM 50 MG TA: 50 | 30 days supply | Qty: 30 | Fill #0

## 2019-06-21 MED FILL — ?AMLODIPINE BESYLATE 5 MG T: 5 MG | 30 days supply | Qty: 30 | Fill #0

## 2019-06-21 NOTE — Progress Notes (Signed)
Cardiology Office Note   Date:  06/21/2019   ID:  Rachael Stone, DOB May 28, 1966, MRN [id number]  PCP:  Kerin Perna, NP  Cardiologist:  Dr. Acie Fredrickson    Chief Complaint  Patient presents with  . Congestive Heart Failure  . Hypertension      Notes from [name]: Rachael Stone is a 53 y.o. female who presents for post hospital for progressive dyspnea and orthopnea and has been found to have LV dysfunction (EF 35%), mod AI, and mod - sev MR.  She has a  h/o tob, alcohol, marijuana, and remote cocaine abuse.  She has no prior cardiac hx.  She had CTA of chest without PE + pulmonary edema and LUL nodule of 11mm.  Echo with EF 30% and mod-sev MR and mod AI. Marland Stone    She had R & L cardiac cath   LV end diastolic pressure is normal.  There is moderate left ventricular systolic dysfunction.  The left ventricular ejection fraction is 35% by visual estimate.  There is no aortic valve stenosis.  LV end diastolic pressure is normal  Normal coronary arteries.  She has done well no further SOB, swelling, which was in her abd or chest pain.  She has decreased cigarettes to 5 per day down from 10 per day.  Could not afford nicotene patches.  She has cut out salt in her diet.  Unfortunately she has not taken her meds for 2 days, she is worried they will hurt her heart.  We had a long discussion on what happened to her heart and the weakness of her ventricle. We discussed how important the meds were to improve her heart.     Feb.   2, 2018: Seen with her sister, [name].   She stopped her meds when they ran out in Dec.   Says she felt better off the meds.   She says that she does not have any symptoms.   Does not want to restart meds.   Saw [name] who refilled her meds.  Patient did not Get her medications filled. She states that she does not understand why she needs to take these medications and does not want to take medications or her life.  Nov 14, 2016:  Madelaine is  seen today with her sister, [name].  No CP or dyspnea.    Donates plasma twice a week.   BP is typically normal there  Dec. 6,2018:  Caera had an echocardiogram recently that revealed worsening systolic congestive heart failure and moderate to severe aortic insufficiency. Seen with her sister , [name]  No real increase in dyspnea.   September 11, 2017:   Rachael Stone is seen by her self today . Seems to be doing well   Has mild dyspnea.   Had a TEE  Her ejection fraction is now normal with an EF of 55-60%.  She has moderate aortic insufficiency, mild mitral regurgitation Her left ventricular function has improved and her aortic insufficiency and mitral regurgitation have both improved as well since her previous echo.  Complains about having to urinate so much with the lasix   Dec. 8 2020 :  Rachael Stone is seen today for follow-up of her congestive heart failure, mild mitral vegetation, hyperlipidemia.  She also has mild to moderate aortic insufficiency. Avoids salty foods.   Past Medical History:  Diagnosis Date  . Back pain   . Cardiomyopathy ([address])    a. 04/08/2016 Echo: EF 35%, diff HK, mild LVH,  Gr2 DD, mod AI, mod to sev MR, mildly dil LA.  Marland Stone ETOH abuse   . Hypertensive heart disease with heart failure ([place])   . Marijuana abuse   . Moderate aortic insufficiency    a. 04/08/2016 Echo: mod AI.  Marland Stone Moderate to Severe Mitral Regurgitation    a. 04/08/2016 Echo: mod-sev MR directed centrally.  . Tobacco abuse     Past Surgical History:  Procedure Laterality Date  . CARDIAC CATHETERIZATION N/A 04/10/2016   Procedure: Right/Left Heart Cath and Coronary Angiography;  Surgeon: [name], MD;  Location: [facility];  Service: Cardiovascular;  Laterality: N/A;  . EXPLORATORY LAPAROTOMY  2003   Ectopic Pregnancy - unsure which side or if tube was removed  . TEE WITHOUT CARDIOVERSION N/A 06/26/2017   Procedure: TRANSESOPHAGEAL ECHOCARDIOGRAM (TEE);  Surgeon: [name],  MD;  Location: [facility] ENDOSCOPY;  Service: Cardiovascular;  Laterality: N/A;  . TUBAL LIGATION       Current Outpatient Medications  Medication Sig Dispense Refill  . amLODipine (NORVASC) 10 MG tablet Take 1 tablet (10 mg total) by mouth daily. 30 tablet 3  . aspirin 81 MG chewable tablet Chew 81 mg by mouth daily.    Marland Stone atorvastatin (LIPITOR) 40 MG tablet Take 1 tablet (40 mg total) by mouth daily. 30 tablet 3  . carvedilol (COREG) 6.25 MG tablet Take 1 tablet (6.25 mg total) by mouth 2 (two) times daily. 60 tablet 3  . furosemide (LASIX) 20 MG tablet Take 1 tablet (20 mg total) by mouth daily. 30 tablet 3  . losartan (COZAAR) 25 MG tablet Take 1 tablet (25 mg total) by mouth daily. 30 tablet 3  . potassium chloride 20 MEQ TBCR Take 40 mEq by mouth 2 (two) times daily for 5 days. 20 tablet 0   No current facility-administered medications for this visit.     Allergies:   Ibuprofen    Social History:  The patient  reports that she has been smoking cigarettes. She has a 7.50 pack-year smoking history. She has never used smokeless tobacco. She reports current alcohol use. She reports current drug use. Drug: Marijuana.   Family History:  The patient's family history includes CAD in her mother; Cirrhosis in her father; Lupus in her sister.    ROS   - negative   Wt Readings from Last 3 Encounters:  06/21/19 149 lb (67.6 kg)  04/25/19 143 lb 6.4 oz (65 kg)  03/19/19 153 lb (69.4 kg)     Physical Exam: Blood pressure 134/60, pulse 86, height 5\' 5"  (1.651 m), weight 149 lb (67.6 kg), SpO2 99 %.  GEN:  Well nourished, well developed in no acute distress HEENT: Normal NECK: No JVD; No carotid bruits LYMPHATICS: No lymphadenopathy CARDIAC: RRR , no murmurs, rubs, gallops RESPIRATORY:  Clear to auscultation without rales, wheezing or rhonchi  ABDOMEN: Soft, non-tender, non-distended MUSCULOSKELETAL:  No edema; No deformity  SKIN: Warm and dry NEUROLOGIC:  Alert and oriented x 3      EKG:      Recent Labs: 03/19/2019: ALT 21; B Natriuretic Peptide 14.7; BUN 5; Creatinine, Ser 0.70; Hemoglobin 12.9; Magnesium 2.0; Platelets 287; Potassium 2.7; Sodium 137    Lipid Panel    Component Value Date/Time   CHOL 162 04/25/2019 1045   TRIG 133 04/25/2019 1045   HDL 52 04/25/2019 1045   CHOLHDL 3.1 04/25/2019 1045   CHOLHDL 4.0 Ratio 11/12/2007 2044   VLDL 32 11/12/2007 2044   LDLCALC 87  04/25/2019 1045       Other studies Reviewed: Additional studies/ records that were reviewed today include: .  Cardiac Cath 04/10/16 Conclusion     LV end diastolic pressure is normal.  There is moderate left ventricular systolic dysfunction.  The left ventricular ejection fraction is 35% by visual estimate.  There is no aortic valve stenosis.  LV end diastolic pressure is normal.   Nonischemic cardiomyopathy.  Continue medical therapy.   Indications   Acute combined systolic and diastolic heart failure (HCC) [I50.41 (ICD-10-CM)]  Dyspnea [R06.00 (ICD-10-CM)]   Hemo Data   Flowsheet Row Most Recent Value  Fick Cardiac Output 4.36 L/min  Fick Cardiac Output Index 2.46 (L/min)/BSA  RA A Wave 9 mmHg  RA V Wave 6 mmHg  RA Mean 4 mmHg  RV Systolic Pressure 28 mmHg  RV Diastolic Pressure 3 mmHg  RV EDP 6 mmHg  PA Systolic Pressure 28 mmHg  PA Diastolic Pressure 13 mmHg  PA Mean 20 mmHg  PW A Wave 10 mmHg  PW V Wave 9 mmHg  PW Mean 8 mmHg  AO Systolic Pressure 967 mmHg  AO Diastolic Pressure 77 mmHg  AO Mean 99 mmHg  LV Systolic Pressure 893 mmHg  LV Diastolic Pressure 7 mmHg  LV EDP 12 mmHg  Arterial Occlusion Pressure Extended Systolic Pressure 810 mmHg  Arterial Occlusion Pressure Extended Diastolic Pressure 76 mmHg  Arterial Occlusion Pressure Extended Mean Pressure 98 mmHg  Left Ventricular Apex Extended Systolic Pressure 175 mmHg  Left Ventricular Apex Extended Diastolic Pressure 8 mmHg  Left Ventricular Apex Extended EDP Pressure 9 mmHg  QP/QS 1   TPVR Index 8.13 HRUI  TSVR Index 40.21 HRUI  PVR SVR Ratio 0.13  TPVR/TSVR Ratio    ECHO: 04/08/16 Study Conclusions  - Left ventricle: The cavity size was mildly dilated. Wall   thickness was increased in a pattern of mild LVH. The estimated   ejection fraction was 35%. Diffuse hypokinesis. Features are   consistent with a pseudonormal left ventricular filling pattern,   with concomitant abnormal relaxation and increased filling   pressure (grade 2 diastolic dysfunction). - Aortic valve: Trileaflet; mildly calcified leaflets. There was no   stenosis. There was moderate regurgitation. There was no   holodiastolic flow reversal noted in the descending thoracic   aorta. - Mitral valve: There was moderate to severe regurgitation directed   centrally. - Left atrium: The atrium was mildly dilated. - Right ventricle: The cavity size was normal. Systolic function   was normal. - Pulmonary arteries: No complete TR doppler jet so unable to   estimate PA systolic pressure. - Systemic veins: IVC measured 1.7 cm with < 50% respirophasic   variation, suggesting RA pressure 8 mmHg.  Impressions:  - Mildly dilated LV with mild LV hypertrophy. EF 35%, diffuse   hypokinesis. Moderate diastolic dysfunction. Moderate aortic   insufficiency with trileaflet aortic valve. Moderate to severe   mitral regurgitation. MR was central, no definite leaflet   abnormalities noted.    ASSESSMENT AND PLAN:  1.  Acute combined systolic and diastolic heart failure ([place])-    EF has improved.  Cont current meds She has mild to moderate aortic insufficiency and mild mitral vegetation.   2.  Hypertensive heart disease with heart failure (HCC)  BP is well controlled.   3.   NICM (nonischemic cardiomyopathy) ([place])     4. aortic insufficiency-   Stable   5.   Mitral Regurgitation-   Stable   6.  Tobacco abuse.:   Advised cessation   7.   Normal coronary arteries    Mertie Moores, MD  06/21/2019  11:54 AM    [facility] [place],  [place],   [address] Pager [phone] Phone: [phone]; Fax: [phone]

## 2019-06-21 NOTE — Patient Instructions (Addendum)
Medication Instructions:  Your physician has recommended you make the following change in your medication:  STOP Lasix (Furosemide) DECREASE Amlodipine to 5 mg once daily DECREASE Potassium chloride (Kdur) to 20 mEq once daily INCREASE Losartan (Cozaar) to 50 mg once daily START Spironolactone (Aldactone) 25 mg once daily  *If you need a refill on your cardiac medications before your next appointment, please call your pharmacy*  Lab Work: Your physician recommends that you return for lab work on Tuesday Dec. 15 for basic metabolic panel You do not have to fast and can come in anytime between 7:30 am and 4:45 pm   Testing/Procedures: None Ordered   Follow-Up: At [facility], you and your health needs are our priority.  As part of our continuing mission to provide you with exceptional heart care, we have created designated Provider Care Teams.  These Care Teams include your primary Cardiologist (physician) and Advanced Practice Providers (APPs -  Physician Assistants and Nurse Practitioners) who all work together to provide you with the care you need, when you need it.  Your next appointment:   6 week(s) on January 7 at 3:30 pm  The format for your next appointment:   In Person  Provider:   Daune Perch, NP

## 2019-06-28 ENCOUNTER — Other Ambulatory Visit: Payer: Self-pay

## 2019-06-29 ENCOUNTER — Other Ambulatory Visit: Payer: Self-pay | Admitting: *Deleted

## 2019-06-29 ENCOUNTER — Other Ambulatory Visit: Payer: Self-pay

## 2019-06-29 DIAGNOSIS — I10 Essential (primary) hypertension: Secondary | ICD-10-CM

## 2019-06-29 LAB — BASIC METABOLIC PANEL
BUN/Creatinine Ratio: 8 — ABNORMAL LOW (ref 9–23)
BUN: 7 mg/dL (ref 6–24)
CO2: 22 mmol/L (ref 20–29)
Calcium: 10 mg/dL (ref 8.7–10.2)
Chloride: 103 mmol/L (ref 96–106)
Creatinine, Ser: 0.84 mg/dL (ref 0.57–1.00)
GFR calc Af Amer: 93 mL/min/{1.73_m2} (ref >59)
GFR calc non Af Amer: 81 mL/min/{1.73_m2} (ref >59)
Glucose: 86 mg/dL (ref 65–99)
Potassium: 4.1 mmol/L (ref 3.5–5.2)
Sodium: 142 mmol/L (ref 134–144)

## 2019-07-12 ENCOUNTER — Encounter: Payer: Self-pay | Admitting: *Deleted

## 2019-07-21 ENCOUNTER — Ambulatory Visit: Payer: Medicaid Other | Admitting: Cardiology

## 2019-07-21 NOTE — Progress Notes (Deleted)
Cardiology Office Note:    Date:  07/21/2019   ID:  Rachael Stone, DOB [birth year], MRN [id number]  PCP:  Kerin Perna, NP  Cardiologist:  Mertie Moores, MD  Referring MD: Kerin Perna, NP   No chief complaint on file. ***  History of Present Illness:    Rachael Stone is a 54 y.o. female with a past medical history significant for nonischemic cardiomyopathy, mild-moderate aortic insufficiency, mild mitral regurgitation, tobacco abuse.  She also has a history of alcohol and marijuana use, remote cocaine use.  The patient has a history of LV dysfunction with EF 35% in 2017.  Right and left heart cath in 2017 showed patent coronary arteries and normal LV end-diastolic pressure.  Echocardiogram in 05/2017 showed EF returned to 55-60% with grade 2 diastolic dysfunction, moderate to severe aortic regurgitation and mild MR.  TEE was done on 06/26/2017 that showed EF 55-60%, moderate aortic regurgitation and mild MR.   Cardiac studies   TEE 06/26/2017 Study Conclusions - Left ventricle: Systolic function was normal. The estimated   ejection fraction was in the range of 55% to 60%. - Aortic valve: There was moderate regurgitation. - Mitral valve: There was mild regurgitation. - Left atrium: No evidence of thrombus in the atrial cavity or   appendage. - Atrial septum: No defect or patent foramen ovale was identified. - Tricuspid valve: No evidence of vegetation.  Echocardiogram 06/09/2017: EF 55-60% with grade 2 diastolic dysfunction, mild MR, moderate-severe aortic regurgitation  Right/Left Heart Cath and Coronary Angiography 04/10/2016  Conclusion    LV end diastolic pressure is normal.  There is moderate left ventricular systolic dysfunction.  The left ventricular ejection fraction is 35% by visual estimate.  There is no aortic valve stenosis.  LV end diastolic pressure is normal.   Nonischemic cardiomyopathy.  Continue medical therapy.    Echocardiogram 04/08/2016: EF 35%, moderate-severe MR, moderate aortic regurgitation   Past Medical History:  Diagnosis Date  . Back pain   . Cardiomyopathy ([place])    a. 04/08/2016 Echo: EF 35%, diff HK, mild LVH, Gr2 DD, mod AI, mod to sev MR, mildly dil LA.  [name] DEPRESSIVE DISORDER NOT ELSEWHERE CLASSIFIED 12/14/2008   Qualifier: Diagnosis of  By: [name], Junie Panning    . DYSFUNCTIONAL UTERINE BLEEDING 11/12/2007   Qualifier: Diagnosis of  By: [name], Junie Panning    . ETOH abuse   . Hypertensive heart disease with heart failure ([place])   . Marijuana abuse   . MENORRHAGIA 12/14/2008   Qualifier: Diagnosis of  By: [name], Junie Panning    . Moderate aortic insufficiency    a. 04/08/2016 Echo: mod AI.  [name] Moderate to Severe Mitral Regurgitation    a. 04/08/2016 Echo: mod-sev MR directed centrally.  . Tobacco abuse     Past Surgical History:  Procedure Laterality Date  . CARDIAC CATHETERIZATION N/A 04/10/2016   Procedure: Right/Left Heart Cath and Coronary Angiography;  Surgeon: [name], MD;  Location: [facility];  Service: Cardiovascular;  Laterality: N/A;  . EXPLORATORY LAPAROTOMY  2003   Ectopic Pregnancy - unsure which side or if tube was removed  . TEE WITHOUT CARDIOVERSION N/A 06/26/2017   Procedure: TRANSESOPHAGEAL ECHOCARDIOGRAM (TEE);  Surgeon: [name], MD;  Location: [facility] ENDOSCOPY;  Service: Cardiovascular;  Laterality: N/A;  . TUBAL LIGATION      Current Medications: No outpatient medications have been marked as taking for the 07/21/19 encounter (Appointment) with Daune Perch, NP.  Allergies:   Ibuprofen   Social History   Socioeconomic History  . Marital status: Single    Spouse name: Not on file  . Number of children: Not on file  . Years of education: Not on file  . Highest education level: Not on file  Occupational History  . Occupation: Cook  Tobacco Use  . Smoking status: Current Every Day Smoker    Packs/day: 0.50    Years: 15.00    Pack years:  7.50    Types: Cigarettes  . Smokeless tobacco: Never Used  Substance and Sexual Activity  . Alcohol use: Yes    Comment: At least 12 ounces of homemade moon shine every other day.  . Drug use: Yes    Types: Marijuana    Comment: Smokes marijuana daily.  Used to smoke cocaine laced  marijuana cigarettes for 15-20 yrs but quit cocaine 6 yrs ago.  [name] Sexual activity: Yes    Birth control/protection: Other-see comments    Comment: BTL  Other Topics Concern  . Not on file  Social History Narrative   Lives in [place] with her fiance.  Does not routinely exercise.   Social Determinants of Health   Financial Resource Strain:   . Difficulty of Paying Living Expenses: Not on file  Food Insecurity:   . Worried About Charity fundraiser in the Last Year: Not on file  . Ran Out of Food in the Last Year: Not on file  Transportation Needs:   . Lack of Transportation (Medical): Not on file  . Lack of Transportation (Non-Medical): Not on file  Physical Activity:   . Days of Exercise per Week: Not on file  . Minutes of Exercise per Session: Not on file  Stress:   . Feeling of Stress : Not on file  Social Connections:   . Frequency of Communication with Friends and Family: Not on file  . Frequency of Social Gatherings with Friends and Family: Not on file  . Attends Religious Services: Not on file  . Active Member of Clubs or Organizations: Not on file  . Attends Archivist Meetings: Not on file  . Marital Status: Not on file     Family History: The patient's ***family history includes CAD in her mother; Cirrhosis in her father; Lupus in her sister. ROS:   Please see the history of present illness.    *** All other systems reviewed and are negative.   EKG:  EKG is *** ordered today.  The ekg ordered today demonstrates ***  Recent Labs: 03/19/2019: ALT 21; B Natriuretic Peptide 14.7; Hemoglobin 12.9; Magnesium 2.0; Platelets 287 06/29/2019: BUN 7; Creatinine, Ser 0.84; Potassium  4.1; Sodium 142   Recent Lipid Panel    Component Value Date/Time   CHOL 162 04/25/2019 1045   TRIG 133 04/25/2019 1045   HDL 52 04/25/2019 1045   CHOLHDL 3.1 04/25/2019 1045   CHOLHDL 4.0 Ratio 11/12/2007 2044   VLDL 32 11/12/2007 2044   LDLCALC 87 04/25/2019 1045    Physical Exam:    VS:  There were no vitals taken for this visit.    Wt Readings from Last 6 Encounters:  06/21/19 149 lb (67.6 kg)  04/25/19 143 lb 6.4 oz (65 kg)  03/19/19 153 lb (69.4 kg)  02/16/19 153 lb (69.4 kg)  11/06/18 157 lb (71.2 kg)  10/12/18 157 lb (71.2 kg)     Physical Exam***   ASSESSMENT:    1. Chronic systolic congestive heart failure ([place])  2. Essential (primary) hypertension   3. Aortic valve insufficiency, etiology of cardiac valve disease unspecified   4. Mitral valve insufficiency, unspecified etiology   5. Tobacco abuse    PLAN:    In order of problems listed above:  Chronic systolic and diastolic heart failure/nonischemic - history of LV dysfunction with EF 35% in 2017.  Right and left heart cath in 2017 showed patent coronary arteries and normal LV end-diastolic pressure.  Echocardiogram in 05/2017 showed EF returned to 55-60% with grade 2 DD. -Normal coronary arteries by cath in 2017. -Medical therapy includes carvedilol, ARB and spironolactone.  Hypertension -On amlodipine 5 mg, carvedilol 6.25 mg, losartan 50 mg, spironolactone 25 mg -  Aortic insufficiency -Moderate aortic regurgitation by TEE in 06/2017. -Continue to follow.  Mitral regurgitation -Mild by TEE in 06/2017  Tobacco abuse  Normal coronary arteries by cath in 2017     Medication Adjustments/Labs and Tests Ordered: Current medicines are reviewed at length with the patient today.  Concerns regarding medicines are outlined above. Labs and tests ordered and medication changes are outlined in the patient instructions below:  Patient Instructions  Lifestyle Modifications to Prevent and Treat Heart  Disease -Recommend heart healthy/Mediterranean diet, with whole grains, fruits, vegetables, fish, lean meats, nuts, olive oil and avocado oil.  -Limit salt intake to less than 2000 mg per day.  -Recommend moderate walking, starting slowly with a few minutes and working up to 3-5 times/week for 30-50 minutes each session. Aim for at least 150 minutes.week. Goal should be pace of 3 miles/hours, or walking 1.5 miles in 30 minutes -Recommend avoidance of tobacco products. Avoid excess alcohol. -Keep blood pressure well controlled, ideally less than 130/80.      Signed, Daune Perch, NP  07/21/2019 9:38 AM    [place]

## 2019-07-22 ENCOUNTER — Ambulatory Visit: Payer: Medicaid Other | Admitting: Cardiology

## 2019-07-22 NOTE — Progress Notes (Deleted)
Cardiology Office Note:    Date:  07/22/2019   ID:  Rachael Stone, DOB 1965-09-04, MRN [id number]  PCP:  Kerin Perna, NP  Cardiologist:  Mertie Moores, MD  Referring MD: Kerin Perna, NP   No chief complaint on file. ***  History of Present Illness:    Rachael Stone is a 54 y.o. female with a past medical history significant for nonischemic cardiomyopathy, mild-moderate aortic insufficiency, mild mitral regurgitation, tobacco abuse.  She also has a history of alcohol and marijuana use, remote cocaine use.  The patient has a history of LV dysfunction with EF 35% in 2017.  Right and left heart cath in 2017 showed patent coronary arteries and normal LV end-diastolic pressure.  Echocardiogram in 05/2017 showed EF returned to 55-60% with grade 2 diastolic dysfunction, moderate to severe aortic regurgitation and mild MR.  TEE was done on 06/26/2017 that showed EF 55-60%, moderate aortic regurgitation and mild MR.      Cardiac studies   TEE 06/26/2017 Study Conclusions - Left ventricle: Systolic function was normal. The estimated ejection fraction was in the range of 55% to 60%. - Aortic valve: There was moderate regurgitation. - Mitral valve: There was mild regurgitation. - Left atrium: No evidence of thrombus in the atrial cavity or appendage. - Atrial septum: No defect or patent foramen ovale was identified. - Tricuspid valve: No evidence of vegetation.  Echocardiogram 06/09/2017: EF 55-60% with grade 2 diastolic dysfunction, mild MR, moderate-severe aortic regurgitation  Right/Left Heart Cath and Coronary Angiography 04/10/2016  Conclusion    LV end diastolic pressure is normal.  There is moderate left ventricular systolic dysfunction.  The left ventricular ejection fraction is 35% by visual estimate.  There is no aortic valve stenosis.  LV end diastolic pressure is normal.  Nonischemic cardiomyopathy. Continue medical therapy.    Echocardiogram 04/08/2016: EF 35%, moderate-severe MR, moderate aortic regurgitation   Past Medical History:  Diagnosis Date  . Back pain   . Cardiomyopathy ([place])    a. 04/08/2016 Echo: EF 35%, diff HK, mild LVH, Gr2 DD, mod AI, mod to sev MR, mildly dil LA.  [name] DEPRESSIVE DISORDER NOT ELSEWHERE CLASSIFIED 12/14/2008   Qualifier: Diagnosis of  By: [name], Junie Panning    . DYSFUNCTIONAL UTERINE BLEEDING 11/12/2007   Qualifier: Diagnosis of  By: [name], Junie Panning    . ETOH abuse   . Hypertensive heart disease with heart failure ([place])   . Marijuana abuse   . MENORRHAGIA 12/14/2008   Qualifier: Diagnosis of  By: [name], Junie Panning    . Moderate aortic insufficiency    a. 04/08/2016 Echo: mod AI.  [name] Moderate to Severe Mitral Regurgitation    a. 04/08/2016 Echo: mod-sev MR directed centrally.  . Tobacco abuse     Past Surgical History:  Procedure Laterality Date  . CARDIAC CATHETERIZATION N/A 04/10/2016   Procedure: Right/Left Heart Cath and Coronary Angiography;  Surgeon: [name], MD;  Location: [facility];  Service: Cardiovascular;  Laterality: N/A;  . EXPLORATORY LAPAROTOMY  2003   Ectopic Pregnancy - unsure which side or if tube was removed  . TEE WITHOUT CARDIOVERSION N/A 06/26/2017   Procedure: TRANSESOPHAGEAL ECHOCARDIOGRAM (TEE);  Surgeon: [name], MD;  Location[facility] END[facility]COPY;  Service: Cardiovascular;  Laterality: N/A;  . TUBAL LIGATION      Current Medications: No outpatient medications have been marked as taking for the 07/22/19 encounter (Appointment) with Daune Perch, NP.     Allergies:  Ibuprofen   Social History   Socioeconomic History  . Marital status: Single    Spouse name: Not on file  . Number of children: Not on file  . Years of education: Not on file  . Highest education level: Not on file  Occupational History  . Occupation: Cook  Tobacco Use  . Smoking status: Current Every Day Smoker    Packs/day: 0.50    Years: 15.00    Pack years:  7.50    Types: Cigarettes  . Smokeless tobacco: Never Used  Substance and Sexual Activity  . Alcohol use: Yes    Comment: At least 12 ounces of homemade moon shine every other day.  . Drug use: Yes    Types: Marijuana    Comment: Smokes marijuana daily.  Used to smoke cocaine laced  marijuana cigarettes for 15-20 yrs but quit cocaine 6 yrs ago.  [name] Sexual activity: Yes    Birth control/protection: Other-see comments    Comment: BTL  Other Topics Concern  . Not on file  Social History Narrative   Lives in [place] with her fiance.  Does not routinely exercise.   Social Determinants of Health   Financial Resource Strain:   . Difficulty of Paying Living Expenses: Not on file  Food Insecurity:   . Worried About Charity fundraiser in the Last Year: Not on file  . Ran Out of Food in the Last Year: Not on file  Transportation Needs:   . Lack of Transportation (Medical): Not on file  . Lack of Transportation (Non-Medical): Not on file  Physical Activity:   . Days of Exercise per Week: Not on file  . Minutes of Exercise per Session: Not on file  Stress:   . Feeling of Stress : Not on file  Social Connections:   . Frequency of Communication with Friends and Family: Not on file  . Frequency of Social Gatherings with Friends and Family: Not on file  . Attends Religious Services: Not on file  . Active Member of Clubs or Organizations: Not on file  . Attends Archivist Meetings: Not on file  . Marital Status: Not on file     Family History: The patient's family history includes CAD in her mother; Cirrhosis in her father; Lupus in her sister. ROS:   Please see the history of present illness.     All other systems reviewed and are negative.   EKG:  EKG is *** ordered today.  The ekg ordered today demonstrates ***  Recent Labs: 03/19/2019: ALT 21; B Natriuretic Peptide 14.7; Hemoglobin 12.9; Magnesium 2.0; Platelets 287 06/29/2019: BUN 7; Creatinine, Ser 0.84; Potassium 4.1;  Sodium 142   Recent Lipid Panel    Component Value Date/Time   CHOL 162 04/25/2019 1045   TRIG 133 04/25/2019 1045   HDL 52 04/25/2019 1045   CHOLHDL 3.1 04/25/2019 1045   CHOLHDL 4.0 Ratio 11/12/2007 2044   VLDL 32 11/12/2007 2044   LDLCALC 87 04/25/2019 1045    Physical Exam:    VS:  There were no vitals taken for this visit.    Wt Readings from Last 6 Encounters:  06/21/19 149 lb (67.6 kg)  04/25/19 143 lb 6.4 oz (65 kg)  03/19/19 153 lb (69.4 kg)  02/16/19 153 lb (69.4 kg)  11/06/18 157 lb (71.2 kg)  10/12/18 157 lb (71.2 kg)     Physical Exam***   ASSESSMENT:    1. Chronic combined systolic and diastolic CHF (congestive heart failure) (  [place])   2. Essential (primary) hypertension   3. Aortic valve insufficiency, etiology of cardiac valve disease unspecified   4. Mitral valve insufficiency, unspecified etiology   5. TOBACCO DEPENDENCE    PLAN:    In order of problems listed above:   Chronic systolic and diastolic heart failure/nonischemic - history of LV dysfunction with EF 35% in 2017.  Right and left heart cath in 2017 showed patent coronary arteries and normal LV end-diastolic pressure.  Echocardiogram in 05/2017 showed EF returned to 55-60% with grade 2 DD. -Normal coronary arteries by cath in 2017. -Medical therapy includes carvedilol, ARB and spironolactone.  Hypertension -On amlodipine 5 mg, carvedilol 6.25 mg, losartan 50 mg, spironolactone 25 mg -  Aortic insufficiency -Moderate aortic regurgitation by TEE in 06/2017. -Continue to follow.  Mitral regurgitation -Mild by TEE in 06/2017  Tobacco abuse  Normal coronary arteries by cath in 2017      Medication Adjustments/Labs and Tests Ordered: Current medicines are reviewed at length with the patient today.  Concerns regarding medicines are outlined above. Labs and tests ordered and medication changes are outlined in the patient instructions below:  There are no Patient Instructions  on file for this visit.   Signed, Daune Perch, NP  07/22/2019 8:12 AM    [place]

## 2019-07-27 ENCOUNTER — Ambulatory Visit (INDEPENDENT_AMBULATORY_CARE_PROVIDER_SITE_OTHER): Payer: Medicaid Other | Admitting: Primary Care

## 2019-07-27 ENCOUNTER — Encounter (INDEPENDENT_AMBULATORY_CARE_PROVIDER_SITE_OTHER): Payer: Self-pay | Admitting: Primary Care

## 2019-07-27 ENCOUNTER — Ambulatory Visit (INDEPENDENT_AMBULATORY_CARE_PROVIDER_SITE_OTHER): Payer: Self-pay | Admitting: Primary Care

## 2019-07-27 ENCOUNTER — Other Ambulatory Visit (HOSPITAL_COMMUNITY)
Admission: RE | Admit: 2019-07-27 | Discharge: 2019-07-27 | Disposition: A | Discharge status: Home / Self Care | Payer: Medicaid Other | Source: Ambulatory Visit | Attending: Primary Care | Admitting: Primary Care

## 2019-07-27 ENCOUNTER — Other Ambulatory Visit: Payer: Self-pay

## 2019-07-27 VITALS — BP 133/78 | HR 90 | Temp 97.2°F | Ht 65.0 in | Wt 148.2 lb

## 2019-07-27 DIAGNOSIS — N898 Other specified noninflammatory disorders of vagina: Secondary | ICD-10-CM | POA: Insufficient documentation

## 2019-07-27 DIAGNOSIS — N764 Abscess of vulva: Secondary | ICD-10-CM

## 2019-07-27 DIAGNOSIS — Z1231 Encounter for screening mammogram for malignant neoplasm of breast: Secondary | ICD-10-CM

## 2019-07-27 NOTE — Progress Notes (Signed)
Established Patient Office Visit  Subjective:  Patient ID: Rachael Stone, female    DOB: 1966-05-09  Age: 54 y.o. MRN: [id number]  CC:  Chief Complaint  Patient presents with  . Blood Pressure Check  . Vaginal Discharge    HPI [name] presents for a pap but not due per guidelines. She is complaining of feeling uncomfortable in her labia and vaginal discharged. Blood pressure has been stable and followed by cardiology which will managing her hypertension.  Denies shortness of breath, headaches, chest pain or lower extremity edema  Past Medical History:  Diagnosis Date  . Back pain   . Cardiomyopathy ([place])    a. 04/08/2016 Echo: EF 35%, diff HK, mild LVH, Gr2 DD, mod AI, mod to sev MR, mildly dil LA.  [name] DEPRESSIVE DISORDER NOT ELSEWHERE CLASSIFIED 12/14/2008   Qualifier: Diagnosis of  By: [name], Junie Panning    . DYSFUNCTIONAL UTERINE BLEEDING 11/12/2007   Qualifier: Diagnosis of  By: [name], Junie Panning    . ETOH abuse   . Hypertensive heart disease with heart failure ([place])   . Marijuana abuse   . MENORRHAGIA 12/14/2008   Qualifier: Diagnosis of  By: [name], Junie Panning    . Moderate aortic insufficiency    a. 04/08/2016 Echo: mod AI.  [name] Moderate to Severe Mitral Regurgitation    a. 04/08/2016 Echo: mod-sev MR directed centrally.  . Tobacco abuse     Past Surgical History:  Procedure Laterality Date  . CARDIAC CATHETERIZATION N/A 04/10/2016   Procedure: Right/Left Heart Cath and Coronary Angiography;  Surgeon: [name], MD;  Location: [facility];  Service: Cardiovascular;  Laterality: N/A;  . EXPLORATORY LAPAROTOMY  2003   Ectopic Pregnancy - unsure which side or if tube was removed  . TEE WITHOUT CARDIOVERSION N/A 06/26/2017   Procedure: TRANSESOPHAGEAL ECHOCARDIOGRAM (TEE);  Surgeon: [name], MD;  Location: [facility] ENDOSCOPY;  Service: Cardiovascular;  Laterality: N/A;  . TUBAL LIGATION      Family History  Problem Relation Age of Onset  . CAD Mother         First MI @ 76 - 86 total.  . Cirrhosis Father        alcoholic - died in his 85'Y.  . Lupus Sister     Social History   Socioeconomic History  . Marital status: Single    Spouse name: Not on file  . Number of children: Not on file  . Years of education: Not on file  . Highest education level: Not on file  Occupational History  . Occupation: Cook  Tobacco Use  . Smoking status: Current Every Day Smoker    Packs/day: 0.50    Years: 15.00    Pack years: 7.50    Types: Cigarettes  . Smokeless tobacco: Never Used  Substance and Sexual Activity  . Alcohol use: Yes    Comment: At least 12 ounces of homemade moon shine every other day.  . Drug use: Yes    Types: Marijuana    Comment: Smokes marijuana daily.  Used to smoke cocaine laced  marijuana cigarettes for 15-20 yrs but quit cocaine 6 yrs ago.  [name] Sexual activity: Yes    Birth control/protection: Other-see comments    Comment: BTL  Other Topics Concern  . Not on file  Social History Narrative   Lives in [place] with her fiance.  Does not routinely exercise.   Social Determinants of Health   Financial Resource Strain:   .  Difficulty of Paying Living Expenses: Not on file  Food Insecurity:   . Worried About Charity fundraiser in the Last Year: Not on file  . Ran Out of Food in the Last Year: Not on file  Transportation Needs:   . Lack of Transportation (Medical): Not on file  . Lack of Transportation (Non-Medical): Not on file  Physical Activity:   . Days of Exercise per Week: Not on file  . Minutes of Exercise per Session: Not on file  Stress:   . Feeling of Stress : Not on file  Social Connections:   . Frequency of Communication with Friends and Family: Not on file  . Frequency of Social Gatherings with Friends and Family: Not on file  . Attends Religious Services: Not on file  . Active Member of Clubs or Organizations: Not on file  . Attends Archivist Meetings: Not on file  . Marital Status: Not on  file  Intimate Partner Violence:   . Fear of Current or Ex-Partner: Not on file  . Emotionally Abused: Not on file  . Physically Abused: Not on file  . Sexually Abused: Not on file    Outpatient Medications Prior to Visit  Medication Sig Dispense Refill  . amLODipine (NORVASC) 5 MG tablet Take 1 tablet (5 mg total) by mouth daily. 90 tablet 3  . aspirin 81 MG chewable tablet Chew 81 mg by mouth daily.    [name] atorvastatin (LIPITOR) 40 MG tablet Take 1 tablet (40 mg total) by mouth daily. 30 tablet 3  . carvedilol (COREG) 6.25 MG tablet Take 1 tablet (6.25 mg total) by mouth 2 (two) times daily. 60 tablet 3  . losartan (COZAAR) 50 MG tablet Take 1 tablet (50 mg total) by mouth daily. 90 tablet 3  . potassium chloride SA (KLOR-CON) 20 MEQ tablet Take 1 tablet (20 mEq total) by mouth daily. 90 tablet 3  . spironolactone (ALDACTONE) 25 MG tablet Take 1 tablet (25 mg total) by mouth daily. 90 tablet 3   No facility-administered medications prior to visit.    Allergies  Allergen Reactions  . Ibuprofen Other (See Comments)    Irritates stomach    ROS Review of Systems  Genitourinary: Positive for vaginal discharge and vaginal pain.  Skin:       Labia majora several bumps present   All other systems reviewed and are negative.     Objective:    Physical Exam  Constitutional: She is oriented to person, place, and time. She appears well-developed and well-nourished.  HENT:  Head: Normocephalic.  Cardiovascular: Normal rate and regular rhythm.  Pulmonary/Chest: Effort normal and breath sounds normal.  Abdominal: Bowel sounds are normal.  Musculoskeletal:        General: Normal range of motion.     Cervical back: Neck supple.  Neurological: She is oriented to person, place, and time.  Skin: Skin is warm and dry.  Psychiatric: She has a normal mood and affect. Her behavior is normal. Judgment and thought content normal.    BP 133/78 (BP Location: Left Arm, Patient Position:  Sitting, Cuff Size: Normal)   Pulse 90   Temp (!) 97.2 F (36.2 C) (Temporal)   Ht 5\' 5"  (1.651 m)   Wt 148 lb 3.2 oz (67.2 kg)   SpO2 95%   BMI 24.66 kg/m  Wt Readings from Last 3 Encounters:  07/27/19 148 lb 3.2 oz (67.2 kg)  06/21/19 149 lb (67.6 kg)  04/25/19 143 lb 6.4 oz (  65 kg)     Health Maintenance Due  Topic Date Due  . MAMMOGRAM  02/02/2018  . COLONOSCOPY  02/02/2018  . INFLUENZA VACCINE  02/12/2019    There are no preventive care reminders to display for this patient.  Lab Results  Component Value Date   TSH 0.717 01/22/2017   Lab Results  Component Value Date   WBC 8.2 03/19/2019   HGB 12.9 03/19/2019   HCT 38.3 03/19/2019   MCV 86.3 03/19/2019   PLT 287 03/19/2019   Lab Results  Component Value Date   NA 142 06/29/2019   K 4.1 06/29/2019   CO2 22 06/29/2019   GLUCOSE 86 06/29/2019   BUN 7 06/29/2019   CREATININE 0.84 06/29/2019   BILITOT 0.3 03/19/2019   ALKPHOS 54 03/19/2019   AST 25 03/19/2019   ALT 21 03/19/2019   PROT 7.8 03/19/2019   ALBUMIN 4.0 03/19/2019   CALCIUM 10.0 06/29/2019   ANIONGAP 13 03/19/2019   Lab Results  Component Value Date   CHOL 162 04/25/2019   Lab Results  Component Value Date   HDL 52 04/25/2019   Lab Results  Component Value Date   LDLCALC 87 04/25/2019   Lab Results  Component Value Date   TRIG 133 04/25/2019   Lab Results  Component Value Date   CHOLHDL 3.1 04/25/2019   Lab Results  Component Value Date   HGBA1C 5.3 01/10/2019      Assessment & Plan:  Lanaya was seen today for blood pressure check and vaginal discharge.  Diagnoses and all orders for this visit:  Vaginal discharge -     Odor and white thin discharge  -     Cervicovaginal ancillary only  Abscess of genital labia Left side several firm nodules no drainage present rule out HSV -     HSV(herpes simplex vrs) 1+2 ab-IgG -     HIV antibody (with reflex)  Encounter for screening mammogram for malignant neoplasm of  breast Previous visit she filled out the form for [facility] and no correspondence for mammogram. Awaiting approval  No orders of the defined types were placed in this encounter.   Follow-up: Return in about 3 months (around 10/25/2019) for tele visit Bp check.    Kerin Perna, NP

## 2019-07-27 NOTE — Patient Instructions (Signed)
Menopause Menopause is the normal time of life when menstrual periods stop completely. It is usually confirmed by 12 months without a menstrual period. The transition to menopause (perimenopause) most often happens between the ages of 45 and 55. During perimenopause, hormone levels change in your body, which can cause symptoms and affect your health. Menopause may increase your risk for:  Loss of bone (osteoporosis), which causes bone breaks (fractures).  Depression.  Hardening and narrowing of the arteries (atherosclerosis), which can cause heart attacks and strokes. What are the causes? This condition is usually caused by a natural change in hormone levels that happens as you get older. The condition may also be caused by surgery to remove both ovaries (bilateral oophorectomy). What increases the risk? This condition is more likely to start at an earlier age if you have certain medical conditions or treatments, including:  A tumor of the pituitary gland in the brain.  A disease that affects the ovaries and hormone production.  Radiation treatment for cancer.  Certain cancer treatments, such as chemotherapy or hormone (anti-estrogen) therapy.  Heavy smoking and excessive alcohol use.  Family history of early menopause. This condition is also more likely to develop earlier in women who are very thin. What are the signs or symptoms? Symptoms of this condition include:  Hot flashes.  Irregular menstrual periods.  Night sweats.  Changes in feelings about sex. This could be a decrease in sex drive or an increased comfort around your sexuality.  Vaginal dryness and thinning of the vaginal walls. This may cause painful intercourse.  Dryness of the skin and development of wrinkles.  Headaches.  Problems sleeping (insomnia).  Mood swings or irritability.  Memory problems.  Weight gain.  Hair growth on the face and chest.  Bladder infections or problems with urinating. How  is this diagnosed? This condition is diagnosed based on your medical history, a physical exam, your age, your menstrual history, and your symptoms. Hormone tests may also be done. How is this treated? In some cases, no treatment is needed. You and your health care provider should make a decision together about whether treatment is necessary. Treatment will be based on your individual condition and preferences. Treatment for this condition focuses on managing symptoms. Treatment may include:  Menopausal hormone therapy (MHT).  Medicines to treat specific symptoms or complications.  Acupuncture.  Vitamin or herbal supplements. Before starting treatment, make sure to let your health care provider know if you have a personal or family history of:  Heart disease.  Breast cancer.  Blood clots.  Diabetes.  Osteoporosis. Follow these instructions at home: Lifestyle  Do not use any products that contain nicotine or tobacco, such as cigarettes and e-cigarettes. If you need help quitting, ask your health care provider.  Get at least 30 minutes of physical activity on 5 or more days each week.  Avoid alcoholic and caffeinated beverages, as well as spicy foods. This may help prevent hot flashes.  Get 7-8 hours of sleep each night.  If you have hot flashes, try: ? Dressing in layers. ? Avoiding things that may trigger hot flashes, such as spicy food, warm places, or stress. ? Taking slow, deep breaths when a hot flash starts. ? Keeping a fan in your home and office.  Find ways to manage stress, such as deep breathing, meditation, or journaling.  Consider going to group therapy with other women who are having menopause symptoms. Ask your health care provider about recommended group therapy meetings. Eating and   drinking  Eat a healthy, balanced diet that contains whole grains, lean protein, low-fat dairy, and plenty of fruits and vegetables.  Your health care provider may recommend  adding more soy to your diet. Foods that contain soy include tofu, tempeh, and soy milk.  Eat plenty of foods that contain calcium and vitamin D for bone health. Items that are rich in calcium include low-fat milk, yogurt, beans, almonds, sardines, broccoli, and kale. Medicines  Take over-the-counter and prescription medicines only as told by your health care provider.  Talk with your health care provider before starting any herbal supplements. If prescribed, take vitamins and supplements as told by your health care provider. These may include: ? Calcium. Women age 51 and older should get 1,200 mg (milligrams) of calcium every day. ? Vitamin D. Women need 600-800 International Units of vitamin D each day. ? Vitamins B12 and B6. Aim for 50 micrograms of B12 and 1.5 mg of B6 each day. General instructions  Keep track of your menstrual periods, including: ? When they occur. ? How heavy they are and how long they last. ? How much time passes between periods.  Keep track of your symptoms, noting when they start, how often you have them, and how long they last.  Use vaginal lubricants or moisturizers to help with vaginal dryness and improve comfort during sex.  Keep all follow-up visits as told by your health care provider. This is important. This includes any group therapy or counseling. Contact a health care provider if:  You are still having menstrual periods after age 55.  You have pain during sex.  You have not had a period for 12 months and you develop vaginal bleeding. Get help right away if:  You have: ? Severe depression. ? Excessive vaginal bleeding. ? Pain when you urinate. ? A fast or irregular heart beat (palpitations). ? Severe headaches. ? Abdomen (abdominal) pain or severe indigestion.  You fell and you think you have a broken bone.  You develop leg or chest pain.  You develop vision problems.  You feel a lump in your breast. Summary  Menopause is the normal  time of life when menstrual periods stop completely. It is usually confirmed by 12 months without a menstrual period.  The transition to menopause (perimenopause) most often happens between the ages of 45 and 55.  Symptoms can be managed through medicines, lifestyle changes, and complementary therapies such as acupuncture.  Eat a balanced diet that is rich in nutrients to promote bone health and heart health and to manage symptoms during menopause. This information is not intended to replace advice given to you by your health care provider. Make sure you discuss any questions you have with your health care provider. Document Revised: 06/12/2017 Document Reviewed: 08/02/2016 Elsevier Patient Education  2020 [employer]  

## 2019-07-28 LAB — CERVICOVAGINAL ANCILLARY ONLY
Bacterial Vaginitis (gardnerella): POSITIVE — AB
Candida Glabrata: NEGATIVE
Candida Vaginitis: NEGATIVE
Chlamydia: NEGATIVE
Comment: NEGATIVE
Comment: NEGATIVE
Comment: NEGATIVE
Comment: NEGATIVE
Comment: NEGATIVE
Comment: NORMAL
Neisseria Gonorrhea: NEGATIVE
Trichomonas: POSITIVE — AB

## 2019-07-28 LAB — HIV ANTIBODY (ROUTINE TESTING W REFLEX): HIV Screen 4th Generation wRfx: NONREACTIVE

## 2019-07-29 ENCOUNTER — Other Ambulatory Visit: Payer: Self-pay

## 2019-07-29 ENCOUNTER — Ambulatory Visit (INDEPENDENT_AMBULATORY_CARE_PROVIDER_SITE_OTHER): Payer: Medicaid Other | Admitting: Primary Care

## 2019-07-29 DIAGNOSIS — A599 Trichomoniasis, unspecified: Secondary | ICD-10-CM

## 2019-07-29 LAB — HSV(HERPES SIMPLEX VRS) I + II AB-IGG
HSV 1 Glycoprotein G Ab, IgG: <0.91 index (ref 0.00–0.90)
HSV 2 IgG, Type Spec: >23.6 index — ABNORMAL HIGH (ref 0.00–0.90)

## 2019-07-29 MED ORDER — METRONIDAZOLE 500 MG PO TABS
2000.0000 mg | ORAL_TABLET | Freq: Once | ORAL | Status: AC
  Administered 2019-07-29: 2000 mg via ORAL

## 2019-07-29 NOTE — Progress Notes (Signed)
Patient arrived at clinic to receive treatment for STD.  Patient was given 2,000 mg of Flagyl and tolerated well.

## 2019-09-08 MED FILL — ?SPIRONOLACTONE 25 MG TABLE: 25 | 30 days supply | Qty: 30 | Fill #1

## 2019-09-08 MED FILL — AMLODIPINE BESYLATE 5 MG TA: 5 | 30 days supply | Qty: 30 | Fill #1

## 2019-09-08 MED FILL — ?ATORVASTATIN 40MG TABLET: 40 | 30 days supply | Qty: 30 | Fill #3

## 2019-09-08 MED FILL — POTASSIUM CL ER 20 MEQ TABL: 20 | 30 days supply | Qty: 30 | Fill #1

## 2019-09-08 MED FILL — LOSARTAN POTASSIUM 50 MG TA: 50 | 30 days supply | Qty: 30 | Fill #1

## 2019-10-13 ENCOUNTER — Emergency Department (HOSPITAL_COMMUNITY): Payer: Self-pay

## 2019-10-13 ENCOUNTER — Encounter (HOSPITAL_COMMUNITY): Payer: Self-pay | Admitting: Emergency Medicine

## 2019-10-13 ENCOUNTER — Other Ambulatory Visit: Payer: Self-pay

## 2019-10-13 ENCOUNTER — Emergency Department (HOSPITAL_COMMUNITY)
Admission: EM | Admit: 2019-10-13 | Discharge: 2019-10-13 | Disposition: A | Discharge status: Home / Self Care | Payer: Self-pay | Attending: Emergency Medicine | Admitting: Emergency Medicine

## 2019-10-13 DIAGNOSIS — R202 Paresthesia of skin: Secondary | ICD-10-CM

## 2019-10-13 DIAGNOSIS — R002 Palpitations: Secondary | ICD-10-CM | POA: Insufficient documentation

## 2019-10-13 DIAGNOSIS — M5020 Other cervical disc displacement, unspecified cervical region: Secondary | ICD-10-CM | POA: Insufficient documentation

## 2019-10-13 DIAGNOSIS — F17210 Nicotine dependence, cigarettes, uncomplicated: Secondary | ICD-10-CM | POA: Insufficient documentation

## 2019-10-13 DIAGNOSIS — I5042 Chronic combined systolic (congestive) and diastolic (congestive) heart failure: Secondary | ICD-10-CM | POA: Insufficient documentation

## 2019-10-13 DIAGNOSIS — R0602 Shortness of breath: Secondary | ICD-10-CM | POA: Insufficient documentation

## 2019-10-13 DIAGNOSIS — I110 Hypertensive heart disease with heart failure: Secondary | ICD-10-CM | POA: Insufficient documentation

## 2019-10-13 DIAGNOSIS — Z7982 Long term (current) use of aspirin: Secondary | ICD-10-CM | POA: Insufficient documentation

## 2019-10-13 DIAGNOSIS — Z79899 Other long term (current) drug therapy: Secondary | ICD-10-CM | POA: Insufficient documentation

## 2019-10-13 LAB — CBC
HCT: 40.1 % (ref 36.0–46.0)
Hemoglobin: 13.1 g/dL (ref 12.0–15.0)
MCH: 30 pg (ref 26.0–34.0)
MCHC: 32.7 g/dL (ref 30.0–36.0)
MCV: 92 fL (ref 80.0–100.0)
Platelets: 194 10*3/uL (ref 150–400)
RBC: 4.36 MIL/uL (ref 3.87–5.11)
RDW: 15.6 % — ABNORMAL HIGH (ref 11.5–15.5)
WBC: 6 10*3/uL (ref 4.0–10.5)
nRBC: 0 % (ref 0.0–0.2)

## 2019-10-13 LAB — BASIC METABOLIC PANEL
Anion gap: 12 (ref 5–15)
BUN: 9 mg/dL (ref 6–20)
CO2: 22 mmol/L (ref 22–32)
Calcium: 9.3 mg/dL (ref 8.9–10.3)
Chloride: 103 mmol/L (ref 98–111)
Creatinine, Ser: 0.87 mg/dL (ref 0.44–1.00)
GFR calc Af Amer: >60 mL/min (ref >60)
GFR calc non Af Amer: >60 mL/min (ref >60)
Glucose, Bld: 92 mg/dL (ref 70–99)
Potassium: 4 mmol/L (ref 3.5–5.1)
Sodium: 137 mmol/L (ref 135–145)

## 2019-10-13 LAB — TROPONIN I (HIGH SENSITIVITY): Troponin I (High Sensitivity): 4 ng/L

## 2019-10-13 LAB — BRAIN NATRIURETIC PEPTIDE: B Natriuretic Peptide: 11.7 pg/mL (ref 0.0–100.0)

## 2019-10-13 MED ORDER — LORAZEPAM 2 MG/ML IJ SOLN: 1.0000 mg | Freq: Once | INTRAMUSCULAR | Status: DC

## 2019-10-13 MED ORDER — SODIUM CHLORIDE 0.9% FLUSH: 3.0000 mL | Freq: Once | INTRAVENOUS | Status: DC

## 2019-10-13 MED ORDER — PREDNISONE 20 MG PO TABS
60.0000 mg | ORAL_TABLET | Freq: Once | ORAL | Status: AC
  Administered 2019-10-13: 16:00:00 60 mg via ORAL
  Filled 2019-10-13: qty 3

## 2019-10-13 MED ORDER — IOHEXOL 350 MG/ML SOLN
100.0000 mL | Freq: Once | INTRAVENOUS | Status: AC | PRN
  Administered 2019-10-13: 100 mL via INTRAVENOUS

## 2019-10-13 MED ORDER — GADOBUTROL 1 MMOL/ML IV SOLN
7.0000 mL | Freq: Once | INTRAVENOUS | Status: AC | PRN
  Administered 2019-10-13: 7 mL via INTRAVENOUS

## 2019-10-13 MED ORDER — ALBUTEROL SULFATE HFA 108 (90 BASE) MCG/ACT IN AERS
2.0000 | INHALATION_SPRAY | Freq: Once | RESPIRATORY_TRACT | Status: AC
  Administered 2019-10-13: 16:00:00 2 via RESPIRATORY_TRACT
  Filled 2019-10-13: qty 6.7

## 2019-10-13 NOTE — Discharge Instructions (Addendum)
Return for new or worsening symptoms.  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

## 2019-10-13 NOTE — ED Provider Notes (Signed)
[facility] EMERGENCY DEPARTMENT Provider Note   CSN: [id number] Arrival date & time: 10/13/19  1142    History Chief Complaint  Patient presents with   Numbness   Shortness of Breath   Rachael Stone is a 54 y.o. female with medical history significant for cardiomyopathy, last EF 55-60, mitral regurgitation, alcohol abuse, marijuana abuse, aortic insufficiency who presents for evaluation of multiple complaints.  Patient states she has had intermittent shortness of breath over the last week.  Not necessarily exertional or pleuritic in nature.  No associated chest pain, nausea, vomiting, diaphoresis, radiation to arm or jaw.  No unilateral leg swelling, redness, warmth.  No hemoptysis, recent surgery or immobilization.  Denies any PND, orthopnea.  No bilateral lower extremity edema.  Admits to a chronic nonproductive cough.  She does admit to using tobacco.  Patient also with intermittent paresthesia in her left arm x1 week.  Has occurred 3 times.  No associated facial droop, headache, lightheadedness, dizziness, weakness, difficutly with word finding, drooling or dysphagia.  States will occur for approximately 1 to 2 minutes and then self resolved.  Patient states "feels like my arm falls asleep."  Last episode 3 days ago.  Patient states "I just want to get it checked out."  No recent injuries or falls.  No bilateral symptoms.  No symptoms to lower extremities.  Denies additional aggravating or alleviating factors.  She is tolerating p.o. intake without difficulty, in fact she just finished a sandwich.   Patient's paresthesias and shortness of breath do not occur simultaneously.  History obtained from patient and past medical record.  No interpreter is used.  HPI     Past Medical History:  Diagnosis Date   Back pain    Cardiomyopathy ([place])    a. 04/08/2016 Echo: EF 35%, diff HK, mild LVH, Gr2 DD, mod AI, mod to sev MR, mildly dil LA.   DEPRESSIVE DISORDER NOT  ELSEWHERE CLASSIFIED 12/14/2008   Qualifier: Diagnosis of  By: [name], [name]     DYSFUNCTIONAL UTERINE BLEEDING 11/12/2007   Qualifier: Diagnosis of  By: [name], [name]     ETOH abuse    Hypertensive heart disease with heart failure ([place])    Marijuana abuse    MENORRHAGIA 12/14/2008   Qualifier: Diagnosis of  By: [name], [name]     Moderate aortic insufficiency    a. 04/08/2016 Echo: mod AI.   Moderate to Severe Mitral Regurgitation    a. 04/08/2016 Echo: mod-sev MR directed centrally.   Tobacco abuse     Patient Active Problem List   Diagnosis Date Noted   Alcoholism [facility])    Hypokalemia    Hospital discharge follow-up    BV (bacterial vaginosis) 01/26/2017   Trichomonal infection 01/26/2017   Essential hypertension 11/14/2016   Normal coronary arteries 04/11/2016   Hypertensive heart disease with heart failure (HCC)    NICM (nonischemic cardiomyopathy) (HCC)    Nonrheumatic aortic valve insufficiency    Moderate to Severe Mitral Regurgitation    Tobacco abuse    Acute pulmonary edema ([place]) 04/08/2016   Elevated blood pressure 04/08/2016   Dyspnea    Acute combined systolic and diastolic heart failure ([place]) 04/07/2016   VAGINAL DISCHARGE 09/10/2009   CHEST PAIN UNSPECIFIED 08/13/2009   DEPRESSIVE DISORDER NOT ELSEWHERE CLASSIFIED 12/14/2008   MENORRHAGIA 12/14/2008   NAUSEA, CHRONIC 12/14/2008   ELEVATED BLOOD PRESSURE WITHOUT DIAGNOSIS OF HYPERTENSION 12/14/2008   DYSFUNCTIONAL UTERINE BLEEDING 11/12/2007   TOBACCO DEPENDENCE 09/10/2006  GASTROESOPHAGEAL REFLUX, NO ESOPHAGITIS 09/10/2006    Past Surgical History:  Procedure Laterality Date   CARDIAC CATHETERIZATION N/A 04/10/2016   Procedure: Right/Left Heart Cath and Coronary Angiography;  Surgeon: [name], MD;  Location: [facility];  Service: Cardiovascular;  Laterality: N/A;   EXPLORATORY LAPAROTOMY  2003   Ectopic Pregnancy - unsure which side or if tube was  removed   TEE WITHOUT CARDIOVERSION N/A 06/26/2017   Procedure: TRANSESOPHAGEAL ECHOCARDIOGRAM (TEE);  Surgeon: Thayer Headings, MD;  Location[facility] ENDOSCOPY;  Service: Cardiovascular;  Laterality: N/A;   TUBAL LIGATION       OB History    Gravida  3   Para  2   Term  0   Preterm  0   AB  1   Living  2     SAB  0   TAB  0   Ectopic  1   Multiple  0   Live Births              Family History  Problem Relation Age of Onset   CAD Mother        First MI @ 36 - 10 total.   Cirrhosis Father        alcoholic - died in his 40'J.   Lupus Sister     Social History   Tobacco Use   Smoking status: Current Every Day Smoker    Packs/day: 0.50    Years: 15.00    Pack years: 7.50    Types: Cigarettes   Smokeless tobacco: Never Used  Substance Use Topics   Alcohol use: Yes    Comment: At least 12 ounces of homemade moon shine every other day.   Drug use: Yes    Types: Marijuana    Comment: Smokes marijuana daily.  Used to smoke cocaine laced  marijuana cigarettes for 15-20 yrs but quit cocaine 6 yrs ago.    Home Medications Prior to Admission medications   Medication Sig Start Date End Date Taking? Authorizing Provider  amLODipine (NORVASC) 5 MG tablet Take 1 tablet (5 mg total) by mouth daily. 06/21/19  Yes Nahser, Wonda Cheng, MD  aspirin 500 MG tablet Take 500 mg by mouth every 6 (six) hours as needed for pain.   Yes [provider]  atorvastatin (LIPITOR) 40 MG tablet Take 1 tablet (40 mg total) by mouth daily. 04/25/19  Yes Kerin Perna, NP  carvedilol (COREG) 6.25 MG tablet Take 1 tablet (6.25 mg total) by mouth 2 (two) times daily. 04/25/19  Yes Kerin Perna, NP  losartan (COZAAR) 50 MG tablet Take 1 tablet (50 mg total) by mouth daily. 06/21/19  Yes Nahser, Wonda Cheng, MD  potassium chloride SA (KLOR-CON) 20 MEQ tablet Take 1 tablet (20 mEq total) by mouth daily. 06/21/19  Yes Nahser, Wonda Cheng, MD  spironolactone (ALDACTONE) 25 MG  tablet Take 1 tablet (25 mg total) by mouth daily. 06/21/19  Yes Nahser, Wonda Cheng, MD    Allergies    Ibuprofen  Review of Systems   Review of Systems  Constitutional: Negative.   HENT: Negative.   Respiratory: Positive for cough (Chronic) and shortness of breath. Negative for apnea, choking, chest tightness, wheezing and stridor.   Cardiovascular: Negative.   Gastrointestinal: Negative.   Genitourinary: Negative.   Musculoskeletal: Negative.   Skin: Negative.   Neurological: Positive for numbness. Negative for dizziness, tremors, seizures, syncope, facial asymmetry, speech difficulty, weakness, light-headedness and headaches.  All other systems  reviewed and are negative.   Physical Exam Updated Vital Signs BP (!) 152/77 (BP Location: Right Arm)    Pulse 99    Temp 98 F (36.7 C) (Oral)    Resp 20    Ht 5\' 5"  (1.651 m)    Wt 66.7 kg    SpO2 99%    BMI 24.46 kg/m   Physical Exam Physical Exam  Constitutional: Pt is oriented to person, place, and time. Pt appears well-developed and well-nourished. No distress.  HENT:  Head: Normocephalic and atraumatic.  Mouth/Throat: Oropharynx is clear and moist.  Eyes: Conjunctivae and EOM are normal. Pupils are equal, round, and reactive to light. No scleral icterus.  No horizontal, vertical or rotational nystagmus  Neck: Normal range of motion. Neck supple.  Full active and passive ROM without pain No midline or paraspinal tenderness No nuchal rigidity or meningeal signs  Cardiovascular: Normal rate, regular rhythm and intact distal pulses.   Pulmonary/Chest: Effort normal. No respiratory distress.  Mild diffuse expiratory wheeze no rales.  Speaks in full sentences without difficulty Abdominal: Soft. Bowel sounds are normal. There is no tenderness. There is no rebound and no guarding.  Musculoskeletal: Normal range of motion.  Lymphadenopathy:    No cervical adenopathy.  Neurological: Pt. is alert and oriented to person, place, and time.  He has normal reflexes. No cranial nerve deficit.  Exhibits normal muscle tone. Coordination normal.  Mental Status:  Alert, oriented, thought content appropriate. Speech fluent without evidence of aphasia. Able to follow 2 step commands without difficulty.  Cranial Nerves:  II:  Peripheral visual fields grossly normal, pupils equal, round, reactive to light III,IV, VI: ptosis not present, extra-ocular motions intact bilaterally  V,VII: smile symmetric, facial light touch sensation equal VIII: hearing grossly normal bilaterally  IX,X: midline uvula rise  XI: bilateral shoulder shrug equal and strong XII: midline tongue extension  Motor:  5/5 in upper and lower extremities bilaterally including strong and equal grip strength and dorsiflexion/plantar flexion Sensory: Pinprick and light touch normal in all extremities.  Deep Tendon Reflexes: 2+ and symmetric  Cerebellar: normal finger-to-nose with bilateral upper extremities Gait: normal gait and balance CV: distal pulses palpable throughout   Skin: Skin is warm and dry. No rash noted. Pt is not diaphoretic.  Psychiatric: Pt has a normal mood and affect. Behavior is normal. Judgment and thought content normal.  Nursing note and vitals reviewed. ED Results / Procedures / Treatments   Labs (all labs ordered are listed, but only abnormal results are displayed) Labs Reviewed  CBC - Abnormal; Notable for the following components:      Result Value   RDW 15.6 (*)    All other components within normal limits  BASIC METABOLIC PANEL  BRAIN NATRIURETIC PEPTIDE  TROPONIN I (HIGH SENSITIVITY)    EKG None  Radiology DG Chest 2 View  Result Date: 10/13/2019 CLINICAL DATA:  Shortness of breath. EXAM: CHEST - 2 VIEW COMPARISON:  Chest x-ray dated March 19, 2019. CT chest dated April 07, 2016. FINDINGS: The heart size and mediastinal contours are within normal limits. New mild diffuse coarse interstitial thickening. No focal consolidation,  pleural effusion, or pneumothorax. No acute osseous abnormality. IMPRESSION: 1. New mild diffuse coarse interstitial thickening may reflect smoking-related interstitial pneumonitis, less likely interstitial edema. Electronically Signed   By: Titus Dubin M.D.   On: 10/13/2019 12:27   MR BRAIN WO CONTRAST  Result Date: 10/13/2019 CLINICAL DATA:  Intermittent left arm numbness. Trunk numbness or tingling. EXAM:  MRI HEAD WITHOUT CONTRAST TECHNIQUE: Multiplanar, multiecho pulse sequences of the brain and surrounding structures were obtained without intravenous contrast. COMPARISON:  CT head 08/28/2017 FINDINGS: Brain: Bilateral periventricular white matter lesions. The pattern is nonspecific but is suspicious for multiple sclerosis. Right frontal periventricular white matter lesion is low signal on T1 and high signal on T2, suggestive of multiple sclerosis. Differential also includes chronic ischemia. Small hyperintensity right medial cerebellum could be an area of chronic infarction. Negative for hemorrhage or mass. Ventricle size and cerebral volume normal. Vascular: Normal arterial flow voids. Skull and upper cervical spine: Negative Sinuses/Orbits: Mild mucosal edema paranasal sinuses. Negative orbit Other: None IMPRESSION: Mild changes in the white matter. Possible multiple sclerosis. Right frontal periventricular white matter lesion particularly suspicious for multiple sclerosis. Differential diagnosis also includes chronic ischemic change. No acute abnormality. Electronically Signed   By: Franchot Gallo M.D.   On: 10/13/2019 15:36   MR BRAIN W CONTRAST  Result Date: 10/13/2019 CLINICAL DATA:  Intermittent left arm numbness. Abnormal MRI earlier today. Follow-up postcontrast evaluation. EXAM: MRI HEAD WITH CONTRAST TECHNIQUE: Multiplanar, multiecho pulse sequences of the brain and surrounding structures were obtained with intravenous contrast. CONTRAST:  45mL GADAVIST GADOBUTROL 1 MMOL/ML IV SOLN  COMPARISON:  Earlier same day FINDINGS: Brain: Postcontrast imaging does not show any contrast enhancement associated with any of the white matter foci shown on the previous study, which are obviously unchanged over the last few hours. In a patient with a history of hypertension and smoking, I would favor the diagnosis of small-vessel disease over demyelinating disease. Vascular: Major vessels at the base of the brain show flow. Skull and upper cervical spine: Otherwise negative Sinuses/Orbits: Clear/negative Other: None IMPRESSION: Postcontrast imaging does not show any abnormal brain enhancement. Given that the patient has a history of hypertension and smoking, I would favor that the white matter changes described earlier are secondary to small vessel disease more likely than demyelinating disease. Electronically Signed   By: Nelson Chimes M.D.   On: 10/13/2019 18:18   MR Cervical Spine W or Wo Contrast  Result Date: 10/13/2019 CLINICAL DATA:  Multiple sclerosis.  Intermittent left arm numbness. EXAM: MRI CERVICAL SPINE WITHOUT AND WITH CONTRAST TECHNIQUE: Multiplanar and multiecho pulse sequences of the cervical spine, to include the craniocervical junction and cervicothoracic junction, were obtained without and with intravenous contrast. CONTRAST:  73mL GADAVIST GADOBUTROL 1 MMOL/ML IV SOLN COMPARISON:  None. FINDINGS: Alignment: Straightening of the normal cervical lordosis. Vertebrae: No fracture.  Chronic discogenic marrow changes. Cord: No primary cord lesion.  See below regarding stenosis at C3-4. Posterior Fossa, vertebral arteries, paraspinal tissues: Negative Disc levels: No abnormality at the foramen magnum, C1-2 or C2-3. At C3-4, there is a broad-based, centrally predominant disc herniation. This encroaches upon the spinal canal, effacing the subarachnoid space and deforming the cord. AP diameter of the canal at this level only 5.4 mm. Early abnormal T2 cord signal. No foraminal stenosis. C4-5:  Bulging of the disc. No compressive canal or foraminal narrowing. C5-6: Bulging of the disc. Uncovertebral prominence bilaterally. No compressive canal stenosis. Bilateral foraminal narrowing left more than right. Either C6 nerve could be affected. C6-7: Bulging of the disc. No compressive canal or foraminal narrowing. C7-T1: Minimal disc bulge.  No stenosis. 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 AP diameter of the canal in the midline only 5.4 mm. Effacement of the subarachnoid space and flattening of the cord. Abnormal cord T2 signal associated with this.  The presumption is that the T2 cord signal is due to the cord deformity rather than a primary underlying cord process such as multiple sclerosis. Degenerative spondylosis at C5-6 with bilateral foraminal narrowing that has some potential to affect the C6 nerves. Disc bulges at C4-5 and C6-7 without apparent neural compression Electronically Signed   By: Nelson Chimes M.D.   On: 10/13/2019 18:14   CT Angio Chest/Abd/Pel for Dissection W and/or Wo Contrast  Result Date: 10/13/2019 CLINICAL DATA:  Shortness of breath EXAM: CT ANGIOGRAPHY CHEST, ABDOMEN AND PELVIS TECHNIQUE: Multidetector CT imaging through the chest, abdomen and pelvis was performed using the standard protocol prior to and during bolus administration of intravenous contrast. Multiplanar reconstructed images and MIPs were obtained and reviewed to evaluate the vascular anatomy. CONTRAST:  183mL OMNIPAQUE IOHEXOL 350 MG/ML SOLN COMPARISON:  CT abdomen pelvis dated 12/24/2018 and CT chest dated 04/07/2016 FINDINGS: CTA CHEST FINDINGS Cardiovascular: No aortic intramural hematoma. Preferential opacification of the thoracic aorta. No evidence of thoracic aortic aneurysm or dissection. Within the limits of the exam, no evidence of pulmonary embolism. Two vessel aortic arch. Normal heart size. No pericardial effusion. Mediastinum/Nodes: No  enlarged mediastinal, hilar, or axillary lymph nodes. Thyroid gland, trachea, and esophagus demonstrate no significant findings. Lungs/Pleura: An 11 mm solid pulmonary nodule in the left upper lobe (series 9, image 38) is not significantly changed since 04/07/2016 in therefore benign. Mild centrilobular emphysema is noted. There are mild diffuse bilateral ground-glass opacities which may reflect pulmonary edema. There is minimal bilateral dependent atelectasis. There is no pleural effusion or pneumothorax. Musculoskeletal: No chest wall abnormality. No acute or significant osseous findings. Review of the MIP images confirms the above findings. CTA ABDOMEN AND PELVIS FINDINGS VASCULAR Aorta: Atherosclerotic disease. Normal caliber aorta without aneurysm, dissection, vasculitis or significant stenosis. Celiac: Patent without evidence of aneurysm, dissection, vasculitis or significant stenosis. SMA: Patent without evidence of aneurysm, dissection, vasculitis or significant stenosis. Renals: Both renal arteries are patent without evidence of aneurysm, dissection, vasculitis, fibromuscular dysplasia or significant stenosis. IMA: Patent without evidence of aneurysm, dissection, vasculitis or significant stenosis. Inflow: Atherosclerotic disease without evidence of aneurysm, dissection, vasculitis or significant stenosis. Veins: No obvious venous abnormality within the limitations of this arterial phase study. Review of the MIP images confirms the above findings. NON-VASCULAR Hepatobiliary: No focal liver abnormality is seen. No gallstones, gallbladder wall thickening, or biliary dilatation. Pancreas: Unremarkable. No pancreatic ductal dilatation or surrounding inflammatory changes. Spleen: Normal in size without focal abnormality. Adrenals/Urinary Tract: Adrenal glands are unremarkable. Other than a 1.5 cm left renal cyst, the kidneys are normal, without renal calculi, focal lesion, or hydronephrosis. Bladder is  unremarkable. Stomach/Bowel: Stomach is within normal limits. Appendix appears normal. No evidence of bowel wall thickening, distention, or inflammatory changes. Lymphatic: No enlarged abdominal or pelvic lymph nodes. Reproductive: Uterus and bilateral adnexa are unremarkable. Other: No abdominal wall hernia or abnormality. No abdominopelvic ascites. Musculoskeletal: No acute or significant osseous findings. Review of the MIP images confirms the above findings. IMPRESSION: 1. No evidence of acute intramural hematoma, dissection or aneurysm of the aorta. 2. Mild diffuse bilateral ground-glass opacities may reflect pulmonary edema. 3. No acute process in the abdomen or pelvis. Aortic Atherosclerosis (ICD10-I70.0) and Emphysema (ICD10-J43.9). Electronically Signed   By: Zerita Boers M.D.   On: 10/13/2019 15:21    Procedures Procedures (including critical care time)  Medications Ordered in ED Medications  sodium chloride flush (NS) 0.9 % injection 3 mL (3 mLs Intravenous Not Given 10/13/19  1400)  LORazepam (ATIVAN) injection 1 mg (1 mg Intravenous Not Given 10/13/19 1434)  predniSONE (DELTASONE) tablet 60 mg (60 mg Oral Given 10/13/19 1600)  albuterol (VENTOLIN HFA) 108 (90 Base) MCG/ACT inhaler 2 puff (2 puffs Inhalation Given 10/13/19 1600)  iohexol (OMNIPAQUE) 350 MG/ML injection 100 mL (100 mLs Intravenous Contrast Given 10/13/19 1444)  gadobutrol (GADAVIST) 1 MMOL/ML injection 7 mL (7 mLs Intravenous Contrast Given 10/13/19 1737)   ED Course  I have reviewed the triage vital signs and the nursing notes.  Pertinent labs & imaging results that were available during my care of the patient were reviewed by me and considered in my medical decision making (see chart for details).  54 year old female appears otherwise well presents for evaluation multiple complaints.  She is afebrile, nonseptic, not ill-appearing.  Patient with shortness of breath.  She does have some mild expiratory wheeze over does not appear  in any acute respiratory distress.  No evidence of lower extremity edema.  No evidence of DVT on exam.  No associated chest pain, diaphoresis, nausea, vomiting, radiation into left arm or left jaw.  Equal radial pulses bilaterally.  Plan on labs, imaging and EKG  Patient also with intermittent paresthesias to left upper extremity.  Vascularly intact.  No associated headache, weakness, difficulty with word finding, facial droop.  Symptoms not occurring simultaneously.   Labs personally reviewed and interpreted CBC without leukocytosis Metabolic panel without electrolyte, renal abnormality Troponin negative BNP 11.7 Chest x-ray with possible inhalation pneumonitis due to tobacco abuse. EKG without STEMI   1420: Nursing has told me patient is wanting to leave [facility].  Discussed risk of leaving his medical advice with nursing staff in room.  She voices understanding of leaving [facility] [place] however still requesting to leave the emergency department. States she has "Places to be."  Patient states she has changed her mind and will now wait for imaging.  1600: Discussed possible MS findings on MRI.  Discussed with neurology.  Recommend MRI brain with contrast and MR cervical with and without.  Patient states she does have a sister with MS.  We will plan to reconsult neurology if any findings on MRI  1630: Discussed with patient.  She is agreeable to stay for further imaging.  On reevaluation patient states she is not sure if her sister has MS or lupus.  Nonetheless she will have additional imagings to rule out MS at this time   MR brain and cervical spine without any lesions suspicious for MS.  She does have disc herniation.  Dr. Wilson Singer attending has reviewed Imaging states can follow up outpatient with Neurosurgery for cervical herniation.  Discussed results with patient.  She continues have a nonfocal neurologic exam.  No chest pain or shortness of breath.  Low  suspicion for acute vascular, cardiac, pulmonary, infectious etiology of symptoms.  She is to follow-up outpatient.  The patient has been appropriately medically screened and/or stabilized in the ED. I have low suspicion for any other emergent medical condition which would require further screening, evaluation or treatment in the ED or require inpatient management.  Patient is hemodynamically stable and in no acute distress.  Patient able to ambulate in department prior to ED.  Evaluation does not show acute pathology that would require ongoing or additional emergent interventions while in the emergency department or further inpatient treatment.  I have discussed the diagnosis with the patient and answered all questions.  Pain is been managed while in the emergency department  and patient has no further complaints prior to discharge.  Patient is comfortable with plan discussed in room and is stable for discharge at this time.  I have discussed strict return precautions for returning to the emergency department.  Patient was encouraged to follow-up with PCP/specialist refer to at discharge.        MDM Rules/Calculators/A&P                      Windy A Keizer was evaluated in Emergency Department on 10/13/2019 for the symptoms described in the history of present illness. She was evaluated in the context of the global COVID-19 pandemic, which necessitated consideration that the patient might be at risk for infection with the SARS-CoV-2 virus that causes COVID-19. Institutional protocols and algorithms that pertain to the evaluation of patients at risk for COVID-19 are in a state of rapid change based on information released by regulatory bodies including the CDC and federal and state organizations. These policies and algorithms were followed during the patient's care in the ED.  Final Clinical Impression(s) / ED Diagnoses Final diagnoses:  Paresthesia  SOB (shortness of breath)  Cervical herniation      Rx / DC Orders ED Discharge Orders    None       Dreonna Hussein A, PA-C 10/13/19 1956    Charlesetta Shanks, MD 10/14/19 [phone]

## 2019-10-13 NOTE — ED Notes (Signed)
Pt agreeable to stay for further Neurology consult d/t MRI findings.

## 2019-10-13 NOTE — ED Notes (Signed)
Pt up in hall requesting to leave, will not wait for MRI.  PA notified and MRI called.  Will be able to complete soon and pt agrees to stay.

## 2019-10-13 NOTE — ED Notes (Signed)
Returns from CT and MRI and stated she is ready to go

## 2019-10-13 NOTE — ED Triage Notes (Signed)
Pt reports over 1 week she has had left arm numbness that comes and goes. Pt also reports shortness of breath, no cp, cough or fevers.

## 2019-10-25 ENCOUNTER — Encounter (INDEPENDENT_AMBULATORY_CARE_PROVIDER_SITE_OTHER): Payer: Self-pay | Admitting: Primary Care

## 2019-10-25 ENCOUNTER — Telehealth (INDEPENDENT_AMBULATORY_CARE_PROVIDER_SITE_OTHER): Payer: Medicaid Other | Admitting: Primary Care

## 2019-10-25 ENCOUNTER — Other Ambulatory Visit: Payer: Self-pay

## 2019-10-25 ENCOUNTER — Other Ambulatory Visit: Payer: Self-pay | Admitting: Nurse Practitioner

## 2019-10-25 DIAGNOSIS — I10 Essential (primary) hypertension: Secondary | ICD-10-CM

## 2019-10-25 MED ORDER — CARVEDILOL 6.25 MG PO TABS: 6.2500 mg | ORAL_TABLET | Freq: Two times a day (BID) | ORAL | 3 refills | Status: DC

## 2019-10-25 MED FILL — ?CARVEDILOL 6.25 MG TABLET: 6.25 | 30 days supply | Qty: 60 | Fill #0

## 2019-10-25 NOTE — Progress Notes (Signed)
Pt has not had medication this morning or checked Bp because she Just got off work  Bp on 10/24/19 120/68  Does not check everyday- checks about twice a week

## 2019-10-25 NOTE — Progress Notes (Signed)
Virtual Visit via Telephone Note  I connected with Rachael Stone on 10/25/19 at 10:30 AM EDT by telephone and verified that I am speaking with the correct person using two identifiers.   I discussed the limitations, risks, security and privacy concerns of performing an evaluation and management service by telephone and the availability of in person appointments. I also discussed with the patient that there may be a patient responsible charge related to this service. The patient expressed understanding and agreed to proceed.   History of Present Illness: Rachael Stone initially was having tele visit for  medication refill. She is out of carvedilol 6.25 mg twice daily. Reviewed encounters she is followed by Dr. Kristopher Oppenheim cardiology and blood pressure is well controlled.    Past Medical History:  Diagnosis Date  . Back pain   . Cardiomyopathy ([place])    a. 04/08/2016 Echo: EF 35%, diff HK, mild LVH, Gr2 DD, mod AI, mod to sev MR, mildly dil LA.  [name] DEPRESSIVE DISORDER NOT ELSEWHERE CLASSIFIED 12/14/2008   Qualifier: Diagnosis of  By: [name], Junie Panning    . DYSFUNCTIONAL UTERINE BLEEDING 11/12/2007   Qualifier: Diagnosis of  By: [name], Junie Panning    . ETOH abuse   . Hypertensive heart disease with heart failure ([place])   . Marijuana abuse   . MENORRHAGIA 12/14/2008   Qualifier: Diagnosis of  By: [name], Junie Panning    . Moderate aortic insufficiency    a. 04/08/2016 Echo: mod AI.  [name] Moderate to Severe Mitral Regurgitation    a. 04/08/2016 Echo: mod-sev MR directed centrally.  . Tobacco abuse     Observations/Objective: Review of Systems  All other systems reviewed and are negative.  Assessment and Plan: Rachael Stone was seen today for blood pressure check and medication refill.  Diagnoses and all orders for this visit:  Essential hypertension Blood pressure is well controlled and manage by Dr. Acie Fredrickson. Will contact him in regards refilling carvedilol .  goal met of less than 130/80, low-sodium, DASH  diet, medication compliance, 150 minutes of moderate intensity exercise per week.  Follow Up Instructions:    I discussed the assessment and treatment plan with the patient. The patient was provided an opportunity to ask questions and all were answered. The patient agreed with the plan and demonstrated an understanding of the instructions.   The patient was advised to call back or seek an in-person evaluation if the symptoms worsen or if the condition fails to improve as anticipated.  I provided 8 minutes of non-face-to-face time during this encounter.   Kerin Perna, NP

## 2019-10-26 ENCOUNTER — Telehealth: Payer: Self-pay | Admitting: Cardiovascular Disease

## 2019-10-26 DIAGNOSIS — I351 Nonrheumatic aortic (valve) insufficiency: Secondary | ICD-10-CM

## 2019-10-26 DIAGNOSIS — I340 Nonrheumatic mitral (valve) insufficiency: Secondary | ICD-10-CM

## 2019-10-26 DIAGNOSIS — I5042 Chronic combined systolic (congestive) and diastolic (congestive) heart failure: Secondary | ICD-10-CM

## 2019-10-26 DIAGNOSIS — I5022 Chronic systolic (congestive) heart failure: Secondary | ICD-10-CM

## 2019-10-26 NOTE — Telephone Encounter (Signed)
New message   Pt c/o Shortness Of Breath: STAT if SOB developed within the last 24 hours or pt is noticeably SOB on the phone  1. Are you currently SOB (can you hear that pt is SOB on the phone)?no   2. How long have you been experiencing SOB? Per patient 2 weeks  3. Are you SOB when sitting or when up moving around? Moving around   4. Are you currently experiencing any other symptoms? No

## 2019-10-27 MED FILL — AMLODIPINE BESYLATE 5 MG TA: 5 | 30 days supply | Qty: 30 | Fill #2

## 2019-10-27 MED FILL — POTASSIUM CL ER 20 MEQ TABL: 20 | 30 days supply | Qty: 30 | Fill #2

## 2019-10-27 MED FILL — ?SPIRONOLACTONE 25 MG TABLE: 25 | 30 days supply | Qty: 30 | Fill #2

## 2019-10-27 MED FILL — ?ATORVASTATIN 40MG TABLET: 40 | 30 days supply | Qty: 30 | Fill #1

## 2019-10-27 MED FILL — LOSARTAN POTASSIUM 50 MG TA: 50 | 30 days supply | Qty: 30 | Fill #2

## 2019-10-27 NOTE — Telephone Encounter (Signed)
Called patient to discuss her SOB. She states her symptoms continue to be bothersome. Her PCP advised her that she was supposed to be taking carvedilol and she did not realize she had run out. I advised that a new Rx has been sent to her pharmacy and she states she will go and pick it up. I advised that per Dr. Acie Fredrickson, we can order an echo to assess her heart valves and pumping function since it has been > 2 years since her last one. I also offered her an appointment with Dr. Acie Fredrickson for next week. She states she would like to go ahead and get the echo and follow-up with Dr. Acie Fredrickson afterwards. I advised that our scheduler will call her to schedule the echo and we will plan follow-up based on the results. She verbalized agreement and thanked me for the call.

## 2019-11-14 ENCOUNTER — Ambulatory Visit (HOSPITAL_COMMUNITY): Payer: Self-pay | Attending: Cardiology

## 2019-11-14 ENCOUNTER — Other Ambulatory Visit: Payer: Self-pay

## 2019-11-14 DIAGNOSIS — I340 Nonrheumatic mitral (valve) insufficiency: Secondary | ICD-10-CM | POA: Insufficient documentation

## 2019-11-14 DIAGNOSIS — I5042 Chronic combined systolic (congestive) and diastolic (congestive) heart failure: Secondary | ICD-10-CM | POA: Insufficient documentation

## 2019-11-14 DIAGNOSIS — I351 Nonrheumatic aortic (valve) insufficiency: Secondary | ICD-10-CM | POA: Insufficient documentation

## 2019-12-28 IMAGING — DX PORTABLE CHEST - 1 VIEW
1 series · 1 of 1 positions shown · non-contrast
Comparison: 01/21/2018

CLINICAL DATA: Shortness of breath.

EXAM:
PORTABLE CHEST 1 VIEW

[chest ap]
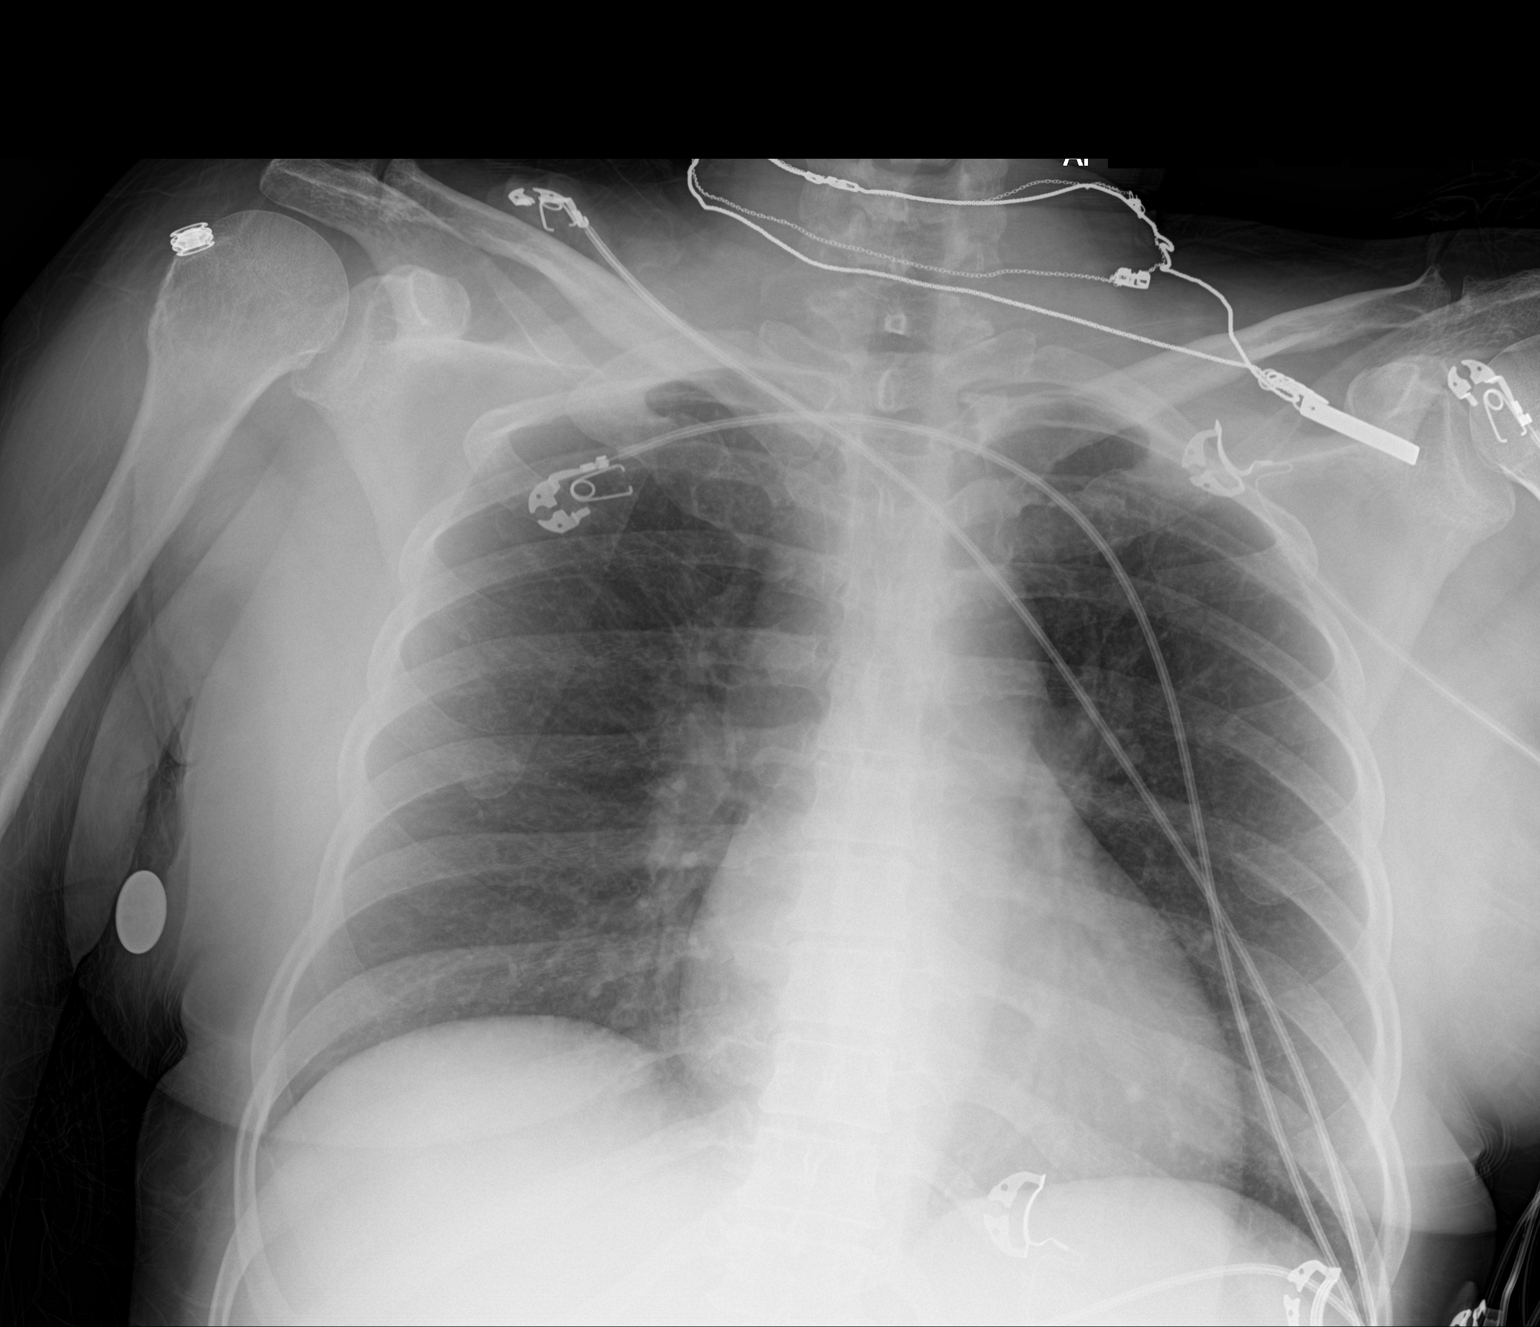

[1 of 1 positions shown; findings below may reference images not displayed]

FINDINGS: The cardiac silhouette is upper limits of normal in size,
accentuated by portable AP technique. No airspace consolidation,
edema, pleural effusion, pneumothorax is identified. No acute
osseous abnormality is seen.
IMPRESSION: No active disease.

## 2019-12-28 IMAGING — CT CT ABDOMEN AND PELVIS WITH CONTRAST
2 of 5 series · 16 of 46 positions shown, 18 images · IV contrast (ISOVUE)
Comparison: None.

CLINICAL DATA: Pain

EXAM:
CT ABDOMEN AND PELVIS WITH CONTRAST
TECHNIQUE: Multidetector CT imaging of the abdomen and pelvis was performed
using the standard protocol following bolus administration of
intravenous contrast.
CONTRAST:  100mL OMNIPAQUE IOHEXOL 300 MG/ML  SOLN

[Series 2: axial st · axial · 0.73mm/px · z∈[-436,-41]mm · 13 of 93 slices shown, 15 images]
[im 7/93  soft-tissue]
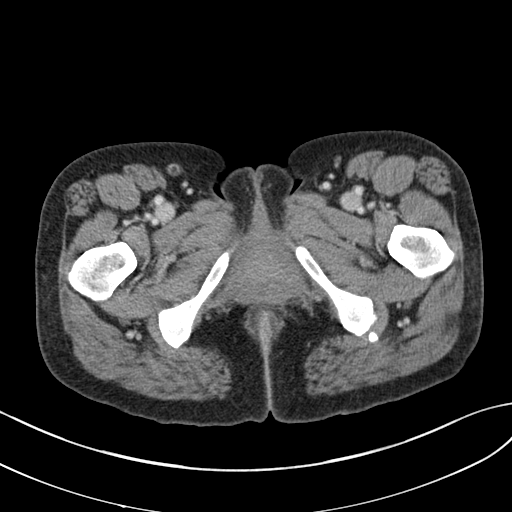
[im 7/93  bone]
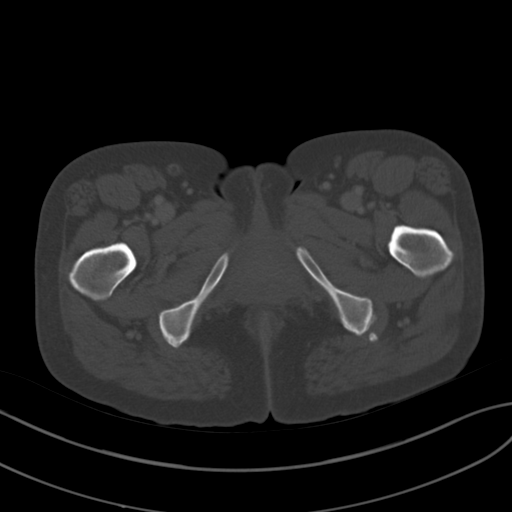
[im 13/93  soft-tissue]
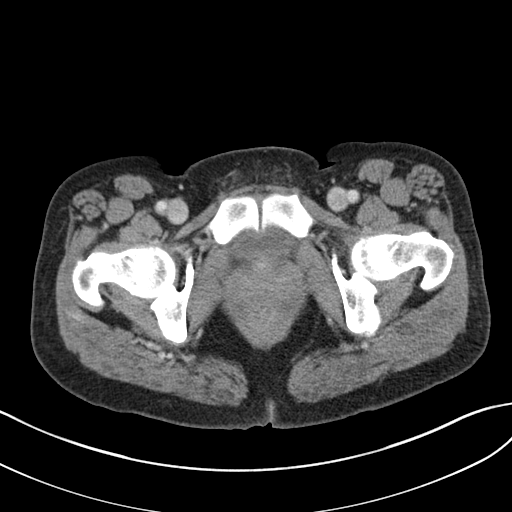
[im 19/93  soft-tissue]
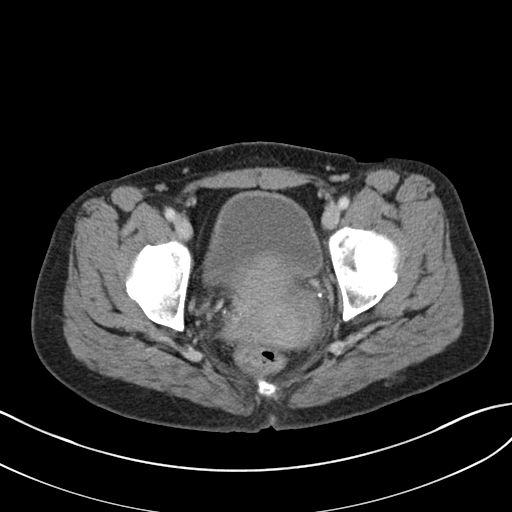
[im 25/93  soft-tissue]
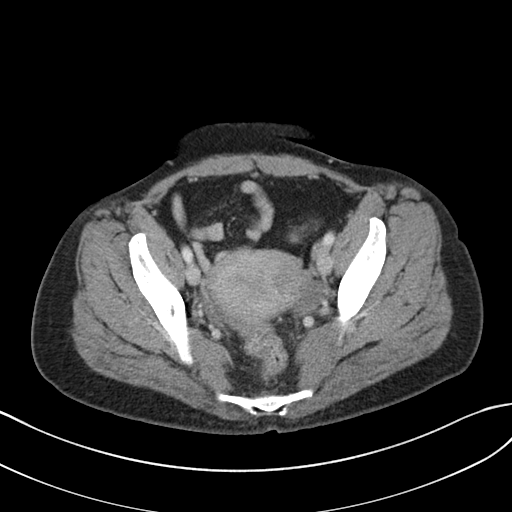
[im 31/93  soft-tissue]
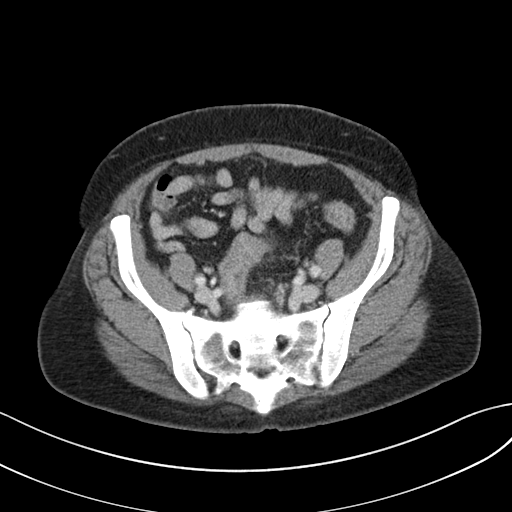
[im 37/93  soft-tissue]
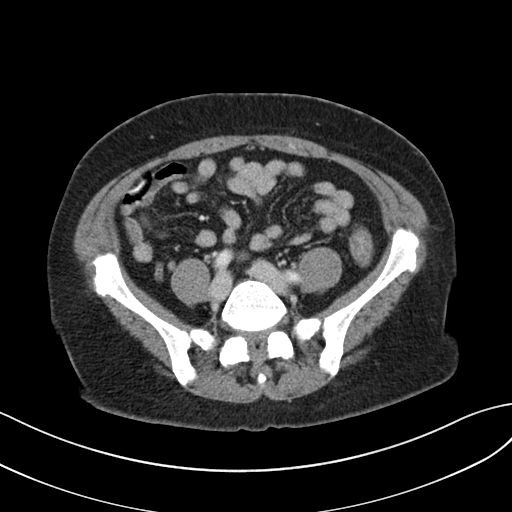
[im 50/93  soft-tissue]
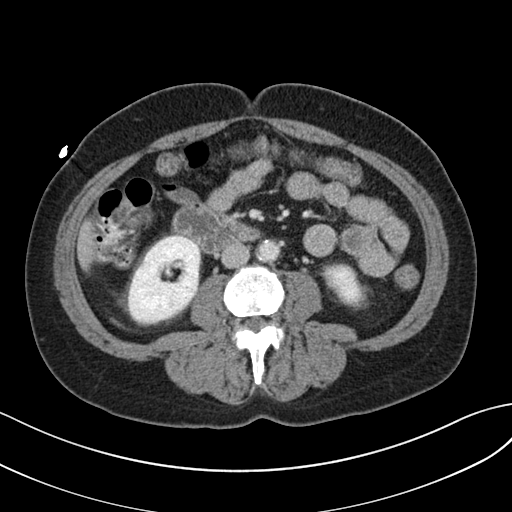
[im 56/93  soft-tissue]
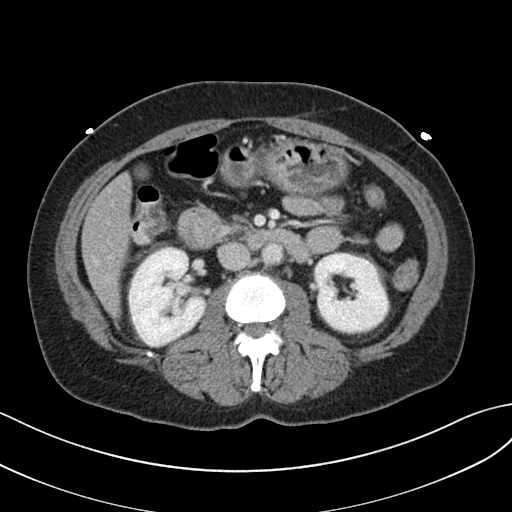
[im 62/93  soft-tissue]
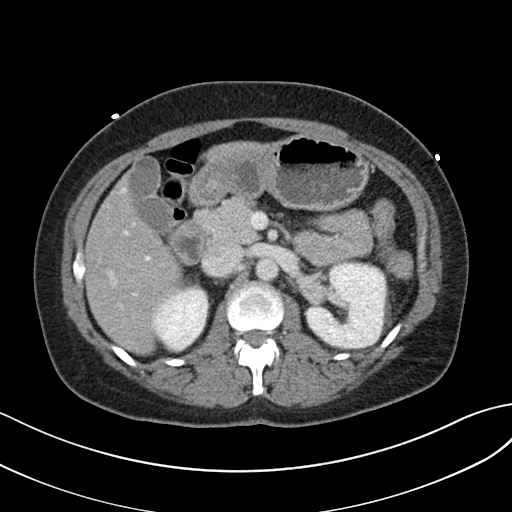
[im 62/93  bone]
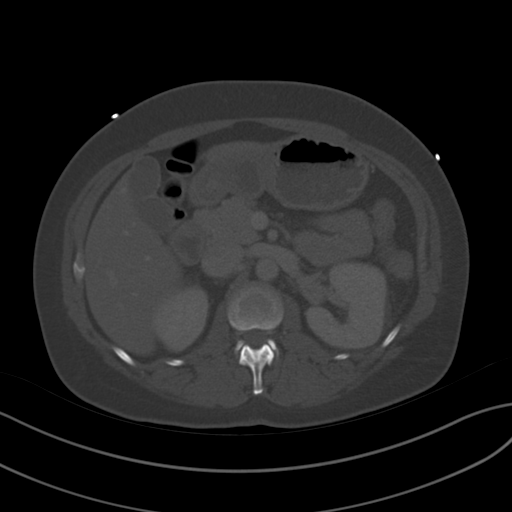
[im 68/93  soft-tissue]
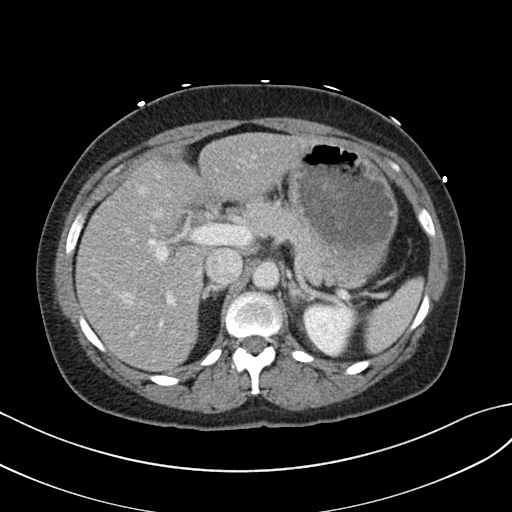
[im 74/93  soft-tissue]
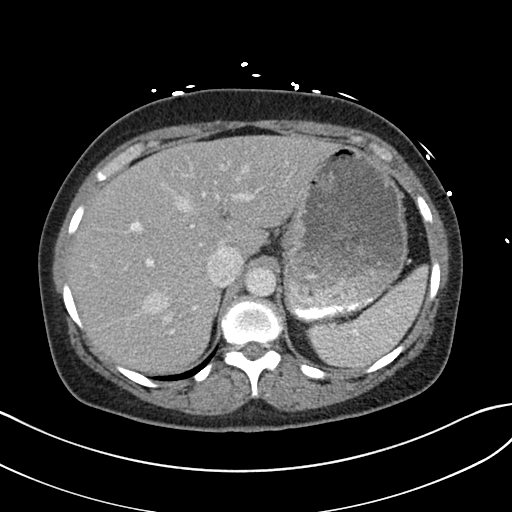
[im 80/93  soft-tissue]
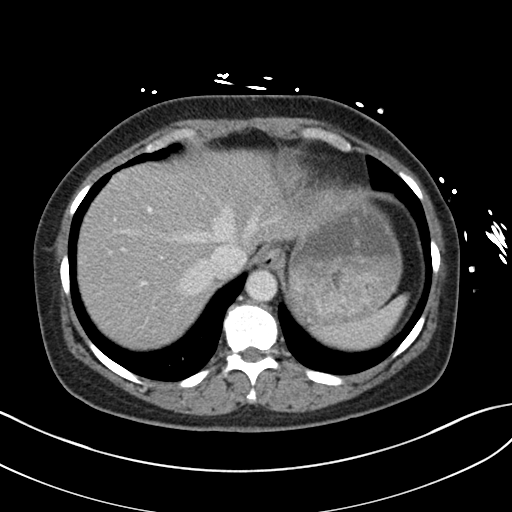
[im 86/93  soft-tissue]
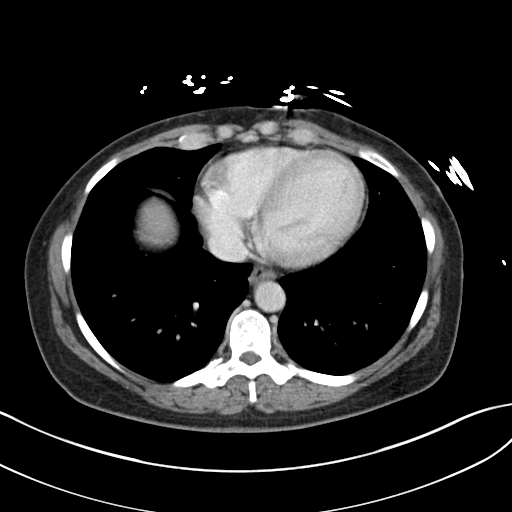

[Series 5: coronal st · coronal · 0.69mm/px · 3 of 131 slices shown]
[im 44/131  soft-tissue]
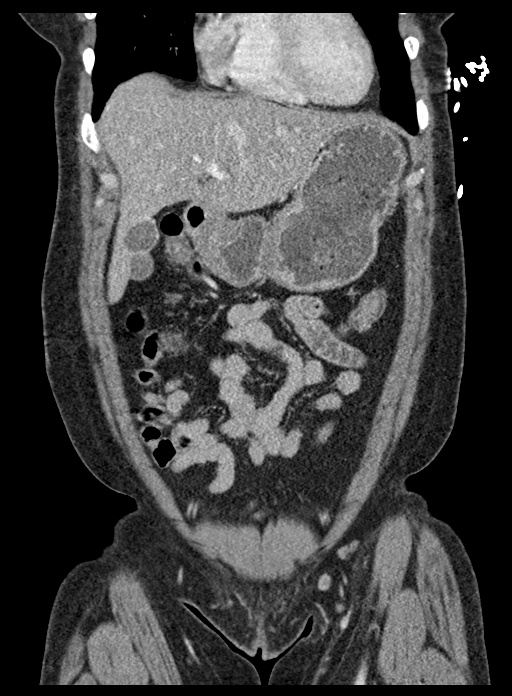
[im 58/131  soft-tissue]
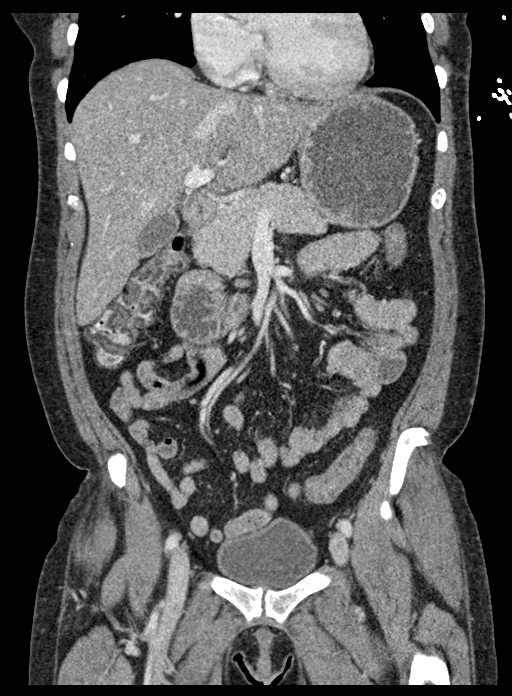
[im 73/131  soft-tissue]
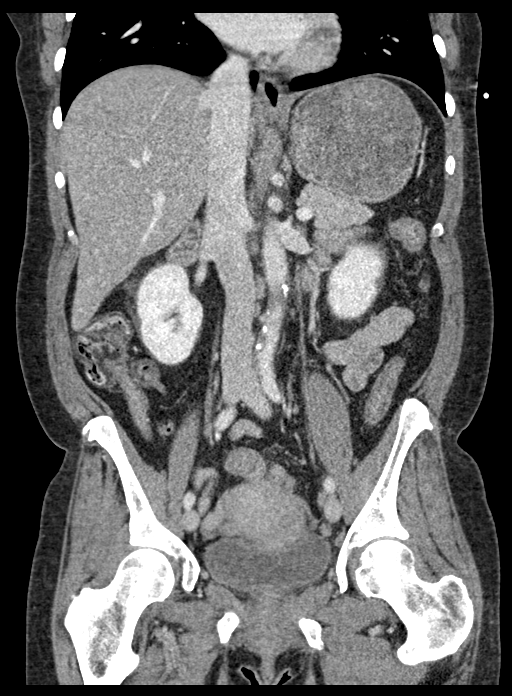

[16 of 46 positions shown; findings below may reference images not displayed]

FINDINGS: Lower chest: No acute abnormality.

Hepatobiliary: No focal liver abnormality is seen. There is hepatic
steatosis. No gallstones, gallbladder wall thickening, or biliary
dilatation.

Pancreas: Unremarkable. No pancreatic ductal dilatation or
surrounding inflammatory changes.

Spleen: Normal in size without focal abnormality.

Adrenals/Urinary Tract: Adrenal glands are unremarkable. Kidneys are
normal, without renal calculi, focal lesion, or hydronephrosis.
Bladder is unremarkable.

Stomach/Bowel: There is some mild wall thickening of the colon which
is felt to be secondary to underdistention. The stomach is
unremarkable. The appendix is unremarkable. There is no evidence of
a small-bowel obstruction.

Vascular/Lymphatic: Aortic atherosclerosis. No enlarged abdominal or
pelvic lymph nodes.

Reproductive: There is a probable fibroid uterus. There is no
discrete ovarian mass.

Other: No abdominal wall hernia or abnormality. No abdominopelvic
ascites.

Musculoskeletal: No acute or significant osseous findings.
IMPRESSION: 1. No acute abnormality.
2. Hepatic steatosis.
3.  Aortic Atherosclerosis ([id number]).
4. Fibroid uterus.

## 2020-01-02 ENCOUNTER — Ambulatory Visit: Payer: Self-pay | Admitting: Cardiovascular Disease

## 2020-01-13 ENCOUNTER — Other Ambulatory Visit: Payer: Self-pay

## 2020-01-13 ENCOUNTER — Encounter: Payer: Self-pay | Admitting: Cardiovascular Disease

## 2020-01-13 ENCOUNTER — Ambulatory Visit (INDEPENDENT_AMBULATORY_CARE_PROVIDER_SITE_OTHER): Payer: Self-pay | Admitting: Cardiovascular Disease

## 2020-01-13 VITALS — BP 106/58 | HR 84 | Ht 65.0 in | Wt 134.8 lb

## 2020-01-13 DIAGNOSIS — I5041 Acute combined systolic (congestive) and diastolic (congestive) heart failure: Secondary | ICD-10-CM

## 2020-01-13 DIAGNOSIS — I351 Nonrheumatic aortic (valve) insufficiency: Secondary | ICD-10-CM

## 2020-01-13 NOTE — Patient Instructions (Signed)
Medication Instructions:  Your physician recommends that you continue on your current medications as directed. Please refer to the Current Medication list given to you today.  *If you need a refill on your cardiac medications before your next appointment, please call your pharmacy*   Lab Work: None Ordered If you have labs (blood work) drawn today and your tests are completely normal, you will receive your results only by:  [place] (if you have MyChart) OR  A paper copy in the mail If you have any lab test that is abnormal or we need to change your treatment, we will call you to review the results.   Testing/Procedures: None Ordered   Follow-Up: At [facility], you and your health needs are our priority.  As part of our continuing mission to provide you with exceptional heart care, we have created designated Provider Care Teams.  These Care Teams include your primary Cardiologist (physician) and Advanced Practice Providers (APPs -  Physician Assistants and Nurse Practitioners) who all work together to provide you with the care you need, when you need it.  We recommend signing up for the patient portal called "MyChart".  Sign up information is provided on this After Visit Summary.  MyChart is used to connect with patients for Virtual Visits (Telemedicine).  Patients are able to view lab/test results, encounter notes, upcoming appointments, etc.  Non-urgent messages can be sent to your provider as well.   To learn more about what you can do with MyChart, go to [email or web address].    Your next appointment:   6 month(s)  The format for your next appointment:   In Person  Provider:   You may see Mertie Moores, MD or one of the following Advanced Practice Providers on your designated Care Team:    Richardson Dopp, PA-C  [place], [place]

## 2020-01-13 NOTE — Progress Notes (Signed)
Cardiology Office Note   Date:  01/13/2020   ID:  Rachael Stone, DOB 05/14/1966, MRN [id number]  PCP:  Kerin Perna, NP  Cardiologist:  Dr. Acie Fredrickson    Chief Complaint  Patient presents with  . Hypertension  . Congestive Heart Failure      Notes from [name]: Rachael Stone is a 54 y.o. female who presents for post hospital for progressive dyspnea and orthopnea and has been found to have LV dysfunction (EF 35%), mod AI, and mod - sev MR.  She has a  h/o tob, alcohol, marijuana, and remote cocaine abuse.  She has no prior cardiac hx.  She had CTA of chest without PE + pulmonary edema and LUL nodule of 71mm.  Echo with EF 30% and mod-sev MR and mod AI. [name]    She had R & L cardiac cath   LV end diastolic pressure is normal.  There is moderate left ventricular systolic dysfunction.  The left ventricular ejection fraction is 35% by visual estimate.  There is no aortic valve stenosis.  LV end diastolic pressure is normal  Normal coronary arteries.  She has done well no further SOB, swelling, which was in her abd or chest pain.  She has decreased cigarettes to 5 per day down from 10 per day.  Could not afford nicotene patches.  She has cut out salt in her diet.  Unfortunately she has not taken her meds for 2 days, she is worried they will hurt her heart.  We had a long discussion on what happened to her heart and the weakness of her ventricle. We discussed how important the meds were to improve her heart.     Feb.   2, 2018: Seen with her sister, Rachael Stone.   She stopped her meds when they ran out in Dec.   Says she felt better off the meds.   She says that she does not have any symptoms.   Does not want to restart meds.   Saw [name] who refilled her meds.  Patient did not Get her medications filled. She states that she does not understand why she needs to take these medications and does not want to take medications or her life.  Nov 14, 2016:  Rachael Stone is  seen today with her sister, Rachael Stone.  No CP or dyspnea.    Donates plasma twice a week.   BP is typically normal there  Dec. 6,2018:  Rachael Stone had an echocardiogram recently that revealed worsening systolic congestive heart failure and moderate to severe aortic insufficiency. Seen with her sister , Rachael Stone  No real increase in dyspnea.   September 11, 2017:   Rachael Stone is seen by her self today . Seems to be doing well   Has mild dyspnea.   Had a TEE  Her ejection fraction is now normal with an EF of 55-60%.  She has moderate aortic insufficiency, mild mitral regurgitation Her left ventricular function has improved and her aortic insufficiency and mitral regurgitation have both improved as well since her previous echo.  Complains about having to urinate so much with the lasix   Dec. 8 2020 :  Rachael Stone is seen today for follow-up of her congestive heart failure, mild mitral vegetation, hyperlipidemia.  She also has mild to moderate aortic insufficiency. Avoids salty foods.   January 13, 2020;   Rachael Stone is seen today  Echo recently showed normal LV size and function Moderate -severe AI  No real symptoms.  Not much exercise    Past Medical History:  Diagnosis Date  . Back pain   . Cardiomyopathy ([place])    a. 04/08/2016 Echo: EF 35%, diff HK, mild LVH, Gr2 DD, mod AI, mod to sev MR, mildly dil LA.  [name] DEPRESSIVE DISORDER NOT ELSEWHERE CLASSIFIED 12/14/2008   Qualifier: Diagnosis of  By: [name], Junie Panning    . DYSFUNCTIONAL UTERINE BLEEDING 11/12/2007   Qualifier: Diagnosis of  By: [name], Junie Panning    . ETOH abuse   . Hypertensive heart disease with heart failure ([place])   . Marijuana abuse   . MENORRHAGIA 12/14/2008   Qualifier: Diagnosis of  By: [name], Junie Panning    . Moderate aortic insufficiency    a. 04/08/2016 Echo: mod AI.  [name] Moderate to Severe Mitral Regurgitation    a. 04/08/2016 Echo: mod-sev MR directed centrally.  . Tobacco abuse     Past Surgical History:  Procedure Laterality Date  .  CARDIAC CATHETERIZATION N/A 04/10/2016   Procedure: Right/Left Heart Cath and Coronary Angiography;  Surgeon: [name], MD;  Location: [facility];  Service: Cardiovascular;  Laterality: N/A;  . EXPLORATORY LAPAROTOMY  2003   Ectopic Pregnancy - unsure which side or if tube was removed  . TEE WITHOUT CARDIOVERSION N/A 06/26/2017   Procedure: TRANSESOPHAGEAL ECHOCARDIOGRAM (TEE);  Surgeon: [name], MD;  Location: [facility] ENDOSCOPY;  Service: Cardiovascular;  Laterality: N/A;  . TUBAL LIGATION       Current Outpatient Medications  Medication Sig Dispense Refill  . amLODipine (NORVASC) 5 MG tablet Take 1 tablet (5 mg total) by mouth daily. 90 tablet 3  . aspirin 500 MG tablet Take 500 mg by mouth every 6 (six) hours as needed for pain.    [name] atorvastatin (LIPITOR) 40 MG tablet Take 1 tablet (40 mg total) by mouth daily. 30 tablet 3  . carvedilol (COREG) 6.25 MG tablet Take 1 tablet (6.25 mg total) by mouth 2 (two) times daily. 180 tablet 3  . losartan (COZAAR) 50 MG tablet Take 1 tablet (50 mg total) by mouth daily. 90 tablet 3  . potassium chloride SA (KLOR-CON) 20 MEQ tablet Take 1 tablet (20 mEq total) by mouth daily. 90 tablet 3  . spironolactone (ALDACTONE) 25 MG tablet Take 1 tablet (25 mg total) by mouth daily. 90 tablet 3   No current facility-administered medications for this visit.    Allergies:   Ibuprofen    Social History:  The patient  reports that she has been smoking cigarettes. She has a 7.50 pack-year smoking history. She has never used smokeless tobacco. She reports current alcohol use. She reports current drug use. Drug: Marijuana.   Family History:  The patient's family history includes CAD in her mother; Cirrhosis in her father; Lupus in her sister.    ROS   - negative   Wt Readings from Last 3 Encounters:  01/13/20 134 lb 12.8 oz (61.1 kg)  10/13/19 147 lb (66.7 kg)  07/27/19 148 lb 3.2 oz (67.2 kg)     Physical Exam: Blood pressure (!)  106/58, pulse 84, height 5\' 5"  (1.651 m), weight 134 lb 12.8 oz (61.1 kg), SpO2 98 %.  GEN:  Well nourished, well developed in no acute distress HEENT: Normal NECK: No JVD; No carotid bruits LYMPHATICS: No lymphadenopathy CARDIAC: RRR ,  Soft diastolic murmur  RESPIRATORY:  Clear to auscultation without rales, wheezing or rhonchi  ABDOMEN: Soft, non-tender, non-distended MUSCULOSKELETAL:  No edema; No deformity  SKIN: Warm and dry NEUROLOGIC:  Alert and oriented x 3   EKG:      Recent Labs: 03/19/2019: ALT 21; Magnesium 2.0 10/13/2019: B Natriuretic Peptide 11.7; BUN 9; Creatinine, Ser 0.87; Hemoglobin 13.1; Platelets 194; Potassium 4.0; Sodium 137    Lipid Panel    Component Value Date/Time   CHOL 162 04/25/2019 1045   TRIG 133 04/25/2019 1045   HDL 52 04/25/2019 1045   CHOLHDL 3.1 04/25/2019 1045   CHOLHDL 4.0 Ratio 11/12/2007 2044   VLDL 32 11/12/2007 2044   LDLCALC 87 04/25/2019 1045       Other studies Reviewed: Additional studies/ records that were reviewed today include: .  Cardiac Cath 04/10/16 Conclusion     LV end diastolic pressure is normal.  There is moderate left ventricular systolic dysfunction.  The left ventricular ejection fraction is 35% by visual estimate.  There is no aortic valve stenosis.  LV end diastolic pressure is normal.   Nonischemic cardiomyopathy.  Continue medical therapy.   Indications   Acute combined systolic and diastolic heart failure (HCC) [I50.41 (ICD-10-CM)]  Dyspnea [R06.00 (ICD-10-CM)]   Hemo Data   Flowsheet Row Most Recent Value  Fick Cardiac Output 4.36 L/min  Fick Cardiac Output Index 2.46 (L/min)/BSA  RA A Wave 9 mmHg  RA V Wave 6 mmHg  RA Mean 4 mmHg  RV Systolic Pressure 28 mmHg  RV Diastolic Pressure 3 mmHg  RV EDP 6 mmHg  PA Systolic Pressure 28 mmHg  PA Diastolic Pressure 13 mmHg  PA Mean 20 mmHg  PW A Wave 10 mmHg  PW V Wave 9 mmHg  PW Mean 8 mmHg  AO Systolic Pressure 536 mmHg  AO  Diastolic Pressure 77 mmHg  AO Mean 99 mmHg  LV Systolic Pressure 468 mmHg  LV Diastolic Pressure 7 mmHg  LV EDP 12 mmHg  Arterial Occlusion Pressure Extended Systolic Pressure 032 mmHg  Arterial Occlusion Pressure Extended Diastolic Pressure 76 mmHg  Arterial Occlusion Pressure Extended Mean Pressure 98 mmHg  Left Ventricular Apex Extended Systolic Pressure 122 mmHg  Left Ventricular Apex Extended Diastolic Pressure 8 mmHg  Left Ventricular Apex Extended EDP Pressure 9 mmHg  QP/QS 1  TPVR Index 8.13 HRUI  TSVR Index 40.21 HRUI  PVR SVR Ratio 0.13  TPVR/TSVR Ratio    ECHO: 04/08/16 Study Conclusions  - Left ventricle: The cavity size was mildly dilated. Wall   thickness was increased in a pattern of mild LVH. The estimated   ejection fraction was 35%. Diffuse hypokinesis. Features are   consistent with a pseudonormal left ventricular filling pattern,   with concomitant abnormal relaxation and increased filling   pressure (grade 2 diastolic dysfunction). - Aortic valve: Trileaflet; mildly calcified leaflets. There was no   stenosis. There was moderate regurgitation. There was no   holodiastolic flow reversal noted in the descending thoracic   aorta. - Mitral valve: There was moderate to severe regurgitation directed   centrally. - Left atrium: The atrium was mildly dilated. - Right ventricle: The cavity size was normal. Systolic function   was normal. - Pulmonary arteries: No complete TR doppler jet so unable to   estimate PA systolic pressure. - Systemic veins: IVC measured 1.7 cm with < 50% respirophasic   variation, suggesting RA pressure 8 mmHg.  Impressions:  - Mildly dilated LV with mild LV hypertrophy. EF 35%, diffuse   hypokinesis. Moderate diastolic dysfunction. Moderate aortic   insufficiency with trileaflet aortic valve. Moderate to severe   mitral regurgitation.  MR was central, no definite leaflet   abnormalities noted.    ASSESSMENT AND PLAN:  1.   Acute combined systolic and diastolic heart failure ([place])-   her left ventricular systolic function has improved.  She still has grade 1 diastolic dysfunction.    2.  Hypertensive heart disease with heart failure ([place])    3.   NICM (nonischemic cardiomyopathy) ([place])     4. aortic insufficiency-she has moderate to severe aortic insufficiency.  Her LV size is normal.  She does not have any worsening congestive heart failure symptoms.  We will continue to follow.  We will refer her for aortic valve replacement when she develops signs or symptoms of heart failure or signs of LV enlargement.  5.   Mitral Regurgitation-   Stable   6.   Tobacco abuse.:   Advised cessation   7.   Normal coronary arteries    Mertie Moores, MD  01/13/2020 10:08 AM    [facility] [place],  [address], [place]  [address] Pager [phone] Phone: [phone]; Fax: [phone]

## 2020-02-13 ENCOUNTER — Ambulatory Visit: Payer: Self-pay | Admitting: Cardiovascular Disease

## 2020-02-15 ENCOUNTER — Other Ambulatory Visit: Payer: Self-pay | Admitting: Primary Care

## 2020-02-15 MED FILL — POTASSIUM CL ER 20 MEQ TABL: 20 | 30 days supply | Qty: 30 | Fill #3

## 2020-03-14 ENCOUNTER — Telehealth: Payer: Self-pay | Admitting: Cardiovascular Disease

## 2020-03-14 MED ORDER — AMLODIPINE BESYLATE 5 MG PO TABS: 5.0000 mg | ORAL_TABLET | Freq: Every day | ORAL | 3 refills | Status: DC

## 2020-03-14 MED ORDER — POTASSIUM CHLORIDE CRYS ER 20 MEQ PO TBCR: 20.0000 meq | EXTENDED_RELEASE_TABLET | Freq: Every day | ORAL | 3 refills | Status: DC

## 2020-03-14 MED ORDER — SPIRONOLACTONE 25 MG PO TABS: 25.0000 mg | ORAL_TABLET | Freq: Every day | ORAL | 3 refills | Status: DC

## 2020-03-14 MED ORDER — LOSARTAN POTASSIUM 50 MG PO TABS: 50.0000 mg | ORAL_TABLET | Freq: Every day | ORAL | 3 refills | Status: DC

## 2020-03-14 MED FILL — POTASSIUM CL ER 20 MEQ TAB: 20 | 30 days supply | Qty: 30 | Fill #0

## 2020-03-14 MED FILL — LOSARTAN POTASSIUM 50 MG TA: 50 | 30 days supply | Qty: 30 | Fill #0

## 2020-03-14 MED FILL — SPIRONOLACTONE 25 MG TABLET: 25 | 30 days supply | Qty: 30 | Fill #0

## 2020-03-14 MED FILL — AMLODIPINE BESYLATE 5 MG TA: 5 | 30 days supply | Qty: 30 | Fill #0

## 2020-03-14 NOTE — Telephone Encounter (Signed)
Pt's medications were sent to pt's pharmacy as requested. Confirmation received.  

## 2020-03-14 NOTE — Telephone Encounter (Signed)
*  STAT* If patient is at the pharmacy, call can be transferred to refill team.   1. Which medications need to be refilled? (please list name of each medication and dose if known)  amLODipine (NORVASC) 5 MG tablet losartan (COZAAR) 50 MG tablet  spironolactone (ALDACTONE) 25 MG tablet   2. Which pharmacy/location (including street and city if local pharmacy) is medication to be sent to? [place], [place]  3. Do they need a 30 day or 90 day supply? 90 day supply

## 2020-03-17 ENCOUNTER — Encounter (HOSPITAL_COMMUNITY): Payer: Self-pay | Admitting: Emergency Medicine

## 2020-03-17 ENCOUNTER — Emergency Department (HOSPITAL_COMMUNITY)
Admission: EM | Admit: 2020-03-17 | Discharge: 2020-03-18 | Disposition: A | Discharge status: Home / Self Care | Payer: Medicaid Other | Attending: Emergency Medicine | Admitting: Emergency Medicine

## 2020-03-17 ENCOUNTER — Other Ambulatory Visit: Payer: Self-pay

## 2020-03-17 ENCOUNTER — Emergency Department (HOSPITAL_COMMUNITY): Payer: Medicaid Other

## 2020-03-17 DIAGNOSIS — I5041 Acute combined systolic (congestive) and diastolic (congestive) heart failure: Secondary | ICD-10-CM | POA: Insufficient documentation

## 2020-03-17 DIAGNOSIS — R05 Cough: Secondary | ICD-10-CM | POA: Insufficient documentation

## 2020-03-17 DIAGNOSIS — I110 Hypertensive heart disease with heart failure: Secondary | ICD-10-CM | POA: Insufficient documentation

## 2020-03-17 DIAGNOSIS — R0982 Postnasal drip: Secondary | ICD-10-CM | POA: Insufficient documentation

## 2020-03-17 DIAGNOSIS — R0789 Other chest pain: Secondary | ICD-10-CM | POA: Insufficient documentation

## 2020-03-17 DIAGNOSIS — Z20822 Contact with and (suspected) exposure to covid-19: Secondary | ICD-10-CM | POA: Insufficient documentation

## 2020-03-17 DIAGNOSIS — F17210 Nicotine dependence, cigarettes, uncomplicated: Secondary | ICD-10-CM | POA: Insufficient documentation

## 2020-03-17 DIAGNOSIS — K529 Noninfective gastroenteritis and colitis, unspecified: Secondary | ICD-10-CM | POA: Insufficient documentation

## 2020-03-17 DIAGNOSIS — Z79899 Other long term (current) drug therapy: Secondary | ICD-10-CM | POA: Insufficient documentation

## 2020-03-17 DIAGNOSIS — R200 Anesthesia of skin: Secondary | ICD-10-CM | POA: Insufficient documentation

## 2020-03-17 DIAGNOSIS — J3489 Other specified disorders of nose and nasal sinuses: Secondary | ICD-10-CM | POA: Insufficient documentation

## 2020-03-17 DIAGNOSIS — M791 Myalgia, unspecified site: Secondary | ICD-10-CM | POA: Insufficient documentation

## 2020-03-17 LAB — URINALYSIS, ROUTINE W REFLEX MICROSCOPIC
Bilirubin Urine: NEGATIVE
Glucose, UA: NEGATIVE mg/dL
Hgb urine dipstick: NEGATIVE
Ketones, ur: NEGATIVE mg/dL
Nitrite: NEGATIVE
Protein, ur: 30 mg/dL — AB
Specific Gravity, Urine: 1.027 (ref 1.005–1.030)
pH: 5 (ref 5.0–8.0)

## 2020-03-17 LAB — COMPREHENSIVE METABOLIC PANEL
ALT: 37 U/L (ref 0–44)
AST: 58 U/L — ABNORMAL HIGH (ref 15–41)
Albumin: 3.8 g/dL (ref 3.5–5.0)
Alkaline Phosphatase: 63 U/L (ref 38–126)
Anion gap: 14 (ref 5–15)
BUN: <5 mg/dL — ABNORMAL LOW (ref 6–20)
CO2: 21 mmol/L — ABNORMAL LOW (ref 22–32)
Calcium: 8.5 mg/dL — ABNORMAL LOW (ref 8.9–10.3)
Chloride: 101 mmol/L (ref 98–111)
Creatinine, Ser: 0.73 mg/dL (ref 0.44–1.00)
GFR calc Af Amer: >60 mL/min (ref >60)
GFR calc non Af Amer: >60 mL/min (ref >60)
Glucose, Bld: 157 mg/dL — ABNORMAL HIGH (ref 70–99)
Potassium: 3.7 mmol/L (ref 3.5–5.1)
Sodium: 136 mmol/L (ref 135–145)
Total Bilirubin: 0.5 mg/dL (ref 0.3–1.2)
Total Protein: 7.1 g/dL (ref 6.5–8.1)

## 2020-03-17 LAB — CBC
HCT: 46.2 % — ABNORMAL HIGH (ref 36.0–46.0)
Hemoglobin: 15.1 g/dL — ABNORMAL HIGH (ref 12.0–15.0)
MCH: 30 pg (ref 26.0–34.0)
MCHC: 32.7 g/dL (ref 30.0–36.0)
MCV: 91.8 fL (ref 80.0–100.0)
Platelets: 213 10*3/uL (ref 150–400)
RBC: 5.03 MIL/uL (ref 3.87–5.11)
RDW: 15.5 % (ref 11.5–15.5)
WBC: 7.7 10*3/uL (ref 4.0–10.5)
nRBC: 0 % (ref 0.0–0.2)

## 2020-03-17 LAB — POC SARS CORONAVIRUS 2 AG -  ED: SARS Coronavirus 2 Ag: NEGATIVE

## 2020-03-17 LAB — LIPASE, BLOOD: Lipase: 49 U/L (ref 11–51)

## 2020-03-17 NOTE — ED Triage Notes (Signed)
Pt reports nausea, vomiting, diarrhea, rib pain, SOB, back pain, and generalized body aches x 3 days.

## 2020-03-17 NOTE — ED Notes (Signed)
No answer for triage.

## 2020-03-18 ENCOUNTER — Emergency Department (HOSPITAL_COMMUNITY): Payer: Medicaid Other

## 2020-03-18 MED ORDER — IOHEXOL 300 MG/ML  SOLN
100.0000 mL | Freq: Once | INTRAMUSCULAR | Status: AC | PRN
  Administered 2020-03-18: 100 mL via INTRAVENOUS

## 2020-03-18 MED ORDER — LACTATED RINGERS IV BOLUS
500.0000 mL | Freq: Once | INTRAVENOUS | Status: AC
  Administered 2020-03-18: 500 mL via INTRAVENOUS

## 2020-03-18 MED ORDER — LOPERAMIDE HCL 2 MG PO TABS: 2.0000 mg | ORAL_TABLET | Freq: Four times a day (QID) | ORAL | 0 refills | Status: AC | PRN

## 2020-03-18 NOTE — ED Provider Notes (Signed)
[facility] Provider Note   CSN: [id number] Arrival date & time: 03/17/20  1603     History Chief Complaint  Patient presents with  . Emesis  . Diarrhea  . Shortness of Breath    Rachael Stone is a 54 y.o. female.  Rachael Stone is a 54 yr old female with PMH of HTN, cardiomyopathy,aortic insufficiency and ETOH abuse presents with 1 week history of diarrhea, reduced appetite, nausea, cold sweats and subjective fevers.  Patient reports diarrhea 20 times a day. Described as " watery with bits" and color limegreen to also black in color.  She has also had episodes of diarrhea while in the ED.  Denies rectal bleeding, vomiting or hematemesis. Has felt nauseous.  Denies eating anything unusual/take out prior to her symptoms starting. Patient also endorses intermittent left-sided chest pains which she thinks are related to anxiety.  Endorses a dry cough and  she also a intermittent shortness of breath.  Also endorses 20 pound unintentional weight loss in the last 2 months.  She endorses trouble swallowing and has not been able to swallow solid foods over the last few weeks.  She has been eating applesauce, fruit cups and Ensure drinks as they are been easier to swallow.  Has not seen her primary care about this.  She last took her medications on Friday (2 days ago). Denies sick contacts. Denies covid contacts. Denies recent antibiotic use.  Patient was fired from her job 2 months ago.  Since then has increased alcohol intake to 4-6 beers a day.  Denies IVDU but does smoke marijuana.  Lives with fiance and is independent with ADLs.  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        Past Medical History:  Diagnosis Date  . Back pain   . Cardiomyopathy ([place])    a. 04/08/2016 Echo: EF 35%, diff HK, mild LVH, Gr2 DD, mod AI, mod to sev MR, mildly dil LA.  [name] DEPRESSIVE DISORDER NOT ELSEWHERE CLASSIFIED 12/14/2008   Qualifier:  Diagnosis of  By: [name], Junie Panning    . DYSFUNCTIONAL UTERINE BLEEDING 11/12/2007   Qualifier: Diagnosis of  By: [name], Junie Panning    . ETOH abuse   . Hypertensive heart disease with heart failure ([place])   . Marijuana abuse   . MENORRHAGIA 12/14/2008   Qualifier: Diagnosis of  By: [name], Junie Panning    . Moderate aortic insufficiency    a. 04/08/2016 Echo: mod AI.  [name] Moderate to Severe Mitral Regurgitation    a. 04/08/2016 Echo: mod-sev MR directed centrally.  . Tobacco abuse     Patient Active Problem List   Diagnosis Date Noted  . Alcoholism ([place])   . Hypokalemia   . Hospital discharge follow-up   . BV (bacterial vaginosis) 01/26/2017  . Trichomonal infection 01/26/2017  . Essential hypertension 11/14/2016  . Normal coronary arteries 04/11/2016  . Hypertensive heart disease with heart failure ([place])   . NICM (nonischemic cardiomyopathy) ([place])   . Nonrheumatic aortic valve insufficiency   . Moderate to Severe Mitral Regurgitation   . Tobacco abuse   . Acute pulmonary edema ([place]) 04/08/2016  . Elevated blood pressure 04/08/2016  . Dyspnea   . Acute combined systolic and diastolic heart failure ([place]) 04/07/2016  . VAGINAL DISCHARGE 09/10/2009  . CHEST PAIN UNSPECIFIED 08/13/2009  . DEPRESSIVE DISORDER NOT ELSEWHERE CLASSIFIED 12/14/2008  . MENORRHAGIA 12/14/2008  . NAUSEA, CHRONIC 12/14/2008  . ELEVATED BLOOD  PRESSURE WITHOUT DIAGNOSIS OF HYPERTENSION 12/14/2008  . DYSFUNCTIONAL UTERINE BLEEDING 11/12/2007  . TOBACCO DEPENDENCE 09/10/2006  . GASTROESOPHAGEAL REFLUX, NO ESOPHAGITIS 09/10/2006    Past Surgical History:  Procedure Laterality Date  . CARDIAC CATHETERIZATION N/A 04/10/2016   Procedure: Right/Left Heart Cath and Coronary Angiography;  Surgeon: [name], MD;  Location: [facility];  Service: Cardiovascular;  Laterality: N/A;  . EXPLORATORY LAPAROTOMY  2003   Ectopic Pregnancy - unsure which side or if tube was removed  . TEE WITHOUT CARDIOVERSION N/A  06/26/2017   Procedure: TRANSESOPHAGEAL ECHOCARDIOGRAM (TEE);  Surgeon: Acie Fredrickson, Wonda Cheng, MD;  Location: [facility] ENDOSCOPY;  Service: Cardiovascular;  Laterality: N/A;  . TUBAL LIGATION       OB History    Gravida  3   Para  2   Term  0   Preterm  0   AB  1   Living  2     SAB  0   TAB  0   Ectopic  1   Multiple  0   Live Births              Family History  Problem Relation Age of Onset  . CAD Mother        First MI @ 100 - 71 total.  . Cirrhosis Father        alcoholic - died in his 69'C.  . Lupus Sister     Social History   Tobacco Use  . Smoking status: Current Every Day Smoker    Packs/day: 0.50    Years: 15.00    Pack years: 7.50    Types: Cigarettes  . Smokeless tobacco: Never Used  Vaping Use  . Vaping Use: Never used  Substance Use Topics  . Alcohol use: Yes    Comment: At least 12 ounces of homemade moon shine every other day.  . Drug use: Yes    Types: Marijuana    Comment: Smokes marijuana daily.  Used to smoke cocaine laced  marijuana cigarettes for 15-20 yrs but quit cocaine 6 yrs ago.    Home Medications Prior to Admission medications   Medication Sig Start Date End Date Taking? Authorizing Provider  amLODipine (NORVASC) 5 MG tablet Take 1 tablet (5 mg total) by mouth daily. 03/14/20  Yes Nahser, Wonda Cheng, MD  atorvastatin (LIPITOR) 40 MG tablet Take 1 tablet (40 mg total) by mouth daily. 04/25/19  Yes Kerin Perna, NP  carvedilol (COREG) 6.25 MG tablet Take 1 tablet (6.25 mg total) by mouth 2 (two) times daily. 10/25/19  Yes Nahser, Wonda Cheng, MD  losartan (COZAAR) 50 MG tablet Take 1 tablet (50 mg total) by mouth daily. 03/14/20  Yes Nahser, Wonda Cheng, MD  potassium chloride SA (KLOR-CON) 20 MEQ tablet Take 1 tablet (20 mEq total) by mouth daily. 03/14/20  Yes Nahser, Wonda Cheng, MD  spironolactone (ALDACTONE) 25 MG tablet Take 1 tablet (25 mg total) by mouth daily. 03/14/20  Yes Nahser, Wonda Cheng, MD    Allergies    Ibuprofen  Review of  Systems   Review of Systems  Constitutional: Positive for appetite change, chills, fever and unexpected weight change. Negative for activity change.  HENT: Positive for postnasal drip, rhinorrhea and trouble swallowing. Negative for ear discharge, ear pain and sore throat.   Eyes: Negative for visual disturbance.  Respiratory: Positive for cough and chest tightness. Negative for shortness of breath.   Cardiovascular: Negative for chest pain, palpitations and leg swelling.  Gastrointestinal: Positive for diarrhea and nausea. Negative for abdominal pain, blood in stool, constipation and vomiting.  Genitourinary: Negative for dysuria, frequency, hematuria and vaginal bleeding.  Musculoskeletal: Positive for myalgias.  Neurological: Positive for numbness. Negative for dizziness and headaches.    Physical Exam Updated Vital Signs BP 135/81   Pulse 78   Temp 98.2 F (36.8 C) (Oral)   Resp 17   Wt 59 kg   SpO2 98%   BMI 21.63 kg/m   Physical Exam Constitutional:      General: She is awake. She is not in acute distress.    Appearance: She is not ill-appearing.  HENT:     Head: Normocephalic and atraumatic.     Mouth/Throat:     Mouth: Mucous membranes are moist.     Pharynx: Oropharynx is clear.  Eyes:     Extraocular Movements: Extraocular movements intact.     Pupils: Pupils are equal, round, and reactive to light.  Cardiovascular:     Rate and Rhythm: Normal rate and regular rhythm.     Heart sounds: Normal heart sounds, S1 normal and S2 normal.  Pulmonary:     Effort: Pulmonary effort is normal.     Breath sounds: No decreased breath sounds.  Chest:     Chest wall: No mass.  Abdominal:     General: Bowel sounds are normal.     Palpations: Abdomen is soft.     Tenderness: There is no guarding.  Musculoskeletal:     Right lower leg: No tenderness. No edema.     Left lower leg: No tenderness. No edema.  Skin:    General: Skin is warm and dry.  Neurological:      General: No focal deficit present.     Mental Status: She is alert.     GCS: GCS eye subscore is 4. GCS verbal subscore is 5. GCS motor subscore is 6.     Cranial Nerves: No cranial nerve deficit.     Sensory: Sensation is intact.     Motor: Motor function is intact.  Psychiatric:        Behavior: Behavior is cooperative.     ED Results / Procedures / Treatments    Labs (all labs ordered are listed, but only abnormal results are displayed) Labs Reviewed  COMPREHENSIVE METABOLIC PANEL - Abnormal; Notable for the following components:      Result Value   CO2 21 (*)    Glucose, Bld 157 (*)    BUN <5 (*)    Calcium 8.5 (*)    AST 58 (*)    All other components within normal limits  CBC - Abnormal; Notable for the following components:   Hemoglobin 15.1 (*)    HCT 46.2 (*)    All other components within normal limits  URINALYSIS, ROUTINE W REFLEX MICROSCOPIC - Abnormal; Notable for the following components:   Color, Urine AMBER (*)    APPearance HAZY (*)    Protein, ur 30 (*)    Leukocytes,Ua TRACE (*)    Bacteria, UA RARE (*)    All other components within normal limits  SARS CORONAVIRUS 2 BY RT PCR [facility] ORDER, [place] LAB)  LIPASE, BLOOD  POC SARS CORONAVIRUS 2 AG -  ED    EKG EKG Interpretation  Date/Time:  Saturday March 17 2020 18:44:26 EDT Ventricular Rate:  85 PR Interval:  116 QRS Duration: 84 QT Interval:  392 QTC Calculation: 466 R Axis:   37  Text Interpretation: Normal sinus rhythm Normal ECG No significant change since last tracing Confirmed by Blanchie Dessert [phone]) on 03/18/2020 7:23:42 AM   Radiology DG Chest Portable 1 View  Result Date: 03/17/2020 CLINICAL DATA:  Dyspnea, left chest pain EXAM: PORTABLE CHEST 1 VIEW COMPARISON:  10/13/2019 FINDINGS: The heart size and mediastinal contours are within normal limits. Both lungs are clear. The visualized skeletal structures are unremarkable. IMPRESSION: No active  disease. Electronically Signed   By: Fidela Salisbury MD   On: 03/17/2020 18:33    Procedures Procedures (including critical care time)  Medications Ordered in ED Medications  lactated ringers bolus 500 mL (500 mLs Intravenous New Bag/Given 03/18/20 4917)    ED Course  I have reviewed the triage vital signs and the nursing notes.  Pertinent labs & imaging results that were available during my care of the patient were reviewed by me and considered in my medical decision making (see chart for details).    MDM Rules/Calculators/A&P                         Rachael Stone is a 54 year old female with PMH of HTN, cardiomyopathy,aortic insufficiency and ETOH abuse who presents to ED for 1 week history of diarrhea, nausea reduced appetite and subjective fevers. She has also had a early satiety, difficulty swallowing and 20lb weight loss for the past several months.  Labs: Hb 15, HCT 46, AST 58, glucose 157, UA: protein 30, amber, trace leuks. UDS THC. EKG: SR, no ischemic changes, CXR wnl. GIPP collected. CT abdomen pelvis showed no acute abdominal or pelvic findings.  Diagnosis likely consistent with gastroenteritis. Recommend follow up with GI on discharge for EGD. Referral has been placed. Patient expressed understanding. Safety return precautions given to patient.   Final Clinical Impression(s) / ED Diagnoses Final diagnoses:  None    Rx / DC Orders ED Discharge Orders    None       Lattie Haw, MD 03/18/20 1100    Blanchie Dessert, MD 03/18/20 1408

## 2020-03-18 NOTE — Discharge Instructions (Addendum)
Rachael Stone, it was great to meet you today! You were admitted with 1 week history of diarrhea and reduced appetite.  The labs and CT scan that we did were all normal which is reassuring.  Given that you have had swallowing issues and feeling full we recommend a camera test in the food pipe. We would recommend that you are followed up by GI doctors on discharge.(We will place this referral for you).  They will contact you in the next few weeks.   You can take Imodium for the diarrhea.   We would recommend that you switch your primary care provider if you feel necessary.  You can contact [facility] and see if they are taking new patients. Tel: [phone]

## 2020-03-19 LAB — GASTROINTESTINAL PANEL BY PCR, STOOL (REPLACES STOOL CULTURE)

## 2020-03-21 ENCOUNTER — Other Ambulatory Visit: Payer: Medicaid Other

## 2020-03-21 ENCOUNTER — Ambulatory Visit (INDEPENDENT_AMBULATORY_CARE_PROVIDER_SITE_OTHER): Payer: Self-pay | Admitting: Gastroenterology

## 2020-03-21 ENCOUNTER — Telehealth: Payer: Self-pay | Admitting: Cardiovascular Disease

## 2020-03-21 ENCOUNTER — Encounter: Payer: Self-pay | Admitting: Gastroenterology

## 2020-03-21 DIAGNOSIS — R197 Diarrhea, unspecified: Secondary | ICD-10-CM

## 2020-03-21 DIAGNOSIS — R1310 Dysphagia, unspecified: Secondary | ICD-10-CM

## 2020-03-21 DIAGNOSIS — R630 Anorexia: Secondary | ICD-10-CM

## 2020-03-21 DIAGNOSIS — R634 Abnormal weight loss: Secondary | ICD-10-CM

## 2020-03-21 MED FILL — SPIRONOLACTONE 25 MG TABLET: 25 | 30 days supply | Qty: 30 | Fill #0

## 2020-03-21 MED FILL — POTASSIUM CL ER 20 MEQ TAB: 20 | 30 days supply | Qty: 30 | Fill #0

## 2020-03-21 MED FILL — AMLODIPINE BESYLATE 5 MG TA: 5 | 30 days supply | Qty: 30 | Fill #0

## 2020-03-21 MED FILL — LOSARTAN POTASSIUM 50 MG TA: 50 | 30 days supply | Qty: 30 | Fill #0

## 2020-03-21 NOTE — Telephone Encounter (Signed)
Patient states that her blood pressure was 94/50 this morning. She denies any symptoms. She states that she did not take her BP medications and she wanted to make sure that was okay. I advised her that it was okay to hold if BP is running that low. She has not rechecked her BP since. I advised her to do so and if normal/elevated she can take medications.  She states that she only takes her blood pressure about 2 days a week so she is not sure if it typically runs that low. Advised her to take her BP daily and let us know if it is consistently low or if she becomes symptomatic.

## 2020-03-21 NOTE — Telephone Encounter (Signed)
It is ok for her to hold her BP / CHF meds if BP is low.  She likely needs to come in for an appt soon to see me or an APP

## 2020-03-21 NOTE — Patient Instructions (Signed)
If you are age 52 or older, your body mass index should be between 23-30. Your Body mass index is 22 kg/m. If this is out of the aforementioned range listed, please consider follow up with your Primary Care Provider.  If you are age 95 or younger, your body mass index should be between 19-25. Your Body mass index is 22 kg/m. If this is out of the aformentioned range listed, please consider follow up with your Primary Care Provider.   Your provider has requested that you go to the basement level for lab work before leaving today. Press "B" on the elevator. The lab is located at the first door on the left as you exit the elevator.  Due to recent changes in healthcare laws, you may see the results of your imaging and laboratory studies on MyChart before your provider has had a chance to review them.  We understand that in some cases there may be results that are confusing or concerning to you. Not all laboratory results come back in the same time frame and the provider may be waiting for multiple results in order to interpret others.  Please give [place] 48 hours in order for your provider to thoroughly review all the results before contacting the office for clarification of your results.   We will contact you with your lab work and discuss further plans at that time.   Thank you for entrusting me with your care and choosing [facility].  Dr Ardis Hughs

## 2020-03-21 NOTE — Telephone Encounter (Signed)
Pt c/o BP issue: STAT if pt c/o blurred vision, one-sided weakness or slurred speech  1. What are your last 5 BP readings? 94/50  2. Are you having any other symptoms (ex. Dizziness, headache, blurred vision, passed out)? no  3. What is your BP issue? Patient states her BP was low today and she would like to know if she should still take her BP medication.

## 2020-03-21 NOTE — Progress Notes (Signed)
HPI: This is a very pleasant 54 year old woman who was referred by the emergency room after recent visit this past weekend.  She has had about 2 to 3 months of significant GI symptoms.  She has lost 25 pounds over the interval.  She has had early satiety for years and more recently she described this as "do not want to swallow" she is not having significant solid or liquid food dysphagia.  She just tells me that when she swallows all the food seems to go down out of her mouth.  She will often chew food until it is dry and then spit it out and let her dog up the rest.  She admits she has poorly fitting dentures.  For the past 2 to 3 weeks she has had significant watery nonbloody diarrhea including nocturnal episodes.  She is having intermittent abdominal pains.  She has alcohol overuse including about 6 drinks a day with beer and hard alcohol.  She does not take NSAIDs.  She does intermittently take Tylenol.   Old Data Reviewed: Blood work September 2021 was essentially normal except for an AST of 58.  CBC was normal except for hemoglobin that was slightly elevated at 15.1 GI pathogen panel was completely negative  CT scan abdomen pelvis with IV and oral contrast September 2021 indication "nausea, vomiting, diarrhea, rib pain, short of breath, back pain, generalized body aches and unintended weight loss" impression essentially normal.    Review of systems: Pertinent positive and negative review of systems were noted in the above HPI section. All other review negative.   Past Medical History:  Diagnosis Date  . Back pain   . Cardiomyopathy ([place])    a. 04/08/2016 Echo: EF 35%, diff HK, mild LVH, Gr2 DD, mod AI, mod to sev MR, mildly dil LA.  [name] DEPRESSIVE DISORDER NOT ELSEWHERE CLASSIFIED 12/14/2008   Qualifier: Diagnosis of  By: [name], Junie Panning    . DYSFUNCTIONAL UTERINE BLEEDING 11/12/2007   Qualifier: Diagnosis of  By: [name], Junie Panning    . ETOH abuse   . Hypertensive heart disease with  heart failure ([place])   . Marijuana abuse   . MENORRHAGIA 12/14/2008   Qualifier: Diagnosis of  By: [name], Junie Panning    . Moderate aortic insufficiency    a. 04/08/2016 Echo: mod AI.  [name] Moderate to Severe Mitral Regurgitation    a. 04/08/2016 Echo: mod-sev MR directed centrally.  . Tobacco abuse     Past Surgical History:  Procedure Laterality Date  . CARDIAC CATHETERIZATION N/A 04/10/2016   Procedure: Right/Left Heart Cath and Coronary Angiography;  Surgeon: [name], MD;  Location: [facility];  Service: Cardiovascular;  Laterality: N/A;  . EXPLORATORY LAPAROTOMY  2003   Ectopic Pregnancy - unsure which side or if tube was removed  . TEE WITHOUT CARDIOVERSION N/A 06/26/2017   Procedure: TRANSESOPHAGEAL ECHOCARDIOGRAM (TEE);  Surgeon: [name], MD;  Location: [facility] ENDOSCOPY;  Service: Cardiovascular;  Laterality: N/A;  . TUBAL LIGATION      Current Outpatient Medications  Medication Sig Dispense Refill  . amLODipine (NORVASC) 5 MG tablet Take 1 tablet (5 mg total) by mouth daily. 90 tablet 3  . atorvastatin (LIPITOR) 40 MG tablet Take 1 tablet (40 mg total) by mouth daily. 30 tablet 3  . carvedilol (COREG) 6.25 MG tablet Take 1 tablet (6.25 mg total) by mouth 2 (two) times daily. 180 tablet 3  . losartan (COZAAR) 50 MG tablet Take 1 tablet (50  mg total) by mouth daily. 90 tablet 3  . potassium chloride SA (KLOR-CON) 20 MEQ tablet Take 1 tablet (20 mEq total) by mouth daily. 90 tablet 3  . spironolactone (ALDACTONE) 25 MG tablet Take 1 tablet (25 mg total) by mouth daily. 90 tablet 3  . loperamide (IMODIUM A-D) 2 MG tablet Take 1 tablet (2 mg total) by mouth 4 (four) times daily as needed for up to 14 days for diarrhea or loose stools. (Patient not taking: Reported on 03/21/2020) 30 tablet 0   No current facility-administered medications for this visit.    Allergies as of 03/21/2020 - Review Complete 03/21/2020  Allergen Reaction Noted  . Ibuprofen Other (See  Comments) 04/10/2013    Family History  Problem Relation Age of Onset  . CAD Mother        First MI @ 2 - 87 total.  . Cirrhosis Father        alcoholic - died in his 35'T.  . Lupus Sister   . Colon cancer Neg Hx   . Colon polyps Neg Hx     Social History   Socioeconomic History  . Marital status: Single    Spouse name: Not on file  . Number of children: Not on file  . Years of education: Not on file  . Highest education level: Not on file  Occupational History  . Occupation: Cook  Tobacco Use  . Smoking status: Current Every Day Smoker    Packs/day: 0.50    Years: 15.00    Pack years: 7.50    Types: Cigarettes  . Smokeless tobacco: Never Used  Vaping Use  . Vaping Use: Never used  Substance and Sexual Activity  . Alcohol use: Yes    Comment: At least 12 ounces of homemade moon shine every other day.  . Drug use: Yes    Types: Marijuana    Comment: Smokes marijuana daily.  Used to smoke cocaine laced  marijuana cigarettes for 15-20 yrs but quit cocaine 6 yrs ago.  [name] Sexual activity: Yes    Birth control/protection: Other-see comments    Comment: BTL  Other Topics Concern  . Not on file  Social History Narrative   Lives in [place] with her fiance.  Does not routinely exercise.   Social Determinants of Health   Financial Resource Strain:   . Difficulty of Paying Living Expenses: Not on file  Food Insecurity:   . Worried About Charity fundraiser in the Last Year: Not on file  . Ran Out of Food in the Last Year: Not on file  Transportation Needs:   . Lack of Transportation (Medical): Not on file  . Lack of Transportation (Non-Medical): Not on file  Physical Activity:   . Days of Exercise per Week: Not on file  . Minutes of Exercise per Session: Not on file  Stress:   . Feeling of Stress : Not on file  Social Connections:   . Frequency of Communication with Friends and Family: Not on file  . Frequency of Social Gatherings with Friends and Family: Not on file   . Attends Religious Services: Not on file  . Active Member of Clubs or Organizations: Not on file  . Attends Archivist Meetings: Not on file  . Marital Status: Not on file  Intimate Partner Violence:   . Fear of Current or Ex-Partner: Not on file  . Emotionally Abused: Not on file  . Physically Abused: Not on file  . Sexually Abused:  Not on file     Physical Exam: BP (!) 96/56   Pulse 100   Ht _0  (1.651 m)   Wt 132 lb 3.2 oz (60 kg)   BMI 22.00 kg/m  Constitutional: generally well-appearing Psychiatric: alert and oriented x3 Eyes: extraocular movements intact Mouth: oral pharynx moist, no lesions Neck: supple no lymphadenopathy Cardiovascular: heart regular rate and rhythm Lungs: clear to auscultation bilaterally Abdomen: soft, nontender, nondistended, no obvious ascites, no peritoneal signs, normal bowel sounds Extremities: no lower extremity edema bilaterally Skin: no lesions on visible extremities   Assessment and plan: 54 y.o. female with early satiety, inability to eat well, abdominal discomfort, unintentional weight loss, diarrhea  She has myriad of upper and lower GI symptoms.  I think she is going to require invasive GI testing including upper endoscopy and probably colonoscopy.  It is too bad that her GI stool pathogen panel this past weekend did not include C. difficile testing.  She has not been on antibiotics recently but certainly we should rule that out before going ahead with any other testing.  She will have C. difficile testing now and after that result is back we will likely arrange upper endoscopy plus minus colonoscopy to work-up the above symptoms.  I do have some concern for possible underlying neoplasm especially given her weight loss   Please see the "Patient Instructions" section for addition details about the plan.   Owens Loffler, MD [facility] 03/21/2020, 10:20 AM  Cc: Kerin Perna, NP  Total time on date of  encounter was 45  minutes (this included time spent preparing to see the patient reviewing records; obtaining and/or reviewing separately obtained history; performing a medically appropriate exam and/or evaluation; counseling and educating the patient and family if present; ordering medications, tests or procedures if applicable; and documenting clinical information in the health record).

## 2020-03-22 ENCOUNTER — Other Ambulatory Visit: Payer: Medicaid Other

## 2020-03-22 DIAGNOSIS — R634 Abnormal weight loss: Secondary | ICD-10-CM

## 2020-03-22 DIAGNOSIS — R1310 Dysphagia, unspecified: Secondary | ICD-10-CM

## 2020-03-22 DIAGNOSIS — R630 Anorexia: Secondary | ICD-10-CM

## 2020-03-22 DIAGNOSIS — R197 Diarrhea, unspecified: Secondary | ICD-10-CM

## 2020-03-22 NOTE — Telephone Encounter (Signed)
Left message for patient to call back  

## 2020-03-23 ENCOUNTER — Other Ambulatory Visit: Payer: Self-pay

## 2020-03-23 MED ORDER — VANCOMYCIN HCL 125 MG PO CAPS: 125.0000 mg | ORAL_CAPSULE | Freq: Four times a day (QID) | ORAL | 0 refills | Status: DC

## 2020-03-23 MED FILL — ?VANCOMYCIN HCL 125 MG CAPS: 125 | 14 days supply | Qty: 56 | Fill #0

## 2020-03-26 LAB — CLOSTRIDIUM DIFFICILE TOXIN B, QUALITATIVE, REAL-TIME PCR: Toxigenic C. Difficile by PCR: DETECTED — AB

## 2020-03-27 NOTE — Telephone Encounter (Signed)
Rachael Stone was recently hospitalized with gastroenteritis after having diarrhea for 10 days. Her most recent BP was 110/60 and she is nervous to take her BP medications although she is feeling better. She has not taken her medications since last week. Instructed the patient to stay hydrated. Scheduled her for follow-up with Dr. Acie Fredrickson this upcoming Monday. She will keep a BP log and bring to her appointment. She was grateful for follow-up.

## 2020-04-03 ENCOUNTER — Encounter: Payer: Self-pay | Admitting: Cardiovascular Disease

## 2020-04-03 ENCOUNTER — Other Ambulatory Visit: Payer: Self-pay

## 2020-04-03 ENCOUNTER — Ambulatory Visit (INDEPENDENT_AMBULATORY_CARE_PROVIDER_SITE_OTHER): Payer: Medicaid Other | Admitting: Cardiovascular Disease

## 2020-04-03 VITALS — BP 112/58 | HR 102 | Ht 65.5 in | Wt 130.6 lb

## 2020-04-03 DIAGNOSIS — I340 Nonrheumatic mitral (valve) insufficiency: Secondary | ICD-10-CM

## 2020-04-03 DIAGNOSIS — I10 Essential (primary) hypertension: Secondary | ICD-10-CM

## 2020-04-03 DIAGNOSIS — I428 Other cardiomyopathies: Secondary | ICD-10-CM

## 2020-04-03 NOTE — Progress Notes (Signed)
Cardiology Office Note   Date:  04/03/2020   ID:  [name], DOB [birth year], MRN [id number]  PCP:  Kerin Perna, NP  Cardiologist:  Dr. Acie Fredrickson    Chief Complaint  Patient presents with  . Congestive Heart Failure  . Hypertension  . Aortic Insuffiency      Notes from Rachael Stone: Rachael Stone is a 54 y.o. female who presents for post hospital for progressive dyspnea and orthopnea and has been found to have LV dysfunction (EF 35%), mod AI, and mod - sev MR.  She has a  h/o tob, alcohol, marijuana, and remote cocaine abuse.  She has no prior cardiac hx.  She had CTA of chest without PE + pulmonary edema and LUL nodule of 74mm.  Echo with EF 30% and mod-sev MR and mod AI. Rachael Stone    She had R & L cardiac cath   LV end diastolic pressure is normal.  There is moderate left ventricular systolic dysfunction.  The left ventricular ejection fraction is 35% by visual estimate.  There is no aortic valve stenosis.  LV end diastolic pressure is normal  Normal coronary arteries.  She has done well no further SOB, swelling, which was in her abd or chest pain.  She has decreased cigarettes to 5 per day down from 10 per day.  Could not afford nicotene patches.  She has cut out salt in her diet.  Unfortunately she has not taken her meds for 2 days, she is worried they will hurt her heart.  We had a long discussion on what happened to her heart and the weakness of her ventricle. We discussed how important the meds were to improve her heart.     Feb.   2, 2018: Seen with her sister, Rachael Stone.   She stopped her meds when they ran out in Dec.   Says she felt better off the meds.   She says that she does not have any symptoms.   Does not want to restart meds.   Saw Rachael Stone who refilled her meds.  Patient did not Get her medications filled. She states that she does not understand why she needs to take these medications and does not want to take medications or her life.  Nov 14, 2016:  [name] is seen today with her sister, Rachael Stone.  No CP or dyspnea.    Donates plasma twice a week.   BP is typically normal there  Dec. 6,2018:  Rachael Stone had an echocardiogram recently that revealed worsening systolic congestive heart failure and moderate to severe aortic insufficiency. Seen with her sister , Rachael Stone  No real increase in dyspnea.   September 11, 2017:   Rachael Stone is seen by her self today . Seems to be doing well   Has mild dyspnea.   Had a TEE  Her ejection fraction is now normal with an EF of 55-60%.  She has moderate aortic insufficiency, mild mitral regurgitation Her left ventricular function has improved and her aortic insufficiency and mitral regurgitation have both improved as well since her previous echo.  Complains about having to urinate so much with the lasix   Dec. 8 2020 :  Rachael Stone is seen today for follow-up of her congestive heart failure, mild mitral vegetation, hyperlipidemia.  She also has mild to moderate aortic insufficiency. Avoids salty foods.   January 13, 2020;   Rachael Stone is seen today  Echo recently showed normal LV size and function Moderate -severe AI  No  real symptoms.   Not much exercise   April 03, 2020: Rachael Stone  is seen today for follow-up of her congestive heart failure, BP has been low Has a poor appetite,  Has had diarrhea for past week or 2.  Has seen GI.  Has submitted stool samples.   Breathing has been ok  Wt today is 130 lbs.  ( 19 lb weight loss in 9 months )  Drinks 4 beers + 2 shots of whiskey per night      Past Medical History:  Diagnosis Date  . Back pain   . Cardiomyopathy ([place])    a. 04/08/2016 Echo: EF 35%, diff HK, mild LVH, Gr2 DD, mod AI, mod to sev MR, mildly dil LA.  Rachael Stone DEPRESSIVE DISORDER NOT ELSEWHERE CLASSIFIED 12/14/2008   Qualifier: Diagnosis of  By: [name], Junie Panning    . DYSFUNCTIONAL UTERINE BLEEDING 11/12/2007   Qualifier: Diagnosis of  By: [name], Junie Panning    . ETOH abuse   . Hypertensive heart  disease with heart failure ([place])   . Marijuana abuse   . MENORRHAGIA 12/14/2008   Qualifier: Diagnosis of  By: [name], Junie Panning    . Moderate aortic insufficiency    a. 04/08/2016 Echo: mod AI.  Rachael Stone Moderate to Severe Mitral Regurgitation    a. 04/08/2016 Echo: mod-sev MR directed centrally.  . Tobacco abuse     Past Surgical History:  Procedure Laterality Date  . CARDIAC CATHETERIZATION N/A 04/10/2016   Procedure: Right/Left Heart Cath and Coronary Angiography;  Surgeon: [name], MD;  Location: [facility];  Service: Cardiovascular;  Laterality: N/A;  . EXPLORATORY LAPAROTOMY  2003   Ectopic Pregnancy - unsure which side or if tube was removed  . TEE WITHOUT CARDIOVERSION N/A 06/26/2017   Procedure: TRANSESOPHAGEAL ECHOCARDIOGRAM (TEE);  Surgeon: [name], MD;  Location[facility];  Service: Cardiovascular;  Laterality: N/A;  . TUBAL LIGATION       Current Outpatient Medications  Medication Sig Dispense Refill  . amLODipine (NORVASC) 5 MG tablet Take 1 tablet (5 mg total) by mouth daily. 90 tablet 3  . atorvastatin (LIPITOR) 40 MG tablet Take 1 tablet (40 mg total) by mouth daily. 30 tablet 3  . carvedilol (COREG) 6.25 MG tablet Take 1 tablet (6.25 mg total) by mouth 2 (two) times daily. 180 tablet 3  . losartan (COZAAR) 50 MG tablet Take 1 tablet (50 mg total) by mouth daily. 90 tablet 3  . potassium chloride SA (KLOR-CON) 20 MEQ tablet Take 1 tablet (20 mEq total) by mouth daily. 90 tablet 3  . spironolactone (ALDACTONE) 25 MG tablet Take 1 tablet (25 mg total) by mouth daily. 90 tablet 3   No current facility-administered medications for this visit.    Allergies:   Ibuprofen    Social History:  The patient  reports that she has been smoking cigarettes. She has a 7.50 pack-year smoking history. She has never used smokeless tobacco. She reports current alcohol use. She reports current drug use. Drug: Marijuana.   Family History:  The patient's family  history includes CAD in her mother; Cirrhosis in her father; Lupus in her sister.    ROS   - negative   Wt Readings from Last 3 Encounters:  04/03/20 130 lb 9.6 oz (59.2 kg)  03/21/20 132 lb 3.2 oz (60 kg)  03/17/20 130 lb (59 kg)      Physical Exam: Blood pressure (!) 112/58, pulse (!) 102, height 5' 5.5" (1.664  m), weight 130 lb 9.6 oz (59.2 kg), SpO2 96 %.  GEN:  Thin , chronically ill appearing female,   HEENT: Normal NECK: No JVD; No carotid bruits LYMPHATICS: No lymphadenopathy CARDIAC: RRR ,   2/6 diastolic murmur,  Soft systolic murmur  RESPIRATORY:  Clear to auscultation without rales, wheezing or rhonchi  ABDOMEN: Soft, non-tender, non-distended MUSCULOSKELETAL:  No edema; No deformity  SKIN: Warm and dry NEUROLOGIC:  Alert and oriented x 3   EKG:      Recent Labs: 10/13/2019: B Natriuretic Peptide 11.7 03/17/2020: ALT 37; BUN <5; Creatinine, Ser 0.73; Hemoglobin 15.1; Platelets 213; Potassium 3.7; Sodium 136    Lipid Panel    Component Value Date/Time   CHOL 162 04/25/2019 1045   TRIG 133 04/25/2019 1045   HDL 52 04/25/2019 1045   CHOLHDL 3.1 04/25/2019 1045   CHOLHDL 4.0 Ratio 11/12/2007 2044   VLDL 32 11/12/2007 2044   LDLCALC 87 04/25/2019 1045       Other studies Reviewed: Additional studies/ records that were reviewed today include: .  Cardiac Cath 04/10/16 Conclusion     LV end diastolic pressure is normal.  There is moderate left ventricular systolic dysfunction.  The left ventricular ejection fraction is 35% by visual estimate.  There is no aortic valve stenosis.  LV end diastolic pressure is normal.   Nonischemic cardiomyopathy.  Continue medical therapy.   Indications   Acute combined systolic and diastolic heart failure (HCC) [I50.41 (ICD-10-CM)]  Dyspnea [R06.00 (ICD-10-CM)]   Hemo Data   Flowsheet Row Most Recent Value  Fick Cardiac Output 4.36 L/min  Fick Cardiac Output Index 2.46 (L/min)/BSA  RA A Wave 9 mmHg  RA V  Wave 6 mmHg  RA Mean 4 mmHg  RV Systolic Pressure 28 mmHg  RV Diastolic Pressure 3 mmHg  RV EDP 6 mmHg  PA Systolic Pressure 28 mmHg  PA Diastolic Pressure 13 mmHg  PA Mean 20 mmHg  PW A Wave 10 mmHg  PW V Wave 9 mmHg  PW Mean 8 mmHg  AO Systolic Pressure 976 mmHg  AO Diastolic Pressure 77 mmHg  AO Mean 99 mmHg  LV Systolic Pressure 734 mmHg  LV Diastolic Pressure 7 mmHg  LV EDP 12 mmHg  Arterial Occlusion Pressure Extended Systolic Pressure 193 mmHg  Arterial Occlusion Pressure Extended Diastolic Pressure 76 mmHg  Arterial Occlusion Pressure Extended Mean Pressure 98 mmHg  Left Ventricular Apex Extended Systolic Pressure 790 mmHg  Left Ventricular Apex Extended Diastolic Pressure 8 mmHg  Left Ventricular Apex Extended EDP Pressure 9 mmHg  QP/QS 1  TPVR Index 8.13 HRUI  TSVR Index 40.21 HRUI  PVR SVR Ratio 0.13  TPVR/TSVR Ratio    ECHO: 04/08/16 Study Conclusions  - Left ventricle: The cavity size was mildly dilated. Wall   thickness was increased in a pattern of mild LVH. The estimated   ejection fraction was 35%. Diffuse hypokinesis. Features are   consistent with a pseudonormal left ventricular filling pattern,   with concomitant abnormal relaxation and increased filling   pressure (grade 2 diastolic dysfunction). - Aortic valve: Trileaflet; mildly calcified leaflets. There was no   stenosis. There was moderate regurgitation. There was no   holodiastolic flow reversal noted in the descending thoracic   aorta. - Mitral valve: There was moderate to severe regurgitation directed   centrally. - Left atrium: The atrium was mildly dilated. - Right ventricle: The cavity size was normal. Systolic function   was normal. - Pulmonary arteries: No complete TR  doppler jet so unable to   estimate PA systolic pressure. - Systemic veins: IVC measured 1.7 cm with < 50% respirophasic   variation, suggesting RA pressure 8 mmHg.  Impressions:  - Mildly dilated LV with mild LV  hypertrophy. EF 35%, diffuse   hypokinesis. Moderate diastolic dysfunction. Moderate aortic   insufficiency with trileaflet aortic valve. Moderate to severe   mitral regurgitation. MR was central, no definite leaflet   abnormalities noted.    ASSESSMENT AND PLAN:  1.  Acute combined systolic and diastolic heart failure ([place])-   she has a really very poor appetite and has had diarrhea for the past several weeks.  Blood pressure still marginal.  At this time I do not think that she can restart her CHF medications.  We will continue to hold them for now.  We will have her return to see an APP in 3 months and will plan on restarting the medications if her blood pressure and metabolic issues are better.    2.  Hypertensive heart disease with heart failure ([place]) blood pressures under good control at present.  3.   NICM (nonischemic cardiomyopathy) ([place])   last echocardiogram performed May, 2021 reveals normal left ventricular systolic function with EF of 55 to 60%.  She has grade 1 diastolic dysfunction.  4. aortic insufficiency-she has moderate to severe aortic insufficiency.  Aortic insufficiency sounds the same.  5.   Mitral Regurgitation-      6.   Tobacco abuse.:     Advised smoking cessation.  7.   Normal coronary arteries    Mertie Moores, MD  04/03/2020 9:00 AM    [address],  [place], [place]  [address] Pager [phone] Phone: [phone]; Fax: [phone]

## 2020-04-03 NOTE — Patient Instructions (Signed)
Medication Instructions:  Your provider recommends that you continue on your current medications as directed. Please refer to the Current Medication list given to you today.   *If you need a refill on your cardiac medications before your next appointment, please call your pharmacy*  Follow-Up: Your provider recommends that you schedule a follow-up appointment in 3 months with Richardson Dopp.

## 2020-05-09 ENCOUNTER — Ambulatory Visit: Payer: Medicaid Other | Admitting: Cardiovascular Disease

## 2020-05-30 ENCOUNTER — Telehealth: Payer: Self-pay | Admitting: Cardiovascular Disease

## 2020-05-30 NOTE — Telephone Encounter (Signed)
Called and spoke to patient. She states that she has been having LE swelling for the past several days. Denies SOB or weight gain. She states she weighed 130 lbs today. The patient was off of all of her cardiac meds d/t diarrhea. She states that she was seen by GI and sent them a stool sample but never heard anything back. She states that she has not had any diarrhea in several weeks. She states that she restarted her cardiac meds yesterday on her own since she started having swelling. BP was 126/68 today and HR 116. She states that she has only been taking her carvedilol once a day. She will increase carvedilol to BID and continue to monitor BP and HR. I have also asked the patient to avoid foods that are high in salt content and elevate her legs to help with swelling. She will let us know if her Sx change or worsen before her appt with Richardson Dopp in December.

## 2020-05-30 NOTE — Telephone Encounter (Signed)
I agree with note by Drue Novel, RN

## 2020-05-30 NOTE — Telephone Encounter (Signed)
  Pt c/o swelling: STAT is pt has developed SOB within 24 hours  1) How much weight have you gained and in what time span? Pt does not weigh herself   2) If swelling, where is the swelling located? Feet and legs  3) Are you currently taking a fluid pill? no  4) Are you currently SOB? no  5) Do you have a log of your daily weights (if so, list)? no  6) Have you gained 3 pounds in a day or 5 pounds in a week? Not sure  7) Have you traveled recently? No   STAT if HR is under 50 or over 120 (normal HR is 60-100 beats per minute)  1) What is your heart rate? 116  Do you have a log of your heart rate readings (document readings)? 126/68  Do you have any other symptoms? Swelling   Pt was advised to stop taking all of her medication in September and see a GI Doctor because her medications were causing stomach issues. The patient said her pulse is up but her BP is low and she is having swelling.  She wanted to know if she should go back on her medication now or still wait until December when she sees [employer]. Please advise

## 2020-06-22 ENCOUNTER — Telehealth: Payer: Self-pay | Admitting: Cardiovascular Disease

## 2020-06-22 NOTE — Telephone Encounter (Signed)
Attempted to call the patient back with the following recommendations from Dr. Acie Fredrickson, no answer, left message for the patient to call our office back.  Nahser, Wonda Cheng, MD        She has had nausea, vomitting, diarrhea for months   , ~ 30 lb weight loss I dont think this is heart related.  She should start with her primary MD Can also set her up to see an APP in a week or so or first available

## 2020-06-22 NOTE — Telephone Encounter (Signed)
Pt aware of recommendations and verbalizes understanding the patient will keep upcoming appt with Richardson Dopp [facility] as well .Adonis Housekeeper

## 2020-06-22 NOTE — Telephone Encounter (Signed)
She has had nausea, vomitting, diarrhea for months   , ~ 30 lb weight loss I dont think this is heart related.  She should start with her primary MD Can also set her up to see an APP in a week or so or first available

## 2020-06-22 NOTE — Telephone Encounter (Signed)
Patient is returning call.  °

## 2020-06-22 NOTE — Telephone Encounter (Signed)
C/o shortness of breath for 2 weeks. No current weight but patient used to weigh ~150 now estimated in the ~120. Patient can lay flat at night, no swelling visible in legs or hands today. States her legs and hand swell with the weather but are not swollen today.   Tingling bilaterally in lower extremities and left hand. Lower extremities tingling for a couple of months and left hand just started this week. Feeling like pins and needles.   Patient states she has not taken her heart  mediations since seeing Dr. Acie Fredrickson on 9/21. Couldn't tell me which ones. From OV note: "Acute combined systolic and diastolic heart failure ([place])-   she has a really very poor appetite and has had diarrhea for the past several weeks.  Blood pressure still marginal.  At this time I do not think that she can restart her CHF medications.  We will continue to hold them for now.  We will have her return to see an APP in 3 months and will plan on restarting the medications if her blood pressure and metabolic issues are better."  ~116/83 about week ago.  Calling back when she gets home ~30 minutes with current BP.    Then will forward to Dr. Sharrell Ku.

## 2020-06-22 NOTE — Telephone Encounter (Signed)
Pt c/o Shortness Of Breath: STAT if SOB developed within the last 24 hours or pt is noticeably SOB on the phone  1. Are you currently SOB (can you hear that pt is SOB on the phone)? no  2. How long have you been experiencing SOB? 2 weeks.   3. Are you SOB when sitting or when up moving around? both  4. Are you currently experiencing any other symptoms? Tingling in left hand and legs - like pins and needles, feels numb. States this started yesterday.

## 2020-07-02 NOTE — Progress Notes (Deleted)
Cardiology Office Note:    Date:  07/02/2020   ID:  [name], DOB 1966/06/12, MRN [id number]  PCP:  Kerin Perna, NP  [facility] Cardiologist:  Mertie Moores, MD *** [facility] Electrophysiologist:  None   Referring MD: Kerin Perna, NP   Chief Complaint:  No chief complaint on file.    Patient Profile:    Rachael Stone is a 54 y.o. female with:   Heart failure with improved ejection fraction   Non-ischemic cardiomyopathy   Cath 9/17: normal coronary arteries   Echocardiogram 9/17: EF35  Echocardiogram 11/18: EF 55-60  Echocardiogram 5/21: EF 55-60  Aortic insufficiency   Echocardiogram 11/18: mod to severe AI  TEE 12/18: mod AI  Echocardiogram 5/21: mod to severe AI  Mitral regurgitation   Echocardiogram 9/17: mod to severe MR  Echocardiogram 11/18: mild MR  TEE 12/18: mild MR  Echocardiogram 5/21: trivial MR  Substance abuse (tobacco, marijuana, cocaine)  Hypertension   Hyperlipidemia   Prior CV studies: Echocardiogram 11/14/19 EF 55-60, no RWMA, Gr 1 DD, GLS -15%, normal RVSF, trivial MR, mod to severe AI, mild AV sclerosis (no AS)  TEE 06/26/17 EF 55-60, mod AI, mild MR  Echocardiogram 06/09/17 EF 55-60, Gr 2 DD, mod to severe AI, mild MR, mild LAE, normal RVSF, PASP 20  Cardiac catheterization 04/10/16 Normal coronary arteries, EF 35  Echocardiogram 04/08/16 EF 35, mild LVH, diff HK, Gr 2 DD, mod AI, mod to severe MR, mild LAE, normal RVSF  History of Present Illness:    Rachael Stone was last seen by Dr. Acie Fredrickson in 9/21.  She had had diarrhea and poor appetite.  She was off her CHF medications.  Her BP was running low.  She was kept off her medications and f/u was arranged today to hopefully restart her GDMT (prior meds: Carvedilol 6.25 twice daily, Losartan 50 once daily, spironolactone 25).  ***      Past Medical History:  Diagnosis Date  . Back pain   . Cardiomyopathy ([place])    a. 04/08/2016 Echo: EF  35%, diff HK, mild LVH, Gr2 DD, mod AI, mod to sev MR, mildly dil LA.  [name] DEPRESSIVE DISORDER NOT ELSEWHERE CLASSIFIED 12/14/2008   Qualifier: Diagnosis of  By: [name], Junie Panning    . DYSFUNCTIONAL UTERINE BLEEDING 11/12/2007   Qualifier: Diagnosis of  By: [name], Junie Panning    . ETOH abuse   . Hypertensive heart disease with heart failure ([place])   . Marijuana abuse   . MENORRHAGIA 12/14/2008   Qualifier: Diagnosis of  By: [name], Junie Panning    . Moderate aortic insufficiency    a. 04/08/2016 Echo: mod AI.  [name] Moderate to Severe Mitral Regurgitation    a. 04/08/2016 Echo: mod-sev MR directed centrally.  . Tobacco abuse     Current Medications: No outpatient medications have been marked as taking for the 07/03/20 encounter (Appointment) with Richardson Dopp T, PA-C.     Allergies:   Ibuprofen   Social History   Tobacco Use  . Smoking status: Current Every Day Smoker    Packs/day: 0.50    Years: 15.00    Pack years: 7.50    Types: Cigarettes  . Smokeless tobacco: Never Used  Vaping Use  . Vaping Use: Never used  Substance Use Topics  . Alcohol use: Yes    Comment: At least 12 ounces of homemade moon shine every other day.  . Drug use: Yes    Types: Marijuana  Comment: Smokes marijuana daily.  Used to smoke cocaine laced  marijuana cigarettes for 15-20 yrs but quit cocaine 6 yrs ago.     Family Hx: The patient's family history includes CAD in her mother; Cirrhosis in her father; Lupus in her sister. There is no history of Colon cancer or Colon polyps.  ROS   EKGs/Labs/Other Test Reviewed:    EKG:  EKG is *** ordered today.  The ekg ordered today demonstrates ***  Recent Labs: 10/13/2019: B Natriuretic Peptide 11.7 03/17/2020: ALT 37; BUN <5; Creatinine, Ser 0.73; Hemoglobin 15.1; Platelets 213; Potassium 3.7; Sodium 136   Recent Lipid Panel Lab Results  Component Value Date/Time   CHOL 162 04/25/2019 10:45 AM   TRIG 133 04/25/2019 10:45 AM   HDL 52 04/25/2019 10:45 AM   CHOLHDL 3.1  04/25/2019 10:45 AM   CHOLHDL 4.0 Ratio 11/12/2007 08:44 PM   LDLCALC 87 04/25/2019 10:45 AM      Risk Assessment/Calculations:   {Does this patient have ATRIAL FIBRILLATION?[phone]}  Physical Exam:    VS:  There were no vitals taken for this visit.    Wt Readings from Last 3 Encounters:  04/03/20 130 lb 9.6 oz (59.2 kg)  03/21/20 132 lb 3.2 oz (60 kg)  03/17/20 130 lb (59 kg)     Physical Exam ***  ASSESSMENT & PLAN:    ***  {Are you ordering a CV Procedure (e.g. stress test, cath, DCCV, TEE, etc)?   Press F2        :[id number]}    Dispo:  No follow-ups on file.   Medication Adjustments/Labs and Tests Ordered: Current medicines are reviewed at length with the patient today.  Concerns regarding medicines are outlined above.  Tests Ordered: No orders of the defined types were placed in this encounter.  Medication Changes: No orders of the defined types were placed in this encounter.   Signed, Richardson Dopp, PA-C  07/02/2020 9:47 PM    [facility] [address], [place], [place]  [address] Phone: [phone]; Fax: [phone]

## 2020-07-03 ENCOUNTER — Ambulatory Visit: Payer: Self-pay | Admitting: Physician Assistant

## 2020-08-12 ENCOUNTER — Encounter: Payer: Self-pay | Admitting: Cardiovascular Disease

## 2020-08-12 NOTE — Progress Notes (Signed)
This encounter was created in error - please disregard.

## 2020-08-13 ENCOUNTER — Encounter: Payer: Self-pay | Admitting: Cardiovascular Disease

## 2020-11-06 ENCOUNTER — Other Ambulatory Visit: Payer: Self-pay

## 2020-11-06 ENCOUNTER — Encounter: Payer: Self-pay | Admitting: Cardiovascular Disease

## 2020-11-06 ENCOUNTER — Ambulatory Visit (INDEPENDENT_AMBULATORY_CARE_PROVIDER_SITE_OTHER): Payer: Self-pay | Admitting: Cardiovascular Disease

## 2020-11-06 VITALS — BP 128/82 | HR 98 | Ht 65.5 in | Wt 135.0 lb

## 2020-11-06 DIAGNOSIS — I10 Essential (primary) hypertension: Secondary | ICD-10-CM

## 2020-11-06 DIAGNOSIS — I5041 Acute combined systolic (congestive) and diastolic (congestive) heart failure: Secondary | ICD-10-CM

## 2020-11-06 MED ORDER — CARVEDILOL 3.125 MG PO TABS
3.1250 mg | ORAL_TABLET | Freq: Two times a day (BID) | ORAL | 3 refills | Status: DC
  Filled 2020-11-06 – 2020-11-13 (×2): qty 60, 30d supply, fill #0

## 2020-11-06 MED ORDER — LOSARTAN POTASSIUM 25 MG PO TABS
25.0000 mg | ORAL_TABLET | Freq: Every day | ORAL | 3 refills | Status: DC
  Filled 2020-11-06 – 2020-11-13 (×2): qty 30, 30d supply, fill #0

## 2020-11-06 NOTE — Progress Notes (Signed)
Cardiology Office Note   Date:  11/06/2020   ID:  Rachael Stone, DOB February 11, 1966, MRN [id number]  PCP:  Kerin Perna, NP  Cardiologist:  Dr. Acie Fredrickson    Chief Complaint  Patient presents with  . Congestive Heart Failure      Notes from Rachael Stone: Rachael Stone is a 55 y.o. female who presents for post hospital for progressive dyspnea and orthopnea and has been found to have LV dysfunction (EF 35%), mod AI, and mod - sev MR.  She has a  h/o tob, alcohol, marijuana, and remote cocaine abuse.  She has no prior cardiac hx.  She had CTA of chest without PE + pulmonary edema and LUL nodule of 38mm.  Echo with EF 30% and mod-sev MR and mod AI. [name]    She had R & L cardiac cath   LV end diastolic pressure is normal.  There is moderate left ventricular systolic dysfunction.  The left ventricular ejection fraction is 35% by visual estimate.  There is no aortic valve stenosis.  LV end diastolic pressure is normal  Normal coronary arteries.  She has done well no further SOB, swelling, which was in her abd or chest pain.  She has decreased cigarettes to 5 per day down from 10 per day.  Could not afford nicotene patches.  She has cut out salt in her diet.  Unfortunately she has not taken her meds for 2 days, she is worried they will hurt her heart.  We had a long discussion on what happened to her heart and the weakness of her ventricle. We discussed how important the meds were to improve her heart.     Feb.   2, 2018: Seen with her sister, Rachael Stone.   She stopped her meds when they ran out in Dec.   Says she felt better off the meds.   She says that she does not have any symptoms.   Does not want to restart meds.   Saw Rachael Stone who refilled her meds.  Patient did not Get her medications filled. She states that she does not understand why she needs to take these medications and does not want to take medications or her life.  Nov 14, 2016:  Rachael Stone is seen today with  her sister, Rachael Stone.  No CP or dyspnea.    Donates plasma twice a week.   BP is typically normal there  Dec. 6,2018:  Rachael Stone had an echocardiogram recently that revealed worsening systolic congestive heart failure and moderate to severe aortic insufficiency. Seen with her sister , Rachael Stone  No real increase in dyspnea.   September 11, 2017:   Rachael Stone is seen by her self today . Seems to be doing well   Has mild dyspnea.   Had a TEE  Her ejection fraction is now normal with an EF of 55-60%.  She has moderate aortic insufficiency, mild mitral regurgitation Her left ventricular function has improved and her aortic insufficiency and mitral regurgitation have both improved as well since her previous echo.  Complains about having to urinate so much with the lasix   Dec. 8 2020 :  Rachael Stone is seen today for follow-up of her congestive heart failure, mild mitral vegetation, hyperlipidemia.  She also has mild to moderate aortic insufficiency. Avoids salty foods.   January 13, 2020;   Rachael Stone is seen today  Echo recently showed normal LV size and function Moderate -severe AI  No real symptoms.   Not much exercise  April 03, 2020: Rachael Stone  is seen today for follow-up of her congestive heart failure, BP has been low Has a poor appetite,  Has had diarrhea for past week or 2.  Has seen GI.  Has submitted stool samples.   Breathing has been ok  Wt today is 130 lbs.  ( 19 lb weight loss in 9 months )  Drinks 4 beers + 2 shots of whiskey per night   November 06, 2020: Rachael Stone seen today for follow-up of her congestive heart failure.  When I saw her in September she was having lots of diarrhea and a poor appetite.  She had lost quite a bit of weight.  I did not think that she would tolerate restarting her heart failure medications at that time. Wt is 135 lbs ( up 5 lbs )  Still eating [employer] ( works there now)  Still drinks 4 beers and 2 shots of whisky at night ( most night )  We discussed that  this amount of ETOH will cause poor appetite, diarrhea, weight loss    Past Medical History:  Diagnosis Date  . Back pain   . Cardiomyopathy ([place])    a. 04/08/2016 Echo: EF 35%, diff HK, mild LVH, Gr2 DD, mod AI, mod to sev MR, mildly dil LA.  [name] DEPRESSIVE DISORDER NOT ELSEWHERE CLASSIFIED 12/14/2008   Qualifier: Diagnosis of  By: [name], Junie Panning    . DYSFUNCTIONAL UTERINE BLEEDING 11/12/2007   Qualifier: Diagnosis of  By: [name], Junie Panning    . ETOH abuse   . Hypertensive heart disease with heart failure ([place])   . Marijuana abuse   . MENORRHAGIA 12/14/2008   Qualifier: Diagnosis of  By: [name], Junie Panning    . Moderate aortic insufficiency    a. 04/08/2016 Echo: mod AI.  [name] Moderate to Severe Mitral Regurgitation    a. 04/08/2016 Echo: mod-sev MR directed centrally.  . Tobacco abuse     Past Surgical History:  Procedure Laterality Date  . CARDIAC CATHETERIZATION N/A 04/10/2016   Procedure: Right/Left Heart Cath and Coronary Angiography;  Surgeon: [name], MD;  Location: [facility];  Service: Cardiovascular;  Laterality: N/A;  . EXPLORATORY LAPAROTOMY  2003   Ectopic Pregnancy - unsure which side or if tube was removed  . TEE WITHOUT CARDIOVERSION N/A 06/26/2017   Procedure: TRANSESOPHAGEAL ECHOCARDIOGRAM (TEE);  Surgeon: [name], MD;  Location: [facility] END[facility];  Service: Cardiovascular;  Laterality: N/A;  . TUBAL LIGATION       No current outpatient medications on file.   No current facility-administered medications for this visit.    Allergies:   Ibuprofen    Social History:  The patient  reports that she has been smoking cigarettes. She has a 7.50 pack-year smoking history. She has never used smokeless tobacco. She reports current alcohol use. She reports current drug use. Drug: Marijuana.   Family History:  The patient's family history includes CAD in her mother; Cirrhosis in her father; Lupus in her sister.    ROS   - negative   Wt Readings from Last 3  Encounters:  11/06/20 135 lb (61.2 kg)  04/03/20 130 lb 9.6 oz (59.2 kg)  03/21/20 132 lb 3.2 oz (60 kg)      Physical Exam: Blood pressure 128/82, pulse 98, height 5' 5.5" (1.664 m), weight 135 lb (61.2 kg), SpO2 96 %.  GEN:  Chronically ill appearing female  HEENT: Normal NECK: No JVD; No carotid bruits LYMPHATICS: No lymphadenopathy  CARDIAC: RRR  5-9/5 diastolic murmur  RESPIRATORY:  Clear to auscultation without rales, wheezing or rhonchi  ABDOMEN: Soft, non-tender, non-distended MUSCULOSKELETAL:  No edema; No deformity  SKIN: Warm and dry NEUROLOGIC:  Alert and oriented x 3   EKG:      Recent Labs: 03/17/2020: ALT 37; BUN <5; Creatinine, Ser 0.73; Hemoglobin 15.1; Platelets 213; Potassium 3.7; Sodium 136    Lipid Panel    Component Value Date/Time   CHOL 162 04/25/2019 1045   TRIG 133 04/25/2019 1045   HDL 52 04/25/2019 1045   CHOLHDL 3.1 04/25/2019 1045   CHOLHDL 4.0 Ratio 11/12/2007 2044   VLDL 32 11/12/2007 2044   LDLCALC 87 04/25/2019 1045       Other studies Reviewed: Additional studies/ records that were reviewed today include: .  Cardiac Cath 04/10/16 Conclusion     LV end diastolic pressure is normal.  There is moderate left ventricular systolic dysfunction.  The left ventricular ejection fraction is 35% by visual estimate.  There is no aortic valve stenosis.  LV end diastolic pressure is normal.   Nonischemic cardiomyopathy.  Continue medical therapy.   Indications   Acute combined systolic and diastolic heart failure (HCC) [I50.41 (ICD-10-CM)]  Dyspnea [R06.00 (ICD-10-CM)]   Hemo Data   Flowsheet Row Most Recent Value  Fick Cardiac Output 4.36 L/min  Fick Cardiac Output Index 2.46 (L/min)/BSA  RA A Wave 9 mmHg  RA V Wave 6 mmHg  RA Mean 4 mmHg  RV Systolic Pressure 28 mmHg  RV Diastolic Pressure 3 mmHg  RV EDP 6 mmHg  PA Systolic Pressure 28 mmHg  PA Diastolic Pressure 13 mmHg  PA Mean 20 mmHg  PW A Wave 10 mmHg  PW V  Wave 9 mmHg  PW Mean 8 mmHg  AO Systolic Pressure 638 mmHg  AO Diastolic Pressure 77 mmHg  AO Mean 99 mmHg  LV Systolic Pressure 756 mmHg  LV Diastolic Pressure 7 mmHg  LV EDP 12 mmHg  Arterial Occlusion Pressure Extended Systolic Pressure 433 mmHg  Arterial Occlusion Pressure Extended Diastolic Pressure 76 mmHg  Arterial Occlusion Pressure Extended Mean Pressure 98 mmHg  Left Ventricular Apex Extended Systolic Pressure 295 mmHg  Left Ventricular Apex Extended Diastolic Pressure 8 mmHg  Left Ventricular Apex Extended EDP Pressure 9 mmHg  QP/QS 1  TPVR Index 8.13 HRUI  TSVR Index 40.21 HRUI  PVR SVR Ratio 0.13  TPVR/TSVR Ratio    ECHO: 04/08/16 Study Conclusions  - Left ventricle: The cavity size was mildly dilated. Wall   thickness was increased in a pattern of mild LVH. The estimated   ejection fraction was 35%. Diffuse hypokinesis. Features are   consistent with a pseudonormal left ventricular filling pattern,   with concomitant abnormal relaxation and increased filling   pressure (grade 2 diastolic dysfunction). - Aortic valve: Trileaflet; mildly calcified leaflets. There was no   stenosis. There was moderate regurgitation. There was no   holodiastolic flow reversal noted in the descending thoracic   aorta. - Mitral valve: There was moderate to severe regurgitation directed   centrally. - Left atrium: The atrium was mildly dilated. - Right ventricle: The cavity size was normal. Systolic function   was normal. - Pulmonary arteries: No complete TR doppler jet so unable to   estimate PA systolic pressure. - Systemic veins: IVC measured 1.7 cm with < 50% respirophasic   variation, suggesting RA pressure 8 mmHg.  Impressions:  - Mildly dilated LV with mild LV hypertrophy. EF 35%, diffuse  hypokinesis. Moderate diastolic dysfunction. Moderate aortic   insufficiency with trileaflet aortic valve. Moderate to severe   mitral regurgitation. MR was central, no definite  leaflet   abnormalities noted.    ASSESSMENT AND PLAN:  1.  Acute combined systolic and diastolic heart failure ([place])-    She is off all her meds.   Will restart Losartan 25 mg a day ,  Coreg 3.125 BID  BMP in 3 weeks appt with APP in 3 months to titreate up meds.     2.  Hypertensive heart disease with heart failure (HCC)  BP is fairly well controlled.      3. aortic insufficiency-  Has moderate - severe AI .   LV size has been normal.  Recheck echo next year   4.   ETOH abuse:  She drinks 4 beers and has 2 shots almost every night  We discussed having her reduce this  Also recommneded smoking cessation   5.   Mitral Regurgitation-    Stable.   6.   Tobacco abuse.:     Advised smoking cessation.  7.   Normal coronary arteries    Mertie Moores, MD  11/06/2020 10:25 AM    [facility] [place],  [place], [place]  [address] Pager [phone] Phone: [phone]; Fax: [phone]

## 2020-11-06 NOTE — Patient Instructions (Signed)
Medication Instructions:  Your physician has recommended you make the following change in your medication:   Carvedilol 3.125mg  twice a day Losartan 25mg  daily  *If you need a refill on your cardiac medications before your next appointment, please call your pharmacy*   Lab Work: Your physician recommends that you return for lab work in: 3 weeks   Testing/Procedures: none   Follow-Up: At [facility], you and your health needs are our priority.  As part of our continuing mission to provide you with exceptional heart care, we have created designated Provider Care Teams.  These Care Teams include your primary Cardiologist (physician) and Advanced Practice Providers (APPs -  Physician Assistants and Nurse Practitioners) who all work together to provide you with the care you need, when you need it.   Your next appointment:   3 month(s)  The format for your next appointment:   In Person  Provider:   You will see one of the following Advanced Practice Providers on your designated Care Team:    Richardson Dopp, PA-C  [place], [place]

## 2020-11-13 ENCOUNTER — Other Ambulatory Visit: Payer: Self-pay

## 2020-11-27 ENCOUNTER — Other Ambulatory Visit: Payer: Medicaid Other

## 2020-12-05 ENCOUNTER — Emergency Department (HOSPITAL_COMMUNITY): Payer: Self-pay

## 2020-12-05 ENCOUNTER — Emergency Department (HOSPITAL_COMMUNITY)
Admission: EM | Admit: 2020-12-05 | Discharge: 2020-12-05 | Disposition: A | Discharge status: Home / Self Care | Payer: Self-pay | Attending: Emergency Medicine | Admitting: Emergency Medicine

## 2020-12-05 ENCOUNTER — Other Ambulatory Visit: Payer: Self-pay

## 2020-12-05 ENCOUNTER — Encounter (HOSPITAL_COMMUNITY): Payer: Self-pay | Admitting: Emergency Medicine

## 2020-12-05 DIAGNOSIS — Z79899 Other long term (current) drug therapy: Secondary | ICD-10-CM | POA: Insufficient documentation

## 2020-12-05 DIAGNOSIS — I5041 Acute combined systolic (congestive) and diastolic (congestive) heart failure: Secondary | ICD-10-CM | POA: Insufficient documentation

## 2020-12-05 DIAGNOSIS — R000 Tachycardia, unspecified: Secondary | ICD-10-CM | POA: Insufficient documentation

## 2020-12-05 DIAGNOSIS — I110 Hypertensive heart disease with heart failure: Secondary | ICD-10-CM | POA: Insufficient documentation

## 2020-12-05 DIAGNOSIS — Z20822 Contact with and (suspected) exposure to covid-19: Secondary | ICD-10-CM | POA: Insufficient documentation

## 2020-12-05 DIAGNOSIS — R519 Headache, unspecified: Secondary | ICD-10-CM | POA: Insufficient documentation

## 2020-12-05 DIAGNOSIS — I5033 Acute on chronic diastolic (congestive) heart failure: Secondary | ICD-10-CM

## 2020-12-05 DIAGNOSIS — F17210 Nicotine dependence, cigarettes, uncomplicated: Secondary | ICD-10-CM | POA: Insufficient documentation

## 2020-12-05 LAB — RESP PANEL BY RT-PCR (FLU A&B, COVID) ARPGX2
Influenza A by PCR: NEGATIVE
Influenza B by PCR: NEGATIVE
SARS Coronavirus 2 by RT PCR: NEGATIVE

## 2020-12-05 LAB — BASIC METABOLIC PANEL
Anion gap: 17 — ABNORMAL HIGH (ref 5–15)
BUN: <5 mg/dL — ABNORMAL LOW (ref 6–20)
CO2: 21 mmol/L — ABNORMAL LOW (ref 22–32)
Calcium: 10 mg/dL (ref 8.9–10.3)
Chloride: 99 mmol/L (ref 98–111)
Creatinine, Ser: 0.66 mg/dL (ref 0.44–1.00)
GFR, Estimated: >60 mL/min (ref >60)
Glucose, Bld: 117 mg/dL — ABNORMAL HIGH (ref 70–99)
Potassium: 3.8 mmol/L (ref 3.5–5.1)
Sodium: 137 mmol/L (ref 135–145)

## 2020-12-05 LAB — CBC
HCT: 43.5 % (ref 36.0–46.0)
Hemoglobin: 14.3 g/dL (ref 12.0–15.0)
MCH: 30 pg (ref 26.0–34.0)
MCHC: 32.9 g/dL (ref 30.0–36.0)
MCV: 91.4 fL (ref 80.0–100.0)
Platelets: 162 10*3/uL (ref 150–400)
RBC: 4.76 MIL/uL (ref 3.87–5.11)
RDW: 14.9 % (ref 11.5–15.5)
WBC: 5.3 10*3/uL (ref 4.0–10.5)
nRBC: 0 % (ref 0.0–0.2)

## 2020-12-05 LAB — BRAIN NATRIURETIC PEPTIDE: B Natriuretic Peptide: 223.5 pg/mL — ABNORMAL HIGH (ref 0.0–100.0)

## 2020-12-05 LAB — TROPONIN I (HIGH SENSITIVITY)
Troponin I (High Sensitivity): 8 ng/L (ref <18)
Troponin I (High Sensitivity): 9 ng/L (ref <18)

## 2020-12-05 LAB — D-DIMER, QUANTITATIVE: D-Dimer, Quant: 0.83 ug/mL-FEU — ABNORMAL HIGH (ref 0.00–0.50)

## 2020-12-05 MED ORDER — FUROSEMIDE 10 MG/ML IJ SOLN
40.0000 mg | Freq: Once | INTRAMUSCULAR | Status: AC
  Administered 2020-12-05: 40 mg via INTRAVENOUS
  Filled 2020-12-05: qty 4

## 2020-12-05 MED ORDER — FUROSEMIDE 20 MG PO TABS
20.0000 mg | ORAL_TABLET | Freq: Every day | ORAL | 0 refills | Status: DC
  Filled 2020-12-05: qty 30, 30d supply, fill #0

## 2020-12-05 MED ORDER — SODIUM CHLORIDE 0.9 % IV BOLUS
1000.0000 mL | Freq: Once | INTRAVENOUS | Status: AC
  Administered 2020-12-05: 1000 mL via INTRAVENOUS

## 2020-12-05 MED ORDER — PROCHLORPERAZINE EDISYLATE 10 MG/2ML IJ SOLN
10.0000 mg | Freq: Once | INTRAMUSCULAR | Status: AC
  Administered 2020-12-05: 10 mg via INTRAVENOUS
  Filled 2020-12-05: qty 2

## 2020-12-05 MED ORDER — IOHEXOL 350 MG/ML SOLN
50.0000 mL | Freq: Once | INTRAVENOUS | Status: AC | PRN
  Administered 2020-12-05: 50 mL via INTRAVENOUS

## 2020-12-05 NOTE — ED Provider Notes (Signed)
[facility] EMERGENCY DEPARTMENT Provider Note   CSN: [id number] Arrival date & time: 12/05/20  3329     History Chief Complaint  Patient presents with  . SOB    Rachael Stone is a 55 y.o. female.  HPI Patient with history of cardiomyopathy presents with dyspnea, chest tightness.  Symptoms of been present for about 1 week, starting without any obvious precipitant.  She has had episodes of dyspnea with activity and with rest.  Chest tightness is similar.  Now, over the past day she has developed transient episodes of left arm and leg discomfort described as cramping,/numbness.  Currently she has none of this latter concern, but continues to complain of dyspnea.  Over the past weeks patient has had weight loss, unintentional. She works in a [employer].  She has received COVID vaccines.    Past Medical History:  Diagnosis Date  . Back pain   . Cardiomyopathy ([place])    a. 04/08/2016 Echo: EF 35%, diff HK, mild LVH, Gr2 DD, mod AI, mod to sev MR, mildly dil LA.  [name] DEPRESSIVE DISORDER NOT ELSEWHERE CLASSIFIED 12/14/2008   Qualifier: Diagnosis of  By: [name], Junie Panning    . DYSFUNCTIONAL UTERINE BLEEDING 11/12/2007   Qualifier: Diagnosis of  By: [name], Junie Panning    . ETOH abuse   . Hypertensive heart disease with heart failure ([place])   . Marijuana abuse   . MENORRHAGIA 12/14/2008   Qualifier: Diagnosis of  By: [name], Junie Panning    . Moderate aortic insufficiency    a. 04/08/2016 Echo: mod AI.  [name] Moderate to Severe Mitral Regurgitation    a. 04/08/2016 Echo: mod-sev MR directed centrally.  . Tobacco abuse     Patient Active Problem List   Diagnosis Date Noted  . Alcoholism ([place])   . Hypokalemia   . Hospital discharge follow-up   . BV (bacterial vaginosis) 01/26/2017  . Trichomonal infection 01/26/2017  . Essential hypertension 11/14/2016  . Normal coronary arteries 04/11/2016  . Hypertensive heart disease with heart failure ([place])   . NICM (nonischemic  cardiomyopathy) ([place])   . Nonrheumatic aortic valve insufficiency   . Moderate to Severe Mitral Regurgitation   . Tobacco abuse   . Acute pulmonary edema ([place]) 04/08/2016  . Elevated blood pressure 04/08/2016  . Dyspnea   . Acute combined systolic and diastolic heart failure ([place]) 04/07/2016  . VAGINAL DISCHARGE 09/10/2009  . CHEST PAIN UNSPECIFIED 08/13/2009  . DEPRESSIVE DISORDER NOT ELSEWHERE CLASSIFIED 12/14/2008  . MENORRHAGIA 12/14/2008  . NAUSEA, CHRONIC 12/14/2008  . ELEVATED BLOOD PRESSURE WITHOUT DIAGNOSIS OF HYPERTENSION 12/14/2008  . DYSFUNCTIONAL UTERINE BLEEDING 11/12/2007  . TOBACCO DEPENDENCE 09/10/2006  . GASTROESOPHAGEAL REFLUX, NO ESOPHAGITIS 09/10/2006    Past Surgical History:  Procedure Laterality Date  . CARDIAC CATHETERIZATION N/A 04/10/2016   Procedure: Right/Left Heart Cath and Coronary Angiography;  Surgeon: [name], MD;  Location: [facility];  Service: Cardiovascular;  Laterality: N/A;  . EXPLORATORY LAPAROTOMY  2003   Ectopic Pregnancy - unsure which side or if tube was removed  . TEE WITHOUT CARDIOVERSION N/A 06/26/2017   Procedure: TRANSESOPHAGEAL ECHOCARDIOGRAM (TEE);  Surgeon: [name], MD;  Location: [facility] ENDOSCOPY;  Service: Cardiovascular;  Laterality: N/A;  . TUBAL LIGATION       OB History    Gravida  3   Para  2   Term  0   Preterm  0   AB  1   Living  2     SAB  0   IAB  0   Ectopic  1   Multiple  0   Live Births              Family History  Problem Relation Age of Onset  . CAD Mother        First MI @ 70 - 84 total.  . Cirrhosis Father        alcoholic - died in his 47'W.  . Lupus Sister   . Colon cancer Neg Hx   . Colon polyps Neg Hx     Social History   Tobacco Use  . Smoking status: Current Every Day Smoker    Packs/day: 0.50    Years: 15.00    Pack years: 7.50    Types: Cigarettes  . Smokeless tobacco: Never Used  Vaping Use  . Vaping Use: Never used  Substance Use  Topics  . Alcohol use: Yes    Comment: At least 12 ounces of homemade moon shine every other day.  . Drug use: Yes    Types: Marijuana    Comment: Smokes marijuana daily.  Used to smoke cocaine laced  marijuana cigarettes for 15-20 yrs but quit cocaine 6 yrs ago.    Home Medications Prior to Admission medications   Medication Sig Start Date End Date Taking? Authorizing Provider  carvedilol (COREG) 3.125 MG tablet Take 1 tablet (3.125 mg total) by mouth 2 (two) times daily. 11/06/20   Nahser, Wonda Cheng, MD  losartan (COZAAR) 25 MG tablet Take 1 tablet (25 mg total) by mouth daily. 11/06/20   Nahser, Wonda Cheng, MD    Allergies    Ibuprofen  Review of Systems   Review of Systems  Constitutional:       Per HPI, otherwise negative  HENT:       Per HPI, otherwise negative  Respiratory:       Per HPI, otherwise negative  Cardiovascular:       Per HPI, otherwise negative  Gastrointestinal: Negative for vomiting.  Endocrine:       Negative aside from HPI  Genitourinary:       Neg aside from HPI   Musculoskeletal:       Per HPI, otherwise negative  Skin: Negative.   Neurological: Negative for syncope.    Physical Exam Updated Vital Signs BP (!) 185/91   Pulse (!) 106   Temp 97.9 F (36.6 C) (Oral)   Resp (!) 22   Ht 5\' 5"  (1.651 m)   SpO2 100%   BMI 22.47 kg/m   Physical Exam Vitals and nursing note reviewed.  Constitutional:      General: She is not in acute distress.    Appearance: She is well-developed.  HENT:     Head: Normocephalic and atraumatic.  Eyes:     Conjunctiva/sclera: Conjunctivae normal.  Cardiovascular:     Rate and Rhythm: Regular rhythm. Tachycardia present.  Pulmonary:     Effort: Pulmonary effort is normal. Tachypnea present. No respiratory distress.     Breath sounds: Normal breath sounds. No stridor.  Abdominal:     General: There is no distension.  Skin:    General: Skin is warm and dry.  Neurological:     General: No focal deficit  present.     Mental Status: She is alert and oriented to person, place, and time.     Cranial Nerves: No cranial nerve deficit.     Motor: No tremor, atrophy  or abnormal muscle tone.     Coordination: Coordination is intact.      ED Results / Procedures / Treatments   Labs (all labs ordered are listed, but only abnormal results are displayed) Labs Reviewed  RESP PANEL BY RT-PCR (FLU A&B, COVID) [id number]  CBC  BASIC METABOLIC PANEL  D-DIMER, QUANTITATIVE  TROPONIN I (HIGH SENSITIVITY)  TROPONIN I (HIGH SENSITIVITY)    EKG EKG Interpretation  Date/Time:  Wednesday Dec 05 2020 06:44:24 EDT Ventricular Rate:  121 PR Interval:  146 QRS Duration: 82 QT Interval:  330 QTC Calculation: 468 R Axis:   15 Text Interpretation: Sinus tachycardia Biatrial enlargement ST-t wave abnormality Abnormal ECG Confirmed by Carmin Muskrat [phone]) on 12/05/2020 7:49:58 AM   Radiology DG Chest 2 View  Result Date: 12/05/2020 CLINICAL DATA:  Shortness of breath for a week. Numbness in the left arm today EXAM: CHEST - 2 VIEW COMPARISON:  03/17/2020 FINDINGS: Normal heart size and mediastinal contours. No acute infiltrate or edema. No effusion or pneumothorax. No acute osseous findings. IMPRESSION: No active cardiopulmonary disease. Electronically Signed   By: Monte Fantasia M.D.   On: 12/05/2020 07:11   CT Head Wo Contrast  Result Date: 12/05/2020 CLINICAL DATA:  Intermittent left upper and left lower extremity weakness for 2 days with headache. Stroke suspected. EXAM: CT HEAD WITHOUT CONTRAST TECHNIQUE: Contiguous axial images were obtained from the base of the skull through the vertex without intravenous contrast. COMPARISON:  Brain MRI 10/13/2019 FINDINGS: Brain: No evidence of acute infarction, hemorrhage, hydrocephalus, extra-axial collection or mass lesion/mass effect. Vascular: No hyperdense vessel when accounting for streak artifact on reformats. Skull: Normal. Negative for fracture or focal  lesion. Sinuses/Orbits: No acute finding. IMPRESSION: Negative head CT. Electronically Signed   By: Monte Fantasia M.D.   On: 12/05/2020 07:43    Procedures Procedures   Medications Ordered in ED Medications  sodium chloride 0.9 % bolus 1,000 mL (1,000 mLs Intravenous New Bag/Given 12/05/20 0739)  prochlorperazine (COMPAZINE) injection 10 mg (10 mg Intravenous Given 12/05/20 4196)    ED Course  I have reviewed the triage vital signs and the nursing notes.  Pertinent labs & imaging results that were available during my care of the patient were reviewed by me and considered in my medical decision making (see chart for details).   Cardiac 120s, sinus, abnormal Pulse ox 99% room air normal   2:40 PM Patient in no distress, no oxygen requirement, heart rate has improved substantially, though she remains mildly hypertensive. I discussed her case results with her at length, including concern for worsening heart failure.  On reviewing her prior echocardiogram, clear she has diastolic dysfunction.  She not currently taking Lasix.  We discussed indication for admission, but patient has a strong preference for discharge with close outpatient follow-up. I discussed her case with her cardiology colleagues and they will contact the patient for follow-up in the coming days. Without concurrent evidence for pneumonia, bacteremia, sepsis, decompensated state, given improvement here following initiation of IV Lasix, patient discharged, per request to follow-up as an outpatient. MDM Rules/Calculators/A&P MDM Number of Diagnoses or Management Options Acute on chronic diastolic congestive heart failure ([place]): established, worsening   Amount and/or Complexity of Data Reviewed Clinical lab tests: ordered and reviewed Tests in the radiology section of CPT: ordered and reviewed Tests in the medicine section of CPT: ordered and reviewed Decide to obtain previous medical records or to obtain history from  someone other than the patient: yes Review and summarize  past medical records: yes Discuss the patient with other providers: yes Independent visualization of images, tracings, or specimens: yes  Risk of Complications, Morbidity, and/or Mortality Presenting problems: high Diagnostic procedures: high Management options: high  Critical Care Total time providing critical care: < 30 minutes  Patient Progress Patient progress: improved  Final Clinical Impression(s) / ED Diagnoses Final diagnoses:  Acute on chronic diastolic congestive heart failure ([place])    Rx / DC Orders ED Discharge Orders         Ordered    furosemide (LASIX) 20 MG tablet  Daily        12/05/20 1439           Carmin Muskrat, MD 12/05/20 1441

## 2020-12-05 NOTE — ED Triage Notes (Signed)
Patient reports SOB with chest tightness/congestion onset last week , patient added left arm and left leg pain with numbness last week .

## 2020-12-05 NOTE — ED Notes (Signed)
Pt ambulatory to waiting room. Pt verbalized understanding of discharge instructions.   

## 2020-12-05 NOTE — Discharge Instructions (Addendum)
As discussed, although you have elected to follow-up as an outpatient, do not hesitate to return here for any concerning changes.  Otherwise, expect a phone call from our cardiology colleagues to arrange follow-up next week for appropriate ongoing care.

## 2020-12-07 ENCOUNTER — Telehealth: Payer: Self-pay | Admitting: Cardiovascular Disease

## 2020-12-07 ENCOUNTER — Telehealth: Payer: Self-pay | Admitting: Internal Medicine

## 2020-12-07 ENCOUNTER — Other Ambulatory Visit: Payer: Self-pay

## 2020-12-07 MED ORDER — FUROSEMIDE 20 MG PO TABS
20.0000 mg | ORAL_TABLET | Freq: Every day | ORAL | 0 refills | Status: DC
  Filled 2020-12-07: qty 30, 30d supply, fill #0
  Filled 2020-12-07: qty 90, 90d supply, fill #0

## 2020-12-07 NOTE — Telephone Encounter (Signed)
Patient was calling to talk with Dr. Manley Mason or nurse

## 2020-12-07 NOTE — Telephone Encounter (Signed)
*  STAT* If patient is at the pharmacy, call can be transferred to refill team.   1. Which medications need to be refilled? (please list name of each medication and dose if known) furosemide (LASIX) 20 MG tablet  2. Which pharmacy/location (including street and city if local pharmacy) is medication to be sent to? [facility]  3. Do they need a 30 day or 90 day supply? 90 day supply

## 2020-12-07 NOTE — Telephone Encounter (Signed)
Ok, per Para March, RN to Dr. Acie Fredrickson to fill Lasix 20 mg for 90 day supply, sent to [facility].

## 2020-12-07 NOTE — Telephone Encounter (Signed)
RN returned call to patient regarding a call. Patient wanted to make sure Dr. Acie Fredrickson was aware that she was started on Lasix during her hospital stay. RN advised that our records are updated. Patient would like to know if she needs to be seen earlier than 8/1. Rn advised Dr.Nahser is out of the office, but I would send a message for him to review upon return. Patient appreciative of phone call. Encouraged patient to contact the office with any questions or concerns. Patient verbalized understanding.

## 2021-01-04 ENCOUNTER — Other Ambulatory Visit: Payer: Self-pay

## 2021-01-04 ENCOUNTER — Ambulatory Visit: Payer: Self-pay | Attending: Internal Medicine | Admitting: Internal Medicine

## 2021-01-04 DIAGNOSIS — Z91199 Patient's noncompliance with other medical treatment and regimen due to unspecified reason: Secondary | ICD-10-CM

## 2021-01-04 DIAGNOSIS — Z5329 Procedure and treatment not carried out because of patient's decision for other reasons: Secondary | ICD-10-CM

## 2021-01-04 NOTE — Progress Notes (Signed)
Patient was scheduled for telephone visit.  I called and left messages on voicemail at 9:52 AM and 9:53 AM.

## 2021-02-10 NOTE — Progress Notes (Signed)
Cardiology Office Note:    Date:  02/11/2021   ID:  [name], DOB 1966-01-30, MRN [id number]  PCP:  Kerin Perna, NP   [facility] Providers Cardiologist:  Mertie Moores, MD      Referring MD: Kerin Perna, NP   Chief Complaint:  Hospitalization Follow-up (ED visit for CHF)    Patient Profile:    Rachael Stone is a 55 y.o. female with:  HFimpEF (heart failure with improved ejection fraction)  Non-ischemic cardiomyopathy  EF 35 >> returned to normal. Aortic insufficiency Echocardiogram 5/21: EF 55-60, mod to severe AI TEE 12/18: mod AI Echocardiogram 11/18: EF 55-60, mod to severe AI Mitral regurgitation  Trivial MR on Echocardiogram in 5/21  Hx of ETOH, cocaine, THC use  Hypertension   Prior CV studies: Echocardiogram 11/14/19 EF 55-60, no RWMA, GR 1 DD, GLS -15%, normal RVSF, trivial MR, moderate to severe AI AV sclerosis without stenosis  RIGHT/LEFT HEART CATH AND CORONARY ANGIOGRAPHY 04/10/2016 Narrative  LV end diastolic pressure is normal.  There is moderate left ventricular systolic dysfunction.  The left ventricular ejection fraction is 35% by visual estimate.  There is no aortic valve stenosis.  LV end diastolic pressure is normal. - No CAD  Nonischemic cardiomyopathy.  Continue medical therapy.    History of Present Illness: [name] was last seen by Dr. Acie Fredrickson in 4/22.  She was off all her medications and was placed back on losartan and carvedilol.  She was seen in the ED in 5/22 with acute congestive heart failure.  She was started on Furosemide 20 mg once daily and DC to home.  She returns for f/u.  She is here alone.  Unfortunately, she is not taking any medications.  She works at [employer].  She is unable to afford medications that cost $4 a month.  She has not had any significant worsening shortness of breath since she went to the ED.  She has chronic dyspnea on exertion. She cannot go up a flight of steps without  stopping. She has not had chest pain, syncope, leg edema.  She sleeps on 2 pillows chronically. She has not had paroxysmal nocturnal dyspnea.          Past Medical History:  Diagnosis Date   Back pain    Cardiomyopathy ([place])    a. 04/08/2016 Echo: EF 35%, diff HK, mild LVH, Gr2 DD, mod AI, mod to sev MR, mildly dil LA.   DEPRESSIVE DISORDER NOT ELSEWHERE CLASSIFIED 12/14/2008   Qualifier: Diagnosis of  By: [name], [name]     DYSFUNCTIONAL UTERINE BLEEDING 11/12/2007   Qualifier: Diagnosis of  By: [name], [name]     ETOH abuse    Hypertensive heart disease with heart failure ([place])    Marijuana abuse    MENORRHAGIA 12/14/2008   Qualifier: Diagnosis of  By: [name], [name]     Moderate aortic insufficiency    a. 04/08/2016 Echo: mod AI.   Moderate to Severe Mitral Regurgitation    a. 04/08/2016 Echo: mod-sev MR directed centrally.   Tobacco abuse     Current Medications: Current Meds  Medication Sig   [DISCONTINUED] carvedilol (COREG) 3.125 MG tablet Take 1 tablet (3.125 mg total) by mouth 2 (two) times daily.   [DISCONTINUED] furosemide (LASIX) 20 MG tablet Take 1 tablet (20 mg total) by mouth daily.   [DISCONTINUED] losartan (COZAAR) 25 MG tablet Take 1 tablet (25 mg total) by mouth daily.     Allergies:  Ibuprofen   Social History   Tobacco Use   Smoking status: Every Day    Packs/day: 0.50    Years: 15.00    Pack years: 7.50    Types: Cigarettes   Smokeless tobacco: Never  Vaping Use   Vaping Use: Never used  Substance Use Topics   Alcohol use: Yes    Comment: At least 12 ounces of homemade moon shine every other day.   Drug use: Yes    Types: Marijuana    Comment: Smokes marijuana daily.  Used to smoke cocaine laced  marijuana cigarettes for 15-20 yrs but quit cocaine 6 yrs ago.     Family Hx: The patient's family history includes CAD in her mother; Cirrhosis in her father; Lupus in her sister. There is no history of Colon cancer or Colon polyps.  ROS    EKGs/Labs/Other Test Reviewed:    EKG:  EKG is   ordered today.  The ekg ordered today demonstrates sinus tachycardia, HR 115, normal axis, inferolateral T wave inversions, QTC 453, similar to prior tracings  Recent Labs: 03/17/2020: ALT 37 12/05/2020: B Natriuretic Peptide 223.5; BUN <5; Creatinine, Ser 0.66; Hemoglobin 14.3; Platelets 162; Potassium 3.8; Sodium 137   Recent Lipid Panel Lab Results  Component Value Date/Time   CHOL 162 04/25/2019 10:45 AM   TRIG 133 04/25/2019 10:45 AM   HDL 52 04/25/2019 10:45 AM   LDLCALC 87 04/25/2019 10:45 AM      Risk Assessment/Calculations:      Physical Exam:    VS:  BP (!) 142/60   Pulse (!) 121   Ht 5' 5.5" (1.664 m)   Wt 130 lb 12.8 oz (59.3 kg)   SpO2 98%   BMI 21.44 kg/m     Wt Readings from Last 3 Encounters:  02/11/21 130 lb 12.8 oz (59.3 kg)  11/06/20 135 lb (61.2 kg)  04/03/20 130 lb 9.6 oz (59.2 kg)     Constitutional:      Appearance: Healthy appearance. Not in distress.  Neck:     Vascular: JVD normal.  Pulmonary:     Effort: Pulmonary effort is normal.     Breath sounds: No wheezing. No rales.  Cardiovascular:     Normal rate. Regular rhythm. Normal S1. Normal S2.      Murmurs: There is a grade 2/4 diastolic murmur at the LLSB.  Edema:    Peripheral edema absent.  Abdominal:     Palpations: Abdomen is soft.  Skin:    General: Skin is warm and dry.  Neurological:     Mental Status: Alert and oriented to person, place and time.     Cranial Nerves: Cranial nerves are intact.         ASSESSMENT & PLAN:    1. HFimpEF (heart failure with improved EF) EF was previously 35.  This improved to normal.  Echo in 5/21 demonstrated EF 55-60.  She did have moderate to severe aortic insufficiency on that echocardiogram.  However, she had moderate to severe aortic insufficiency in 2018 and a follow-up TEE demonstrated moderate aortic insufficiency.  She is mainly limited in management due to finances.  She is  unable to afford $4 prescriptions at [facility].  We will provide her with a [employer] gift card today so that she can at least get 90-day supply of all of her medications.  Resume carvedilol, losartan, furosemide.  Follow-up with Dr. Acie Fredrickson or me in 3 months.  We will try to not burden her with significant  cost.  However, at some point we will need to repeat her echocardiogram.  2. Nonrheumatic aortic valve insufficiency As noted, this was overall fairly stable on most recent echocardiogram.  3. Essential hypertension Blood pressure above target.  Resume carvedilol, losartan as noted.  Obtain follow-up BMET in 1 week.  4. Tobacco abuse She is trying to quit.  5. Sinus tachycardia Likely related to being off of her medications as well as chronic volume excess.  I will obtain a TSH with blood work in 1 week.  Her hemoglobin was normal in May 2022 when she was seen in the ED.  She was tachycardic at that time as well.          Dispo:  Return in about 3 months (around 05/14/2021) for  Routine follow up in 3 months with Richardson Dopp PA-C. .   Medication Adjustments/Labs and Tests Ordered: Current medicines are reviewed at length with the patient today.  Concerns regarding medicines are outlined above.  Tests Ordered: Orders Placed This Encounter  Procedures   TSH   Basic Metabolic Panel (BMET)   EKG 12-Lead   Medication Changes: Meds ordered this encounter  Medications   losartan (COZAAR) 25 MG tablet    Sig: Take 1 tablet (25 mg total) by mouth daily.    Dispense:  90 tablet    Refill:  3   furosemide (LASIX) 20 MG tablet    Sig: Take 1 tablet (20 mg total) by mouth daily.    Dispense:  90 tablet    Refill:  3   carvedilol (COREG) 3.125 MG tablet    Sig: Take 1 tablet (3.125 mg total) by mouth 2 (two) times daily.    Dispense:  180 tablet    Refill:  3    Signed, Richardson Dopp, PA-C  02/11/2021 1:54 PM    [facility] [address], [place], [place]   [address] Phone: [phone]; Fax: [phone]

## 2021-02-11 ENCOUNTER — Encounter: Payer: Self-pay | Admitting: Physician Assistant

## 2021-02-11 ENCOUNTER — Other Ambulatory Visit: Payer: Self-pay

## 2021-02-11 ENCOUNTER — Ambulatory Visit (INDEPENDENT_AMBULATORY_CARE_PROVIDER_SITE_OTHER): Payer: Self-pay | Admitting: Physician Assistant

## 2021-02-11 VITALS — BP 142/60 | HR 121 | Ht 65.5 in | Wt 130.8 lb

## 2021-02-11 DIAGNOSIS — I5032 Chronic diastolic (congestive) heart failure: Secondary | ICD-10-CM

## 2021-02-11 DIAGNOSIS — I10 Essential (primary) hypertension: Secondary | ICD-10-CM

## 2021-02-11 DIAGNOSIS — R000 Tachycardia, unspecified: Secondary | ICD-10-CM

## 2021-02-11 DIAGNOSIS — Z720 Tobacco use: Secondary | ICD-10-CM

## 2021-02-11 DIAGNOSIS — I351 Nonrheumatic aortic (valve) insufficiency: Secondary | ICD-10-CM

## 2021-02-11 MED ORDER — FUROSEMIDE 20 MG PO TABS: 20.0000 mg | ORAL_TABLET | Freq: Every day | ORAL | 3 refills | Status: DC

## 2021-02-11 MED ORDER — LOSARTAN POTASSIUM 25 MG PO TABS: 25.0000 mg | ORAL_TABLET | Freq: Every day | ORAL | 3 refills | Status: DC

## 2021-02-11 MED ORDER — CARVEDILOL 3.125 MG PO TABS: 3.1250 mg | ORAL_TABLET | Freq: Two times a day (BID) | ORAL | 3 refills | Status: DC

## 2021-02-11 NOTE — Patient Instructions (Signed)
Medication Instructions:   Your physician recommends that you continue on your current medications as directed. Please refer to the Current Medication list given to you today.  PLEASE pick up all your medications today.   *If you need a refill on your cardiac medications before your next appointment, please call your pharmacy*   Lab Work: Your physician recommends that you return for lab work on Tuesday, August 16 between 7:30 - 4:30/TSH/BMET.   If you have labs (blood work) drawn today and your tests are completely normal, you will receive your results only by: [place] (if you have MyChart) OR A paper copy in the mail If you have any lab test that is abnormal or we need to change your treatment, we will call you to review the results.   Testing/Procedures:  -NONE    Follow-Up: At [facility], you and your health needs are our priority.  As part of our continuing mission to provide you with exceptional heart care, we have created designated Provider Care Teams.  These Care Teams include your primary Cardiologist (physician) and Advanced Practice Providers (APPs -  Physician Assistants and Nurse Practitioners) who all work together to provide you with the care you need, when you need it.  We recommend signing up for the patient portal called "MyChart".  Sign up information is provided on this After Visit Summary.  MyChart is used to connect with patients for Virtual Visits (Telemedicine).  Patients are able to view lab/test results, encounter notes, upcoming appointments, etc.  Non-urgent messages can be sent to your provider as well.   To learn more about what you can do with MyChart, go to [email or web address].    Your next appointment:   3 month(s) with Richardson Dopp, PA-C ON Friday, November 4 @ 8:45 am.   The format for your next appointment:   In Person  Provider:   Richardson Dopp, PA-C   Other Instructions  -NONE

## 2021-02-26 ENCOUNTER — Other Ambulatory Visit: Payer: Self-pay | Admitting: *Deleted

## 2021-02-26 ENCOUNTER — Other Ambulatory Visit: Payer: Self-pay

## 2021-02-26 DIAGNOSIS — I351 Nonrheumatic aortic (valve) insufficiency: Secondary | ICD-10-CM

## 2021-02-26 DIAGNOSIS — I5032 Chronic diastolic (congestive) heart failure: Secondary | ICD-10-CM

## 2021-02-26 DIAGNOSIS — Z720 Tobacco use: Secondary | ICD-10-CM

## 2021-02-26 DIAGNOSIS — I10 Essential (primary) hypertension: Secondary | ICD-10-CM

## 2021-02-26 DIAGNOSIS — R000 Tachycardia, unspecified: Secondary | ICD-10-CM

## 2021-02-27 ENCOUNTER — Other Ambulatory Visit: Payer: Self-pay

## 2021-02-27 ENCOUNTER — Encounter (HOSPITAL_BASED_OUTPATIENT_CLINIC_OR_DEPARTMENT_OTHER): Payer: Self-pay

## 2021-02-27 ENCOUNTER — Other Ambulatory Visit (HOSPITAL_BASED_OUTPATIENT_CLINIC_OR_DEPARTMENT_OTHER): Payer: Self-pay | Admitting: *Deleted

## 2021-02-27 DIAGNOSIS — E876 Hypokalemia: Secondary | ICD-10-CM

## 2021-02-27 LAB — BASIC METABOLIC PANEL
BUN/Creatinine Ratio: 9 (ref 9–23)
BUN: 6 mg/dL (ref 6–24)
CO2: 20 mmol/L (ref 20–29)
Calcium: 9.3 mg/dL (ref 8.7–10.2)
Chloride: 104 mmol/L (ref 96–106)
Creatinine, Ser: 0.7 mg/dL (ref 0.57–1.00)
Glucose: 93 mg/dL (ref 65–99)
Potassium: 3.4 mmol/L — ABNORMAL LOW (ref 3.5–5.2)
Sodium: 143 mmol/L (ref 134–144)
eGFR: 103 mL/min/{1.73_m2} (ref >59)

## 2021-02-27 LAB — TSH: TSH: 0.38 u[IU]/mL — ABNORMAL LOW (ref 0.450–4.500)

## 2021-02-27 MED ORDER — POTASSIUM CHLORIDE ER 10 MEQ PO TBCR
10.0000 meq | EXTENDED_RELEASE_TABLET | Freq: Every day | ORAL | 2 refills | Status: DC
  Filled 2021-02-27: qty 30, 30d supply, fill #0
  Filled 2021-04-03: qty 30, 30d supply, fill #1

## 2021-03-04 ENCOUNTER — Other Ambulatory Visit: Payer: Self-pay

## 2021-03-26 ENCOUNTER — Ambulatory Visit (INDEPENDENT_AMBULATORY_CARE_PROVIDER_SITE_OTHER): Payer: Medicaid Other | Admitting: Primary Care

## 2021-04-03 ENCOUNTER — Other Ambulatory Visit: Payer: Self-pay

## 2021-04-04 ENCOUNTER — Other Ambulatory Visit: Payer: Self-pay

## 2021-04-12 ENCOUNTER — Ambulatory Visit (INDEPENDENT_AMBULATORY_CARE_PROVIDER_SITE_OTHER): Payer: Medicaid Other | Admitting: Primary Care

## 2021-05-13 ENCOUNTER — Encounter (INDEPENDENT_AMBULATORY_CARE_PROVIDER_SITE_OTHER): Payer: Self-pay | Admitting: Primary Care

## 2021-05-13 ENCOUNTER — Ambulatory Visit (INDEPENDENT_AMBULATORY_CARE_PROVIDER_SITE_OTHER): Payer: Self-pay | Admitting: Primary Care

## 2021-05-13 ENCOUNTER — Other Ambulatory Visit: Payer: Self-pay

## 2021-05-13 VITALS — BP 147/78 | HR 87 | Temp 97.5°F | Ht 65.5 in | Wt 132.4 lb

## 2021-05-13 DIAGNOSIS — R7989 Other specified abnormal findings of blood chemistry: Secondary | ICD-10-CM

## 2021-05-13 DIAGNOSIS — Z1231 Encounter for screening mammogram for malignant neoplasm of breast: Secondary | ICD-10-CM

## 2021-05-13 DIAGNOSIS — Z23 Encounter for immunization: Secondary | ICD-10-CM

## 2021-05-13 DIAGNOSIS — R1013 Epigastric pain: Secondary | ICD-10-CM

## 2021-05-13 DIAGNOSIS — Z124 Encounter for screening for malignant neoplasm of cervix: Secondary | ICD-10-CM

## 2021-05-13 NOTE — Patient Instructions (Signed)
Influenza, Adult °Influenza is also called "the flu." It is an infection in the lungs, nose, and throat (respiratory tract). It spreads easily from person to person (is contagious). The flu causes symptoms that are like a cold, along with high fever and body aches. °What are the causes? °This condition is caused by the influenza virus. You can get the virus by: °Breathing in droplets that are in the air after a person infected with the flu coughed or sneezed. °Touching something that has the virus on it and then touching your mouth, nose, or eyes. °What increases the risk? °Certain things may make you more likely to get the flu. These include: °Not washing your hands often. °Having close contact with many people during cold and flu season. °Touching your mouth, eyes, or nose without first washing your hands. °Not getting a flu shot every year. °You may have a higher risk for the flu, and serious problems, such as a lung infection (pneumonia), if you: °Are older than 65. °Are pregnant. °Have a weakened disease-fighting system (immune system) because of a disease or because you are taking certain medicines. °Have a long-term (chronic) condition, such as: °Heart, kidney, or lung disease. °Diabetes. °Asthma. °Have a liver disorder. °Are very overweight (morbidly obese). °Have anemia. °What are the signs or symptoms? °Symptoms usually begin suddenly and last 4-14 days. They may include: °Fever and chills. °Headaches, body aches, or muscle aches. °Sore throat. °Cough. °Runny or stuffy (congested) nose. °Feeling discomfort in your chest. °Not wanting to eat as much as normal. °Feeling weak or tired. °Feeling dizzy. °Feeling sick to your stomach or throwing up. °How is this treated? °If the flu is found early, you can be treated with antiviral medicine. This can help to reduce how bad the illness is and how long it lasts. This may be given by mouth or through an IV tube. °Taking care of yourself at home can help your  symptoms get better. Your doctor may want you to: °Take over-the-counter medicines. °Drink plenty of fluids. °The flu often goes away on its own. If you have very bad symptoms or other problems, you may be treated in a hospital. °Follow these instructions at home: °  °Activity °Rest as needed. Get plenty of sleep. °Stay home from work or school as told by your doctor. °Do not leave home until you do not have a fever for 24 hours without taking medicine. °Leave home only to go to your doctor. °Eating and drinking °Take an ORS (oral rehydration solution). This is a drink that is sold at pharmacies and stores. °Drink enough fluid to keep your pee pale yellow. °Drink clear fluids in small amounts as you are able. Clear fluids include: °Water. °Ice chips. °Fruit juice mixed with water. °Low-calorie sports drinks. °Eat bland foods that are easy to digest. Eat small amounts as you are able. These foods include: °Bananas. °Applesauce. °Rice. °Lean meats. °Toast. °Crackers. °Do not eat or drink: °Fluids that have a lot of sugar or caffeine. °Alcohol. °Spicy or fatty foods. °General instructions °Take over-the-counter and prescription medicines only as told by your doctor. °Use a cool mist humidifier to add moisture to the air in your home. This can make it easier for you to breathe. °When using a cool mist humidifier, clean it daily. Empty water and replace with clean water. °Cover your mouth and nose when you cough or sneeze. °Wash your hands with soap and water often and for at least 20 seconds. This is also important after   you cough or sneeze. If you cannot use soap and water, use alcohol-based hand sanitizer. °Keep all follow-up visits. °How is this prevented? ° °Get a flu shot every year. You may get the flu shot in late summer, fall, or winter. Ask your doctor when you should get your flu shot. °Avoid contact with people who are sick during fall and winter. This is cold and flu season. °Contact a doctor if: °You get  new symptoms. °You have: °Chest pain. °Watery poop (diarrhea). °A fever. °Your cough gets worse. °You start to have more mucus. °You feel sick to your stomach. °You throw up. °Get help right away if you: °Have shortness of breath. °Have trouble breathing. °Have skin or nails that turn a bluish color. °Have very bad pain or stiffness in your neck. °Get a sudden headache. °Get sudden pain in your face or ear. °Cannot eat or drink without throwing up. °These symptoms may represent a serious problem that is an emergency. Get medical help right away. Call your local emergency services (911 in the [place] °Do not wait to see if the symptoms will go away. °Do not drive yourself to the hospital. °Summary °Influenza is also called "the flu." It is an infection in the lungs, nose, and throat. It spreads easily from person to person. °Take over-the-counter and prescription medicines only as told by your doctor. °Getting a flu shot every year is the best way to not get the flu. °This information is not intended to replace advice given to you by your health care provider. Make sure you discuss any questions you have with your health care provider. °Document Revised: 02/17/2020 Document Reviewed: 02/17/2020 °Elsevier Patient Education © 2022 Elsevier Inc. ° °

## 2021-05-13 NOTE — Progress Notes (Signed)
[facility]: Acute Office Visit  Subjective:    [name] is a 55 y.o. female who presents for follow up of elevated TSH.  Current symptoms: diarrhea and palpitations. Patient denies nervousness. Symptoms have been basically symptomatic. She is also, concern with abdominal pain. Unable to identify source  The following portions of the patient's history were reviewed and updated as appropriate: allergies, current medications, past family history, past medical history, past social history, and past surgical history.  Review of Systems Pertinent items noted in HPI and remainder of comprehensive ROS otherwise negative.    Objective:    BP (!) 147/78 (BP Location: Right Arm, Patient Position: Sitting, Cuff Size: Normal)   Pulse 87   Temp (!) 97.5 F (36.4 C) (Temporal)   Ht 5' 5.5" (1.664 m)   Wt 132 lb 6.4 oz (60.1 kg)   SpO2 97%   BMI 21.70 kg/m  General appearance: alert, cooperative, appears stated age, and fatigued Head: Normocephalic, without obvious abnormality, atraumatic Ears: normal TM's and external ear canals both ears Neck: no adenopathy, no carotid bruit, no JVD, supple, symmetrical, trachea midline, and thyroid not enlarged, symmetric, no tenderness/mass/nodules Lungs: clear to auscultation bilaterally Heart: regular rate and rhythm, S1, S2 normal, no murmur, click, rub or gallop  Past Medical History:  Diagnosis Date   Back pain    Cardiomyopathy ([place])    a. 04/08/2016 Echo: EF 35%, diff HK, mild LVH, Gr2 DD, mod AI, mod to sev MR, mildly dil LA.   DEPRESSIVE DISORDER NOT ELSEWHERE CLASSIFIED 12/14/2008   Qualifier: Diagnosis of  By: [name], [name]     DYSFUNCTIONAL UTERINE BLEEDING 11/12/2007   Qualifier: Diagnosis of  By: [name], [name]     ETOH abuse    Hypertensive heart disease with heart failure ([place])    Marijuana abuse    MENORRHAGIA 12/14/2008   Qualifier: Diagnosis of  By: [name], [name]     Moderate aortic insufficiency     a. 04/08/2016 Echo: mod AI.   Moderate to Severe Mitral Regurgitation    a. 04/08/2016 Echo: mod-sev MR directed centrally.   Tobacco abuse     Past Surgical History:  Procedure Laterality Date   CARDIAC CATHETERIZATION N/A 04/10/2016   Procedure: Right/Left Heart Cath and Coronary Angiography;  Surgeon: [name], MD;  Location: [facility];  Service: Cardiovascular;  Laterality: N/A;   EXPLORATORY LAPAROTOMY  2003   Ectopic Pregnancy - unsure which side or if tube was removed   TEE WITHOUT CARDIOVERSION N/A 06/26/2017   Procedure: TRANSESOPHAGEAL ECHOCARDIOGRAM (TEE);  Surgeon: [name], MD;  Location: [facility] ENDOS[facility];  Service: Cardiovascular;  Laterality: N/A;   TUBAL LIGATION      Family History  Problem Relation Age of Onset   CAD Mother        First MI @ 17 - 76 total.   Cirrhosis Father        alcoholic - died in his 75'Q.   Lupus Sister    Colon cancer Neg Hx    Colon polyps Neg Hx     Social History   Socioeconomic History   Marital status: Single    Spouse name: Not on file   Number of children: Not on file   Years of education: Not on file   Highest education level: Not on file  Occupational History   Occupation: Cook  Tobacco Use   Smoking status: Every Day    Packs/day: 0.50  Years: 15.00    Pack years: 7.50    Types: Cigarettes   Smokeless tobacco: Never  Vaping Use   Vaping Use: Never used  Substance and Sexual Activity   Alcohol use: Yes    Comment: At least 12 ounces of homemade moon shine every other day.   Drug use: Yes    Types: Marijuana    Comment: Smokes marijuana daily.  Used to smoke cocaine laced  marijuana cigarettes for 15-20 yrs but quit cocaine 6 yrs ago.   Sexual activity: Yes    Birth control/protection: Other-see comments    Comment: BTL  Other Topics Concern   Not on file  Social History Narrative   Lives in [place] with her fiance.  Does not routinely exercise.   Social Determinants of Health    Financial Resource Strain: Not on file  Food Insecurity: Not on file  Transportation Needs: Not on file  Physical Activity: Not on file  Stress: Not on file  Social Connections: Not on file  Intimate Partner Violence: Not on file    Outpatient Medications Prior to Visit  Medication Sig Dispense Refill   carvedilol (COREG) 3.125 MG tablet Take 1 tablet (3.125 mg total) by mouth 2 (two) times daily. 180 tablet 3   furosemide (LASIX) 20 MG tablet Take 1 tablet (20 mg total) by mouth daily. 90 tablet 3   losartan (COZAAR) 25 MG tablet Take 1 tablet (25 mg total) by mouth daily. 90 tablet 3   potassium chloride (KLOR-CON) 10 MEQ tablet Take 1 tablet (10 mEq total) by mouth daily. 30 tablet 2   No facility-administered medications prior to visit.    Allergies  Allergen Reactions   Ibuprofen Other (See Comments)    Irritates stomach      Objective:   BP (!) 147/78 (BP Location: Right Arm, Patient Position: Sitting, Cuff Size: Normal)   Pulse 87   Temp (!) 97.5 F (36.4 C) (Temporal)   Ht 5' 5.5" (1.664 m)   Wt 132 lb 6.4 oz (60.1 kg)   SpO2 97%   BMI 21.70 kg/m  Wt Readings from Last 3 Encounters:  05/13/21 132 lb 6.4 oz (60.1 kg)  02/11/21 130 lb 12.8 oz (59.3 kg)  11/06/20 135 lb (61.2 kg)   Physical Exam  General: Vital signs reviewed.  Patient is well-developed and below Body mass index is 21.7 kg/m. , female  in no acute distress and cooperative with exam. Head: Normocephalic and atraumatic. Eyes: EOMI, conjunctivae normal, no scleral icterus. Neck: Supple, trachea midline, normal ROM, no JVD, masses, thyromegaly, or carotid bruit present. Cardiovascular: RRR, S1 normal, S2 normal, no murmurs, gallops, or rubs. Pulmonary/Chest: Clear to auscultation bilaterally, no wheezes, rales, or rhonchi. Abdominal: Soft, non-tender, non-distended, BS +, no masses, organomegaly, or guarding present. Musculoskeletal: No joint deformities, erythema, or stiffness, ROM full and  nontender. Extremities: No lower extremity edema bilaterally,  pulses symmetric and intact bilaterally. No cyanosis or clubbing. Neurological: A&O x3, Strength is normal Skin: Warm, dry and intact. No rashes or erythema. Psychiatric: Normal mood and affect. speech and behavior is normal. Cognition and memory are normal.    Health Maintenance Due  Topic Date Due   COVID-19 Vaccine (1) Never done   Pneumococcal Vaccine 70-53 Years old (1 - PCV) Never done   COLONOSCOPY (Pts 45-20yr Insurance coverage will need to be confirmed)  Never done   MAMMOGRAM  02/02/2018   Zoster Vaccines- Shingrix (1 of 2) Never done   PAP SMEAR-Modifier  01/23/2020    There are no preventive care reminders to display for this patient.   Lab Results  Component Value Date   TSH 0.211 (L) 05/13/2021   Lab Results  Component Value Date   WBC 5.3 12/05/2020   HGB 14.3 12/05/2020   HCT 43.5 12/05/2020   MCV 91.4 12/05/2020   PLT 162 12/05/2020   Lab Results  Component Value Date   NA 143 02/26/2021   K 3.4 (L) 02/26/2021   CO2 20 02/26/2021   GLUCOSE 93 02/26/2021   BUN 6 02/26/2021   CREATININE 0.70 02/26/2021   BILITOT 0.5 03/17/2020   ALKPHOS 63 03/17/2020   AST 58 (H) 03/17/2020   ALT 37 03/17/2020   PROT 7.1 03/17/2020   ALBUMIN 3.8 03/17/2020   CALCIUM 9.3 02/26/2021   ANIONGAP 17 (H) 12/05/2020   EGFR 103 02/26/2021   Lab Results  Component Value Date   CHOL 162 04/25/2019   Lab Results  Component Value Date   HDL 52 04/25/2019   Lab Results  Component Value Date   LDLCALC 87 04/25/2019   Lab Results  Component Value Date   TRIG 133 04/25/2019   Lab Results  Component Value Date   CHOLHDL 3.1 04/25/2019   Lab Results  Component Value Date   HGBA1C 5.3 01/10/2019   [place] was seen today for thyroid check .  Diagnoses and all orders for this visit:  Encounter for screening mammogram for malignant neoplasm of breast Patient completed application for BCCP while in  clinic and application has and faxed to [facility]. Patient aware that [facility] will contact her directly to schedule appointment.   Cervical cancer screening Patient completed application for BCCP while in clinic and application has and faxed to [facility]. Patient aware that [facility] will contact her directly to schedule appointment.   Elevated TSH -     TSH + free T4  Need for immunization against influenza -     Flu Vaccine QUAD 64moIM (Fluarix, Fluzone & Alfiuria Quad PF)    This note has been created with DSurveyor, quantity Any transcriptional errors are unintentional.   MKerin Perna NP

## 2021-05-14 LAB — TSH+FREE T4
Free T4: 1.31 ng/dL (ref 0.82–1.77)
TSH: 0.211 u[IU]/mL — ABNORMAL LOW (ref 0.450–4.500)

## 2021-05-14 NOTE — Progress Notes (Signed)
[facility]: Acute Office Visit  Subjective:    [name] is a 55 y.o. female who presents for follow up of elevated TSH.  Current symptoms: diarrhea and palpitations. Patient denies nervousness. Symptoms have been basically symptomatic. She is also, concern with abdominal pain. Unable to identify source  The following portions of the patient's history were reviewed and updated as appropriate: allergies, current medications, past family history, past medical history, past social history, and past surgical history.  Review of Systems Pertinent items noted in HPI and remainder of comprehensive ROS otherwise negative.    Objective:    BP (!) 147/78 (BP Location: Right Arm, Patient Position: Sitting, Cuff Size: Normal)   Pulse 87   Temp (!) 97.5 F (36.4 C) (Temporal)   Ht 5' 5.5" (1.664 m)   Wt 132 lb 6.4 oz (60.1 kg)   SpO2 97%   BMI 21.70 kg/m  General appearance: alert, cooperative, appears stated age, and fatigued Head: Normocephalic, without obvious abnormality, atraumatic Ears: normal TM's and external ear canals both ears Neck: no adenopathy, no carotid bruit, no JVD, supple, symmetrical, trachea midline, and thyroid not enlarged, symmetric, no tenderness/mass/nodules Lungs: clear to auscultation bilaterally Heart: regular rate and rhythm, S1, S2 normal, no murmur, click, rub or gallop  Past Medical History:  Diagnosis Date   Back pain    Cardiomyopathy ([place])    a. 04/08/2016 Echo: EF 35%, diff HK, mild LVH, Gr2 DD, mod AI, mod to sev MR, mildly dil LA.   DEPRESSIVE DISORDER NOT ELSEWHERE CLASSIFIED 12/14/2008   Qualifier: Diagnosis of  By: [name], [name]     DYSFUNCTIONAL UTERINE BLEEDING 11/12/2007   Qualifier: Diagnosis of  By: [name], [name]     ETOH abuse    Hypertensive heart disease with heart failure ([place])    Marijuana abuse    MENORRHAGIA 12/14/2008   Qualifier: Diagnosis of  By: [name], [name]     Moderate aortic insufficiency     a. 04/08/2016 Echo: mod AI.   Moderate to Severe Mitral Regurgitation    a. 04/08/2016 Echo: mod-sev MR directed centrally.   Tobacco abuse     Past Surgical History:  Procedure Laterality Date   CARDIAC CATHETERIZATION N/A 04/10/2016   Procedure: Right/Left Heart Cath and Coronary Angiography;  Surgeon: [name], MD;  Location: [place] [facility];  Service: Cardiovascular;  Laterality: N/A;   EXPLORATORY LAPAROTOMY  2003   Ectopic Pregnancy - unsure which side or if tube was removed   TEE WITHOUT CARDIOVERSION N/A 06/26/2017   Procedure: TRANSESOPHAGEAL ECHOCARDIOGRAM (TEE);  Surgeon: [name], MD;  Location: [facility] ENDOSCOPY;  Service: Cardiovascular;  Laterality: N/A;   TUBAL LIGATION      Family History  Problem Relation Age of Onset   CAD Mother        First MI @ 17 - 76 total.   Cirrhosis Father        alcoholic - died in his 75'Q.   Lupus Sister    Colon cancer Neg Hx    Colon polyps Neg Hx     Social History   Socioeconomic History   Marital status: Single    Spouse name: Not on file   Number of children: Not on file   Years of education: Not on file   Highest education level: Not on file  Occupational History   Occupation: Cook  Tobacco Use   Smoking status: Every Day    Packs/day: 0.50  Years: 15.00    Pack years: 7.50    Types: Cigarettes   Smokeless tobacco: Never  Vaping Use   Vaping Use: Never used  Substance and Sexual Activity   Alcohol use: Yes    Comment: At least 12 ounces of homemade moon shine every other day.   Drug use: Yes    Types: Marijuana    Comment: Smokes marijuana daily.  Used to smoke cocaine laced  marijuana cigarettes for 15-20 yrs but quit cocaine 6 yrs ago.   Sexual activity: Yes    Birth control/protection: Other-see comments    Comment: BTL  Other Topics Concern   Not on file  Social History Narrative   Lives in [place] with her fiance.  Does not routinely exercise.   Social Determinants of Health    Financial Resource Strain: Not on file  Food Insecurity: Not on file  Transportation Needs: Not on file  Physical Activity: Not on file  Stress: Not on file  Social Connections: Not on file  Intimate Partner Violence: Not on file    Outpatient Medications Prior to Visit  Medication Sig Dispense Refill   carvedilol (COREG) 3.125 MG tablet Take 1 tablet (3.125 mg total) by mouth 2 (two) times daily. 180 tablet 3   furosemide (LASIX) 20 MG tablet Take 1 tablet (20 mg total) by mouth daily. 90 tablet 3   losartan (COZAAR) 25 MG tablet Take 1 tablet (25 mg total) by mouth daily. 90 tablet 3   potassium chloride (KLOR-CON) 10 MEQ tablet Take 1 tablet (10 mEq total) by mouth daily. 30 tablet 2   No facility-administered medications prior to visit.    Allergies  Allergen Reactions   Ibuprofen Other (See Comments)    Irritates stomach      Objective:   BP (!) 147/78 (BP Location: Right Arm, Patient Position: Sitting, Cuff Size: Normal)   Pulse 87   Temp (!) 97.5 F (36.4 C) (Temporal)   Ht 5' 5.5" (1.664 m)   Wt 132 lb 6.4 oz (60.1 kg)   SpO2 97%   BMI 21.70 kg/m  Wt Readings from Last 3 Encounters:  05/13/21 132 lb 6.4 oz (60.1 kg)  02/11/21 130 lb 12.8 oz (59.3 kg)  11/06/20 135 lb (61.2 kg)   Physical Exam  General: Vital signs reviewed.  Patient is well-developed and below Body mass index is 21.7 kg/m. , female  in no acute distress and cooperative with exam. Head: Normocephalic and atraumatic. Eyes: EOMI, conjunctivae normal, no scleral icterus. Neck: Supple, trachea midline, normal ROM, no JVD, masses, thyromegaly, or carotid bruit present. Cardiovascular: RRR, S1 normal, S2 normal, no murmurs, gallops, or rubs. Pulmonary/Chest: Clear to auscultation bilaterally, no wheezes, rales, or rhonchi. Abdominal: Soft, non-tender, non-distended, BS +, no masses, organomegaly, or guarding present. Musculoskeletal: No joint deformities, erythema, or stiffness, ROM full and  nontender. Extremities: No lower extremity edema bilaterally,  pulses symmetric and intact bilaterally. No cyanosis or clubbing. Neurological: A&O x3, Strength is normal Skin: Warm, dry and intact. No rashes or erythema. Psychiatric: Normal mood and affect. speech and behavior is normal. Cognition and memory are normal.    Health Maintenance Due  Topic Date Due   COVID-19 Vaccine (1) Never done   Pneumococcal Vaccine 70-53 Years old (1 - PCV) Never done   COLONOSCOPY (Pts 45-20yr Insurance coverage will need to be confirmed)  Never done   MAMMOGRAM  02/02/2018   Zoster Vaccines- Shingrix (1 of 2) Never done   PAP SMEAR-Modifier  01/23/2020    There are no preventive care reminders to display for this patient.   Lab Results  Component Value Date   TSH 0.211 (L) 05/13/2021   Lab Results  Component Value Date   WBC 5.3 12/05/2020   HGB 14.3 12/05/2020   HCT 43.5 12/05/2020   MCV 91.4 12/05/2020   PLT 162 12/05/2020   Lab Results  Component Value Date   NA 143 02/26/2021   K 3.4 (L) 02/26/2021   CO2 20 02/26/2021   GLUCOSE 93 02/26/2021   BUN 6 02/26/2021   CREATININE 0.70 02/26/2021   BILITOT 0.5 03/17/2020   ALKPHOS 63 03/17/2020   AST 58 (H) 03/17/2020   ALT 37 03/17/2020   PROT 7.1 03/17/2020   ALBUMIN 3.8 03/17/2020   CALCIUM 9.3 02/26/2021   ANIONGAP 17 (H) 12/05/2020   EGFR 103 02/26/2021   Lab Results  Component Value Date   CHOL 162 04/25/2019   Lab Results  Component Value Date   HDL 52 04/25/2019   Lab Results  Component Value Date   LDLCALC 87 04/25/2019   Lab Results  Component Value Date   TRIG 133 04/25/2019   Lab Results  Component Value Date   CHOLHDL 3.1 04/25/2019   Lab Results  Component Value Date   HGBA1C 5.3 01/10/2019   Eva was seen today for thyroid check .  Diagnoses and all orders for this visit:  Encounter for screening mammogram for malignant neoplasm of breast Patient completed application for BCCP while in  clinic and application has and faxed to [facility]. Patient aware that [facility] will contact her directly to schedule appointment.   Cervical cancer screening Patient completed application for BCCP while in clinic and application has and faxed to [facility]. Patient aware that [facility] will contact her directly to schedule appointment.   Elevated TSH -     TSH + free T4  Need for immunization against influenza -     Flu Vaccine QUAD 64mo+IM (Fluarix, Fluzone & Alfiuria Quad PF)   Epigastric pain Discussed eating small frequent meal, reduction in acidic foods, fried foods ,spicy foods, alcohol caffeine and tobacco and certain medications. Avoid laying down after eating 48mins-1hour, elevated head of the bed.  If no improvement try Tums  This note has been created with Surveyor, quantity. Any transcriptional errors are unintentional.   Kerin Perna, NP

## 2021-05-16 DIAGNOSIS — I5032 Chronic diastolic (congestive) heart failure: Secondary | ICD-10-CM | POA: Insufficient documentation

## 2021-05-16 DIAGNOSIS — R000 Tachycardia, unspecified: Secondary | ICD-10-CM | POA: Insufficient documentation

## 2021-05-16 NOTE — Progress Notes (Deleted)
Cardiology Office Note:    Date:  05/16/2021   ID:  Rachael Stone, DOB [birth year], MRN [id number]  PCP:  Kerin Perna, NP   [facility] Providers Cardiologist:  Mertie Moores, MD { Click to update primary MD,subspecialty MD or APP then REFRESH:1}  *** Referring MD: Kerin Perna, NP   Chief Complaint:  No chief complaint on file. {Click here for Visit Info    :1}   Patient Profile:   Rachael Stone is a 55 y.o. female with:  HFimpEF (heart failure with improved ejection fraction)  Non-ischemic cardiomyopathy (Cath 2017: no CAD) EF 35 >> returned to normal. Aortic insufficiency Echocardiogram 5/21: EF 55-60, mod to severe AI TEE 12/18: mod AI Echocardiogram 11/18: EF 55-60, mod to severe AI Mitral regurgitation  Trivial MR on Echocardiogram in 5/21  Hx of ETOH, cocaine, THC use  Hypertension   History of Present Illness: Rachael Stone was last seen in 8/22.  She was off all of her medications due to cost.  We were able to provide her with a [employer] gift card so she can get a 90-day supply of her medications.  She returns for follow-up.      ASSESSMENT & PLAN:   No problem-specific Assessment & Plan notes found for this encounter. 1. HFimpEF (heart failure with improved EF) EF was previously 35.  This improved to normal.  Echo in 5/21 demonstrated EF 55-60.  She did have moderate to severe aortic insufficiency on that echocardiogram.  However, she had moderate to severe aortic insufficiency in 2018 and a follow-up TEE demonstrated moderate aortic insufficiency.  She is mainly limited in management due to finances.  She is unable to afford $4 prescriptions at [facility].  We will provide her with a [employer] gift card today so that she can at least get 90-day supply of all of her medications.  Resume carvedilol, losartan, furosemide.  Follow-up with Dr. Acie Fredrickson or me in 3 months.  We will try to not burden her with significant cost.  However, at some point we will need  to repeat her echocardiogram.   2. Nonrheumatic aortic valve insufficiency As noted, this was overall fairly stable on most recent echocardiogram.   3. Essential hypertension Blood pressure above target.  Resume carvedilol, losartan as noted.  Obtain follow-up BMET in 1 week.   4. Tobacco abuse She is trying to quit.   5. Sinus tachycardia Likely related to being off of her medications as well as chronic volume excess.  I will obtain a TSH with blood work in 1 week.  Her hemoglobin was normal in May 2022 when she was seen in the ED.  She was tachycardic at that time as well.       {Are you ordering a CV Procedure (e.g. stress test, cath, DCCV, TEE, etc)?   Press F2        :[id number]}   Dispo:  No follow-ups on file.    Prior CV studies: Echocardiogram 11/14/19 EF 55-60, no RWMA, GR 1 DD, GLS -15%, normal RVSF, trivial MR, moderate to severe AI AV sclerosis without stenosis   RIGHT/LEFT HEART CATH AND CORONARY ANGIOGRAPHY 04/10/2016 Narrative  LV end diastolic pressure is normal.  There is moderate left ventricular systolic dysfunction.  The left ventricular ejection fraction is 35% by visual estimate.  There is no aortic valve stenosis.  LV end diastolic pressure is normal. - No CAD  Nonischemic cardiomyopathy.  Continue medical therapy. {Select studies to display:26339}  Past Medical History:  Diagnosis Date   Back pain    Cardiomyopathy ([place])    a. 04/08/2016 Echo: EF 35%, diff HK, mild LVH, Gr2 DD, mod AI, mod to sev MR, mildly dil LA.   DEPRESSIVE DISORDER NOT ELSEWHERE CLASSIFIED 12/14/2008   Qualifier: Diagnosis of  By: [name], [name]     DYSFUNCTIONAL UTERINE BLEEDING 11/12/2007   Qualifier: Diagnosis of  By: [name], [name]     ETOH abuse    Hypertensive heart disease with heart failure ([place])    Marijuana abuse    MENORRHAGIA 12/14/2008   Qualifier: Diagnosis of  By: [name], [name]     Moderate aortic insufficiency    a. 04/08/2016 Echo: mod AI.   Moderate to  Severe Mitral Regurgitation    a. 04/08/2016 Echo: mod-sev MR directed centrally.   Tobacco abuse    Current Medications: No outpatient medications have been marked as taking for the 05/17/21 encounter (Appointment) with Richardson Dopp T, PA-C.    Allergies:   Ibuprofen   Social History   Tobacco Use   Smoking status: Every Day    Packs/day: 0.50    Years: 15.00    Pack years: 7.50    Types: Cigarettes   Smokeless tobacco: Never  Vaping Use   Vaping Use: Never used  Substance Use Topics   Alcohol use: Yes    Comment: At least 12 ounces of homemade moon shine every other day.   Drug use: Yes    Types: Marijuana    Comment: Smokes marijuana daily.  Used to smoke cocaine laced  marijuana cigarettes for 15-20 yrs but quit cocaine 6 yrs ago.    Family Hx: The patient's family history includes CAD in her mother; Cirrhosis in her father; Lupus in her sister. There is no history of Colon cancer or Colon polyps.  ROS   EKGs/Labs/Other Test Reviewed:    EKG:  EKG is *** ordered today.  The ekg ordered today demonstrates ***  Recent Labs: 12/05/2020: B Natriuretic Peptide 223.5; Hemoglobin 14.3; Platelets 162 02/26/2021: BUN 6; Creatinine, Ser 0.70; Potassium 3.4; Sodium 143 05/13/2021: TSH 0.211   Recent Lipid Panel Lab Results  Component Value Date/Time   CHOL 162 04/25/2019 10:45 AM   TRIG 133 04/25/2019 10:45 AM   HDL 52 04/25/2019 10:45 AM   LDLCALC 87 04/25/2019 10:45 AM     Risk Assessment/Calculations:   {Does this patient have ATRIAL FIBRILLATION?[phone]}      Physical Exam:    VS:  There were no vitals taken for this visit.    Wt Readings from Last 3 Encounters:  05/13/21 132 lb 6.4 oz (60.1 kg)  02/11/21 130 lb 12.8 oz (59.3 kg)  11/06/20 135 lb (61.2 kg)    Physical Exam ***    Medication Adjustments/Labs and Tests Ordered: Current medicines are reviewed at length with the patient today.  Concerns regarding medicines are outlined above.  Tests  Ordered: No orders of the defined types were placed in this encounter.  Medication Changes: No orders of the defined types were placed in this encounter.  Signed, Richardson Dopp, PA-C  05/16/2021 9:23 PM    [facility] [address], [place], [place]  [address] Phone: [phone]; Fax: [phone]

## 2021-05-17 ENCOUNTER — Ambulatory Visit: Payer: Self-pay | Admitting: Physician Assistant

## 2021-05-17 DIAGNOSIS — I351 Nonrheumatic aortic (valve) insufficiency: Secondary | ICD-10-CM

## 2021-05-17 DIAGNOSIS — Z720 Tobacco use: Secondary | ICD-10-CM

## 2021-05-17 DIAGNOSIS — I5032 Chronic diastolic (congestive) heart failure: Secondary | ICD-10-CM

## 2021-05-17 DIAGNOSIS — R000 Tachycardia, unspecified: Secondary | ICD-10-CM

## 2021-05-17 DIAGNOSIS — I10 Essential (primary) hypertension: Secondary | ICD-10-CM

## 2021-05-22 ENCOUNTER — Other Ambulatory Visit: Payer: Self-pay

## 2021-07-01 NOTE — Progress Notes (Signed)
This encounter was created in error - please disregard.

## 2021-11-08 ENCOUNTER — Ambulatory Visit (INDEPENDENT_AMBULATORY_CARE_PROVIDER_SITE_OTHER): Payer: Self-pay | Admitting: Cardiovascular Disease

## 2021-11-08 ENCOUNTER — Encounter: Payer: Self-pay | Admitting: Cardiovascular Disease

## 2021-11-08 ENCOUNTER — Other Ambulatory Visit: Payer: Self-pay

## 2021-11-08 VITALS — BP 138/78 | HR 94 | Ht 65.5 in | Wt 122.0 lb

## 2021-11-08 DIAGNOSIS — I351 Nonrheumatic aortic (valve) insufficiency: Secondary | ICD-10-CM

## 2021-11-08 MED ORDER — POTASSIUM CHLORIDE ER 10 MEQ PO TBCR
10.0000 meq | EXTENDED_RELEASE_TABLET | Freq: Every day | ORAL | 3 refills | Status: DC
  Filled 2021-11-08: qty 90, 90d supply, fill #0

## 2021-11-08 MED ORDER — FUROSEMIDE 20 MG PO TABS
20.0000 mg | ORAL_TABLET | Freq: Every day | ORAL | 3 refills | Status: DC
  Filled 2021-11-08: qty 90, 90d supply, fill #0

## 2021-11-08 MED ORDER — CARVEDILOL 3.125 MG PO TABS
3.1250 mg | ORAL_TABLET | Freq: Two times a day (BID) | ORAL | 3 refills | Status: DC
  Filled 2021-11-08: qty 60, 30d supply, fill #0

## 2021-11-08 MED ORDER — LOSARTAN POTASSIUM 25 MG PO TABS
25.0000 mg | ORAL_TABLET | Freq: Every day | ORAL | 3 refills | Status: DC
  Filled 2021-11-08: qty 90, 90d supply, fill #0

## 2021-11-08 NOTE — Progress Notes (Signed)
? ?Cardiology Office Note ? ? ?Date:  11/08/2021  ? ?ID:  Rachael Stone, DOB 15-Oct-1965, MRN [id number] ? ?PCP:  Kerin Perna, NP  ?Cardiologist:  Dr. Acie Fredrickson   ? ?No chief complaint on file. ? ? ?  ?Notes from Rachael Stone: ?Rachael Stone is a 56 y.o. female who presents for post hospital for progressive dyspnea and orthopnea and has been found to have LV dysfunction (EF 35%), mod AI, and mod - sev MR. ? ?She has a  h/o tob, alcohol, marijuana, and remote cocaine abuse.  She has no prior cardiac hx.  She had CTA of chest without PE + pulmonary edema and LUL nodule of 67mm.  Echo with EF 30% and mod-sev MR and mod AI. Rachael Stone    ?She had R & L cardiac cath  ?LV end diastolic pressure is normal. ?There is moderate left ventricular systolic dysfunction. ?The left ventricular ejection fraction is 35% by visual estimate. ?There is no aortic valve stenosis. ?LV end diastolic pressure is normal ?Normal coronary arteries. ? ?She has done well no further SOB, swelling, which was in her abd or chest pain.  She has decreased cigarettes to 5 per day down from 10 per day.  Could not afford nicotene patches.  She has cut out salt in her diet.  Unfortunately she has not taken her meds for 2 days, she is worried they will hurt her heart.  We had a long discussion on what happened to her heart and the weakness of her ventricle. We discussed how important the meds were to improve her heart.   ? ? ?Feb.   2, 2018: ?Seen with her sister, Rachael Stone.  ? ?She stopped her meds when they ran out in Dec.   Says she felt better off the meds.  ? ?She says that she does not have any symptoms.   Does not want to restart meds.  ? Saw Rachael Stone who refilled her meds.  ?Patient did not Get her medications filled. She states that she does not understand why she needs to take these medications and does not want to take medications or her life. ? ?Nov 14, 2016: ? ?Rachael Stone is seen today with her sister, Rachael Stone.  ?No CP or dyspnea.    ?Donates  plasma twice a week.   ?BP is typically normal there ? ?Dec. 6,2018: ? ?[name] had an echocardiogram recently that revealed worsening systolic congestive heart failure and moderate to severe aortic insufficiency. ?Seen with her sister , Rachael Stone  ?No real increase in dyspnea.  ? ?September 11, 2017:   ?Rachael Stone is seen by her self today . ?Seems to be doing well  ? ?Has mild dyspnea.   ?Had a TEE  ?Her ejection fraction is now normal with an EF of 55-60%.  She has moderate aortic insufficiency, mild mitral regurgitation ?Her left ventricular function has improved and her aortic insufficiency and mitral regurgitation have both improved as well since her previous echo. ? ?Complains about having to urinate so much with the lasix  ? ?Dec. 8 2020 : ? ?Rachael Stone is seen today for follow-up of her congestive heart failure, mild mitral vegetation, hyperlipidemia.  She also has mild to moderate aortic insufficiency. ?Avoids salty foods. ? ? ?January 13, 2020;   Rachael Stone is seen today  ?Echo recently showed normal LV size and function ?Moderate -severe AI  ?No real symptoms.   Not much exercise  ? ?April 03, 2020: ?Rachael Stone  is seen today for follow-up of  her congestive heart failure, ?BP has been low ?Has a poor appetite,  Has had diarrhea for past week or 2.  ?Has seen GI.  Has submitted stool samples.  ? ?Breathing has been ok  ?Wt today is 130 lbs.  ( 19 lb weight loss in 9 months )  ?Drinks 4 beers + 2 shots of whiskey per night  ? ?November 06, 2020: ?Rachael Stone seen today for follow-up of her congestive heart failure.  When I saw her in September she was having lots of diarrhea and a poor appetite.  She had lost quite a bit of weight.  I did not think that she would tolerate restarting her heart failure medications at that time. ?Wt is 135 lbs ( up 5 lbs )  ?Still eating [employer] ( works there now)  ?Still drinks 4 beers and 2 shots of whisky at night ( most night )  ?We discussed that this amount of ETOH will cause poor appetite,  diarrhea, weight loss  ? ? ?October 31, 2021: ?Rachael Stone seen today for follow-up of her congestive heart failure, hypertension. ?Drinking less now ?2 beers and perhaps 1 shot of whiskey now.  ? ? ?Past Medical History:  ?Diagnosis Date  ? Back pain   ? Cardiomyopathy ([place])   ? a. 04/08/2016 Echo: EF 35%, diff HK, mild LVH, Gr2 DD, mod AI, mod to sev MR, mildly dil LA.  ? DEPRESSIVE DISORDER NOT ELSEWHERE CLASSIFIED 12/14/2008  ? Qualifier: Diagnosis of  By: [name], [place]    ? DYSFUNCTIONAL UTERINE BLEEDING 11/12/2007  ? Qualifier: Diagnosis of  By: [name], [place]    ? ETOH abuse   ? Hypertensive heart disease with heart failure ([place])   ? Marijuana abuse   ? MENORRHAGIA 12/14/2008  ? Qualifier: Diagnosis of  By: [name], [place]    ? Moderate aortic insufficiency   ? a. 04/08/2016 Echo: mod AI.  ? Moderate to Severe Mitral Regurgitation   ? a. 04/08/2016 Echo: mod-sev MR directed centrally.  ? Tobacco abuse   ? ? ?Past Surgical History:  ?Procedure Laterality Date  ? CARDIAC CATHETERIZATION N/A 04/10/2016  ? Procedure: Right/Left Heart Cath and Coronary Angiography;  Surgeon: [name], MD;  Location: [place] [facility];  Service: Cardiovascular;  Laterality: N/A;  ? EXPLORATORY LAPAROTOMY  2003  ? Ectopic Pregnancy - unsure which side or if tube was removed  ? TEE WITHOUT CARDIOVERSION N/A 06/26/2017  ? Procedure: TRANSESOPHAGEAL ECHOCARDIOGRAM (TEE);  Surgeon: [name], MD;  Location: [facility];  Service: Cardiovascular;  Laterality: N/A;  ? TUBAL LIGATION    ? ? ? ?Current Outpatient Medications  ?Medication Sig Dispense Refill  ? carvedilol (COREG) 3.125 MG tablet Take 1 tablet (3.125 mg total) by mouth 2 (two) times daily. 180 tablet 3  ? furosemide (LASIX) 20 MG tablet Take 1 tablet (20 mg total) by mouth daily. 90 tablet 3  ? potassium chloride (KLOR-CON) 10 MEQ tablet Take 1 tablet (10 mEq total) by mouth daily. 30 tablet 2  ? losartan (COZAAR) 25 MG tablet Take 1 tablet (25 mg total) by mouth daily.  (Patient not taking: Reported on 11/08/2021) 90 tablet 3  ? ?No current facility-administered medications for this visit.  ? ? ?Allergies:   Ibuprofen  ? ? ?Social History:  The patient  reports that she has been smoking cigarettes. She has a 7.50 pack-year smoking history. She has never used smokeless tobacco. She reports current alcohol use. She reports current drug use.  Drug: Marijuana.  ? ?Family History:  The patient's family history includes CAD in her mother; Cirrhosis in her father; Lupus in her sister.  ? ? ?ROS   - negative  ? ?Wt Readings from Last 3 Encounters:  ?11/08/21 122 lb (55.3 kg)  ?05/13/21 132 lb 6.4 oz (60.1 kg)  ?02/11/21 130 lb 12.8 oz (59.3 kg)  ?  ? ? ?Physical Exam: ?Blood pressure 138/78, pulse 94, height 5' 5.5" (1.664 m), weight 122 lb (55.3 kg), SpO2 99 %. ? ?GEN:  Well nourished, well developed in no acute distress ?HEENT: Normal ?NECK: No JVD; No carotid bruits ?LYMPHATICS: No lymphadenopathy ?CARDIAC: RRR , soft systolic and diastolic murmur. ?RESPIRATORY:  Clear to auscultation without rales, wheezing or rhonchi  ?ABDOMEN: Soft, non-tender, non-distended ?MUSCULOSKELETAL:  No edema; No deformity  ?SKIN: Warm and dry ?NEUROLOGIC:  Alert and oriented x 3 ? ? ?EKG:    ? ? ?Recent Labs: ?12/05/2020: B Natriuretic Peptide 223.5; Hemoglobin 14.3; Platelets 162 ?02/26/2021: BUN 6; Creatinine, Ser 0.70; Potassium 3.4; Sodium 143 ?05/13/2021: TSH 0.211  ? ? ?Lipid Panel ?   ?Component Value Date/Time  ? CHOL 162 04/25/2019 1045  ? TRIG 133 04/25/2019 1045  ? HDL 52 04/25/2019 1045  ? CHOLHDL 3.1 04/25/2019 1045  ? CHOLHDL 4.0 Ratio 11/12/2007 2044  ? VLDL 32 11/12/2007 2044  ? Wing 87 04/25/2019 1045  ? ?  ? ? ?Other studies Reviewed: ?Additional studies/ records that were reviewed today include: . ? ?Cardiac Cath 04/10/16 ?Conclusion  ? ?  ?LV end diastolic pressure is normal. ?There is moderate left ventricular systolic dysfunction. ?The left ventricular ejection fraction is 35% by visual  estimate. ?There is no aortic valve stenosis. ?LV end diastolic pressure is normal. ?  ?Nonischemic cardiomyopathy.  Continue medical therapy. ?   ?Indications  ? ?Acute combined systolic and diastolic heart fail

## 2021-11-08 NOTE — Patient Instructions (Signed)
Medication Instructions:  ? ?Your physician recommends that you continue on your current medications as directed. Please refer to the Current Medication list given to you today. ? ?*If you need a refill on your cardiac medications before your next appointment, please call your pharmacy* ? ? ?Testing/Procedures: ? ?Your physician has requested that you have an echocardiogram. Echocardiography is a painless test that uses sound waves to create images of your heart. It provides your doctor with information about the size and shape of your heart and how well your heart?s chambers and valves are working. This procedure takes approximately one hour. There are no restrictions for this procedure. ? ? ?Follow-Up: ?At [facility], you and your health needs are our priority.  As part of our continuing mission to provide you with exceptional heart care, we have created designated Provider Care Teams.  These Care Teams include your primary Cardiologist (physician) and Advanced Practice Providers (APPs -  Physician Assistants and Nurse Practitioners) who all work together to provide you with the care you need, when you need it. ? ?We recommend signing up for the patient portal called "MyChart".  Sign up information is provided on this After Visit Summary.  MyChart is used to connect with patients for Virtual Visits (Telemedicine).  Patients are able to view lab/test results, encounter notes, upcoming appointments, etc.  Non-urgent messages can be sent to your provider as well.   ?To learn more about what you can do with MyChart, go to [email or web address].   ? ?Your next appointment:   ?1 year(s) ? ?The format for your next appointment:   ?In Person ? ?Provider:   ?Richardson Dopp, PA-C     { ? ? ? ?Important Information About [facility] ? ? ? ? ? ? ?

## 2021-11-13 ENCOUNTER — Ambulatory Visit (HOSPITAL_COMMUNITY): Payer: Commercial Managed Care - HMO | Attending: Cardiovascular Disease

## 2021-11-13 DIAGNOSIS — I10 Essential (primary) hypertension: Secondary | ICD-10-CM | POA: Insufficient documentation

## 2021-11-13 DIAGNOSIS — I351 Nonrheumatic aortic (valve) insufficiency: Secondary | ICD-10-CM

## 2021-11-13 DIAGNOSIS — I700 Atherosclerosis of aorta: Secondary | ICD-10-CM | POA: Diagnosis not present

## 2021-11-13 DIAGNOSIS — F17200 Nicotine dependence, unspecified, uncomplicated: Secondary | ICD-10-CM | POA: Diagnosis not present

## 2021-11-13 DIAGNOSIS — F1010 Alcohol abuse, uncomplicated: Secondary | ICD-10-CM | POA: Diagnosis not present

## 2021-11-13 DIAGNOSIS — I429 Cardiomyopathy, unspecified: Secondary | ICD-10-CM | POA: Diagnosis not present

## 2021-11-13 LAB — ECHOCARDIOGRAM COMPLETE
Area-P 1/2: 3.64 cm2
P 1/2 time: 500 msec
S' Lateral: 3.8 cm

## 2021-11-15 ENCOUNTER — Telehealth: Payer: Self-pay

## 2021-11-15 ENCOUNTER — Other Ambulatory Visit: Payer: Self-pay

## 2021-11-15 MED ORDER — AMLODIPINE BESYLATE 5 MG PO TABS: 5.0000 mg | ORAL_TABLET | Freq: Every day | ORAL | 3 refills | Status: DC

## 2021-11-15 NOTE — Telephone Encounter (Signed)
Called and relayed results to patient. Sent Amlodipine into [facility] pharmacy per her request. She understands that Dr Acie Fredrickson will call her to review things further. ?

## 2021-11-15 NOTE — Telephone Encounter (Signed)
-----   Message from Thayer Headings, MD sent at 11/14/2021  9:23 AM EDT ----- ?Pt has severe aortic insufficiency. ?Please start amlodipine 5 mg a day  ?She has moderate LV dilatation  ?I will discuss this with her over the next several days.   She has not had any worsening symptoms but I will review these with her .   ?She will likely need a cardiac cath and possible aortic valve replacement  ?

## 2022-01-29 ENCOUNTER — Other Ambulatory Visit: Payer: Self-pay | Admitting: Internal Medicine

## 2022-01-29 DIAGNOSIS — R059 Cough, unspecified: Secondary | ICD-10-CM

## 2022-01-29 DIAGNOSIS — R0602 Shortness of breath: Secondary | ICD-10-CM

## 2022-02-27 ENCOUNTER — Ambulatory Visit
Admission: RE | Admit: 2022-02-27 | Discharge: 2022-02-27 | Disposition: A | Discharge status: Home / Self Care | Payer: Commercial Managed Care - HMO | Source: Ambulatory Visit | Attending: Internal Medicine | Admitting: Internal Medicine

## 2022-02-27 DIAGNOSIS — R0602 Shortness of breath: Secondary | ICD-10-CM

## 2022-02-27 DIAGNOSIS — R059 Cough, unspecified: Secondary | ICD-10-CM

## 2022-02-27 MED ORDER — IOPAMIDOL (ISOVUE-300) INJECTION 61%
75.0000 mL | Freq: Once | INTRAVENOUS | Status: AC | PRN
  Administered 2022-02-27: 75 mL via INTRAVENOUS

## 2022-03-14 ENCOUNTER — Other Ambulatory Visit: Payer: Self-pay | Admitting: Physician Assistant

## 2022-03-14 DIAGNOSIS — I351 Nonrheumatic aortic (valve) insufficiency: Secondary | ICD-10-CM

## 2022-03-20 ENCOUNTER — Institutional Professional Consult (permissible substitution): Payer: Commercial Managed Care - HMO | Admitting: Emergency Medicine

## 2022-03-31 ENCOUNTER — Ambulatory Visit (INDEPENDENT_AMBULATORY_CARE_PROVIDER_SITE_OTHER): Payer: Commercial Managed Care - HMO | Admitting: Pulmonary Disease

## 2022-03-31 ENCOUNTER — Encounter: Payer: Self-pay | Admitting: Pulmonary Disease

## 2022-03-31 VITALS — BP 120/70 | HR 103 | Ht 65.5 in | Wt 126.6 lb

## 2022-03-31 DIAGNOSIS — Z87891 Personal history of nicotine dependence: Secondary | ICD-10-CM

## 2022-03-31 DIAGNOSIS — R918 Other nonspecific abnormal finding of lung field: Secondary | ICD-10-CM | POA: Diagnosis not present

## 2022-03-31 DIAGNOSIS — R911 Solitary pulmonary nodule: Secondary | ICD-10-CM | POA: Diagnosis not present

## 2022-03-31 NOTE — Patient Instructions (Addendum)
Thank you for visiting Dr. Valeta Harms at [facility]. Today we recommend the following:  Orders Placed This Encounter  Procedures   Procedural/ Surgical Case Request: ROBOTIC ASSISTED NAVIGATIONAL BRONCHOSCOPY   NM PET Image Initial (PI) Skull Base To Thigh (F-18 FDG)   Ambulatory referral to Pulmonology   Bronchoscopy on 04/15/2022  Return in about 22 days (around 04/22/2022) for w/ Eric Form, NP .    Please do your part to reduce the spread of COVID-19.

## 2022-03-31 NOTE — Progress Notes (Signed)
Synopsis: Referred in September 2023 for abnormal CT chest by Kerin Perna, NP  Subjective:   PATIENT ID: Rachael Stone GENDER: female DOB: 25-Feb-1966, MRN: [id number]  Chief Complaint  Patient presents with   Consult    Lung nodule    This is a 56 year old female, past medical history of cardiomyopathy, alcohol use, hypertension, aortic insufficiency, moderate to severe mitral regurg history of tobacco use.Patient was referred after having CT scan of the chest completed on 02/27/2022.  This revealed a new lobulated lingular nodule measuring 1.5 x 1.1 cm suspicious for lung cancer.  Additionally there was a bilobed nodule versus 2 adjacent nodules in the left upper lobe posterior apex measuring 13 x 6 relatively unchanged from a 2017 exam.  Patient was referred here today to discuss abnormal CT imaging and next best steps. Reviewed the cts from 2022 the lung nodule was present on CT completed in the ED.      Past Medical History:  Diagnosis Date   Back pain    Cardiomyopathy ([place])    a. 04/08/2016 Echo: EF 35%, diff HK, mild LVH, Gr2 DD, mod AI, mod to sev MR, mildly dil LA.   DEPRESSIVE DISORDER NOT ELSEWHERE CLASSIFIED 12/14/2008   Qualifier: Diagnosis of  By: [name], [name]     DYSFUNCTIONAL UTERINE BLEEDING 11/12/2007   Qualifier: Diagnosis of  By: [name], [name]     ETOH abuse    Hypertensive heart disease with heart failure ([place])    Marijuana abuse    MENORRHAGIA 12/14/2008   Qualifier: Diagnosis of  By: [name], [name]     Moderate aortic insufficiency    a. 04/08/2016 Echo: mod AI.   Moderate to Severe Mitral Regurgitation    a. 04/08/2016 Echo: mod-sev MR directed centrally.   Tobacco abuse      Family History  Problem Relation Age of Onset   CAD Mother        First MI @ 97 - 48 total.   Cirrhosis Father        alcoholic - died in his 22'W.   Lupus Sister    Colon cancer Neg Hx    Colon polyps Neg Hx      Past Surgical History:  Procedure Laterality Date    CARDIAC CATHETERIZATION N/A 04/10/2016   Procedure: Right/Left Heart Cath and Coronary Angiography;  Surgeon: [name], MD;  Location: [place] [facility];  Service: Cardiovascular;  Laterality: N/A;   EXPLORATORY LAPAROTOMY  2003   Ectopic Pregnancy - unsure which side or if tube was removed   TEE WITHOUT CARDIOVERSION N/A 06/26/2017   Procedure: TRANSESOPHAGEAL ECHOCARDIOGRAM (TEE);  Surgeon: [name], MD;  Location: [facility];  Service: Cardiovascular;  Laterality: N/A;   TUBAL LIGATION      Social History   Socioeconomic History   Marital status: Single    Spouse name: Not on file   Number of children: Not on file   Years of education: Not on file   Highest education level: Not on file  Occupational History   Occupation: Cook  Tobacco Use   Smoking status: Every Day    Packs/day: 0.50    Years: 15.00    Total pack years: 7.50    Types: Cigarettes   Smokeless tobacco: Never   Tobacco comments:    4 packs a cigarettes a week. 03/31/2022 Tay.  Vaping Use   Vaping Use: Never used  Substance and Sexual Activity   Alcohol use: Yes  Comment: At least 12 ounces of homemade moon shine every other day.   Drug use: Yes    Types: Marijuana    Comment: Smokes marijuana daily.  Used to smoke cocaine laced  marijuana cigarettes for 15-20 yrs but quit cocaine 6 yrs ago.   Sexual activity: Yes    Birth control/protection: Other-see comments    Comment: BTL  Other Topics Concern   Not on file  Social History Narrative   Lives in [place] with her fiance.  Does not routinely exercise.   Social Determinants of Health   Financial Resource Strain: Not on file  Food Insecurity: Not on file  Transportation Needs: Not on file  Physical Activity: Not on file  Stress: Not on file  Social Connections: Not on file  Intimate Partner Violence: Not on file     Allergies  Allergen Reactions   Ibuprofen Other (See Comments)    Irritates stomach     Outpatient  Medications Prior to Visit  Medication Sig Dispense Refill   amLODipine (NORVASC) 5 MG tablet Take 1 tablet (5 mg total) by mouth daily. 90 tablet 3   carvedilol (COREG) 3.125 MG tablet Take 1 tablet (3.125 mg total) by mouth 2 (two) times daily. 180 tablet 3   furosemide (LASIX) 20 MG tablet Take 1 tablet (20 mg total) by mouth daily. 90 tablet 3   losartan (COZAAR) 25 MG tablet Take 1 tablet by mouth once daily 30 tablet 7   potassium chloride (KLOR-CON) 10 MEQ tablet Take 1 tablet (10 mEq total) by mouth daily. 90 tablet 3   No facility-administered medications prior to visit.    Review of Systems  Constitutional:  Negative for chills, fever, malaise/fatigue and weight loss.  HENT:  Negative for hearing loss, sore throat and tinnitus.   Eyes:  Negative for blurred vision and double vision.  Respiratory:  Positive for sputum production and shortness of breath. Negative for cough, hemoptysis, wheezing and stridor.   Cardiovascular:  Negative for chest pain, palpitations, orthopnea, leg swelling and PND.  Gastrointestinal:  Negative for abdominal pain, constipation, diarrhea, heartburn, nausea and vomiting.  Genitourinary:  Negative for dysuria, hematuria and urgency.  Musculoskeletal:  Negative for joint pain and myalgias.  Skin:  Negative for itching and rash.  Neurological:  Negative for dizziness, tingling, weakness and headaches.  Endo/Heme/Allergies:  Negative for environmental allergies. Does not bruise/bleed easily.  Psychiatric/Behavioral:  Negative for depression. The patient is not nervous/anxious and does not have insomnia.   All other systems reviewed and are negative.    Objective:  Physical Exam Vitals reviewed.  Constitutional:      General: She is not in acute distress.    Appearance: She is well-developed.  HENT:     Head: Normocephalic and atraumatic.  Eyes:     General: No scleral icterus.    Conjunctiva/sclera: Conjunctivae normal.     Pupils: Pupils are  equal, round, and reactive to light.  Neck:     Vascular: No JVD.     Trachea: No tracheal deviation.  Cardiovascular:     Rate and Rhythm: Normal rate and regular rhythm.     Heart sounds: Normal heart sounds. No murmur heard. Pulmonary:     Effort: Pulmonary effort is normal. No tachypnea, accessory muscle usage or respiratory distress.     Breath sounds: No stridor. No wheezing, rhonchi or rales.     Comments: Diminshed breath sounds bilaterally  Abdominal:     General: There is no distension.  Palpations: Abdomen is soft.     Tenderness: There is no abdominal tenderness.  Musculoskeletal:        General: No tenderness.     Cervical back: Neck supple.  Lymphadenopathy:     Cervical: No cervical adenopathy.  Skin:    General: Skin is warm and dry.     Capillary Refill: Capillary refill takes less than 2 seconds.     Findings: No rash.  Neurological:     Mental Status: She is alert and oriented to person, place, and time.  Psychiatric:        Behavior: Behavior normal.      Vitals:   03/31/22 0927  BP: 120/70  Pulse: (!) 103  SpO2: 96%  Weight: 126 lb 9.6 oz (57.4 kg)  Height: 5' 5.5" (1.664 m)   96% on RA BMI Readings from Last 3 Encounters:  03/31/22 20.75 kg/m  11/08/21 19.99 kg/m  05/13/21 21.70 kg/m   Wt Readings from Last 3 Encounters:  03/31/22 126 lb 9.6 oz (57.4 kg)  11/08/21 122 lb (55.3 kg)  05/13/21 132 lb 6.4 oz (60.1 kg)     CBC    Component Value Date/Time   WBC 5.3 12/05/2020 0655   RBC 4.76 12/05/2020 0655   HGB 14.3 12/05/2020 0655   HGB 12.7 11/29/2018 0914   HCT 43.5 12/05/2020 0655   HCT 37.7 11/29/2018 0914   PLT 162 12/05/2020 0655   PLT 175 11/29/2018 0914   MCV 91.4 12/05/2020 0655   MCV 87 11/29/2018 0914   MCH 30.0 12/05/2020 0655   MCHC 32.9 12/05/2020 0655   RDW 14.9 12/05/2020 0655   RDW 14.8 11/29/2018 0914   LYMPHSABS 2.2 03/19/2019 1630   LYMPHSABS 1.5 11/29/2018 0914   MONOABS 0.7 03/19/2019 1630    EOSABS 0.2 03/19/2019 1630   EOSABS 0.1 11/29/2018 0914   BASOSABS 0.0 03/19/2019 1630   BASOSABS 0.0 11/29/2018 0914     Chest Imaging: August 2023: CT chest, lingular lung nodule 1.5 cm, left upper lobe posterior segment with another small 13 mm nodule. The patient's images have been independently reviewed by me.    Pulmonary Functions Testing Results:     No data to display          FeNO:   Pathology:   Echocardiogram:   Heart Catheterization:     Assessment & Plan:     ICD-10-CM   1. Nodule of upper lobe of left lung  R91.1 NM PET Image Initial (PI) Skull Base To Thigh (F-18 FDG)    Ambulatory referral to Pulmonology    Procedural/ Surgical Case Request: ROBOTIC ASSISTED NAVIGATIONAL BRONCHOSCOPY    2. Multiple pulmonary nodules  R91.8     3. History of tobacco use  Z87.891     4. Lung nodule  R91.1 Novel Coronavirus, NAA ([facility])      Discussion:  This is a 56 year old female, new found left lingular pulmonary nodule.  Patient has a history of tobacco use, current smoker.  Initial lung nodule was actually found on the 2022 May CT image.  She had follow-up by her primary care provider that shows progressive enlargement of the lung nodule.  Plan: Today in the office we discussed her CT imaging.  I am concerned with growth of the nodule that we are dealing with a primary bronchogenic carcinoma. We will get a nuclear medicine PET scan. Tentative bronchoscopy date on 04/15/2022. Patient is agreeable to proceed with robotic assisted navigational bronchoscopy.  We talked the  risk of bleeding and pneumothorax. She does have a friend that we will be able to bring her to the hospital and bring her home.    Current Outpatient Medications:    amLODipine (NORVASC) 5 MG tablet, Take 1 tablet (5 mg total) by mouth daily., Disp: 90 tablet, Rfl: 3   carvedilol (COREG) 3.125 MG tablet, Take 1 tablet (3.125 mg total) by mouth 2 (two) times daily., Disp: 180 tablet, Rfl:  3   furosemide (LASIX) 20 MG tablet, Take 1 tablet (20 mg total) by mouth daily., Disp: 90 tablet, Rfl: 3   losartan (COZAAR) 25 MG tablet, Take 1 tablet by mouth once daily, Disp: 30 tablet, Rfl: 7   potassium chloride (KLOR-CON) 10 MEQ tablet, Take 1 tablet (10 mEq total) by mouth daily., Disp: 90 tablet, Rfl: 3  I spent 62 minutes dedicated to the care of this patient on the date of this encounter to include pre-visit review of records, face-to-face time with the patient discussing conditions above, post visit ordering of testing, clinical documentation with the electronic health record, making appropriate referrals as documented, and communicating necessary findings to members of the patients care team.   [name], DO [facility] 03/31/2022 10:18 AM

## 2022-03-31 NOTE — H&P (View-Only) (Signed)
Synopsis: Referred in September 2023 for abnormal CT chest by Kerin Perna, NP  Subjective:   PATIENT ID: Rachael Stone GENDER: female DOB: Mar 28, 1966, MRN: [id number]  Chief Complaint  Patient presents with   Consult    Lung nodule    This is a 56 year old female, past medical history of cardiomyopathy, alcohol use, hypertension, aortic insufficiency, moderate to severe mitral regurg history of tobacco use.Patient was referred after having CT scan of the chest completed on 02/27/2022.  This revealed a new lobulated lingular nodule measuring 1.5 x 1.1 cm suspicious for lung cancer.  Additionally there was a bilobed nodule versus 2 adjacent nodules in the left upper lobe posterior apex measuring 13 x 6 relatively unchanged from a 2017 exam.  Patient was referred here today to discuss abnormal CT imaging and next best steps. Reviewed the cts from 2022 the lung nodule was present on CT completed in the ED.      Past Medical History:  Diagnosis Date   Back pain    Cardiomyopathy ([place])    a. 04/08/2016 Echo: EF 35%, diff HK, mild LVH, Gr2 DD, mod AI, mod to sev MR, mildly dil LA.   DEPRESSIVE DISORDER NOT ELSEWHERE CLASSIFIED 12/14/2008   Qualifier: Diagnosis of  By: [name], [name]     DYSFUNCTIONAL UTERINE BLEEDING 11/12/2007   Qualifier: Diagnosis of  By: [name], [name]     ETOH abuse    Hypertensive heart disease with heart failure ([place])    Marijuana abuse    MENORRHAGIA 12/14/2008   Qualifier: Diagnosis of  By: [name], [name]     Moderate aortic insufficiency    a. 04/08/2016 Echo: mod AI.   Moderate to Severe Mitral Regurgitation    a. 04/08/2016 Echo: mod-sev MR directed centrally.   Tobacco abuse      Family History  Problem Relation Age of Onset   CAD Mother        First MI @ 20 - 72 total.   Cirrhosis Father        alcoholic - died in his 22'L.   Lupus Sister    Colon cancer Neg Hx    Colon polyps Neg Hx      Past Surgical History:  Procedure Laterality Date    CARDIAC CATHETERIZATION N/A 04/10/2016   Procedure: Right/Left Heart Cath and Coronary Angiography;  Surgeon: [name], MD;  Location: [place] [facility];  Service: Cardiovascular;  Laterality: N/A;   EXPLORATORY LAPAROTOMY  2003   Ectopic Pregnancy - unsure which side or if tube was removed   TEE WITHOUT CARDIOVERSION N/A 06/26/2017   Procedure: TRANSESOPHAGEAL ECHOCARDIOGRAM (TEE);  Surgeon: [name], MD;  Location: [facility] ENDOSCOPY;  Service: Cardiovascular;  Laterality: N/A;   TUBAL LIGATION      Social History   Socioeconomic History   Marital status: Single    Spouse name: Not on file   Number of children: Not on file   Years of education: Not on file   Highest education level: Not on file  Occupational History   Occupation: Cook  Tobacco Use   Smoking status: Every Day    Packs/day: 0.50    Years: 15.00    Total pack years: 7.50    Types: Cigarettes   Smokeless tobacco: Never   Tobacco comments:    4 packs a cigarettes a week. 03/31/2022 Tay.  Vaping Use   Vaping Use: Never used  Substance and Sexual Activity   Alcohol use: Yes  Comment: At least 12 ounces of homemade moon shine every other day.   Drug use: Yes    Types: Marijuana    Comment: Smokes marijuana daily.  Used to smoke cocaine laced  marijuana cigarettes for 15-20 yrs but quit cocaine 6 yrs ago.   Sexual activity: Yes    Birth control/protection: Other-see comments    Comment: BTL  Other Topics Concern   Not on file  Social History Narrative   Lives in [place] with her fiance.  Does not routinely exercise.   Social Determinants of Health   Financial Resource Strain: Not on file  Food Insecurity: Not on file  Transportation Needs: Not on file  Physical Activity: Not on file  Stress: Not on file  Social Connections: Not on file  Intimate Partner Violence: Not on file     Allergies  Allergen Reactions   Ibuprofen Other (See Comments)    Irritates stomach     Outpatient  Medications Prior to Visit  Medication Sig Dispense Refill   amLODipine (NORVASC) 5 MG tablet Take 1 tablet (5 mg total) by mouth daily. 90 tablet 3   carvedilol (COREG) 3.125 MG tablet Take 1 tablet (3.125 mg total) by mouth 2 (two) times daily. 180 tablet 3   furosemide (LASIX) 20 MG tablet Take 1 tablet (20 mg total) by mouth daily. 90 tablet 3   losartan (COZAAR) 25 MG tablet Take 1 tablet by mouth once daily 30 tablet 7   potassium chloride (KLOR-CON) 10 MEQ tablet Take 1 tablet (10 mEq total) by mouth daily. 90 tablet 3   No facility-administered medications prior to visit.    Review of Systems  Constitutional:  Negative for chills, fever, malaise/fatigue and weight loss.  HENT:  Negative for hearing loss, sore throat and tinnitus.   Eyes:  Negative for blurred vision and double vision.  Respiratory:  Positive for sputum production and shortness of breath. Negative for cough, hemoptysis, wheezing and stridor.   Cardiovascular:  Negative for chest pain, palpitations, orthopnea, leg swelling and PND.  Gastrointestinal:  Negative for abdominal pain, constipation, diarrhea, heartburn, nausea and vomiting.  Genitourinary:  Negative for dysuria, hematuria and urgency.  Musculoskeletal:  Negative for joint pain and myalgias.  Skin:  Negative for itching and rash.  Neurological:  Negative for dizziness, tingling, weakness and headaches.  Endo/Heme/Allergies:  Negative for environmental allergies. Does not bruise/bleed easily.  Psychiatric/Behavioral:  Negative for depression. The patient is not nervous/anxious and does not have insomnia.   All other systems reviewed and are negative.    Objective:  Physical Exam Vitals reviewed.  Constitutional:      General: She is not in acute distress.    Appearance: She is well-developed.  HENT:     Head: Normocephalic and atraumatic.  Eyes:     General: No scleral icterus.    Conjunctiva/sclera: Conjunctivae normal.     Pupils: Pupils are  equal, round, and reactive to light.  Neck:     Vascular: No JVD.     Trachea: No tracheal deviation.  Cardiovascular:     Rate and Rhythm: Normal rate and regular rhythm.     Heart sounds: Normal heart sounds. No murmur heard. Pulmonary:     Effort: Pulmonary effort is normal. No tachypnea, accessory muscle usage or respiratory distress.     Breath sounds: No stridor. No wheezing, rhonchi or rales.     Comments: Diminshed breath sounds bilaterally  Abdominal:     General: There is no distension.  Palpations: Abdomen is soft.     Tenderness: There is no abdominal tenderness.  Musculoskeletal:        General: No tenderness.     Cervical back: Neck supple.  Lymphadenopathy:     Cervical: No cervical adenopathy.  Skin:    General: Skin is warm and dry.     Capillary Refill: Capillary refill takes less than 2 seconds.     Findings: No rash.  Neurological:     Mental Status: She is alert and oriented to person, place, and time.  Psychiatric:        Behavior: Behavior normal.      Vitals:   03/31/22 0927  BP: 120/70  Pulse: (!) 103  SpO2: 96%  Weight: 126 lb 9.6 oz (57.4 kg)  Height: 5' 5.5" (1.664 m)   96% on RA BMI Readings from Last 3 Encounters:  03/31/22 20.75 kg/m  11/08/21 19.99 kg/m  05/13/21 21.70 kg/m   Wt Readings from Last 3 Encounters:  03/31/22 126 lb 9.6 oz (57.4 kg)  11/08/21 122 lb (55.3 kg)  05/13/21 132 lb 6.4 oz (60.1 kg)     CBC    Component Value Date/Time   WBC 5.3 12/05/2020 0655   RBC 4.76 12/05/2020 0655   HGB 14.3 12/05/2020 0655   HGB 12.7 11/29/2018 0914   HCT 43.5 12/05/2020 0655   HCT 37.7 11/29/2018 0914   PLT 162 12/05/2020 0655   PLT 175 11/29/2018 0914   MCV 91.4 12/05/2020 0655   MCV 87 11/29/2018 0914   MCH 30.0 12/05/2020 0655   MCHC 32.9 12/05/2020 0655   RDW 14.9 12/05/2020 0655   RDW 14.8 11/29/2018 0914   LYMPHSABS 2.2 03/19/2019 1630   LYMPHSABS 1.5 11/29/2018 0914   MONOABS 0.7 03/19/2019 1630    EOSABS 0.2 03/19/2019 1630   EOSABS 0.1 11/29/2018 0914   BASOSABS 0.0 03/19/2019 1630   BASOSABS 0.0 11/29/2018 0914     Chest Imaging: August 2023: CT chest, lingular lung nodule 1.5 cm, left upper lobe posterior segment with another small 13 mm nodule. The patient's images have been independently reviewed by me.    Pulmonary Functions Testing Results:     No data to display          FeNO:   Pathology:   Echocardiogram:   Heart Catheterization:     Assessment & Plan:     ICD-10-CM   1. Nodule of upper lobe of left lung  R91.1 NM PET Image Initial (PI) Skull Base To Thigh (F-18 FDG)    Ambulatory referral to Pulmonology    Procedural/ Surgical Case Request: ROBOTIC ASSISTED NAVIGATIONAL BRONCHOSCOPY    2. Multiple pulmonary nodules  R91.8     3. History of tobacco use  Z87.891     4. Lung nodule  R91.1 Novel Coronavirus, NAA ([facility])      Discussion:  This is a 56 year old female, new found left lingular pulmonary nodule.  Patient has a history of tobacco use, current smoker.  Initial lung nodule was actually found on the 2022 May CT image.  She had follow-up by her primary care provider that shows progressive enlargement of the lung nodule.  Plan: Today in the office we discussed her CT imaging.  I am concerned with growth of the nodule that we are dealing with a primary bronchogenic carcinoma. We will get a nuclear medicine PET scan. Tentative bronchoscopy date on 04/15/2022. Patient is agreeable to proceed with robotic assisted navigational bronchoscopy.  We talked the  risk of bleeding and pneumothorax. She does have a friend that we will be able to bring her to the hospital and bring her home.    Current Outpatient Medications:    amLODipine (NORVASC) 5 MG tablet, Take 1 tablet (5 mg total) by mouth daily., Disp: 90 tablet, Rfl: 3   carvedilol (COREG) 3.125 MG tablet, Take 1 tablet (3.125 mg total) by mouth 2 (two) times daily., Disp: 180 tablet, Rfl:  3   furosemide (LASIX) 20 MG tablet, Take 1 tablet (20 mg total) by mouth daily., Disp: 90 tablet, Rfl: 3   losartan (COZAAR) 25 MG tablet, Take 1 tablet by mouth once daily, Disp: 30 tablet, Rfl: 7   potassium chloride (KLOR-CON) 10 MEQ tablet, Take 1 tablet (10 mEq total) by mouth daily., Disp: 90 tablet, Rfl: 3  I spent 62 minutes dedicated to the care of this patient on the date of this encounter to include pre-visit review of records, face-to-face time with the patient discussing conditions above, post visit ordering of testing, clinical documentation with the electronic health record, making appropriate referrals as documented, and communicating necessary findings to members of the patients care team.   [name], DO [facility] 03/31/2022 10:18 AM

## 2022-04-10 ENCOUNTER — Telehealth: Payer: Self-pay | Admitting: Pulmonary Disease

## 2022-04-10 ENCOUNTER — Ambulatory Visit (HOSPITAL_COMMUNITY)
Admission: RE | Admit: 2022-04-10 | Discharge: 2022-04-10 | Disposition: A | Discharge status: Home / Self Care | Payer: Commercial Managed Care - HMO | Source: Ambulatory Visit | Attending: Pulmonary Disease | Admitting: Pulmonary Disease

## 2022-04-10 DIAGNOSIS — R911 Solitary pulmonary nodule: Secondary | ICD-10-CM | POA: Diagnosis not present

## 2022-04-10 LAB — GLUCOSE, CAPILLARY: Glucose-Capillary: 79 mg/dL (ref 70–99)

## 2022-04-10 MED ORDER — FLUDEOXYGLUCOSE F - 18 (FDG) INJECTION
6.3000 | Freq: Once | INTRAVENOUS | Status: AC | PRN
  Administered 2022-04-10: 6.3 via INTRAVENOUS

## 2022-04-10 NOTE — Telephone Encounter (Signed)
Patient is returning phone call. Patient phone number is [phone].

## 2022-04-10 NOTE — Telephone Encounter (Signed)
Spoke with pt and she will be there at 11 on 04/15/22. Nothing further needed at time.

## 2022-04-10 NOTE — Progress Notes (Signed)
Spoke to [employer]. Patient unable to make it to her Covid appt and wants to take her Covid test on day of surgery.  I told them she would need to arrive at 1130 day of surgery.

## 2022-04-10 NOTE — Telephone Encounter (Signed)
ATC LVMTCB   PCC's can the pt get a rapid Covid test on the day of the procedure 04/15/22? What time would pt have to arrive? Thank you

## 2022-04-11 ENCOUNTER — Encounter (HOSPITAL_COMMUNITY): Payer: Self-pay | Admitting: Pulmonary Disease

## 2022-04-14 ENCOUNTER — Other Ambulatory Visit: Payer: Self-pay

## 2022-04-14 ENCOUNTER — Encounter (HOSPITAL_COMMUNITY): Payer: Self-pay | Admitting: Pulmonary Disease

## 2022-04-14 NOTE — Anesthesia Preprocedure Evaluation (Addendum)
Anesthesia Evaluation  Patient identified by MRN, date of birth, ID band Patient awake    Reviewed: Allergy & Precautions, H&P , NPO status , Patient's Chart, lab work & pertinent test results  Airway Mallampati: II   Neck ROM: full    Dental   Pulmonary Current Smoker,    breath sounds clear to auscultation       Cardiovascular hypertension, + Valvular Problems/Murmurs AI  Rhythm:regular Rate:Normal  TTE (11/2021): severe AI.  EF 55%   Neuro/Psych PSYCHIATRIC DISORDERS Depression    GI/Hepatic GERD  ,  Endo/Other    Renal/GU      Musculoskeletal   Abdominal   Peds  Hematology   Anesthesia Other Findings   Reproductive/Obstetrics                            Anesthesia Physical Anesthesia Plan  ASA: 3  Anesthesia Plan: General   Post-op Pain Management:    Induction: Intravenous  PONV Risk Score and Plan: 2 and Ondansetron, Dexamethasone, Treatment may vary due to age or medical condition and Midazolam  Airway Management Planned: Oral ETT  Additional Equipment:   Intra-op Plan:   Post-operative Plan: Extubation in OR  Informed Consent: I have reviewed the patients History and Physical, chart, labs and discussed the procedure including the risks, benefits and alternatives for the proposed anesthesia with the patient or authorized representative who has indicated his/her understanding and acceptance.     Dental advisory given  Plan Discussed with: CRNA, Anesthesiologist and Surgeon  Anesthesia Plan Comments: (PAT note by Karoline Caldwell, PA-C: Patient follows with cardiology for history of systolic heart failure with recovered EF, hypertension, severe aortic insufficiency, normal coronaries by cath.  Echo 11/13/2021 showed EF 55%, normal wall motion, moderately dilated left ventricular internal cavity, normal RV function, severe aortic regurgitation, trivial mitral regurgitation.  Dr.  Acie Fredrickson commented that the patient may need cardiac cath and consideration of aortic valve replacement future.  I spoke with Dr. Valeta Harms regarding patient's cardiac history.  He reached out directly to Dr. Acie Fredrickson who advised the patient is okay to proceed with this procedure from a cardiac standpoint without further testing. Per [place], Dr. Acie Fredrickson stated, "I last saw Mr. Scibilia November 08, 2021.  she has had moderate - severe aortic insufficiency for several years and has been clinically stable.  She was recently seen by Dr. Valeta Harms and was clinically stable at that time .   Regurgitant valvular lesions are generally well tolerated during anesthesia / surgical procedures .  She is at moderate risk for her upcoming bronchoscopy / biopsy.   The cardiology team will be available for consultation if needed."  Patient is a current smoker.  CT chest 02/27/2022 showed new lobulated lingular nodule measuring 1.5 x 1.1 cm suspicious for lung cancer.  History of EtOH abuse.  Pt will need DOS labs and evaluation.  EKG 02/11/2021: Sinus tach.  Rate 115.  Inferior lateral T wave inversions.  Similar to prior EKGs.  CT Chest 02/27/22: IMPRESSION: 1. Lobulated lingular nodule measuring 1.5 x 1.1 cm, new from 2021 exam. This nodule abuts the left heart border and is suspicious for primary bronchogenic malignancy. Recommend PET-CT characterization. 2. Bilobed nodule versus 2 adjacent nodules in the left upper lobe posterior apex measuring up to 13 x 6 mm, unchanged in size from prior exam, and only minimally increased in size from 2017 exam. Favor benign process. 3. Sub solid 3 mm right  upper lobe nodule, nonspecific. 4. Moderate emphysema. 5. Coronary artery calcifications. 6. Hepatic steatosis.  TTE 11/13/2021: 1. Left ventricular ejection fraction, by estimation, is 55%. The left  ventricle has normal function. The left ventricle has no regional wall  motion abnormalities. The left ventricular internal cavity  size was  moderately dilated. Left ventricular  diastolic parameters were normal.  2. Right ventricular systolic function is normal. The right ventricular  size is normal. There is moderately elevated pulmonary artery systolic  pressure.  3. The mitral valve is abnormal. Trivial mitral valve regurgitation. No  evidence of mitral stenosis.  4. Severe appearing AR by color flow with LVE PT1/2 367 msec and  holodiastolic reversals in descending thoracic aorta. . The aortic valve  is tricuspid. There is mild calcification of the aortic valve. There is  mild thickening of the aortic valve. Aortic  valve regurgitation is severe. Aortic valve sclerosis is present, with no  evidence of aortic valve stenosis.  5. The inferior vena cava is dilated in size with >50% respiratory  variability, suggesting right atrial pressure of 8 mmHg.   Right/left heart cath 04/10/2016: . LV end diastolic pressure is normal. . There is moderate left ventricular systolic dysfunction. . The left ventricular ejection fraction is 35% by visual estimate. . There is no aortic valve stenosis. . LV end diastolic pressure is normal.  Nonischemic cardiomyopathy. Continue medical therapy.   )     Anesthesia Quick Evaluation

## 2022-04-14 NOTE — Progress Notes (Signed)
PCP - Dr Iona Beard Cardiologist - Dr Cleatrice Burke  CT Chest x-ray - 02/28/22 EKG - DOS Stress Test - n/a ECHO - 11/13/21 Cardiac Cath - 04/10/16  ICD Pacemaker/Loop - n/a  Sleep Study -  n/a CPAP - none  Anesthesia review: Yes  STOP now taking any Aspirin (unless otherwise instructed by your surgeon), Aleve, Naproxen, Ibuprofen, Motrin, Advil, Goody's, BC's, all herbal medications, fish oil, and all vitamins.   Coronavirus Screening Covid test on DOS Do you have any of the following symptoms:  Cough yes/no: No Fever (>100.8F)  yes/no: No Runny nose yes/no: No Sore throat yes/no: No Difficulty breathing/shortness of breath    Have you traveled in the last 14 days and where? Yes  Patient verbalized understanding of instructions that were given via phone.

## 2022-04-14 NOTE — Progress Notes (Addendum)
Anesthesia Chart Review:  Patient follows with cardiology for history of systolic heart failure with recovered EF, hypertension, severe aortic insufficiency, normal coronaries by cath.  Echo 11/13/2021 showed EF 55%, normal wall motion, moderately dilated left ventricular internal cavity, normal RV function, severe aortic regurgitation, trivial mitral regurgitation.  Dr. Acie Fredrickson commented that the patient may need cardiac cath and consideration of aortic valve replacement future.  I spoke with Dr. Valeta Harms regarding patient's cardiac history.  He reached out directly to Dr. Acie Fredrickson who advised the patient is okay to proceed with this procedure from a cardiac standpoint without further testing. Per [place], Dr. Acie Fredrickson stated, "I last saw Mr. Yeung November 08, 2021.  she has had moderate - severe aortic insufficiency for several years and has been clinically stable.  She was recently seen by Dr. Valeta Harms and was clinically stable at that time .   Regurgitant valvular lesions are generally well tolerated during anesthesia / surgical procedures .  She is at moderate risk for her upcoming bronchoscopy / biopsy.   The cardiology team will be available for consultation if needed."  Patient is a current smoker.  CT chest 02/27/2022 showed new lobulated lingular nodule measuring 1.5 x 1.1 cm suspicious for lung cancer.  History of EtOH abuse.  Pt will need DOS labs and evaluation.  EKG 02/11/2021: Sinus tach.  Rate 115.  Inferior lateral T wave inversions.  Similar to prior EKGs.  CT Chest 02/27/22: IMPRESSION: 1. Lobulated lingular nodule measuring 1.5 x 1.1 cm, new from 2021 exam. This nodule abuts the left heart border and is suspicious for primary bronchogenic malignancy. Recommend PET-CT characterization. 2. Bilobed nodule versus 2 adjacent nodules in the left upper lobe posterior apex measuring up to 13 x 6 mm, unchanged in size from prior exam, and only minimally increased in size from 2017 exam. Favor  benign process. 3. Sub solid 3 mm right upper lobe nodule, nonspecific. 4. Moderate emphysema. 5. Coronary artery calcifications. 6. Hepatic steatosis.  TTE 11/13/2021:  1. Left ventricular ejection fraction, by estimation, is 55%. The left  ventricle has normal function. The left ventricle has no regional wall  motion abnormalities. The left ventricular internal cavity size was  moderately dilated. Left ventricular  diastolic parameters were normal.   2. Right ventricular systolic function is normal. The right ventricular  size is normal. There is moderately elevated pulmonary artery systolic  pressure.   3. The mitral valve is abnormal. Trivial mitral valve regurgitation. No  evidence of mitral stenosis.   4. Severe appearing AR by color flow with LVE PT1/2 367 msec and  holodiastolic reversals in descending thoracic aorta. . The aortic valve  is tricuspid. There is mild calcification of the aortic valve. There is  mild thickening of the aortic valve. Aortic   valve regurgitation is severe. Aortic valve sclerosis is present, with no  evidence of aortic valve stenosis.   5. The inferior vena cava is dilated in size with >50% respiratory  variability, suggesting right atrial pressure of 8 mmHg.   Right/left heart cath 3/87/5643: LV end diastolic pressure is normal. There is moderate left ventricular systolic dysfunction. The left ventricular ejection fraction is 35% by visual estimate. There is no aortic valve stenosis. LV end diastolic pressure is normal.   Nonischemic cardiomyopathy.  Continue medical therapy.    Wynonia Musty [facility] Short Stay [facility]/Anesthesiology Phone [phone] 04/14/2022 4:08 PM

## 2022-04-15 ENCOUNTER — Ambulatory Visit (HOSPITAL_BASED_OUTPATIENT_CLINIC_OR_DEPARTMENT_OTHER): Payer: Commercial Managed Care - HMO | Admitting: Physician Assistant

## 2022-04-15 ENCOUNTER — Ambulatory Visit (HOSPITAL_COMMUNITY): Payer: Commercial Managed Care - HMO

## 2022-04-15 ENCOUNTER — Ambulatory Visit (HOSPITAL_COMMUNITY)
Admission: RE | Admit: 2022-04-15 | Discharge: 2022-04-15 | Disposition: A | Discharge status: Home / Self Care | Payer: Commercial Managed Care - HMO | Source: Ambulatory Visit | Attending: Pulmonary Disease | Admitting: Pulmonary Disease

## 2022-04-15 ENCOUNTER — Other Ambulatory Visit: Payer: Self-pay

## 2022-04-15 ENCOUNTER — Encounter (HOSPITAL_COMMUNITY)
Admission: RE | Disposition: A | Discharge status: Home / Self Care | Payer: Self-pay | Source: Ambulatory Visit | Attending: Pulmonary Disease

## 2022-04-15 ENCOUNTER — Ambulatory Visit (HOSPITAL_COMMUNITY): Payer: Commercial Managed Care - HMO | Admitting: Physician Assistant

## 2022-04-15 DIAGNOSIS — R911 Solitary pulmonary nodule: Secondary | ICD-10-CM | POA: Diagnosis not present

## 2022-04-15 DIAGNOSIS — Z1152 Encounter for screening for COVID-19: Secondary | ICD-10-CM | POA: Insufficient documentation

## 2022-04-15 DIAGNOSIS — C3492 Malignant neoplasm of unspecified part of left bronchus or lung: Secondary | ICD-10-CM

## 2022-04-15 DIAGNOSIS — Z79899 Other long term (current) drug therapy: Secondary | ICD-10-CM | POA: Insufficient documentation

## 2022-04-15 DIAGNOSIS — F17210 Nicotine dependence, cigarettes, uncomplicated: Secondary | ICD-10-CM | POA: Diagnosis not present

## 2022-04-15 DIAGNOSIS — F1290 Cannabis use, unspecified, uncomplicated: Secondary | ICD-10-CM | POA: Insufficient documentation

## 2022-04-15 DIAGNOSIS — I351 Nonrheumatic aortic (valve) insufficiency: Secondary | ICD-10-CM | POA: Diagnosis not present

## 2022-04-15 DIAGNOSIS — R918 Other nonspecific abnormal finding of lung field: Secondary | ICD-10-CM | POA: Diagnosis present

## 2022-04-15 DIAGNOSIS — C3412 Malignant neoplasm of upper lobe, left bronchus or lung: Secondary | ICD-10-CM | POA: Insufficient documentation

## 2022-04-15 DIAGNOSIS — I10 Essential (primary) hypertension: Secondary | ICD-10-CM | POA: Diagnosis not present

## 2022-04-15 DIAGNOSIS — K219 Gastro-esophageal reflux disease without esophagitis: Secondary | ICD-10-CM | POA: Insufficient documentation

## 2022-04-15 DIAGNOSIS — F32A Depression, unspecified: Secondary | ICD-10-CM | POA: Insufficient documentation

## 2022-04-15 HISTORY — DX: Dyspnea, unspecified: R06.00

## 2022-04-15 HISTORY — DX: Unspecified osteoarthritis, unspecified site: M19.90

## 2022-04-15 HISTORY — DX: Essential (primary) hypertension: I10

## 2022-04-15 HISTORY — PX: BRONCHIAL BIOPSY: SHX5109

## 2022-04-15 HISTORY — PX: BRONCHIAL NEEDLE ASPIRATION BIOPSY: SHX5106

## 2022-04-15 HISTORY — DX: Malignant neoplasm of unspecified part of left bronchus or lung: C34.92

## 2022-04-15 LAB — COMPREHENSIVE METABOLIC PANEL
ALT: 28 U/L (ref 0–44)
AST: 48 U/L — ABNORMAL HIGH (ref 15–41)
Albumin: 3.7 g/dL (ref 3.5–5.0)
Alkaline Phosphatase: 64 U/L (ref 38–126)
Anion gap: 10 (ref 5–15)
BUN: 6 mg/dL (ref 6–20)
CO2: 25 mmol/L (ref 22–32)
Calcium: 9.4 mg/dL (ref 8.9–10.3)
Chloride: 105 mmol/L (ref 98–111)
Creatinine, Ser: 0.7 mg/dL (ref 0.44–1.00)
GFR, Estimated: >60 mL/min (ref >60)
Glucose, Bld: 107 mg/dL — ABNORMAL HIGH (ref 70–99)
Potassium: 3.6 mmol/L (ref 3.5–5.1)
Sodium: 140 mmol/L (ref 135–145)
Total Bilirubin: 0.7 mg/dL (ref 0.3–1.2)
Total Protein: 7.7 g/dL (ref 6.5–8.1)

## 2022-04-15 LAB — CBC
HCT: 40.8 % (ref 36.0–46.0)
Hemoglobin: 13.8 g/dL (ref 12.0–15.0)
MCH: 30.8 pg (ref 26.0–34.0)
MCHC: 33.8 g/dL (ref 30.0–36.0)
MCV: 91.1 fL (ref 80.0–100.0)
Platelets: 184 10*3/uL (ref 150–400)
RBC: 4.48 MIL/uL (ref 3.87–5.11)
RDW: 14.6 % (ref 11.5–15.5)
WBC: 4.7 10*3/uL (ref 4.0–10.5)
nRBC: 0 % (ref 0.0–0.2)

## 2022-04-15 LAB — SARS CORONAVIRUS 2 BY RT PCR: SARS Coronavirus 2 by RT PCR: NEGATIVE

## 2022-04-15 SURGERY — BRONCHOSCOPY, WITH BIOPSY USING ELECTROMAGNETIC NAVIGATION
Anesthesia: General | Laterality: Left

## 2022-04-15 MED ORDER — FENTANYL CITRATE (PF) 100 MCG/2ML IJ SOLN
INTRAMUSCULAR | Status: DC | PRN
  Administered 2022-04-15 (×2): 50 ug via INTRAVENOUS

## 2022-04-15 MED ORDER — METOPROLOL TARTRATE 5 MG/5ML IV SOLN
INTRAVENOUS | Status: DC | PRN
  Administered 2022-04-15 (×3): 1 mg via INTRAVENOUS

## 2022-04-15 MED ORDER — PROPOFOL 10 MG/ML IV BOLUS
INTRAVENOUS | Status: DC | PRN
  Administered 2022-04-15: 25 mg via INTRAVENOUS
  Administered 2022-04-15: 30 mg via INTRAVENOUS
  Administered 2022-04-15: 120 mg via INTRAVENOUS
  Administered 2022-04-15: 30 mg via INTRAVENOUS

## 2022-04-15 MED ORDER — OXYCODONE HCL 5 MG/5ML PO SOLN: 5.0000 mg | Freq: Once | ORAL | Status: DC | PRN

## 2022-04-15 MED ORDER — SUCCINYLCHOLINE CHLORIDE 200 MG/10ML IV SOSY
PREFILLED_SYRINGE | INTRAVENOUS | Status: DC | PRN
  Administered 2022-04-15: 100 mg via INTRAVENOUS

## 2022-04-15 MED ORDER — SUGAMMADEX SODIUM 200 MG/2ML IV SOLN
INTRAVENOUS | Status: DC | PRN
  Administered 2022-04-15 (×2): 100 mg via INTRAVENOUS

## 2022-04-15 MED ORDER — METOPROLOL TARTRATE 5 MG/5ML IV SOLN
INTRAVENOUS | Status: AC
  Filled 2022-04-15: qty 5

## 2022-04-15 MED ORDER — ESMOLOL HCL 100 MG/10ML IV SOLN
INTRAVENOUS | Status: DC | PRN
  Administered 2022-04-15: 20 mg via INTRAVENOUS
  Administered 2022-04-15: 30 mg via INTRAVENOUS
  Administered 2022-04-15: 20 mg via INTRAVENOUS

## 2022-04-15 MED ORDER — FENTANYL CITRATE (PF) 100 MCG/2ML IJ SOLN: 25.0000 ug | INTRAMUSCULAR | Status: DC | PRN

## 2022-04-15 MED ORDER — LIDOCAINE 2% (20 MG/ML) 5 ML SYRINGE
INTRAMUSCULAR | Status: DC | PRN
  Administered 2022-04-15: 60 mg via INTRAVENOUS

## 2022-04-15 MED ORDER — ONDANSETRON HCL 4 MG/2ML IJ SOLN
INTRAMUSCULAR | Status: DC | PRN
  Administered 2022-04-15: 4 mg via INTRAVENOUS

## 2022-04-15 MED ORDER — OXYCODONE HCL 5 MG PO TABS: 5.0000 mg | ORAL_TABLET | Freq: Once | ORAL | Status: DC | PRN

## 2022-04-15 MED ORDER — ROCURONIUM BROMIDE 10 MG/ML (PF) SYRINGE
PREFILLED_SYRINGE | INTRAVENOUS | Status: DC | PRN
  Administered 2022-04-15: 10 mg via INTRAVENOUS
  Administered 2022-04-15: 70 mg via INTRAVENOUS

## 2022-04-15 MED ORDER — LACTATED RINGERS IV SOLN: INTRAVENOUS | Status: DC

## 2022-04-15 MED ORDER — DEXAMETHASONE SODIUM PHOSPHATE 10 MG/ML IJ SOLN
INTRAMUSCULAR | Status: DC | PRN
  Administered 2022-04-15: 5 mg via INTRAVENOUS

## 2022-04-15 MED ORDER — CHLORHEXIDINE GLUCONATE 0.12 % MT SOLN
15.0000 mL | Freq: Once | OROMUCOSAL | Status: AC
  Administered 2022-04-15: 15 mL via OROMUCOSAL
  Filled 2022-04-15 (×2): qty 15

## 2022-04-15 MED ORDER — ONDANSETRON HCL 4 MG/2ML IJ SOLN: 4.0000 mg | Freq: Four times a day (QID) | INTRAMUSCULAR | Status: DC | PRN

## 2022-04-15 MED ORDER — PROPOFOL 500 MG/50ML IV EMUL
INTRAVENOUS | Status: DC | PRN
  Administered 2022-04-15: 150 ug/kg/min via INTRAVENOUS

## 2022-04-15 MED ORDER — MIDAZOLAM HCL 2 MG/2ML IJ SOLN
INTRAMUSCULAR | Status: DC | PRN
  Administered 2022-04-15: 2 mg via INTRAVENOUS

## 2022-04-15 SURGICAL SUPPLY — 2 items
fiducial LUL IMPLANT
fiducial lingula IMPLANT

## 2022-04-15 NOTE — Interval H&P Note (Signed)
History and Physical Interval Note:  04/15/2022 8:52 AM  Rachael Stone  has presented today for surgery, with the diagnosis of lung nodule.  The various methods of treatment have been discussed with the patient and family. After consideration of risks, benefits and other options for treatment, the patient has consented to  Procedure(s) with comments: ROBOTIC ASSISTED NAVIGATIONAL BRONCHOSCOPY (Left) - ION w/ CIOS as a surgical intervention.  The patient's history has been reviewed, patient examined, no change in status, stable for surgery.  I have reviewed the patient's chart and labs.  Questions were answered to the patient's satisfaction.     [place]

## 2022-04-15 NOTE — Anesthesia Procedure Notes (Signed)
Procedure Name: Intubation Date/Time: 04/15/2022 9:12 AM  Performed by: Janene Harvey, CRNAPre-anesthesia Checklist: Patient identified, Emergency Drugs available, Suction available and Patient being monitored Patient Re-evaluated:Patient Re-evaluated prior to induction Oxygen Delivery Method: Circle system utilized Preoxygenation: Pre-oxygenation with 100% oxygen Induction Type: IV induction Ventilation: Mask ventilation without difficulty Laryngoscope Size: Miller and 2 Grade View: Grade I Tube type: Oral Tube size: 8.5 mm Number of attempts: 1 Airway Equipment and Method: Stylet and Oral airway Placement Confirmation: ETT inserted through vocal cords under direct vision, positive ETCO2 and breath sounds checked- equal and bilateral Secured at: 22 cm Tube secured with: Tape Dental Injury: Teeth and Oropharynx as per pre-operative assessment

## 2022-04-15 NOTE — Discharge Instructions (Signed)
Flexible Bronchoscopy, Care After This sheet gives you information about how to care for yourself after your test. Your doctor may also give you more specific instructions. If you have problems or questions, contact your doctor. Follow these instructions at home: Eating and drinking Do not eat or drink anything (not even water) for 2 hours after your test, or until your numbing medicine (local anesthetic) wears off. When your numbness is gone and your cough and gag reflexes have come back, you may: Eat only soft foods. Slowly drink liquids. The day after the test, go back to your normal diet. Driving Do not drive for 24 hours if you were given a medicine to help you relax (sedative). Do not drive or use heavy machinery while taking prescription pain medicine. General instructions  Take over-the-counter and prescription medicines only as told by your doctor. Return to your normal activities as told. Ask what activities are safe for you. Do not use any products that have nicotine or tobacco in them. This includes cigarettes and e-cigarettes. If you need help quitting, ask your doctor. Keep all follow-up visits as told by your doctor. This is important. It is very important if you had a tissue sample (biopsy) taken. Get help right away if: You have shortness of breath that gets worse. You get light-headed. You feel like you are going to pass out (faint). You have chest pain. You cough up: More than a little blood. More blood than before. Summary Do not eat or drink anything (not even water) for 2 hours after your test, or until your numbing medicine wears off. Do not use cigarettes. Do not use e-cigarettes. Get help right away if you have chest pain.  This information is not intended to replace advice given to you by your health care provider. Make sure you discuss any questions you have with your health care provider. Document Released: 04/27/2009 Document Revised: 06/12/2017 Document  Reviewed: 07/18/2016 Elsevier Patient Education  2020 [employer].

## 2022-04-15 NOTE — Transfer of Care (Signed)
Immediate Anesthesia Transfer of Care Note  Patient: Rachael Stone  Procedure(s) Performed: ROBOTIC ASSISTED NAVIGATIONAL BRONCHOSCOPY (Left) BRONCHIAL NEEDLE ASPIRATION BIOPSIES BRONCHIAL BIOPSIES  Patient Location: PACU  Anesthesia Type:General  Level of Consciousness: drowsy and patient cooperative  Airway & Oxygen Therapy: Patient Spontanous Breathing and Patient connected to face mask oxygen  Post-op Assessment: Report given to RN and Post -op Vital signs reviewed and stable  Post vital signs: Reviewed and stable  Last Vitals:  Vitals Value Taken Time  BP    Temp    Pulse    Resp    SpO2      Last Pain:  Vitals:   04/15/22 0647  TempSrc:   PainSc: 0-No pain         Complications: No notable events documented.

## 2022-04-15 NOTE — Op Note (Signed)
Video Bronchoscopy with Robotic Assisted Bronchoscopic Navigation   Date of Operation: 04/15/2022   Pre-op Diagnosis: LUL and lingular pulmonary nodules  Post-op Diagnosis: LUL and lingular pulmonary nodules   Surgeon: [name], DO   Assistants: None   Anesthesia: General endotracheal anesthesia  Operation: Flexible video fiberoptic bronchoscopy with robotic assistance and biopsies.  Estimated Blood Loss: Minimal  Complications: None  Indications and History: Rachael Stone is a 56 y.o. female with history of LUL and lingular lung nodules. The risks, benefits, complications, treatment options and expected outcomes were discussed with the patient.  The possibilities of pneumothorax, pneumonia, reaction to medication, pulmonary aspiration, perforation of a viscus, bleeding, failure to diagnose a condition and creating a complication requiring transfusion or operation were discussed with the patient who freely signed the consent.    Description of Procedure: The patient was seen in the Preoperative Area, was examined and was deemed appropriate to proceed.  The patient was taken to [facility] endoscopy room 3, identified as [name] and the procedure verified as Flexible Video Fiberoptic Bronchoscopy.  A Time Out was held and the above information confirmed.   Prior to the date of the procedure a high-resolution CT scan of the chest was performed. Utilizing ION software program a virtual tracheobronchial tree was generated to allow the creation of distinct navigation pathways to the patient's parenchymal abnormalities. After being taken to the operating room general anesthesia was initiated and the patient  was orally intubated. The video fiberoptic bronchoscope was introduced via the endotracheal tube and a general inspection was performed which showed normal right and left lung anatomy, aspiration of the bilateral mainstems was completed to remove any remaining secretions. Robotic  catheter inserted into patient's endotracheal tube.   Target #1 Lingular nodule: The distinct navigation pathways prepared prior to this procedure were then utilized to navigate to patient's lesion identified on CT scan. The robotic catheter was secured into place and the vision probe was withdrawn.  Lesion location was approximated using fluoroscopy and three-dimensional cone beam CT imaging: Under fluoroscopic guidance transbronchial needle brushings, transbronchial needle biopsies, and transbronchial forceps biopsies were performed to be sent for cytology and pathology.  Following tissue sampling a single fiducial was placed within proximity of the lesion using the fiducial catheter wire and delivery kit under direct fluoroscopy.  Target #2 left upper lobe: The distinct navigation pathways prepared prior to this procedure were then utilized to navigate to patient's lesion identified on CT scan. The robotic catheter was secured into place and the vision probe was withdrawn.  Lesion location was approximated using fluoroscopy and three-dimensional cone beam CT. Under fluoroscopic guidance transbronchial needle brushings, transbronchial needle biopsies, and transbronchial forceps biopsies were performed to be sent for cytology and pathology.  Following tissue sampling a single fiducial was placed within proximity of the lesion using fiducial catheter wire and delivery kit under direct fluoroscopy  At the end of the procedure a general airway inspection was performed and there was no evidence of active bleeding. The bronchoscope was removed.  The patient tolerated the procedure well. There was no significant blood loss and there were no obvious complications. A post-procedural chest x-ray is pending.  Samples Target #1: 1. Transbronchial Wang needle biopsies from lingula 2. Transbronchial forceps biopsies from lingula  Samples Target #2: 1. Transbronchial Wang needle biopsies from LUL 2.  Transbronchial forceps biopsies from LUL  Plans:  The patient will be discharged from the PACU to home when recovered from anesthesia and  after chest x-ray is reviewed. We will review the cytology, pathology results with the patient when they become available. Outpatient followup will be with Bradley L Icard, DO.   Bradley L Icard, DO [facility] 04/15/2022 10:47 AM     

## 2022-04-16 NOTE — Anesthesia Postprocedure Evaluation (Signed)
Anesthesia Post Note  Patient: Rachael Stone  Procedure(s) Performed: ROBOTIC ASSISTED NAVIGATIONAL BRONCHOSCOPY (Left) BRONCHIAL NEEDLE ASPIRATION BIOPSIES BRONCHIAL BIOPSIES     Patient location during evaluation: PACU Anesthesia Type: General Level of consciousness: awake and alert Pain management: pain level controlled Vital Signs Assessment: post-procedure vital signs reviewed and stable Respiratory status: spontaneous breathing, nonlabored ventilation, respiratory function stable and patient connected to nasal cannula oxygen Cardiovascular status: blood pressure returned to baseline and stable Postop Assessment: no apparent nausea or vomiting Anesthetic complications: no   No notable events documented.  Last Vitals:  Vitals:   04/15/22 1100 04/15/22 1115  BP: (!) 145/81 (!) 150/74  Pulse: 90 84  Resp: 20 12  Temp:  36.7 C  SpO2: 93% 96%    Last Pain:  Vitals:   04/15/22 1115  TempSrc:   PainSc: 0-No pain                 [name]

## 2022-04-17 ENCOUNTER — Telehealth: Payer: Self-pay | Admitting: Pulmonary Disease

## 2022-04-17 DIAGNOSIS — C3492 Malignant neoplasm of unspecified part of left bronchus or lung: Secondary | ICD-10-CM

## 2022-04-17 LAB — CYTOLOGY - NON PAP

## 2022-04-17 NOTE — Telephone Encounter (Signed)
PCCM:  Called spoke with the patient regarding her pathology results.  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  PFTs have been ordered.  Patient will need to have PFTs completed prior to office visit with cardiothoracic surgery.  Ambulatory referral placed to cardiothoracic surgery.  [name], DO [facility] 04/17/2022 3:05 PM

## 2022-04-17 NOTE — Telephone Encounter (Signed)
Our first opening in the office is 11/7.  I have left a vm for RT to call pt to schedule at the hospital.  I have also sent a message to June Leap to see if possible to work pt in any sooner in the office.

## 2022-04-17 NOTE — Progress Notes (Signed)
Tay,   Orders have been placed.  Please ensure PFTs are complete prior to office appointment with cardiothoracic surgery.  Okay to have PFTs completed at the hospital if necessary.See please separate phone note.  I have already called and spoke with the patient.  Thanks,  BLI  [name], DO [place] [facility] 04/17/2022 3:05 PM

## 2022-04-17 NOTE — Telephone Encounter (Signed)
Pt has been scheduled for pft in our office on 10/10 at 2:00

## 2022-04-20 ENCOUNTER — Encounter (HOSPITAL_COMMUNITY): Payer: Self-pay | Admitting: Pulmonary Disease

## 2022-04-22 ENCOUNTER — Ambulatory Visit (INDEPENDENT_AMBULATORY_CARE_PROVIDER_SITE_OTHER): Payer: Commercial Managed Care - HMO | Admitting: Pulmonary Disease

## 2022-04-22 DIAGNOSIS — C3492 Malignant neoplasm of unspecified part of left bronchus or lung: Secondary | ICD-10-CM

## 2022-04-22 LAB — PULMONARY FUNCTION TEST
DL/VA % pred: 92 %
DL/VA: 3.89 ml/min/mmHg/L
DLCO cor % pred: 77 %
DLCO cor: 16.8 ml/min/mmHg
DLCO unc % pred: 78 %
DLCO unc: 17 ml/min/mmHg
FEF 25-75 Post: 3.74 L/sec
FEF 25-75 Pre: 3.41 L/sec
FEF2575-%Change-Post: 9 %
FEF2575-%Pred-Post: 139 %
FEF2575-%Pred-Pre: 126 %
FEV1-%Change-Post: 0 %
FEV1-%Pred-Post: 89 %
FEV1-%Pred-Pre: 89 %
FEV1-Post: 2.56 L
FEV1-Pre: 2.54 L
FEV1FVC-%Change-Post: -1 %
FEV1FVC-%Pred-Pre: 112 %
FEV6-%Change-Post: 2 %
FEV6-%Pred-Post: 82 %
FEV6-%Pred-Pre: 80 %
FEV6-Post: 2.92 L
FEV6-Pre: 2.86 L
FEV6FVC-%Pred-Post: 103 %
FEV6FVC-%Pred-Pre: 103 %
FVC-%Change-Post: 2 %
FVC-%Pred-Post: 80 %
FVC-%Pred-Pre: 78 %
FVC-Post: 2.92 L
FVC-Pre: 2.86 L
Post FEV1/FVC ratio: 88 %
Post FEV6/FVC ratio: 100 %
Pre FEV1/FVC ratio: 89 %
Pre FEV6/FVC Ratio: 100 %
RV % pred: 88 %
RV: 1.71 L
TLC % pred: 91 %
TLC: 4.86 L

## 2022-04-22 NOTE — Patient Instructions (Signed)
Full PFT Performed Today  

## 2022-04-22 NOTE — Progress Notes (Signed)
Full PFT Performed Today  

## 2022-04-24 ENCOUNTER — Other Ambulatory Visit: Payer: Self-pay | Admitting: *Deleted

## 2022-04-24 NOTE — Progress Notes (Signed)
The proposed treatment discussed in conference is for discussion purpose only and is not a binding recommendation.  The patients have not been physically examined, or presented with their treatment options.  Therefore, final treatment plans cannot be decided.  

## 2022-04-28 ENCOUNTER — Encounter: Payer: Self-pay | Admitting: Acute Care

## 2022-04-28 NOTE — Progress Notes (Unsigned)
History of Present Illness Rachael Stone is a 56 y.o. female current every day smoker with  past medical history of cardiomyopathy, alcohol use, hypertension, aortic insufficiency, moderate to severe mitral regurg and  history of tobacco use.Patient was referred to see Dr. Valeta Harms after having CT scan of the chest  02/27/2022 which  revealed a new lobulated lingular nodule measuring 1.5 x 1.1 cm suspicious for lung cancer.  She underwent Flexible video fiberoptic bronchoscopy with robotic assistance and biopsies on 04/15/2022.   04/29/2022 Pt. Presents for follow up after Flexible video fiberoptic bronchoscopy with robotic assistance and biopsies on 04/15/2022. 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 Plan is for PFT's and referral to Thoracic Surgery. Referrals have already been placed and patient has an appointment 10/19 with Dr. [name].   She is very tearful today in the office . She states she has not been taking any of her medications since before her bronch on 04/15/2022. She estimates about 1 month. Her medications include Norvasc, Coreg, Lasix, Cozaar and potassium. She last saw Dr. Manley Mason 11/08/2021. BP in the office today 142/64, and her HR was 122. She does have medication to take. I did confirm this at the time of our visit.I have messaged Dr. Manley Mason, and he will have his nurse reach out to her today to get her seen, hopefully prior to any surgical procedures.   She states she has not had any issues with bleeding or pain from her procedure. She had scan bleeding for a few days, but  that has cleared.  She states she has  worsening shortness of breath . This may be because she has not been taking her lasix for about a month. I have asked her to resume her lasix, and I will see her in a month to ensure her shortness of breath is better. If it is not , we can work it up.   She is continuing to smoke. We had a long conversation about why she needs to quit . She understands  why she needs to quit. I have given her the Quit Now number for free nicotine replacement therapy.  PFT's have been completed.             Test Results:  CYTOLOGY - NON PAP CASE: [id number] PATIENT: Rachael Stone Non-Gynecological Cytology Report  Clinical History: Lung nodule  FINAL MICROSCOPIC DIAGNOSIS: A. LUNG, TARGET 1 LINGULA, FINE NEEDLE ASPIRATION: - Malignant cells consistent with adenocarcinoma - See comment  B. LUNG, TARGET 2 LUL, FINE NEEDLE ASPIRATION: - No malignant cells identified  COMMENT: A. Dr. Spero Curb agrees.  There is sufficient cellularity in the cellblock for part A for molecular testing.    PET scan 9/282023 The lingular nodule is not significantly hypermetabolic. Given interval enlargement over prior CTs, indolent primary bronchogenic carcinoma remains possible. Potential clinical strategies include tissue sampling or chest CT follow-up at 3 months. Age advanced coronary artery atherosclerosis. Recommend assessment of coronary risk factors. The right-side of the colon appears thick walled but is underdistended. Correlate with any symptoms to suggest colitis. Incidental findings, including: Aortic atherosclerosis (ICD10-I70.0) and emphysema (ICD10-J43.9). Hepatic steatosis. Not mentioned above is a subcutaneous nodule superficial to the left gluteal which measures less than a cm and a S.U.V. max of 2.7. Most likely an incidental infectious/inflammatory lesion. This could be correlated with physical exam.       CT Chest 02/2022 Lobulated lingular nodule measuring 1.5 x 1.1 cm, new from 2021 exam. This nodule abuts the  left heart border and is suspicious for primary bronchogenic malignancy. Recommend PET-CT characterization. Bilobed nodule versus 2 adjacent nodules in the left upper lobe posterior apex measuring up to 13 x 6 mm, unchanged in size from prior exam, and only minimally increased in size from 2017 exam. Favor benign  process. Sub solid 3 mm right upper lobe nodule, nonspecific. Moderate emphysema. Coronary artery calcifications. Hepatic steatosis.     Latest Ref Rng & Units 04/15/2022    7:00 AM 12/05/2020    6:55 AM 03/17/2020    6:25 PM  CBC  WBC 4.0 - 10.5 K/uL 4.7  5.3  7.7   Hemoglobin 12.0 - 15.0 g/dL 13.8  14.3  15.1   Hematocrit 36.0 - 46.0 % 40.8  43.5  46.2   Platelets 150 - 400 K/uL 184  162  213        Latest Ref Rng & Units 04/15/2022    7:00 AM 02/26/2021    1:20 PM 12/05/2020    6:55 AM  BMP  Glucose 70 - 99 mg/dL 107  93  117   BUN 6 - 20 mg/dL 6  6  <5   Creatinine 0.44 - 1.00 mg/dL 0.70  0.70  0.66   BUN/Creat Ratio 9 - 23  9    Sodium 135 - 145 mmol/L 140  143  137   Potassium 3.5 - 5.1 mmol/L 3.6  3.4  3.8   Chloride 98 - 111 mmol/L 105  104  99   CO2 22 - 32 mmol/L 25  20  21    Calcium 8.9 - 10.3 mg/dL 9.4  9.3  10.0     BNP    Component Value Date/Time   BNP 223.5 (H) 12/05/2020 0848    ProBNP No results found for: "PROBNP"  PFT    Component Value Date/Time   FEV1PRE 2.54 04/22/2022 1302   FEV1POST 2.56 04/22/2022 1302   FVCPRE 2.86 04/22/2022 1302   FVCPOST 2.92 04/22/2022 1302   TLC 4.86 04/22/2022 1302   DLCOUNC 17.00 04/22/2022 1302   PREFEV1FVCRT 89 04/22/2022 1302   PSTFEV1FVCRT 88 04/22/2022 1302    DG Chest Port 1 View  Result Date: 04/15/2022 CLINICAL DATA:  [id number] S/P bronchoscopy with biopsy [id number] EXAM: PORTABLE CHEST 1 VIEW COMPARISON:  Dec 05, 2020, April 02, 2022 FINDINGS: The cardiomediastinal silhouette is unchanged in contour. No pleural effusion. No pneumothorax. Bibasilar reticular nodularity. Two clips project over the LEFT lung. Visualized abdomen is unremarkable. IMPRESSION: No significant pneumothorax status post bronchoscopy. Bibasilar reticulonodularity is nonspecific and could reflect aspiration, atelectasis or post biopsy change. Electronically Signed   By: Valentino Saxon M.D.   On: 04/15/2022 11:48   DG C-ARM  BRONCHOSCOPY  Result Date: 04/15/2022 C-ARM BRONCHOSCOPY: Fluoroscopy was utilized by the requesting physician.  No radiographic interpretation.   DG C-Arm 1-60 Min-No Report  Result Date: 04/15/2022 Fluoroscopy was utilized by the requesting physician.  No radiographic interpretation.   NM PET Image Initial (PI) Skull Base To Thigh (F-18 FDG)  Addendum Date: 04/11/2022   ADDENDUM REPORT: 04/11/2022 09:16 ADDENDUM: Not mentioned above is a subcutaneous nodule superficial to the left gluteal which measures less than a cm and a S.U.V. max of 2.7. Most likely an incidental infectious/inflammatory lesion. This could be correlated with physical exam. Electronically Signed   By: Abigail Miyamoto M.D.   On: 04/11/2022 09:16   Result Date: 04/11/2022 CLINICAL DATA:  Initial treatment strategy for 1.5 cm lingular nodule on chest CT. EXAM: NUCLEAR  MEDICINE PET SKULL BASE TO THIGH TECHNIQUE: 6.3 mCi F-18 FDG was injected intravenously. Full-ring PET imaging was performed from the skull base to thigh after the radiotracer. CT data was obtained and used for attenuation correction and anatomic localization. Fasting blood glucose: 79 mg/dl COMPARISON:  Chest CTs, most recent 02/27/2022. Abdominopelvic CT 03/18/2020. FINDINGS: Mediastinal blood pool activity: SUV max 2.6 Liver activity: SUV max NA NECK: No areas of abnormal hypermetabolism. Incidental CT findings: No cervical adenopathy. CHEST: No thoracic nodal hypermetabolism. The lingular nodule measures 1.6 cm and a S.U.V. max of 1.6 on 82/4. Incidental CT findings: Non FDG avid bilobed left apical nodule versus 2 adjacent nodules, as on CT. Example 1.2 cm on 54/4. Centrilobular and paraseptal emphysema. Mild cardiomegaly. Three vessel coronary artery calcification. Aortic atherosclerosis. ABDOMEN/PELVIS: No abdominopelvic parenchymal or nodal hypermetabolism. Incidental CT findings: Normal adrenal glands. Mild hepatic steatosis. Interpolar left renal 1.0 cm fluid  density lesion is likely a cyst. Abdominal aortic atherosclerosis. Normal noncontrast appearance of the gallbladder, pancreas, spleen. The right-sided colon appears thick walled, possibly due to underdistention. Pelvic floor laxity. Grossly normal uterus and adnexa. SKELETON: No abnormal marrow activity. Incidental CT findings: None. IMPRESSION: 1. The lingular nodule is not significantly hypermetabolic. Given interval enlargement over prior CTs, indolent primary bronchogenic carcinoma remains possible. Potential clinical strategies include tissue sampling or chest CT follow-up at 3 months. 2. Age advanced coronary artery atherosclerosis. Recommend assessment of coronary risk factors. 3. The right-side of the colon appears thick walled but is underdistended. Correlate with any symptoms to suggest colitis. 4. Incidental findings, including: Aortic atherosclerosis (ICD10-I70.0) and emphysema (ICD10-J43.9). Hepatic steatosis. Electronically Signed: By: Abigail Miyamoto M.D. On: 04/11/2022 09:14     Past medical hx Past Medical History:  Diagnosis Date   Arthritis    Back pain    Cardiomyopathy ([place])    a. 04/08/2016 Echo: EF 35%, diff HK, mild LVH, Gr2 DD, mod AI, mod to sev MR, mildly dil LA.   DEPRESSIVE DISORDER NOT ELSEWHERE CLASSIFIED 12/14/2008   no current problem per patient on 04/14/22.  Qualifier: Diagnosis of  By: [name], [name]   DYSFUNCTIONAL UTERINE BLEEDING 11/12/2007   Qualifier: Diagnosis of  By: [name], [name]     Dyspnea    ETOH abuse    Hypertension    Hypertensive heart disease with heart failure ([place])    Marijuana abuse    Last use 04/14/22   MENORRHAGIA 12/14/2008   Qualifier: Diagnosis of  By: [name], [name]     Moderate aortic insufficiency    a. 04/08/2016 Echo: mod AI.   Moderate to Severe Mitral Regurgitation    a. 04/08/2016 Echo: mod-sev MR directed centrally.   Tobacco abuse      Social History   Tobacco Use   Smoking status: Every Day    Packs/day: 1.00    Years:  34.00    Total pack years: 34.00    Types: Cigarettes   Smokeless tobacco: Never   Tobacco comments:    4 packs a cigarettes a week. 03/31/2022 [place].  Vaping Use   Vaping Use: Never used  Substance Use Topics   Alcohol use: Yes    Alcohol/week: 4.0 standard drinks of alcohol    Types: 4 Standard drinks or equivalent per week    Comment: At least 12 ounces of homemade moon shine every other day.   Drug use: Yes    Types: Marijuana    Comment: Last use was on 04/14/22.  Smokes marijuana daily.  Used to smoke cocaine laced  marijuana cigarettes for 15-20 yrs but quit cocaine 6 yrs ago.    Ms.Flanigan reports that she has been smoking cigarettes. She has a 34.00 pack-year smoking history. She has never used smokeless tobacco. She reports current alcohol use of about 4.0 standard drinks of alcohol per week. She reports current drug use. Drug: Marijuana.  Tobacco Cessation: Smokes 4 packs of cigarettes per week.  Counseled on smoking cessation   Past surgical hx, Family hx, Social hx all reviewed.  Current Outpatient Medications on File Prior to Visit  Medication Sig   amLODipine (NORVASC) 5 MG tablet Take 1 tablet (5 mg total) by mouth daily. (Patient not taking: Reported on 04/29/2022)   carvedilol (COREG) 3.125 MG tablet Take 1 tablet (3.125 mg total) by mouth 2 (two) times daily. (Patient not taking: Reported on 04/29/2022)   furosemide (LASIX) 20 MG tablet Take 1 tablet (20 mg total) by mouth daily. (Patient not taking: Reported on 04/29/2022)   losartan (COZAAR) 25 MG tablet Take 1 tablet by mouth once daily (Patient not taking: Reported on 04/29/2022)   potassium chloride (KLOR-CON) 10 MEQ tablet Take 1 tablet (10 mEq total) by mouth daily. (Patient not taking: Reported on 04/29/2022)   No current facility-administered medications on file prior to visit.     Allergies  Allergen Reactions   Ibuprofen Other (See Comments)    Irritates stomach    Review Of  Systems:  Constitutional:   No  weight loss, night sweats,  Fevers, chills, fatigue, or  lassitude.  HEENT:   No headaches,  Difficulty swallowing,  Tooth/dental problems, or  Sore throat,                No sneezing, itching, ear ache, nasal congestion, post nasal drip,   CV:  No chest pain,  Orthopnea, PND, swelling in lower extremities, anasarca, dizziness, palpitations, syncope.   GI  No heartburn, indigestion, abdominal pain, nausea, vomiting, diarrhea, change in bowel habits, loss of appetite, bloody stools.   Resp: + shortness of breath with exertion less at rest.  No excess mucus, no productive cough,  No non-productive cough,  No coughing up of blood.  No change in color of mucus.  No wheezing.  No chest wall deformity  Skin: no rash or lesions.  GU: no dysuria, change in color of urine, no urgency or frequency.  No flank pain, no hematuria   MS:  No joint pain or swelling.  No decreased range of motion.  No back pain.  Psych:  No change in mood or affect. No depression or anxiety.  No memory loss.   Vital Signs BP (!) 142/64 (BP Location: Left Arm, Cuff Size: Normal)   Pulse (!) 122   Temp 98.4 F (36.9 C) (Temporal)   Ht 5' 5.5" (1.664 m)   Wt 128 lb 6.4 oz (58.2 kg)   LMP  (LMP Unknown)   SpO2 99%   BMI 21.04 kg/m    Physical Exam:  General- No distress,  A&Ox3, tearful ENT: No sinus tenderness, TM clear, pale nasal mucosa, no oral exudate,no post nasal drip, no LAN Cardiac: S1, S2, regular rate and rhythm, no murmur Chest: No wheeze/ rales/ dullness; no accessory muscle use, no nasal flaring, no sternal retractions, coarse breath sounds Abd.: Soft Non-tender, NDF, BS +, Body mass index is 21.04 kg/m.  Ext: No clubbing cyanosis, edema, no obvious deformities Neuro:  normal strength, MAE x 4, A&O x 3 Skin: No rashes, warm  and dry, no lesions  Psych: normal mood and behavior   Assessment/Plan Lobulated lingular lung nodule measuring 1.5 x 1.1 cm, new from  2021 exam. This nodule abuts the left heart border and is suspicious for primary bronchogenic malignancy. Plan PFT's Appointment with thoracic surgery scheduled for 05/01/2022 Molecular testing pending No issues post bronchoscopy/ biopsies  Current every day smoker Plan Counseled on smoking cessation  Call 1-800 [place] for free nicotine replacement therapy  Heart Failure Has not been taking medications x 1 month Plan Please call Dr.Nahsar's office to be seen before any surgery if possible.  Restart your daily medications , your Lasix included.   Dyspnea May be 2/2 not taking her medications  Plan  Restart daily medications Follow up in 1 month, to ensure shortness of breath is better.   Tobacco Abuse Plan Work on quitting smoking.  Smoking cessation counseling given.  Call 1-800- QUIT NOW for free nicotine gum or mints.  Medication non-compliance BP elevated in office today Plan Re-start your medications  Follow up with Dr. Manley Mason prior to surgery>> I have messaged his office and they know to reach out to her.  She is also going to call his office today.   I spent 40 minutes dedicated to the care of this patient on the date of this encounter to include pre-visit review of records, face-to-face time with the patient discussing conditions above, post visit ordering of testing, clinical documentation with the electronic health record, making appropriate referrals as documented, and communicating necessary information to the patient's healthcare team.   Magdalen Spatz, NP 04/29/2022  9:44 AM

## 2022-04-28 NOTE — Progress Notes (Unsigned)
[address]       [place] [address]             [phone]                    Rachael Stone [facility] Medical Record #[id number] Date of Birth: [birth year]  Referring: Kerin Perna, NP Primary Care: Kerin Perna, NP Primary Cardiologist: Mertie Moores, MD  Chief Complaint:   No chief complaint on file.   History of Present Illness:    Rachael Stone 56 y.o. female ***   This is a 56 year old female, past medical history of cardiomyopathy, alcohol use, hypertension, aortic insufficiency, moderate to severe mitral regurg history of tobacco use.Patient was referred after having CT scan of the chest completed on 02/27/2022.  This revealed a new lobulated lingular nodule measuring 1.5 x 1.1 cm suspicious for lung cancer.  Additionally there was a bilobed nodule versus 2 adjacent nodules in the left upper lobe posterior apex measuring 13 x 6 relatively unchanged from a 2017 exam.  Patient was referred here today to discuss abnormal CT imaging and next best steps. Reviewed the cts from 2022 the lung nodule was present on CT completed in the ED.   Smoking Hx: ***   Zubrod Score: At the time of surgery this patient's most appropriate activity status/level should be described as: []     0    Normal activity, no symptoms []     1    Restricted in physical strenuous activity but ambulatory, able to do out light work []     2    Ambulatory and capable of self care, unable to do work activities, up and about               >50 % of waking hours                              []     3    Only limited self care, in bed greater than 50% of waking hours []     4    Completely disabled, no self care, confined to bed or chair []     5    Moribund   Past Medical History:  Diagnosis Date   Arthritis    Back pain    Cardiomyopathy ([place])    a. 04/08/2016 Echo: EF 35%, diff HK, mild LVH, Gr2 DD, mod AI, mod to sev MR, mildly dil LA.   DEPRESSIVE DISORDER NOT  ELSEWHERE CLASSIFIED 12/14/2008   no current problem per patient on 04/14/22.  Qualifier: Diagnosis of  By: [name], [name]   DYSFUNCTIONAL UTERINE BLEEDING 11/12/2007   Qualifier: Diagnosis of  By: [name], [name]     Dyspnea    ETOH abuse    Hypertension    Hypertensive heart disease with heart failure ([place])    Marijuana abuse    Last use 04/14/22   MENORRHAGIA 12/14/2008   Qualifier: Diagnosis of  By: [name], [name]     Moderate aortic insufficiency    a. 04/08/2016 Echo: mod AI.   Moderate to Severe Mitral Regurgitation    a. 04/08/2016 Echo: mod-sev MR directed centrally.   Tobacco abuse     Past Surgical History:  Procedure Laterality Date   BRONCHIAL BIOPSY  04/15/2022   Procedure: BRONCHIAL BIOPSIES;  Surgeon: [name], DO;  Location: [facility];  Service: Pulmonary;;  BRONCHIAL NEEDLE ASPIRATION BIOPSY  04/15/2022   Procedure: BRONCHIAL NEEDLE ASPIRATION BIOPSIES;  Surgeon: [name], DO;  Location: [facility];  Service: Pulmonary;;   CARDIAC CATHETERIZATION N/A 04/10/2016   Procedure: Right/Left Heart Cath and Coronary Angiography;  Surgeon: [name], MD;  Location: [facility];  Service: Cardiovascular;  Laterality: N/A;   EXPLORATORY LAPAROTOMY  2003   Ectopic Pregnancy - unsure which side or if tube was removed   TEE WITHOUT CARDIOVERSION N/A 06/26/2017   Procedure: TRANSESOPHAGEAL ECHOCARDIOGRAM (TEE);  Surgeon: [name], MD;  Location: [facility];  Service: Cardiovascular;  Laterality: N/A;   TUBAL LIGATION      Family History  Problem Relation Age of Onset   CAD Mother        First MI @ 10 - 42 total.   Cirrhosis Father        alcoholic - died in his 35'K.   Lupus Sister    Colon cancer Neg Hx    Colon polyps Neg Hx      Social History   Tobacco Use  Smoking Status Every Day   Packs/day: 1.00   Years: 34.00   Total pack years: 34.00   Types: Cigarettes  Smokeless Tobacco Never  Tobacco Comments   4 packs a  cigarettes a week. 03/31/2022 [place].    Social History   Substance and Sexual Activity  Alcohol Use Yes   Alcohol/week: 4.0 standard drinks of alcohol   Types: 4 Standard drinks or equivalent per week   Comment: At least 12 ounces of homemade moon shine every other day.     Allergies  Allergen Reactions   Ibuprofen Other (See Comments)    Irritates stomach    Current Outpatient Medications  Medication Sig Dispense Refill   amLODipine (NORVASC) 5 MG tablet Take 1 tablet (5 mg total) by mouth daily. 90 tablet 3   carvedilol (COREG) 3.125 MG tablet Take 1 tablet (3.125 mg total) by mouth 2 (two) times daily. 180 tablet 3   furosemide (LASIX) 20 MG tablet Take 1 tablet (20 mg total) by mouth daily. 90 tablet 3   losartan (COZAAR) 25 MG tablet Take 1 tablet by mouth once daily 30 tablet 7   potassium chloride (KLOR-CON) 10 MEQ tablet Take 1 tablet (10 mEq total) by mouth daily. 90 tablet 3   No current facility-administered medications for this visit.    ROS   PHYSICAL EXAMINATION: LMP  (LMP Unknown)  Physical Exam  Diagnostic Studies & Laboratory data:     Recent Radiology Findings:   DG Chest Port 1 View  Result Date: 04/15/2022 CLINICAL DATA:  [id number] S/P bronchoscopy with biopsy [id number] EXAM: PORTABLE CHEST 1 VIEW COMPARISON:  Dec 05, 2020, April 02, 2022 FINDINGS: The cardiomediastinal silhouette is unchanged in contour. No pleural effusion. No pneumothorax. Bibasilar reticular nodularity. Two clips project over the LEFT lung. Visualized abdomen is unremarkable. IMPRESSION: No significant pneumothorax status post bronchoscopy. Bibasilar reticulonodularity is nonspecific and could reflect aspiration, atelectasis or post biopsy change. Electronically Signed   By: Valentino Saxon M.D.   On: 04/15/2022 11:48   DG C-ARM BRONCHOSCOPY  Result Date: 04/15/2022 C-ARM BRONCHOSCOPY: Fluoroscopy was utilized by the requesting physician.  No radiographic interpretation.   DG  C-Arm 1-60 Min-No Report  Result Date: 04/15/2022 Fluoroscopy was utilized by the requesting physician.  No radiographic interpretation.   NM PET Image Initial (PI) Skull Base To Thigh (F-18 FDG)  Addendum Date: 04/11/2022   ADDENDUM REPORT:  04/11/2022 09:16 ADDENDUM: Not mentioned above is a subcutaneous nodule superficial to the left gluteal which measures less than a cm and a S.U.V. max of 2.7. Most likely an incidental infectious/inflammatory lesion. This could be correlated with physical exam. Electronically Signed   By: Abigail Miyamoto M.D.   On: 04/11/2022 09:16   Result Date: 04/11/2022 CLINICAL DATA:  Initial treatment strategy for 1.5 cm lingular nodule on chest CT. EXAM: NUCLEAR MEDICINE PET SKULL BASE TO THIGH TECHNIQUE: 6.3 mCi F-18 FDG was injected intravenously. Full-ring PET imaging was performed from the skull base to thigh after the radiotracer. CT data was obtained and used for attenuation correction and anatomic localization. Fasting blood glucose: 79 mg/dl COMPARISON:  Chest CTs, most recent 02/27/2022. Abdominopelvic CT 03/18/2020. FINDINGS: Mediastinal blood pool activity: SUV max 2.6 Liver activity: SUV max NA NECK: No areas of abnormal hypermetabolism. Incidental CT findings: No cervical adenopathy. CHEST: No thoracic nodal hypermetabolism. The lingular nodule measures 1.6 cm and a S.U.V. max of 1.6 on 82/4. Incidental CT findings: Non FDG avid bilobed left apical nodule versus 2 adjacent nodules, as on CT. Example 1.2 cm on 54/4. Centrilobular and paraseptal emphysema. Mild cardiomegaly. Three vessel coronary artery calcification. Aortic atherosclerosis. ABDOMEN/PELVIS: No abdominopelvic parenchymal or nodal hypermetabolism. Incidental CT findings: Normal adrenal glands. Mild hepatic steatosis. Interpolar left renal 1.0 cm fluid density lesion is likely a cyst. Abdominal aortic atherosclerosis. Normal noncontrast appearance of the gallbladder, pancreas, spleen. The right-sided colon  appears thick walled, possibly due to underdistention. Pelvic floor laxity. Grossly normal uterus and adnexa. SKELETON: No abnormal marrow activity. Incidental CT findings: None. IMPRESSION: 1. The lingular nodule is not significantly hypermetabolic. Given interval enlargement over prior CTs, indolent primary bronchogenic carcinoma remains possible. Potential clinical strategies include tissue sampling or chest CT follow-up at 3 months. 2. Age advanced coronary artery atherosclerosis. Recommend assessment of coronary risk factors. 3. The right-side of the colon appears thick walled but is underdistended. Correlate with any symptoms to suggest colitis. 4. Incidental findings, including: Aortic atherosclerosis (ICD10-I70.0) and emphysema (ICD10-J43.9). Hepatic steatosis. Electronically Signed: By: Abigail Miyamoto M.D. On: 04/11/2022 09:14       I have independently reviewed the above radiology studies  and reviewed the findings with the patient.   Recent Lab Findings: Lab Results  Component Value Date   WBC 4.7 04/15/2022   HGB 13.8 04/15/2022   HCT 40.8 04/15/2022   PLT 184 04/15/2022   GLUCOSE 107 (H) 04/15/2022   CHOL 162 04/25/2019   TRIG 133 04/25/2019   HDL 52 04/25/2019   LDLCALC 87 04/25/2019   ALT 28 04/15/2022   AST 48 (H) 04/15/2022   NA 140 04/15/2022   K 3.6 04/15/2022   CL 105 04/15/2022   CREATININE 0.70 04/15/2022   BUN 6 04/15/2022   CO2 25 04/15/2022   TSH 0.211 (L) 05/13/2021   INR 0.9 12/24/2018   HGBA1C 5.3 01/10/2019   Echo: 11/2021 IMPRESSIONS     1. Left ventricular ejection fraction, by estimation, is 55%. The left  ventricle has normal function. The left ventricle has no regional wall  motion abnormalities. The left ventricular internal cavity size was  moderately dilated. Left ventricular  diastolic parameters were normal.   2. Right ventricular systolic function is normal. The right ventricular  size is normal. There is moderately elevated pulmonary  artery systolic  pressure.   3. The mitral valve is abnormal. Trivial mitral valve regurgitation. No  evidence of mitral stenosis.   4. Severe appearing  AR by color flow with LVE PT1/2 367 msec and  holodiastolic reversals in descending thoracic aorta. . The aortic valve  is tricuspid. There is mild calcification of the aortic valve. There is  mild thickening of the aortic valve. Aortic   valve regurgitation is severe. Aortic valve sclerosis is present, with no  evidence of aortic valve stenosis.   5. The inferior vena cava is dilated in size with >50% respiratory  variability, suggesting right atrial pressure of 8 mmHg.  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 LUNG, TARGET 1 LINGULA, FINE NEEDLE ASPIRATION:  - Malignant cells consistent with adenocarcinoma  - See comment   B. LUNG, TARGET 2 LUL, FINE NEEDLE ASPIRATION:  - No malignant cells identified   Assessment / Plan:   56 yo female with biopsy proven LUL adenocarcinoma.  She also has evidence of severe AI on echo from this summer.       I  spent {CHL ONC TIME VISIT - HSJWT:[id number]} with  the patient face to face in counseling and coordination of care.    [name] 04/28/2022 11:18 AM

## 2022-04-29 ENCOUNTER — Encounter: Payer: Self-pay | Admitting: Acute Care

## 2022-04-29 ENCOUNTER — Telehealth: Payer: Self-pay

## 2022-04-29 ENCOUNTER — Ambulatory Visit (INDEPENDENT_AMBULATORY_CARE_PROVIDER_SITE_OTHER): Payer: Commercial Managed Care - HMO | Admitting: Acute Care

## 2022-04-29 VITALS — BP 142/64 | HR 122 | Temp 98.4°F | Ht 65.5 in | Wt 128.4 lb

## 2022-04-29 DIAGNOSIS — F17210 Nicotine dependence, cigarettes, uncomplicated: Secondary | ICD-10-CM

## 2022-04-29 DIAGNOSIS — Z91148 Patient's other noncompliance with medication regimen for other reason: Secondary | ICD-10-CM | POA: Diagnosis not present

## 2022-04-29 DIAGNOSIS — I351 Nonrheumatic aortic (valve) insufficiency: Secondary | ICD-10-CM

## 2022-04-29 DIAGNOSIS — Z720 Tobacco use: Secondary | ICD-10-CM

## 2022-04-29 DIAGNOSIS — C3490 Malignant neoplasm of unspecified part of unspecified bronchus or lung: Secondary | ICD-10-CM | POA: Diagnosis not present

## 2022-04-29 DIAGNOSIS — I509 Heart failure, unspecified: Secondary | ICD-10-CM

## 2022-04-29 DIAGNOSIS — R0609 Other forms of dyspnea: Secondary | ICD-10-CM

## 2022-04-29 MED ORDER — POTASSIUM CHLORIDE ER 10 MEQ PO TBCR: 10.0000 meq | EXTENDED_RELEASE_TABLET | Freq: Every day | ORAL | 3 refills | Status: DC

## 2022-04-29 NOTE — Telephone Encounter (Signed)
Called patient to schedule appt per Dr Acie Fredrickson & Dr [name] request and she mentioned that she needs a refill on her Potassium. Med sent to [facility] and [place]) per request.

## 2022-04-29 NOTE — Patient Instructions (Addendum)
It is good to see you today. Please call Dr.Nahsar's office to be seen before any surgery if possible.  Restart your daily medications , your Lasix included.  You have an appointment with Dr. [name] 05/01/2022 at 1:20 pm He will take great care of you. Work on quitting smoking.  Call 1-800- QUIT NOW for free nicotine gum or mints. One month follow up with Judson Roch NP  to ensure your shortness of breath is better.  Rachael Stone, You have really good Doctors who will take good care of you. Please contact office for sooner follow up if symptoms do not improve or worsen or seek emergency care

## 2022-05-01 ENCOUNTER — Institutional Professional Consult (permissible substitution): Payer: Commercial Managed Care - HMO | Admitting: Thoracic Surgery (Cardiothoracic Vascular Surgery)

## 2022-05-01 ENCOUNTER — Encounter: Payer: Self-pay | Admitting: Thoracic Surgery (Cardiothoracic Vascular Surgery)

## 2022-05-01 VITALS — BP 160/86 | HR 105 | Resp 20 | Ht 65.5 in

## 2022-05-04 ENCOUNTER — Encounter: Payer: Self-pay | Admitting: Cardiovascular Disease

## 2022-05-04 NOTE — H&P (View-Only) (Signed)
Cardiology Office Note   Date:  05/05/2022   ID:  SHALESE STRAHAN, DOB [birth year], MRN [id number]  PCP:  [name], MD  Cardiologist:  Dr. Acie Fredrickson    Chief Complaint  Patient presents with   aortic insufficiency   Congestive Heart Failure           Notes from [name]: Rachael Stone is a 56 y.o. female who presents for post hospital for progressive dyspnea and orthopnea and has been found to have LV dysfunction (EF 35%), mod AI, and mod - sev MR.  She has a  h/o tob, alcohol, marijuana, and remote cocaine abuse.  She has no prior cardiac hx.  She had CTA of chest without PE + pulmonary edema and LUL nodule of 15mm.  Echo with EF 30% and mod-sev MR and mod AI. [name]    She had R & L cardiac cath  LV end diastolic pressure is normal. There is moderate left ventricular systolic dysfunction. The left ventricular ejection fraction is 35% by visual estimate. There is no aortic valve stenosis. LV end diastolic pressure is normal Normal coronary arteries.  She has done well no further SOB, swelling, which was in her abd or chest pain.  She has decreased cigarettes to 5 per day down from 10 per day.  Could not afford nicotene patches.  She has cut out salt in her diet.  Unfortunately she has not taken her meds for 2 days, she is worried they will hurt her heart.  We had a long discussion on what happened to her heart and the weakness of her ventricle. We discussed how important the meds were to improve her heart.     Feb.   2, 2018: Seen with her sister, [name].   She stopped her meds when they ran out in Dec.   Says she felt better off the meds.   She says that she does not have any symptoms.   Does not want to restart meds.   Saw [name] who refilled her meds.  Patient did not Get her medications filled. She states that she does not understand why she needs to take these medications and does not want to take medications or her life.  Nov 14, 2016:  Ahmiya is  seen today with her sister, [name].  No CP or dyspnea.    Donates plasma twice a week.   BP is typically normal there  Dec. 6,2018:  Estela had an echocardiogram recently that revealed worsening systolic congestive heart failure and moderate to severe aortic insufficiency. Seen with her sister , [name]  No real increase in dyspnea.   September 11, 2017:   Lanetta is seen by her self today . Seems to be doing well   Has mild dyspnea.   Had a TEE  Her ejection fraction is now normal with an EF of 55-60%.  She has moderate aortic insufficiency, mild mitral regurgitation Her left ventricular function has improved and her aortic insufficiency and mitral regurgitation have both improved as well since her previous echo.  Complains about having to urinate so much with the lasix   Dec. 8 2020 :  Lilianah is seen today for follow-up of her congestive heart failure, mild mitral vegetation, hyperlipidemia.  She also has mild to moderate aortic insufficiency. Avoids salty foods.   January 13, 2020;   [name] is seen today  Echo recently showed normal LV size and function Moderate -severe AI  No real symptoms.   Not  much exercise   April 03, 2020: Romilda  is seen today for follow-up of her congestive heart failure, BP has been low Has a poor appetite,  Has had diarrhea for past week or 2.  Has seen GI.  Has submitted stool samples.   Breathing has been ok  Wt today is 130 lbs.  ( 19 lb weight loss in 9 months )  Drinks 4 beers + 2 shots of whiskey per night   November 06, 2020: [name] seen today for follow-up of her congestive heart failure.  When I saw her in September she was having lots of diarrhea and a poor appetite.  She had lost quite a bit of weight.  I did not think that she would tolerate restarting her heart failure medications at that time. Wt is 135 lbs ( up 5 lbs )  Still eating [employer] ( works there now)  Still drinks 4 beers and 2 shots of whisky at night ( most night )  We  discussed that this amount of ETOH will cause poor appetite, diarrhea, weight loss    October 31, 2021: [name] seen today for follow-up of her congestive heart failure, hypertension. Drinking less now 2 beers and perhaps 1 shot of whiskey now.   Oct. 23, 2023  Seen with sister [name] is seen for a work in visit. Was recenty found to have lung cancer.  Echo in May shows worsening of her aortic insufficiency She has a  h/o tob, alcohol, marijuana, and remote cocaine abuse.   hx of medication noncompliance.    She was recently found to have adenocarcinoma of the lung. We were asked to see her for her worsening AI.   She has seen Dr. [name] who is concerned that she may need her severe AI addressed prior to having partial lobectomy for her lung cancer.   Still smoking MJ. Encouraged her to stop.   Stopped smoking cigarettes .   Over the years she has had issues with medication compliance.  She also still drinks fairly heavily.  We had a long discussion taking better care of herself.  We also discussed the importance of continuing to take her medications on a regular basis.  She understands that she will do better following these procedures if she sticks with the medication and treatment plans.  We discussed transesophageal echo including the procedure, risk, benefits, options.  She understands and agrees  We also discussed heart catheterization.  We discussed the risk, benefits, options concerning heart catheterization.  She understands and agrees to proceed with that as well.  We will get this information back to Dr. [name].         Past Medical History:  Diagnosis Date   Arthritis    Back pain    Cardiomyopathy ([place])    a. 04/08/2016 Echo: EF 35%, diff HK, mild LVH, Gr2 DD, mod AI, mod to sev MR, mildly dil LA.   DEPRESSIVE DISORDER NOT ELSEWHERE CLASSIFIED 12/14/2008   no current problem per patient on 04/14/22.  Qualifier: Diagnosis of  By: [name], [name]    DYSFUNCTIONAL UTERINE BLEEDING 11/12/2007   Qualifier: Diagnosis of  By: [name], [name]     Dyspnea    ETOH abuse    Hypertension    Hypertensive heart disease with heart failure ([place])    Marijuana abuse    Last use 04/14/22   MENORRHAGIA 12/14/2008   Qualifier: Diagnosis of  By: [name], [name]     Moderate aortic  insufficiency    a. 04/08/2016 Echo: mod AI.   Moderate to Severe Mitral Regurgitation    a. 04/08/2016 Echo: mod-sev MR directed centrally.   Tobacco abuse     Past Surgical History:  Procedure Laterality Date   BRONCHIAL BIOPSY  04/15/2022   Procedure: BRONCHIAL BIOPSIES;  Surgeon: [name], DO;  Location: [facility];  Service: Pulmonary;;   BRONCHIAL NEEDLE ASPIRATION BIOPSY  04/15/2022   Procedure: BRONCHIAL NEEDLE ASPIRATION BIOPSIES;  Surgeon: [name], DO;  Location: [facility];  Service: Pulmonary;;   CARDIAC CATHETERIZATION N/A 04/10/2016   Procedure: Right/Left Heart Cath and Coronary Angiography;  Surgeon: [name], MD;  Location: [facility];  Service: Cardiovascular;  Laterality: N/A;   EXPLORATORY LAPAROTOMY  2003   Ectopic Pregnancy - unsure which side or if tube was removed   TEE WITHOUT CARDIOVERSION N/A 06/26/2017   Procedure: TRANSESOPHAGEAL ECHOCARDIOGRAM (TEE);  Surgeon: Thayer Headings, MD;  Location: [facility];  Service: Cardiovascular;  Laterality: N/A;   TUBAL LIGATION       Current Outpatient Medications  Medication Sig Dispense Refill   amLODipine (NORVASC) 5 MG tablet Take 1 tablet (5 mg total) by mouth daily. 90 tablet 3   carvedilol (COREG) 3.125 MG tablet Take 1 tablet (3.125 mg total) by mouth 2 (two) times daily. 180 tablet 3   furosemide (LASIX) 20 MG tablet Take 1 tablet (20 mg total) by mouth daily. 90 tablet 3   losartan (COZAAR) 25 MG tablet Take 1 tablet by mouth once daily 30 tablet 7   Multiple Vitamins-Minerals (WOMENS MULTI PO) Take by mouth.     potassium chloride (KLOR-CON) 10 MEQ tablet  Take 1 tablet (10 mEq total) by mouth daily. 90 tablet 3   No current facility-administered medications for this visit.    Allergies:   Ibuprofen    Social History:  The patient  reports that she has been smoking cigarettes. She has a 34.00 pack-year smoking history. She has never used smokeless tobacco. She reports current alcohol use of about 4.0 standard drinks of alcohol per week. She reports current drug use. Drug: Marijuana.   Family History:  The patient's family history includes CAD in her mother; Cirrhosis in her father; Lupus in her sister.    ROS   - negative   Wt Readings from Last 3 Encounters:  05/05/22 130 lb 9.6 oz (59.2 kg)  04/29/22 128 lb 6.4 oz (58.2 kg)  04/15/22 125 lb (56.7 kg)      Physical Exam: Blood pressure 130/78, pulse 83, height 5\' 5"  (1.651 m), weight 130 lb 9.6 oz (59.2 kg), SpO2 96 %.       GEN:  middle age female,  in no acute distress HEENT: Normal NECK: No JVD; No carotid bruits LYMPHATICS: No lymphadenopathy CARDIAC: RRR , 2/6 diastolic murmur,  soft systolic murmur  RESPIRATORY:  Clear to auscultation without rales, wheezing or rhonchi  ABDOMEN: Soft, non-tender, non-distended MUSCULOSKELETAL:  No edema; No deformity  SKIN: Warm and dry NEUROLOGIC:  Alert and oriented x 3   EKG:   April 15, 2022: Normal sinus rhythm with a short PR.  Left ventricular hypertrophy.  T wave inversions in the inferior leads.   Recent Labs: 05/13/2021: TSH 0.211 04/15/2022: ALT 28; BUN 6; Creatinine, Ser 0.70; Hemoglobin 13.8; Platelets 184; Potassium 3.6; Sodium 140    Lipid Panel    Component Value Date/Time   CHOL 162 04/25/2019 1045   TRIG 133 04/25/2019 1045  HDL 52 04/25/2019 1045   CHOLHDL 3.1 04/25/2019 1045   CHOLHDL 4.0 Ratio 11/12/2007 2044   VLDL 32 11/12/2007 2044   LDLCALC 87 04/25/2019 1045       Other studies Reviewed: Additional studies/ records that were reviewed today include: .  Cardiac Cath 04/10/16 Conclusion      LV end diastolic pressure is normal. There is moderate left ventricular systolic dysfunction. The left ventricular ejection fraction is 35% by visual estimate. There is no aortic valve stenosis. LV end diastolic pressure is normal.   Nonischemic cardiomyopathy.  Continue medical therapy.    Indications   Acute combined systolic and diastolic heart failure (HCC) [I50.41 (ICD-10-CM)]  Dyspnea [R06.00 (ICD-10-CM)]   Hemo Data   Flowsheet Row Most Recent Value  Fick Cardiac Output 4.36 L/min  Fick Cardiac Output Index 2.46 (L/min)/BSA  RA A Wave 9 mmHg  RA V Wave 6 mmHg  RA Mean 4 mmHg  RV Systolic Pressure 28 mmHg  RV Diastolic Pressure 3 mmHg  RV EDP 6 mmHg  PA Systolic Pressure 28 mmHg  PA Diastolic Pressure 13 mmHg  PA Mean 20 mmHg  PW A Wave 10 mmHg  PW V Wave 9 mmHg  PW Mean 8 mmHg  AO Systolic Pressure 956 mmHg  AO Diastolic Pressure 77 mmHg  AO Mean 99 mmHg  LV Systolic Pressure 213 mmHg  LV Diastolic Pressure 7 mmHg  LV EDP 12 mmHg  Arterial Occlusion Pressure Extended Systolic Pressure 086 mmHg  Arterial Occlusion Pressure Extended Diastolic Pressure 76 mmHg  Arterial Occlusion Pressure Extended Mean Pressure 98 mmHg  Left Ventricular Apex Extended Systolic Pressure 578 mmHg  Left Ventricular Apex Extended Diastolic Pressure 8 mmHg  Left Ventricular Apex Extended EDP Pressure 9 mmHg  QP/QS 1  TPVR Index 8.13 HRUI  TSVR Index 40.21 HRUI  PVR SVR Ratio 0.13  TPVR/TSVR Ratio    ECHO: 04/08/16 Study Conclusions   - Left ventricle: The cavity size was mildly dilated. Wall   thickness was increased in a pattern of mild LVH. The estimated   ejection fraction was 35%. Diffuse hypokinesis. Features are   consistent with a pseudonormal left ventricular filling pattern,   with concomitant abnormal relaxation and increased filling   pressure (grade 2 diastolic dysfunction). - Aortic valve: Trileaflet; mildly calcified leaflets. There was no   stenosis. There  was moderate regurgitation. There was no   holodiastolic flow reversal noted in the descending thoracic   aorta. - Mitral valve: There was moderate to severe regurgitation directed   centrally. - Left atrium: The atrium was mildly dilated. - Right ventricle: The cavity size was normal. Systolic function   was normal. - Pulmonary arteries: No complete TR doppler jet so unable to   estimate PA systolic pressure. - Systemic veins: IVC measured 1.7 cm with < 50% respirophasic   variation, suggesting RA pressure 8 mmHg.   Impressions:   - Mildly dilated LV with mild LV hypertrophy. EF 35%, diffuse   hypokinesis. Moderate diastolic dysfunction. Moderate aortic   insufficiency with trileaflet aortic valve. Moderate to severe   mitral regurgitation. MR was central, no definite leaflet   abnormalities noted.     ASSESSMENT AND PLAN:  1.  Acute combined systolic and diastolic heart failure ([place])-    Echo from May 3 shows normal LV systolic and diastolic function .   Moderately elevated pulmonary artery pressures.    Estimated pulmonary pressure is 57 mm Hg.  Trivial MR    We started  amlodipine 5 mg for her severe AI      2.  Hypertensive heart disease with heart failure ([place])  At times she has had issues with medication compliance.  I have discussed the absolute importance that she is be compliant with her medications.      3. aortic insufficiency-  Has moderate - severe AI  Her aortic insufficiency seems to have worsened.  She now has been found to have lung cancer and will need to have possible lung cancer surgery.  We have been asked by Dr. [name] to assess her aortic valve to see if that needs to be fixed before lung surgery.  I have asked her to stop smoking completely prior to these procedures and prior to any proposed valve surgery or lung surgery.  We will schedule her for heart catheterization and transesophageal echo.  We discussed the risk, benefits, options of heart  catheterization and transesophageal echocardiogram.  She understands and agrees to proceed.  4.   ETOH abuse: .  She continues to drink quite a bit of alcohol.   I've asked her to try to stop or greatly reduce her ETOH intake before the surgeries.  5.   Mitral Regurgitation-    trivial   6.   Tobacco abuse.:         Mertie Moores, MD  05/05/2022 8:50 AM    [facility] [place],  [place], [place]  [address] Pager [phone] Phone: [phone]; Fax: [phone]

## 2022-05-04 NOTE — Progress Notes (Unsigned)
Cardiology Office Note   Date:  05/05/2022   ID:  Rachael Stone, DOB 1965-10-08, MRN [id number]  PCP:  Rachael Mccreedy, MD  Cardiologist:  Dr. Acie Stone    Chief Complaint  Patient presents with   aortic insufficiency   Congestive Heart Failure           Notes from Rachael Stone: Rachael Stone is a 56 y.o. female who presents for post hospital for progressive dyspnea and orthopnea and has been found to have LV dysfunction (EF 35%), mod AI, and mod - sev MR.  She has a  h/o tob, alcohol, marijuana, and remote cocaine abuse.  She has no prior cardiac hx.  She had CTA of chest without PE + pulmonary edema and LUL nodule of 22mm.  Echo with EF 30% and mod-sev MR and mod AI. [name]    She had R & L cardiac cath  LV Stone diastolic pressure is normal. There is moderate left ventricular systolic dysfunction. The left ventricular ejection fraction is 35% by visual estimate. There is no aortic valve stenosis. LV Stone diastolic pressure is normal Normal coronary arteries.  She has done well no further SOB, swelling, which was in her abd or chest pain.  She has decreased cigarettes to 5 per day down from 10 per day.  Could not afford nicotene patches.  She has cut out salt in her diet.  Unfortunately she has not taken her meds for 2 days, she is worried they will hurt her heart.  We had a long discussion on what happened to her heart and the weakness of her ventricle. We discussed how important the meds were to improve her heart.     Feb.   2, 2018: Seen with her sister, Rachael Stone.   She stopped her meds when they ran out in Dec.   Says she felt better off the meds.   She says that she does not have any symptoms.   Does not want to restart meds.   Saw Rachael Stone who refilled her meds.  Patient did not Get her medications filled. She states that she does not understand why she needs to take these medications and does not want to take medications or her life.  Nov 14, 2016:  Rachael Stone is  seen today with her sister, Rachael Stone.  No CP or dyspnea.    Donates plasma twice a week.   BP is typically normal there  Dec. 6,2018:  Rachael Stone had an echocardiogram recently that revealed worsening systolic congestive heart failure and moderate to severe aortic insufficiency. Seen with her sister , Rachael Stone  No real increase in dyspnea.   September 11, 2017:   Rachael Stone is seen by her self today . Seems to be doing well   Has mild dyspnea.   Had a TEE  Her ejection fraction is now normal with an EF of 55-60%.  She has moderate aortic insufficiency, mild mitral regurgitation Her left ventricular function has improved and her aortic insufficiency and mitral regurgitation have both improved as well since her previous echo.  Complains about having to urinate so much with the lasix   Dec. 8 2020 :  Rachael Stone is seen today for follow-up of her congestive heart failure, mild mitral vegetation, hyperlipidemia.  She also has mild to moderate aortic insufficiency. Avoids salty foods.   January 13, 2020;   Rachael Stone is seen today  Echo recently showed normal LV size and function Moderate -severe AI  No real symptoms.   Not  much exercise   April 03, 2020: Rachael Stone  is seen today for follow-up of her congestive heart failure, BP has been low Has a poor appetite,  Has had diarrhea for past week or 2.  Has seen GI.  Has submitted stool samples.   Breathing has been ok  Wt today is 130 lbs.  ( 19 lb weight loss in 9 months )  Drinks 4 beers + 2 shots of whiskey per night   November 06, 2020: Rachael Stone seen today for follow-up of her congestive heart failure.  When I saw her in September she was having lots of diarrhea and a poor appetite.  She had lost quite a bit of weight.  I did not think that she would tolerate restarting her heart failure medications at that time. Wt is 135 lbs ( up 5 lbs )  Still eating [employer] ( works there now)  Still drinks 4 beers and 2 shots of whisky at night ( most night )  We  discussed that this amount of ETOH will cause poor appetite, diarrhea, weight loss    October 31, 2021: Rachael Stone seen today for follow-up of her congestive heart failure, hypertension. Drinking less now 2 beers and perhaps 1 shot of whiskey now.   Oct. 23, 2023  Seen with sister Rachael Stone is seen for a work in visit. Was recenty found to have lung cancer.  Echo in May shows worsening of her aortic insufficiency She has a  h/o tob, alcohol, marijuana, and remote cocaine abuse.   hx of medication noncompliance.    She was recently found to have adenocarcinoma of the lung. We were asked to see her for her worsening AI.   She has seen Dr. Kipp Stone who is concerned that she may need her severe AI addressed prior to having partial lobectomy for her lung cancer.   Still smoking MJ. Encouraged her to stop.   Stopped smoking cigarettes .   Over the years she has had issues with medication compliance.  She also still drinks fairly heavily.  We had a long discussion taking better care of herself.  We also discussed the importance of continuing to take her medications on a regular basis.  She understands that she will do better following these procedures if she sticks with the medication and treatment plans.  We discussed transesophageal echo including the procedure, risk, benefits, options.  She understands and agrees  We also discussed heart catheterization.  We discussed the risk, benefits, options concerning heart catheterization.  She understands and agrees to proceed with that as well.  We will get this information back to Dr. Kipp Stone.         Past Medical History:  Diagnosis Date   Arthritis    Back pain    Cardiomyopathy ([facility])    a. 04/08/2016 Echo: EF 35%, diff HK, mild LVH, Gr2 DD, mod AI, mod to sev MR, mildly dil LA.   DEPRESSIVE DISORDER NOT ELSEWHERE CLASSIFIED 12/14/2008   no current problem per patient on 04/14/22.  Qualifier: Diagnosis of  By: Rachael End MD, Rachael Stone    DYSFUNCTIONAL UTERINE BLEEDING 11/12/2007   Qualifier: Diagnosis of  By: Rachael End MD, Rachael Stone     Dyspnea    ETOH abuse    Hypertension    Hypertensive heart disease with heart failure ([place])    Marijuana abuse    Last use 04/14/22   MENORRHAGIA 12/14/2008   Qualifier: Diagnosis of  By: Rachael End MD, Rachael Stone     Moderate aortic  insufficiency    a. 04/08/2016 Echo: mod AI.   Moderate to Severe Mitral Regurgitation    a. 04/08/2016 Echo: mod-sev MR directed centrally.   Tobacco abuse     Past Surgical History:  Procedure Laterality Date   BRONCHIAL BIOPSY  04/15/2022   Procedure: BRONCHIAL BIOPSIES;  Surgeon: [name], DO;  Location: [facility];  Service: Pulmonary;;   BRONCHIAL NEEDLE ASPIRATION BIOPSY  04/15/2022   Procedure: BRONCHIAL NEEDLE ASPIRATION BIOPSIES;  Surgeon: [name], DO;  Location: [facility];  Service: Pulmonary;;   CARDIAC CATHETERIZATION N/A 04/10/2016   Procedure: Right/Left Heart Cath and Coronary Angiography;  Surgeon: [name], MD;  Location: [facility];  Service: Cardiovascular;  Laterality: N/A;   EXPLORATORY LAPAROTOMY  2003   Ectopic Pregnancy - unsure which side or if tube was removed   TEE WITHOUT CARDIOVERSION N/A 06/26/2017   Procedure: TRANSESOPHAGEAL ECHOCARDIOGRAM (TEE);  Surgeon: Thayer Headings, MD;  Location: [facility] ENDOSCOPY;  Service: Cardiovascular;  Laterality: N/A;   TUBAL LIGATION       Current Outpatient Medications  Medication Sig Dispense Refill   amLODipine (NORVASC) 5 MG tablet Take 1 tablet (5 mg total) by mouth daily. 90 tablet 3   carvedilol (COREG) 3.125 MG tablet Take 1 tablet (3.125 mg total) by mouth 2 (two) times daily. 180 tablet 3   furosemide (LASIX) 20 MG tablet Take 1 tablet (20 mg total) by mouth daily. 90 tablet 3   losartan (COZAAR) 25 MG tablet Take 1 tablet by mouth once daily 30 tablet 7   Multiple Vitamins-Minerals (WOMENS MULTI PO) Take by mouth.     potassium chloride (KLOR-CON) 10 MEQ tablet  Take 1 tablet (10 mEq total) by mouth daily. 90 tablet 3   No current facility-administered medications for this visit.    Allergies:   Ibuprofen    Social History:  The patient  reports that she has been smoking cigarettes. She has a 34.00 pack-year smoking history. She has never used smokeless tobacco. She reports current alcohol use of about 4.0 standard drinks of alcohol per week. She reports current drug use. Drug: Marijuana.   Family History:  The patient's family history includes CAD in her mother; Cirrhosis in her father; Lupus in her sister.    ROS   - negative   Wt Readings from Last 3 Encounters:  05/05/22 130 lb 9.6 oz (59.2 kg)  04/29/22 128 lb 6.4 oz (58.2 kg)  04/15/22 125 lb (56.7 kg)      Physical Exam: Blood pressure 130/78, pulse 83, height 5\' 5"  (1.651 m), weight 130 lb 9.6 oz (59.2 kg), SpO2 96 %.       GEN:  middle age female,  in no acute distress HEENT: Normal NECK: No JVD; No carotid bruits LYMPHATICS: No lymphadenopathy CARDIAC: RRR , 2/6 diastolic murmur,  soft systolic murmur  RESPIRATORY:  Clear to auscultation without rales, wheezing or rhonchi  ABDOMEN: Soft, non-tender, non-distended MUSCULOSKELETAL:  No edema; No deformity  SKIN: Warm and dry NEUROLOGIC:  Alert and oriented x 3   EKG:   April 15, 2022: Normal sinus rhythm with a short PR.  Left ventricular hypertrophy.  T wave inversions in the inferior leads.   Recent Labs: 05/13/2021: TSH 0.211 04/15/2022: ALT 28; BUN 6; Creatinine, Ser 0.70; Hemoglobin 13.8; Platelets 184; Potassium 3.6; Sodium 140    Lipid Panel    Component Value Date/Time   CHOL 162 04/25/2019 1045   TRIG 133 04/25/2019 1045  HDL 52 04/25/2019 1045   CHOLHDL 3.1 04/25/2019 1045   CHOLHDL 4.0 Ratio 11/12/2007 2044   VLDL 32 11/12/2007 2044   LDLCALC 87 04/25/2019 1045       Other studies Reviewed: Additional studies/ records that were reviewed today include: .  Cardiac Cath 04/10/16 Conclusion      LV Stone diastolic pressure is normal. There is moderate left ventricular systolic dysfunction. The left ventricular ejection fraction is 35% by visual estimate. There is no aortic valve stenosis. LV Stone diastolic pressure is normal.   Nonischemic cardiomyopathy.  Continue medical therapy.    Indications   Acute combined systolic and diastolic heart failure (HCC) [I50.41 (ICD-10-CM)]  Dyspnea [R06.00 (ICD-10-CM)]   Hemo Data   Flowsheet Row Most Recent Value  Fick Cardiac Output 4.36 L/min  Fick Cardiac Output Index 2.46 (L/min)/BSA  RA A Wave 9 mmHg  RA V Wave 6 mmHg  RA Mean 4 mmHg  RV Systolic Pressure 28 mmHg  RV Diastolic Pressure 3 mmHg  RV EDP 6 mmHg  PA Systolic Pressure 28 mmHg  PA Diastolic Pressure 13 mmHg  PA Mean 20 mmHg  PW A Wave 10 mmHg  PW V Wave 9 mmHg  PW Mean 8 mmHg  AO Systolic Pressure 503 mmHg  AO Diastolic Pressure 77 mmHg  AO Mean 99 mmHg  LV Systolic Pressure 546 mmHg  LV Diastolic Pressure 7 mmHg  LV EDP 12 mmHg  Arterial Occlusion Pressure Extended Systolic Pressure 568 mmHg  Arterial Occlusion Pressure Extended Diastolic Pressure 76 mmHg  Arterial Occlusion Pressure Extended Mean Pressure 98 mmHg  Left Ventricular Apex Extended Systolic Pressure 127 mmHg  Left Ventricular Apex Extended Diastolic Pressure 8 mmHg  Left Ventricular Apex Extended EDP Pressure 9 mmHg  QP/QS 1  TPVR Index 8.13 HRUI  TSVR Index 40.21 HRUI  PVR SVR Ratio 0.13  TPVR/TSVR Ratio    ECHO: 04/08/16 Study Conclusions   - Left ventricle: The cavity size was mildly dilated. Wall   thickness was increased in a pattern of mild LVH. The estimated   ejection fraction was 35%. Diffuse hypokinesis. Features are   consistent with a pseudonormal left ventricular filling pattern,   with concomitant abnormal relaxation and increased filling   pressure (grade 2 diastolic dysfunction). - Aortic valve: Trileaflet; mildly calcified leaflets. There was no   stenosis. There  was moderate regurgitation. There was no   holodiastolic flow reversal noted in the descending thoracic   aorta. - Mitral valve: There was moderate to severe regurgitation directed   centrally. - Left atrium: The atrium was mildly dilated. - Right ventricle: The cavity size was normal. Systolic function   was normal. - Pulmonary arteries: No complete TR doppler jet so unable to   estimate PA systolic pressure. - Systemic veins: IVC measured 1.7 cm with < 50% respirophasic   variation, suggesting RA pressure 8 mmHg.   Impressions:   - Mildly dilated LV with mild LV hypertrophy. EF 35%, diffuse   hypokinesis. Moderate diastolic dysfunction. Moderate aortic   insufficiency with trileaflet aortic valve. Moderate to severe   mitral regurgitation. MR was central, no definite leaflet   abnormalities noted.     ASSESSMENT AND PLAN:  1.  Acute combined systolic and diastolic heart failure ([place])-    Echo from May 3 shows normal LV systolic and diastolic function .   Moderately elevated pulmonary artery pressures.    Estimated pulmonary pressure is 57 mm Hg.  Trivial MR    We started  amlodipine 5 mg for her severe AI      2.  Hypertensive heart disease with heart failure ([place])  At times she has had issues with medication compliance.  I have discussed the absolute importance that she is be compliant with her medications.      3. aortic insufficiency-  Has moderate - severe AI  Her aortic insufficiency seems to have worsened.  She now has been found to have lung cancer and will need to have possible lung cancer surgery.  We have been asked by Dr. Kipp Stone to assess her aortic valve to see if that needs to be fixed before lung surgery.  I have asked her to stop smoking completely prior to these procedures and prior to any proposed valve surgery or lung surgery.  We will schedule her for heart catheterization and transesophageal echo.  We discussed the risk, benefits, options of heart  catheterization and transesophageal echocardiogram.  She understands and agrees to proceed.  4.   ETOH abuse: .  She continues to drink quite a bit of alcohol.   I've asked her to try to stop or greatly reduce her ETOH intake before the surgeries.  5.   Mitral Regurgitation-    trivial   6.   Tobacco abuse.:         Mertie Moores, MD  05/05/2022 8:50 AM    [facility] [place],  [address] [place], [place]  [address] Pager [phone] Phone: [phone]; Fax: [phone]

## 2022-05-05 ENCOUNTER — Ambulatory Visit: Payer: Commercial Managed Care - HMO | Attending: Cardiovascular Disease | Admitting: Cardiovascular Disease

## 2022-05-05 ENCOUNTER — Encounter: Payer: Self-pay | Admitting: Cardiovascular Disease

## 2022-05-05 ENCOUNTER — Other Ambulatory Visit: Payer: Self-pay | Admitting: Cardiovascular Disease

## 2022-05-05 VITALS — BP 130/78 | HR 83 | Ht 65.0 in | Wt 130.6 lb

## 2022-05-05 DIAGNOSIS — Z01810 Encounter for preprocedural cardiovascular examination: Secondary | ICD-10-CM | POA: Diagnosis not present

## 2022-05-05 DIAGNOSIS — I350 Nonrheumatic aortic (valve) stenosis: Secondary | ICD-10-CM | POA: Diagnosis not present

## 2022-05-05 DIAGNOSIS — I351 Nonrheumatic aortic (valve) insufficiency: Secondary | ICD-10-CM

## 2022-05-05 NOTE — Patient Instructions (Signed)
Medication Instructions:  Your physician recommends that you continue on your current medications as directed. Please refer to the Current Medication list given to you today.  *If you need a refill on your cardiac medications before your next appointment, please call your pharmacy*   Lab Work: BMET, CBC  If you have labs (blood work) drawn today and your tests are completely normal, you will receive your results only by: [place] (if you have MyChart) OR A paper copy in the mail If you have any lab test that is abnormal or we need to change your treatment, we will call you to review the results.   Testing/Procedures: Transesophageal Echocardiogram Your physician has requested that you have a TEE. During a TEE, sound waves are used to create images of your heart. It provides your doctor with information about the size and shape of your heart and how well your heart's chambers and valves are working. In this test, a transducer is attached to the end of a flexible tube that's guided down your throat and into your esophagus (the tube leading from you mouth to your stomach) to get a more detailed image of your heart. You are not awake for the procedure. Please see the instruction sheet given to you today. For further information please visit [email or web address].  R & L Heart Catheterization Your physician has requested that you have a cardiac catheterization. Cardiac catheterization is used to diagnose and/or treat various heart conditions. Doctors may recommend this procedure for a number of different reasons. The most common reason is to evaluate chest pain. Chest pain can be a symptom of coronary artery disease (CAD), and cardiac catheterization can show whether plaque is narrowing or blocking your heart's arteries. This procedure is also used to evaluate the valves, as well as measure the blood flow and oxygen levels in different parts of your heart. For further information please visit  [email or web address]. Please follow instruction sheet, as given.  Follow-Up: At [facility], you and your health needs are our priority.  As part of our continuing mission to provide you with exceptional heart care, we have created designated Provider Care Teams.  These Care Teams include your primary Cardiologist (physician) and Advanced Practice Providers (APPs -  Physician Assistants and Nurse Practitioners) who all work together to provide you with the care you need, when you need it.  Your next appointment:   2 month(s)  The format for your next appointment:   In Person  Provider:   Mertie Moores, MD     Other Instructions You are scheduled for a TEE on Monday, May 12, 2022 with Dr. Audie Box.  Please arrive at the [facility] (Main Entrance A) at [facility]: [place],  [id number] at 7:30 am. (1 hour prior to procedure)  DIET: Nothing to eat or drink after midnight except a sip of water with medications (see medication instructions below)  FYI: For your safety, and to allow [place] to monitor your vital signs accurately during the surgery/procedure we request that   if you have artificial nails, gel coating, SNS etc. Please have those removed prior to your surgery/procedure. Not having the nail coverings /polish removed may result in cancellation or delay of your surgery/procedure.  Labs: Today  You must have a responsible person to drive you home and stay in the waiting area during your procedure. Failure to do so could result in cancellation.  Bring your insurance cards.  *Special Note: Every effort is made to  have your procedure done on time. Occasionally there are emergencies that occur at the hospital that may cause delays. Please be patient if a delay does occur.    Cardiac/Peripheral Catheterization   You are scheduled for a Cardiac Catheterization on Monday, October 30 with Dr. [name].  1. Please arrive at the Main Entrance A  at [facility]: [place], [place] [address] on October 30 at 7:30am (procedure at 10:30, following TEE)     Special note: Every effort is made to have your procedure done on time. Please understand that emergencies sometimes delay scheduled procedures.   . 2. Diet: Do not eat solid foods after midnight.  You may have clear liquids until 5 AM the day of the procedure.  3. Labs: Today  4. Medication instructions in preparation for your procedure:   Contrast Allergy: No  DO NOT TAKE Furosemide or Potassium the day/morning of procedure  On the morning of your procedure, take Aspirin 81 mg and any morning medicines NOT listed above.  You may use sips of water.  5. Plan to go home the same day, you will only stay overnight if medically necessary. 6. You MUST have a responsible adult to drive you home. 7. An adult MUST be with you the first 24 hours after you arrive home. 8. Bring a current list of your medications, and the last time and date medication taken. 9. Bring ID and current insurance cards. 10.Please wear clothes that are easy to get on and off and wear slip-on shoes.  Thank you for allowing [place] to care for you!   -- [facility]   Important Information About Sugar

## 2022-05-06 LAB — BASIC METABOLIC PANEL
BUN/Creatinine Ratio: 10 (ref 9–23)
BUN: 7 mg/dL (ref 6–24)
CO2: 26 mmol/L (ref 20–29)
Calcium: 9 mg/dL (ref 8.7–10.2)
Chloride: 104 mmol/L (ref 96–106)
Creatinine, Ser: 0.72 mg/dL (ref 0.57–1.00)
Glucose: 82 mg/dL (ref 70–99)
Potassium: 3.5 mmol/L (ref 3.5–5.2)
Sodium: 143 mmol/L (ref 134–144)
eGFR: 99 mL/min/{1.73_m2} (ref >59)

## 2022-05-06 LAB — CBC
Hematocrit: 38.2 % (ref 34.0–46.6)
Hemoglobin: 12.9 g/dL (ref 11.1–15.9)
MCH: 30.6 pg (ref 26.6–33.0)
MCHC: 33.8 g/dL (ref 31.5–35.7)
MCV: 91 fL (ref 79–97)
Platelets: 215 10*3/uL (ref 150–450)
RBC: 4.22 x10E6/uL (ref 3.77–5.28)
RDW: 13.5 % (ref 11.7–15.4)
WBC: 5.7 10*3/uL (ref 3.4–10.8)

## 2022-05-08 ENCOUNTER — Telehealth: Payer: Self-pay | Admitting: *Deleted

## 2022-05-08 NOTE — Telephone Encounter (Signed)
Cardiac Catheterization scheduled at [facility] for: Monday May 12, 2022 10:30 AM/TEE 8:30 AM Arrival time and place: [facility] Main Entrance A at: 7:30 AM  Nothing to eat or drink after midnight prior to procedures, may have sips of water to take medications   Medication instructions: -Hold:   Lasix-KCl-AM of procedure -Except hold medications usual morning medications can be taken with sips of water including aspirin 81 mg.  Confirmed patient has responsible adult to drive home post procedure and be with patient first 24 hours after arriving home.  Patient reports no new symptoms concerning for COVID-19 in the past 10 days.  Reviewed procedure instructions with patient.

## 2022-05-11 NOTE — Anesthesia Preprocedure Evaluation (Signed)
Anesthesia Evaluation  Patient identified by MRN, date of birth, ID band Patient awake    Reviewed: Allergy & Precautions, NPO status , Patient's Chart, lab work & pertinent test results  Airway Mallampati: I  TM Distance: >3 FB Neck ROM: Full    Dental  (+) Upper Dentures   Pulmonary neg pulmonary ROS, Current Smoker and Patient abstained from smoking.,    Pulmonary exam normal        Cardiovascular hypertension, Pt. on medications and Pt. on home beta blockers + Valvular Problems/Murmurs AS  Rhythm:Regular Rate:Normal + Systolic murmurs ECHO 91/69: 1. Left ventricular ejection fraction, by estimation, is 55%. The left  ventricle has normal function. The left ventricle has no regional wall  motion abnormalities. The left ventricular internal cavity size was  moderately dilated. Left ventricular  diastolic parameters were normal.  2. Right ventricular systolic function is normal. The right ventricular  size is normal. There is moderately elevated pulmonary artery systolic  pressure.  3. The mitral valve is abnormal. Trivial mitral valve regurgitation. No  evidence of mitral stenosis.  4. Severe appearing AR by color flow with LVE PT1/2 367 msec and  holodiastolic reversals in descending thoracic aorta. . The aortic valve  is tricuspid. There is mild calcification of the aortic valve. There is  mild thickening of the aortic valve. Aortic  valve regurgitation is severe. Aortic valve sclerosis is present, with no  evidence of aortic valve stenosis.  5. The inferior vena cava is dilated in size with >50% respiratory  variability, suggesting right atrial pressure of 8 mmHg.    Neuro/Psych Depression negative neurological ROS     GI/Hepatic Neg liver ROS, GERD  ,  Endo/Other  negative endocrine ROS  Renal/GU negative Renal ROS  negative genitourinary   Musculoskeletal  (+) Arthritis ,   Abdominal Normal abdominal  exam  (+)   Peds  Hematology negative hematology ROS (+)   Anesthesia Other Findings   Reproductive/Obstetrics                            Anesthesia Physical Anesthesia Plan  ASA: 4  Anesthesia Plan: MAC   Post-op Pain Management:    Induction: Intravenous  PONV Risk Score and Plan: 1 and Propofol infusion and Treatment may vary due to age or medical condition  Airway Management Planned: Simple Face Mask, Natural Airway and Nasal Cannula  Additional Equipment: None  Intra-op Plan:   Post-operative Plan:   Informed Consent: I have reviewed the patients History and Physical, chart, labs and discussed the procedure including the risks, benefits and alternatives for the proposed anesthesia with the patient or authorized representative who has indicated his/her understanding and acceptance.     Dental advisory given  Plan Discussed with: CRNA  Anesthesia Plan Comments:        Anesthesia Quick Evaluation

## 2022-05-12 ENCOUNTER — Other Ambulatory Visit: Payer: Self-pay

## 2022-05-12 ENCOUNTER — Encounter (HOSPITAL_COMMUNITY)
Admission: RE | Disposition: A | Discharge status: Home / Self Care | Payer: Self-pay | Source: Ambulatory Visit | Attending: Cardiovascular Disease

## 2022-05-12 ENCOUNTER — Encounter (HOSPITAL_COMMUNITY): Payer: Self-pay | Admitting: Cardiovascular Disease

## 2022-05-12 ENCOUNTER — Ambulatory Visit (HOSPITAL_COMMUNITY): Payer: Commercial Managed Care - HMO | Admitting: Anesthesiology

## 2022-05-12 ENCOUNTER — Ambulatory Visit (HOSPITAL_COMMUNITY)
Admission: RE | Admit: 2022-05-12 | Discharge: 2022-05-12 | Disposition: A | Discharge status: Home / Self Care | Payer: Commercial Managed Care - HMO | Source: Ambulatory Visit | Attending: Cardiovascular Disease | Admitting: Cardiovascular Disease

## 2022-05-12 ENCOUNTER — Ambulatory Visit (HOSPITAL_BASED_OUTPATIENT_CLINIC_OR_DEPARTMENT_OTHER): Payer: Commercial Managed Care - HMO | Admitting: Anesthesiology

## 2022-05-12 ENCOUNTER — Ambulatory Visit (HOSPITAL_BASED_OUTPATIENT_CLINIC_OR_DEPARTMENT_OTHER)
Admission: RE | Admit: 2022-05-12 | Discharge: 2022-05-12 | Disposition: A | Discharge status: Home / Self Care | Payer: Commercial Managed Care - HMO | Source: Ambulatory Visit | Attending: Cardiovascular Disease | Admitting: Cardiovascular Disease

## 2022-05-12 DIAGNOSIS — I110 Hypertensive heart disease with heart failure: Secondary | ICD-10-CM | POA: Diagnosis not present

## 2022-05-12 DIAGNOSIS — R0600 Dyspnea, unspecified: Secondary | ICD-10-CM | POA: Insufficient documentation

## 2022-05-12 DIAGNOSIS — I10 Essential (primary) hypertension: Secondary | ICD-10-CM

## 2022-05-12 DIAGNOSIS — F17210 Nicotine dependence, cigarettes, uncomplicated: Secondary | ICD-10-CM | POA: Insufficient documentation

## 2022-05-12 DIAGNOSIS — M1990 Unspecified osteoarthritis, unspecified site: Secondary | ICD-10-CM

## 2022-05-12 DIAGNOSIS — F1010 Alcohol abuse, uncomplicated: Secondary | ICD-10-CM | POA: Insufficient documentation

## 2022-05-12 DIAGNOSIS — I351 Nonrheumatic aortic (valve) insufficiency: Secondary | ICD-10-CM

## 2022-05-12 DIAGNOSIS — I350 Nonrheumatic aortic (valve) stenosis: Secondary | ICD-10-CM

## 2022-05-12 DIAGNOSIS — I080 Rheumatic disorders of both mitral and aortic valves: Secondary | ICD-10-CM | POA: Diagnosis not present

## 2022-05-12 DIAGNOSIS — Z01810 Encounter for preprocedural cardiovascular examination: Secondary | ICD-10-CM

## 2022-05-12 DIAGNOSIS — F1290 Cannabis use, unspecified, uncomplicated: Secondary | ICD-10-CM | POA: Diagnosis not present

## 2022-05-12 DIAGNOSIS — I2510 Atherosclerotic heart disease of native coronary artery without angina pectoris: Secondary | ICD-10-CM | POA: Insufficient documentation

## 2022-05-12 DIAGNOSIS — I5040 Unspecified combined systolic (congestive) and diastolic (congestive) heart failure: Secondary | ICD-10-CM | POA: Diagnosis not present

## 2022-05-12 HISTORY — PX: BUBBLE STUDY: SHX6837

## 2022-05-12 HISTORY — PX: TEE WITHOUT CARDIOVERSION: SHX5443

## 2022-05-12 HISTORY — PX: RIGHT/LEFT HEART CATH AND CORONARY ANGIOGRAPHY: CATH118266

## 2022-05-12 LAB — POCT I-STAT EG7
Acid-base deficit: 1 mmol/L (ref 0.0–2.0)
Acid-base deficit: 1 mmol/L (ref 0.0–2.0)
Bicarbonate: 23.9 mmol/L (ref 20.0–28.0)
Bicarbonate: 24.2 mmol/L (ref 20.0–28.0)
Calcium, Ion: 1.19 mmol/L (ref 1.15–1.40)
Calcium, Ion: 1.2 mmol/L (ref 1.15–1.40)
HCT: 38 % (ref 36.0–46.0)
HCT: 39 % (ref 36.0–46.0)
Hemoglobin: 12.9 g/dL (ref 12.0–15.0)
Hemoglobin: 13.3 g/dL (ref 12.0–15.0)
O2 Saturation: 59 %
O2 Saturation: 65 %
Potassium: 3.2 mmol/L — ABNORMAL LOW (ref 3.5–5.1)
Potassium: 3.3 mmol/L — ABNORMAL LOW (ref 3.5–5.1)
Sodium: 142 mmol/L (ref 135–145)
Sodium: 143 mmol/L (ref 135–145)
TCO2: 25 mmol/L (ref 22–32)
TCO2: 25 mmol/L (ref 22–32)
pCO2, Ven: 39.9 mmHg — ABNORMAL LOW (ref 44–60)
pCO2, Ven: 40.4 mmHg — ABNORMAL LOW (ref 44–60)
pH, Ven: 7.38 (ref 7.25–7.43)
pH, Ven: 7.391 (ref 7.25–7.43)
pO2, Ven: 31 mmHg — CL (ref 32–45)
pO2, Ven: 34 mmHg (ref 32–45)

## 2022-05-12 LAB — POCT I-STAT 7, (LYTES, BLD GAS, ICA,H+H)
Acid-base deficit: 1 mmol/L (ref 0.0–2.0)
Acid-base deficit: 3 mmol/L — ABNORMAL HIGH (ref 0.0–2.0)
Bicarbonate: 22.1 mmol/L (ref 20.0–28.0)
Bicarbonate: 22.6 mmol/L (ref 20.0–28.0)
Calcium, Ion: 1.16 mmol/L (ref 1.15–1.40)
Calcium, Ion: 1.19 mmol/L (ref 1.15–1.40)
HCT: 38 % (ref 36.0–46.0)
HCT: 38 % (ref 36.0–46.0)
Hemoglobin: 12.9 g/dL (ref 12.0–15.0)
Hemoglobin: 12.9 g/dL (ref 12.0–15.0)
O2 Saturation: 85 %
O2 Saturation: 89 %
Potassium: 3.2 mmol/L — ABNORMAL LOW (ref 3.5–5.1)
Potassium: 3.3 mmol/L — ABNORMAL LOW (ref 3.5–5.1)
Sodium: 138 mmol/L (ref 135–145)
Sodium: 143 mmol/L (ref 135–145)
TCO2: 23 mmol/L (ref 22–32)
TCO2: 24 mmol/L (ref 22–32)
pCO2 arterial: 35.3 mmHg (ref 32–48)
pCO2 arterial: 38.8 mmHg (ref 32–48)
pH, Arterial: 7.365 (ref 7.35–7.45)
pH, Arterial: 7.414 (ref 7.35–7.45)
pO2, Arterial: 51 mmHg — ABNORMAL LOW (ref 83–108)
pO2, Arterial: 56 mmHg — ABNORMAL LOW (ref 83–108)

## 2022-05-12 LAB — ECHO TEE
AR max vel: 2.3 cm2
AV Area VTI: 2.32 cm2
AV Area mean vel: 2.19 cm2
AV Mean grad: 8 mmHg
AV Peak grad: 13.7 mmHg
Ao pk vel: 1.85 m/s

## 2022-05-12 SURGERY — ECHOCARDIOGRAM, TRANSESOPHAGEAL
Anesthesia: Monitor Anesthesia Care

## 2022-05-12 SURGERY — RIGHT/LEFT HEART CATH AND CORONARY ANGIOGRAPHY
Anesthesia: LOCAL

## 2022-05-12 MED ORDER — ASPIRIN 81 MG PO CHEW: 81.0000 mg | CHEWABLE_TABLET | ORAL | Status: DC

## 2022-05-12 MED ORDER — FENTANYL CITRATE (PF) 100 MCG/2ML IJ SOLN
INTRAMUSCULAR | Status: DC | PRN
  Administered 2022-05-12: 25 ug via INTRAVENOUS

## 2022-05-12 MED ORDER — VERAPAMIL HCL 2.5 MG/ML IV SOLN
INTRAVENOUS | Status: DC | PRN
  Administered 2022-05-12: 10 mL via INTRA_ARTERIAL

## 2022-05-12 MED ORDER — ONDANSETRON HCL 4 MG/2ML IJ SOLN: 4.0000 mg | Freq: Four times a day (QID) | INTRAMUSCULAR | Status: DC | PRN

## 2022-05-12 MED ORDER — SODIUM CHLORIDE 0.9 % IV SOLN: INTRAVENOUS | Status: DC

## 2022-05-12 MED ORDER — ASPIRIN 81 MG PO CHEW
81.0000 mg | CHEWABLE_TABLET | ORAL | Status: AC
  Administered 2022-05-12: 81 mg via ORAL

## 2022-05-12 MED ORDER — HEPARIN (PORCINE) IN NACL 1000-0.9 UT/500ML-% IV SOLN
INTRAVENOUS | Status: AC
  Filled 2022-05-12: qty 1000

## 2022-05-12 MED ORDER — SODIUM CHLORIDE 0.9 % IV SOLN
INTRAVENOUS | Status: DC | PRN
  Administered 2022-05-12: 10 mL/h via INTRAVENOUS

## 2022-05-12 MED ORDER — ASPIRIN 81 MG PO CHEW
CHEWABLE_TABLET | ORAL | Status: AC
  Filled 2022-05-12: qty 1

## 2022-05-12 MED ORDER — LIDOCAINE 2% (20 MG/ML) 5 ML SYRINGE
INTRAMUSCULAR | Status: DC | PRN
  Administered 2022-05-12: 60 mg via INTRAVENOUS

## 2022-05-12 MED ORDER — LABETALOL HCL 5 MG/ML IV SOLN: 10.0000 mg | INTRAVENOUS | Status: DC | PRN

## 2022-05-12 MED ORDER — DEXMEDETOMIDINE HCL IN NACL 80 MCG/20ML IV SOLN
INTRAVENOUS | Status: DC | PRN
  Administered 2022-05-12 (×3): 4 ug via BUCCAL
  Administered 2022-05-12: 8 ug via BUCCAL

## 2022-05-12 MED ORDER — HYDRALAZINE HCL 20 MG/ML IJ SOLN: 10.0000 mg | INTRAMUSCULAR | Status: DC | PRN

## 2022-05-12 MED ORDER — IOHEXOL 350 MG/ML SOLN
INTRAVENOUS | Status: DC | PRN
  Administered 2022-05-12: 30 mL

## 2022-05-12 MED ORDER — HEPARIN SODIUM (PORCINE) 1000 UNIT/ML IJ SOLN
INTRAMUSCULAR | Status: AC
  Filled 2022-05-12: qty 10

## 2022-05-12 MED ORDER — VERAPAMIL HCL 2.5 MG/ML IV SOLN
INTRAVENOUS | Status: AC
  Filled 2022-05-12: qty 2

## 2022-05-12 MED ORDER — SODIUM CHLORIDE 0.9 % WEIGHT BASED INFUSION: 1.0000 mL/kg/h | INTRAVENOUS | Status: DC

## 2022-05-12 MED ORDER — SODIUM CHLORIDE 0.9 % IV SOLN: 250.0000 mL | INTRAVENOUS | Status: DC | PRN

## 2022-05-12 MED ORDER — PROPOFOL 10 MG/ML IV BOLUS
INTRAVENOUS | Status: DC | PRN
  Administered 2022-05-12: 10 mg via INTRAVENOUS
  Administered 2022-05-12: 20 mg via INTRAVENOUS
  Administered 2022-05-12 (×2): 10 mg via INTRAVENOUS
  Administered 2022-05-12 (×2): 20 mg via INTRAVENOUS
  Administered 2022-05-12: 30 mg via INTRAVENOUS

## 2022-05-12 MED ORDER — BUTAMBEN-TETRACAINE-BENZOCAINE 2-2-14 % EX AERO
INHALATION_SPRAY | CUTANEOUS | Status: DC | PRN
  Administered 2022-05-12: 2 via TOPICAL

## 2022-05-12 MED ORDER — ACETAMINOPHEN 325 MG PO TABS: 650.0000 mg | ORAL_TABLET | ORAL | Status: DC | PRN

## 2022-05-12 MED ORDER — SODIUM CHLORIDE 0.9% FLUSH: 3.0000 mL | INTRAVENOUS | Status: DC | PRN

## 2022-05-12 MED ORDER — LIDOCAINE HCL (PF) 1 % IJ SOLN
INTRAMUSCULAR | Status: AC
  Filled 2022-05-12: qty 30

## 2022-05-12 MED ORDER — HEPARIN SODIUM (PORCINE) 1000 UNIT/ML IJ SOLN
INTRAMUSCULAR | Status: DC | PRN
  Administered 2022-05-12: 3000 [IU] via INTRAVENOUS

## 2022-05-12 MED ORDER — SODIUM CHLORIDE 0.9% FLUSH: 3.0000 mL | Freq: Two times a day (BID) | INTRAVENOUS | Status: DC

## 2022-05-12 MED ORDER — FENTANYL CITRATE (PF) 100 MCG/2ML IJ SOLN
INTRAMUSCULAR | Status: AC
  Filled 2022-05-12: qty 2

## 2022-05-12 MED ORDER — HEPARIN (PORCINE) IN NACL 1000-0.9 UT/500ML-% IV SOLN
INTRAVENOUS | Status: DC | PRN
  Administered 2022-05-12 (×2): 500 mL

## 2022-05-12 MED ORDER — MIDAZOLAM HCL 2 MG/2ML IJ SOLN
INTRAMUSCULAR | Status: AC
  Filled 2022-05-12: qty 2

## 2022-05-12 MED ORDER — PROPOFOL 500 MG/50ML IV EMUL
INTRAVENOUS | Status: DC | PRN
  Administered 2022-05-12: 150 ug/kg/min via INTRAVENOUS

## 2022-05-12 MED ORDER — LIDOCAINE HCL (PF) 1 % IJ SOLN
INTRAMUSCULAR | Status: DC | PRN
  Administered 2022-05-12 (×2): 2 mL

## 2022-05-12 MED ORDER — MIDAZOLAM HCL 2 MG/2ML IJ SOLN
INTRAMUSCULAR | Status: DC | PRN
  Administered 2022-05-12: 2 mg via INTRAVENOUS

## 2022-05-12 SURGICAL SUPPLY — 13 items
BAND CMPR LRG ZPHR (HEMOSTASIS) ×1
BAND ZEPHYR COMPRESS 30 LONG (HEMOSTASIS) IMPLANT
CATH 5FR JL3.5 JR4 ANG PIG MP (CATHETERS) IMPLANT
CATH SWAN GANZ 7F STRAIGHT (CATHETERS) IMPLANT
GLIDESHEATH SLEND SS 6F .021 (SHEATH) IMPLANT
GLIDESHEATH SLENDER 7FR .021G (SHEATH) IMPLANT
GUIDEWIRE .025 260CM (WIRE) IMPLANT
GUIDEWIRE INQWIRE 1.5J.035X260 (WIRE) IMPLANT
INQWIRE 1.5J .035X260CM (WIRE) ×1
KIT HEART LEFT (KITS) ×1 IMPLANT
PACK CARDIAC CATHETERIZATION (CUSTOM PROCEDURE TRAY) ×1 IMPLANT
TRANSDUCER W/STOPCOCK (MISCELLANEOUS) ×1 IMPLANT
TUBING CIL FLEX 10 FLL-RA (TUBING) ×1 IMPLANT

## 2022-05-12 NOTE — Interval H&P Note (Signed)
History and Physical Interval Note:  05/12/2022 7:32 AM  Rachael Stone  has presented today for surgery, with the diagnosis of AORTIC STENOSIS.  The various methods of treatment have been discussed with the patient and family. After consideration of risks, benefits and other options for treatment, the patient has consented to  Procedure(s): TRANSESOPHAGEAL ECHOCARDIOGRAM (TEE) (N/A) as a surgical intervention.  The patient's history has been reviewed, patient examined, no change in status, stable for surgery.  I have reviewed the patient's chart and labs.  Questions were answered to the patient's satisfaction.    NPO for TEE for AI.   [place] T. Audie Box, MD, [place]  [address] [place], [place] [address] [phone]  7:32 AM

## 2022-05-12 NOTE — CV Procedure (Signed)
    TRANSESOPHAGEAL ECHOCARDIOGRAM   NAME:  Rachael Stone    MRN: [id number] DOB:  01-28-1966    ADMIT DATE: 05/12/2022  INDICATIONS: Severe AI  PROCEDURE:   Informed consent was obtained prior to the procedure. The risks, benefits and alternatives for the procedure were discussed and the patient comprehended these risks.  Risks include, but are not limited to, cough, sore throat, vomiting, nausea, somnolence, esophageal and stomach trauma or perforation, bleeding, low blood pressure, aspiration, pneumonia, infection, trauma to the teeth and death.    Procedural time out performed. The oropharynx was anesthetized with topical 1% benzocaine.    Anesthesia was administered by Dr. Rex Kras.  The patient was administered 500 mg of propofol and 60 mg of lidocaine to achieve and maintain moderate conscious sedation.  The patient's heart rate, blood pressure, and oxygen saturation are monitored continuously during the procedure. The period of conscious sedation is 22 minutes, of which I was present face-to-face 100% of this time.   The transesophageal probe was inserted in the esophagus and stomach without difficulty and multiple views were obtained.   COMPLICATIONS:    There were no immediate complications.  KEY FINDINGS:  Severe AI.  LVEF 45-50%. Negative bubble. .  Full report to follow. Further management per primary team.   [place] Bells T. Audie Box, MD, [place]  [address] [place], [place] [address] [phone]  8:48 AM

## 2022-05-12 NOTE — Transfer of Care (Signed)
Immediate Anesthesia Transfer of Care Note  Patient: Rachael Stone  Procedure(s) Performed: TRANSESOPHAGEAL ECHOCARDIOGRAM (TEE) BUBBLE STUDY  Patient Location: PACU  Anesthesia Type:MAC  Level of Consciousness: drowsy and patient cooperative  Airway & Oxygen Therapy: Patient Spontanous Breathing and Patient connected to nasal cannula oxygen  Post-op Assessment: Report given to RN, Post -op Vital signs reviewed and stable and Patient moving all extremities X 4  Post vital signs: Reviewed and stable  Last Vitals:  Vitals Value Taken Time  BP    Temp    Pulse    Resp    SpO2      Last Pain:  Vitals:   05/12/22 0736  TempSrc: Oral  PainSc: 0-No pain         Complications: No notable events documented.

## 2022-05-12 NOTE — Anesthesia Postprocedure Evaluation (Signed)
Anesthesia Post Note  Patient: Rachael Stone  Procedure(s) Performed: TRANSESOPHAGEAL ECHOCARDIOGRAM (TEE) BUBBLE STUDY     Patient location during evaluation: Endoscopy Anesthesia Type: MAC Level of consciousness: awake and alert Pain management: pain level controlled Vital Signs Assessment: post-procedure vital signs reviewed and stable Respiratory status: spontaneous breathing, nonlabored ventilation, respiratory function stable and patient connected to nasal cannula oxygen Cardiovascular status: stable and blood pressure returned to baseline Postop Assessment: no apparent nausea or vomiting Anesthetic complications: no   No notable events documented.  Last Vitals:  Vitals:   05/12/22 1200 05/12/22 1205  BP:    Pulse: 79 76  Resp:    Temp:    SpO2: 100% 100%    Last Pain:  Vitals:   05/12/22 0944  TempSrc:   PainSc: 0-No pain                 Belenda Cruise P Reeta Kuk

## 2022-05-12 NOTE — Progress Notes (Signed)
  Echocardiogram Echocardiogram Transesophageal has been performed.  Eartha Inch 05/12/2022, 9:13 AM

## 2022-05-12 NOTE — Interval H&P Note (Signed)
History and Physical Interval Note:  05/12/2022 9:23 AM  Rachael Stone  has presented today for surgery, with the diagnosis of aortic stenosis.  The various methods of treatment have been discussed with the patient and family. After consideration of risks, benefits and other options for treatment, the patient has consented to  Procedure(s): RIGHT/LEFT HEART CATH AND CORONARY ANGIOGRAPHY (N/A) as a surgical intervention.  The patient's history has been reviewed, patient examined, no change in status, stable for surgery.  I have reviewed the patient's chart and labs.  Questions were answered to the patient's satisfaction.     [name]

## 2022-05-14 ENCOUNTER — Encounter (HOSPITAL_COMMUNITY): Payer: Self-pay | Admitting: Cardiovascular Disease

## 2022-05-29 NOTE — H&P (View-Only) (Signed)
[address]       [place] [address]             [phone]        Vieno A Brunkhorst [place] Medical Record #[id number] Date of Birth: 1966/07/04  Referring: [name], * Primary Care: [name], MD Primary Cardiologist:[name], MD  Chief Complaint:    Chief Complaint  Patient presents with   Aortic Insuffiency    Surgical consult, TEE and Cardiac Cath 05/12/22    History of Present Illness:     56 year old female presents for surgical evaluation of severe aortic valve insufficiency.  She was also previously seen for a left upper lobe biopsy-proven non-small cell lung cancer.  She continues to smoke and admits to exertional shortness of breath.  She remains very hesitant to proceed with any surgical procedures.   Past Medical and Surgical History: Previous Chest Surgery: No Previous Chest Radiation: No Diabetes Mellitus: no.  HbA1C 5.3 Creatinine: 0.72  Past Medical History:  Diagnosis Date   Arthritis    Back pain    Cardiomyopathy ([place])    a. 04/08/2016 Echo: EF 35%, diff HK, mild LVH, Gr2 DD, mod AI, mod to sev MR, mildly dil LA.   DEPRESSIVE DISORDER NOT ELSEWHERE CLASSIFIED 12/14/2008   no current problem per patient on 04/14/22.  Qualifier: Diagnosis of  By: [name], [name]   DYSFUNCTIONAL UTERINE BLEEDING 11/12/2007   Qualifier: Diagnosis of  By: [name], [name]     Dyspnea    ETOH abuse    Hypertension    Hypertensive heart disease with heart failure ([place])    Marijuana abuse    Last use 04/14/22   MENORRHAGIA 12/14/2008   Qualifier: Diagnosis of  By: [name], [name]     Moderate aortic insufficiency    a. 04/08/2016 Echo: mod AI.   Moderate to Severe Mitral Regurgitation    a. 04/08/2016 Echo: mod-sev MR directed centrally.   Tobacco abuse     Past Surgical History:  Procedure Laterality Date   BRONCHIAL BIOPSY  04/15/2022   Procedure: BRONCHIAL BIOPSIES;  Surgeon: [name], DO;  Location: [facility];  Service: Pulmonary;;   BRONCHIAL NEEDLE ASPIRATION BIOPSY  04/15/2022   Procedure: BRONCHIAL NEEDLE ASPIRATION BIOPSIES;  Surgeon: [name], DO;  Location: [facility];  Service: Pulmonary;;   BUBBLE STUDY  05/12/2022   Procedure: BUBBLE STUDY;  Surgeon: [name], MD;  Location: [facility];  Service: Cardiovascular;;   CARDIAC CATHETERIZATION N/A 04/10/2016   Procedure: Right/Left Heart Cath and Coronary Angiography;  Surgeon: [name], MD;  Location: [facility];  Service: Cardiovascular;  Laterality: N/A;   EXPLORATORY LAPAROTOMY  2003   Ectopic Pregnancy - unsure which side or if tube was removed   RIGHT/LEFT HEART CATH AND CORONARY ANGIOGRAPHY N/A 05/12/2022   Procedure: RIGHT/LEFT HEART CATH AND CORONARY ANGIOGRAPHY;  Surgeon: [name], MD;  Location: [facility];  Service: Cardiovascular;  Laterality: N/A;   TEE WITHOUT CARDIOVERSION N/A 06/26/2017   Procedure: TRANSESOPHAGEAL ECHOCARDIOGRAM (TEE);  Surgeon: [name], MD;  Location: [facility] ENDOSCOPY;  Service: Cardiovascular;  Laterality: N/A;   TEE WITHOUT CARDIOVERSION N/A 05/12/2022   Procedure: TRANSESOPHAGEAL ECHOCARDIOGRAM (TEE);  Surgeon: [name], MD;  Location: [facility];  Service: Cardiovascular;  Laterality: N/A;   TUBAL LIGATION      Social History: Support: Homeless.  Comes today with her 2 sisters.  Social  History   Tobacco Use  Smoking Status Every Day   Packs/day: 1.00   Years: 34.00   Total pack years: 34.00   Types: Cigarettes  Smokeless Tobacco Never  Tobacco Comments   4 packs a cigarettes a week. 03/31/2022 [facility].    Social History   Substance and Sexual Activity  Alcohol Use Yes   Alcohol/week: 4.0 standard drinks of alcohol   Types: 4 Standard drinks or equivalent per week   Comment: At least 12 ounces of homemade moon shine every other day.     Allergies  Allergen Reactions   Ibuprofen Other (See Comments)     Irritates stomach      Current Outpatient Medications  Medication Sig Dispense Refill   acetaminophen (TYLENOL) 500 MG tablet Take 500 mg by mouth every 8 (eight) hours as needed for moderate pain.     amLODipine-olmesartan (AZOR) 5-20 MG tablet Take 1 tablet by mouth daily.     carvedilol (COREG) 3.125 MG tablet Take 1 tablet (3.125 mg total) by mouth 2 (two) times daily. (Patient taking differently: Take 3.125 mg by mouth daily.) 180 tablet 3   cyanocobalamin (VITAMIN B12) 1000 MCG tablet Take 1,000 mcg by mouth daily.     furosemide (LASIX) 20 MG tablet Take 1 tablet (20 mg total) by mouth daily. 90 tablet 3   losartan (COZAAR) 25 MG tablet Take 1 tablet by mouth once daily 30 tablet 7   Multiple Vitamins-Minerals (WOMENS MULTI PO) Take 1 tablet by mouth daily.     potassium chloride (KLOR-CON) 10 MEQ tablet Take 1 tablet (10 mEq total) by mouth daily. 90 tablet 3   gabapentin (NEURONTIN) 100 MG capsule Take 100 mg by mouth 3 (three) times daily as needed (pain).     Menthol, Topical Analgesic, (ICY HOT EX) Apply 1 Application topically daily as needed (pain).     No current facility-administered medications for this visit.    (Not in a hospital admission)   Family History  Problem Relation Age of Onset   CAD Mother        First MI @ 82 - 61 total.   Cirrhosis Father        alcoholic - died in his 41'D.   Lupus Sister    Colon cancer Neg Hx    Colon polyps Neg Hx      Review of Systems:   Review of Systems  Constitutional: Negative.   Respiratory:  Positive for shortness of breath.   Cardiovascular:  Negative for chest pain.  Neurological: Negative.       Physical Exam: BP (!) 165/79 (BP Location: Right Arm, Patient Position: Sitting)   Pulse 96   Resp 20   Ht 5\' 5"  (1.651 m)   Wt 132 lb (59.9 kg)   LMP  (LMP Unknown)   SpO2 98% Comment: RA  BMI 21.97 kg/m  Physical Exam Constitutional:      General: She is not in acute distress.    Appearance: She is  normal weight. She is not ill-appearing.  Eyes:     Extraocular Movements: Extraocular movements intact.     Pupils: Pupils are equal, round, and reactive to light.  Cardiovascular:     Rate and Rhythm: Normal rate.     Pulses: Normal pulses.     Heart sounds: Murmur heard.  Pulmonary:     Effort: Pulmonary effort is normal. No respiratory distress.  Abdominal:     General: Abdomen is flat. There is no distension.  Musculoskeletal:        General: Normal range of motion.     Cervical back: Normal range of motion.  Skin:    General: Skin is warm and dry.  Neurological:     General: No focal deficit present.     Mental Status: She is alert and oriented to person, place, and time.       Diagnostic Studies & Laboratory data:    Left Heart Catherization:  Intervention  Echo: IMPRESSIONS     1. Tricuspid aortic valve with severe AI. Leaflet tips are thickened and  retracted with incomplete leaflet coaptation creating a central  regurgitant jet. Tachycardia was present which can affect quantitation. 2D  ERO by PISA 0.46 cm2, R vol 61 cc. 2D VC  0.52. Holodiastolic flow reversal is present in the descending aorta. LVEF  reduced 40-45%. Mildly dilated LV cavity. All findings consistent with  severe AI. The aortic valve is tricuspid. There is mild thickening of the  aortic valve. Aortic valve  regurgitation is severe. No aortic stenosis is present.   2. Left ventricular ejection fraction, by estimation, is 40 to 45%. The  left ventricle has mildly decreased function. The left ventricle  demonstrates global hypokinesis. The left ventricular internal cavity size  was mildly dilated.   3. Right ventricular systolic function is normal. The right ventricular  size is normal. Tricuspid regurgitation signal is inadequate for assessing  PA pressure.   4. No left atrial/left atrial appendage thrombus was detected.   5. The mitral valve is grossly normal. Mild mitral valve  regurgitation.  No evidence of mitral stenosis.   6. Agitated saline contrast bubble study was negative, with no evidence  of any interatrial shunt.  EKG: sinus I have independently reviewed the above radiologic studies and discussed with the patient   Recent Lab Findings: Lab Results  Component Value Date   WBC 5.7 05/05/2022   HGB 12.9 05/12/2022   HCT 38.0 05/12/2022   PLT 215 05/05/2022   GLUCOSE 82 05/05/2022   CHOL 162 04/25/2019   TRIG 133 04/25/2019   HDL 52 04/25/2019   LDLCALC 87 04/25/2019   ALT 28 04/15/2022   AST 48 (H) 04/15/2022   NA 142 05/12/2022   K 3.2 (L) 05/12/2022   CL 104 05/05/2022   CREATININE 0.72 05/05/2022   BUN 7 05/05/2022   CO2 26 05/05/2022   TSH 0.211 (L) 05/13/2021   INR 0.9 12/24/2018   HGBA1C 5.3 01/10/2019   IMPRESSION: 1. Lobulated lingular nodule measuring 1.5 x 1.1 cm, new from 2021 exam. This nodule abuts the left heart border and is suspicious for primary bronchogenic malignancy. Recommend PET-CT characterization. 2. Bilobed nodule versus 2 adjacent nodules in the left upper lobe posterior apex measuring up to 13 x 6 mm, unchanged in size from prior exam, and only minimally increased in size from 2017 exam. Favor benign process. 3. Sub solid 3 mm right upper lobe nodule, nonspecific. 4. Moderate emphysema. 5. Coronary artery calcifications. 6. Hepatic steatosis.   Assessment / Plan:   56 year old female with severe aortic valve regurgitation with evidence of heart failure, and biopsy-proven left upper lobe non-small cell lung cancer.  She continues to smoke however given the degree of her aortic valve insufficiency her lung cancer will be secondary to this.  Recommended that she undergo an aortic valve replacement.  She would like to have a bioprosthetic valve and not be on Coumadin.  She will require dental clearance.  She does not  want to do anything prior to Thanksgiving in regards to her lung cancer given that she will  require open heart surgery will be prudent for her to be treated with SBRT so that we do not delay her care any further.  She was originally diagnosed in August 2023.  She recently had a biopsy last month but has not made any significant progress in regards to smoking cessation.     I  spent 40 minutes counseling the patient face to face.   [name] 05/30/2022 3:28 PM

## 2022-05-29 NOTE — Progress Notes (Signed)
[address]       [place] [address]             [phone]        Dashay A Netzley [place] Medical Record #[id number] Date of Birth: [birth year]  Referring: [name], * Primary Care: [name], MD Primary Cardiologist:[name], MD  Chief Complaint:    Chief Complaint  Patient presents with   Aortic Insuffiency    Surgical consult, TEE and Cardiac Cath 05/12/22    History of Present Illness:     56 year old female presents for surgical evaluation of severe aortic valve insufficiency.  She was also previously seen for a left upper lobe biopsy-proven non-small cell lung cancer.  She continues to smoke and admits to exertional shortness of breath.  She remains very hesitant to proceed with any surgical procedures.   Past Medical and Surgical History: Previous Chest Surgery: No Previous Chest Radiation: No Diabetes Mellitus: no.  HbA1C 5.3 Creatinine: 0.72  Past Medical History:  Diagnosis Date   Arthritis    Back pain    Cardiomyopathy ([place])    a. 04/08/2016 Echo: EF 35%, diff HK, mild LVH, Gr2 DD, mod AI, mod to sev MR, mildly dil LA.   DEPRESSIVE DISORDER NOT ELSEWHERE CLASSIFIED 12/14/2008   no current problem per patient on 04/14/22.  Qualifier: Diagnosis of  By: [name], [name]   DYSFUNCTIONAL UTERINE BLEEDING 11/12/2007   Qualifier: Diagnosis of  By: [name], [name]     Dyspnea    ETOH abuse    Hypertension    Hypertensive heart disease with heart failure ([place])    Marijuana abuse    Last use 04/14/22   MENORRHAGIA 12/14/2008   Qualifier: Diagnosis of  By: [name], [name]     Moderate aortic insufficiency    a. 04/08/2016 Echo: mod AI.   Moderate to Severe Mitral Regurgitation    a. 04/08/2016 Echo: mod-sev MR directed centrally.   Tobacco abuse     Past Surgical History:  Procedure Laterality Date   BRONCHIAL BIOPSY  04/15/2022   Procedure: BRONCHIAL BIOPSIES;  Surgeon: [name], DO;  Location: [facility]  ENDOSCOPY;  Service: Pulmonary;;   BRONCHIAL NEEDLE ASPIRATION BIOPSY  04/15/2022   Procedure: BRONCHIAL NEEDLE ASPIRATION BIOPSIES;  Surgeon: [name], DO;  Location: [facility];  Service: Pulmonary;;   BUBBLE STUDY  05/12/2022   Procedure: BUBBLE STUDY;  Surgeon: [name], MD;  Location: [facility];  Service: Cardiovascular;;   CARDIAC CATHETERIZATION N/A 04/10/2016   Procedure: Right/Left Heart Cath and Coronary Angiography;  Surgeon: [name], MD;  Location: [facility];  Service: Cardiovascular;  Laterality: N/A;   EXPLORATORY LAPAROTOMY  2003   Ectopic Pregnancy - unsure which side or if tube was removed   RIGHT/LEFT HEART CATH AND CORONARY ANGIOGRAPHY N/A 05/12/2022   Procedure: RIGHT/LEFT HEART CATH AND CORONARY ANGIOGRAPHY;  Surgeon: [name], MD;  Location: [facility];  Service: Cardiovascular;  Laterality: N/A;   TEE WITHOUT CARDIOVERSION N/A 06/26/2017   Procedure: TRANSESOPHAGEAL ECHOCARDIOGRAM (TEE);  Surgeon: [name], MD;  Location: [facility] ENDOSCOPY;  Service: Cardiovascular;  Laterality: N/A;   TEE WITHOUT CARDIOVERSION N/A 05/12/2022   Procedure: TRANSESOPHAGEAL ECHOCARDIOGRAM (TEE);  Surgeon: [name], MD;  Location: [facility];  Service: Cardiovascular;  Laterality: N/A;   TUBAL LIGATION      Social History: Support: Homeless.  Comes today with her 2 sisters.  Social  History   Tobacco Use  Smoking Status Every Day   Packs/day: 1.00   Years: 34.00   Total pack years: 34.00   Types: Cigarettes  Smokeless Tobacco Never  Tobacco Comments   4 packs a cigarettes a week. 03/31/2022 Tay.    Social History   Substance and Sexual Activity  Alcohol Use Yes   Alcohol/week: 4.0 standard drinks of alcohol   Types: 4 Standard drinks or equivalent per week   Comment: At least 12 ounces of homemade moon shine every other day.     Allergies  Allergen Reactions   Ibuprofen Other (See Comments)     Irritates stomach      Current Outpatient Medications  Medication Sig Dispense Refill   acetaminophen (TYLENOL) 500 MG tablet Take 500 mg by mouth every 8 (eight) hours as needed for moderate pain.     amLODipine-olmesartan (AZOR) 5-20 MG tablet Take 1 tablet by mouth daily.     carvedilol (COREG) 3.125 MG tablet Take 1 tablet (3.125 mg total) by mouth 2 (two) times daily. (Patient taking differently: Take 3.125 mg by mouth daily.) 180 tablet 3   cyanocobalamin (VITAMIN B12) 1000 MCG tablet Take 1,000 mcg by mouth daily.     furosemide (LASIX) 20 MG tablet Take 1 tablet (20 mg total) by mouth daily. 90 tablet 3   losartan (COZAAR) 25 MG tablet Take 1 tablet by mouth once daily 30 tablet 7   Multiple Vitamins-Minerals (WOMENS MULTI PO) Take 1 tablet by mouth daily.     potassium chloride (KLOR-CON) 10 MEQ tablet Take 1 tablet (10 mEq total) by mouth daily. 90 tablet 3   gabapentin (NEURONTIN) 100 MG capsule Take 100 mg by mouth 3 (three) times daily as needed (pain).     Menthol, Topical Analgesic, (ICY HOT EX) Apply 1 Application topically daily as needed (pain).     No current facility-administered medications for this visit.    (Not in a hospital admission)   Family History  Problem Relation Age of Onset   CAD Mother        First MI @ 78 - 75 total.   Cirrhosis Father        alcoholic - died in his 30'Q.   Lupus Sister    Colon cancer Neg Hx    Colon polyps Neg Hx      Review of Systems:   Review of Systems  Constitutional: Negative.   Respiratory:  Positive for shortness of breath.   Cardiovascular:  Negative for chest pain.  Neurological: Negative.       Physical Exam: BP (!) 165/79 (BP Location: Right Arm, Patient Position: Sitting)   Pulse 96   Resp 20   Ht 5\' 5"  (1.651 m)   Wt 132 lb (59.9 kg)   LMP  (LMP Unknown)   SpO2 98% Comment: RA  BMI 21.97 kg/m  Physical Exam Constitutional:      General: She is not in acute distress.    Appearance: She is  normal weight. She is not ill-appearing.  Eyes:     Extraocular Movements: Extraocular movements intact.     Pupils: Pupils are equal, round, and reactive to light.  Cardiovascular:     Rate and Rhythm: Normal rate.     Pulses: Normal pulses.     Heart sounds: Murmur heard.  Pulmonary:     Effort: Pulmonary effort is normal. No respiratory distress.  Abdominal:     General: Abdomen is flat. There is no distension.  Musculoskeletal:        General: Normal range of motion.     Cervical back: Normal range of motion.  Skin:    General: Skin is warm and dry.  Neurological:     General: No focal deficit present.     Mental Status: She is alert and oriented to person, place, and time.       Diagnostic Studies & Laboratory data:    Left Heart Catherization:  Intervention  Echo: IMPRESSIONS     1. Tricuspid aortic valve with severe AI. Leaflet tips are thickened and  retracted with incomplete leaflet coaptation creating a central  regurgitant jet. Tachycardia was present which can affect quantitation. 2D  ERO by PISA 0.46 cm2, R vol 61 cc. 2D VC  0.52. Holodiastolic flow reversal is present in the descending aorta. LVEF  reduced 40-45%. Mildly dilated LV cavity. All findings consistent with  severe AI. The aortic valve is tricuspid. There is mild thickening of the  aortic valve. Aortic valve  regurgitation is severe. No aortic stenosis is present.   2. Left ventricular ejection fraction, by estimation, is 40 to 45%. The  left ventricle has mildly decreased function. The left ventricle  demonstrates global hypokinesis. The left ventricular internal cavity size  was mildly dilated.   3. Right ventricular systolic function is normal. The right ventricular  size is normal. Tricuspid regurgitation signal is inadequate for assessing  PA pressure.   4. No left atrial/left atrial appendage thrombus was detected.   5. The mitral valve is grossly normal. Mild mitral valve  regurgitation.  No evidence of mitral stenosis.   6. Agitated saline contrast bubble study was negative, with no evidence  of any interatrial shunt.  EKG: sinus I have independently reviewed the above radiologic studies and discussed with the patient   Recent Lab Findings: Lab Results  Component Value Date   WBC 5.7 05/05/2022   HGB 12.9 05/12/2022   HCT 38.0 05/12/2022   PLT 215 05/05/2022   GLUCOSE 82 05/05/2022   CHOL 162 04/25/2019   TRIG 133 04/25/2019   HDL 52 04/25/2019   LDLCALC 87 04/25/2019   ALT 28 04/15/2022   AST 48 (H) 04/15/2022   NA 142 05/12/2022   K 3.2 (L) 05/12/2022   CL 104 05/05/2022   CREATININE 0.72 05/05/2022   BUN 7 05/05/2022   CO2 26 05/05/2022   TSH 0.211 (L) 05/13/2021   INR 0.9 12/24/2018   HGBA1C 5.3 01/10/2019   IMPRESSION: 1. Lobulated lingular nodule measuring 1.5 x 1.1 cm, new from 2021 exam. This nodule abuts the left heart border and is suspicious for primary bronchogenic malignancy. Recommend PET-CT characterization. 2. Bilobed nodule versus 2 adjacent nodules in the left upper lobe posterior apex measuring up to 13 x 6 mm, unchanged in size from prior exam, and only minimally increased in size from 2017 exam. Favor benign process. 3. Sub solid 3 mm right upper lobe nodule, nonspecific. 4. Moderate emphysema. 5. Coronary artery calcifications. 6. Hepatic steatosis.   Assessment / Plan:   56 year old female with severe aortic valve regurgitation with evidence of heart failure, and biopsy-proven left upper lobe non-small cell lung cancer.  She continues to smoke however given the degree of her aortic valve insufficiency her lung cancer will be secondary to this.  Recommended that she undergo an aortic valve replacement.  She would like to have a bioprosthetic valve and not be on Coumadin.  She will require dental clearance.  She does not  want to do anything prior to Thanksgiving in regards to her lung cancer given that she will  require open heart surgery will be prudent for her to be treated with SBRT so that we do not delay her care any further.  She was originally diagnosed in August 2023.  She recently had a biopsy last month but has not made any significant progress in regards to smoking cessation.     I  spent 40 minutes counseling the patient face to face.   [name] 05/30/2022 3:28 PM

## 2022-05-30 ENCOUNTER — Other Ambulatory Visit: Payer: Self-pay | Admitting: *Deleted

## 2022-05-30 ENCOUNTER — Institutional Professional Consult (permissible substitution) (INDEPENDENT_AMBULATORY_CARE_PROVIDER_SITE_OTHER): Payer: Commercial Managed Care - HMO | Admitting: Thoracic Surgery (Cardiothoracic Vascular Surgery)

## 2022-05-30 ENCOUNTER — Encounter: Payer: Self-pay | Admitting: *Deleted

## 2022-05-30 ENCOUNTER — Ambulatory Visit: Payer: Commercial Managed Care - HMO | Admitting: Acute Care

## 2022-05-30 VITALS — BP 165/79 | HR 96 | Resp 20 | Ht 65.0 in | Wt 132.0 lb

## 2022-05-30 DIAGNOSIS — I351 Nonrheumatic aortic (valve) insufficiency: Secondary | ICD-10-CM

## 2022-06-03 ENCOUNTER — Telehealth: Payer: Self-pay | Admitting: Radiation Oncology

## 2022-06-03 ENCOUNTER — Other Ambulatory Visit: Payer: Self-pay | Admitting: *Deleted

## 2022-06-03 NOTE — Pre-Procedure Instructions (Signed)
Surgical Instructions    Your procedure is scheduled on Tuesday, November 28th.  Report to [facility] Main Entrance "A" at 05:30 A.M., then check in with the Admitting office.  Call this number if you have problems the morning of surgery:  [phone]   If you have any questions prior to your surgery date call [phone]: Open Monday-Friday 8am-4pm    Remember:  Do not eat or drink after midnight the night before your surgery      Take these medicines the morning of surgery with A SIP OF WATER  carvedilol (COREG)   If needed: acetaminophen (TYLENOL)  gabapentin (NEURONTIN)     As of today, STOP taking any Aspirin (unless otherwise instructed by your surgeon) Aleve, Naproxen, Ibuprofen, Motrin, Advil, Goody's, BC's, all herbal medications, fish oil, and all vitamins.                     Do NOT Smoke (Tobacco/Vaping) for 24 hours prior to your procedure.  If you use a CPAP at night, you may bring your mask/headgear for your overnight stay.   Contacts, glasses, piercing's, hearing aid's, dentures or partials may not be worn into surgery, please bring cases for these belongings.    For patients admitted to the hospital, discharge time will be determined by your treatment team.   Patients discharged the day of surgery will not be allowed to drive home, and someone needs to stay with them for 24 hours.  SURGICAL WAITING ROOM VISITATION Patients having surgery or a procedure may have no more than 2 support people in the waiting area - these visitors may rotate.   Children under the age of 16 must have an adult with them who is not the patient. If the patient needs to stay at the hospital during part of their recovery, the visitor guidelines for inpatient rooms apply. Pre-op nurse will coordinate an appropriate time for 1 support person to accompany patient in pre-op.  This support person may not rotate.   Please refer to the [facility] website for the visitor guidelines for  Inpatients (after your surgery is over and you are in a regular room).    Special instructions:   [place]- Preparing For Surgery  Before surgery, you can play an important role. Because skin is not sterile, your skin needs to be as free of germs as possible. You can reduce the number of germs on your skin by washing with CHG (chlorahexidine gluconate) Soap before surgery.  CHG is an antiseptic cleaner which kills germs and bonds with the skin to continue killing germs even after washing.    Oral Hygiene is also important to reduce your risk of infection.  Remember - BRUSH YOUR TEETH THE MORNING OF SURGERY WITH YOUR REGULAR TOOTHPASTE  Please do not use if you have an allergy to CHG or antibacterial soaps. If your skin becomes reddened/irritated stop using the CHG.  Do not shave (including legs and underarms) for at least 48 hours prior to first CHG shower. It is OK to shave your face.  Please follow these instructions carefully.   Shower the NIGHT BEFORE SURGERY and the MORNING OF SURGERY  If you chose to wash your hair, wash your hair first as usual with your normal shampoo.  After you shampoo, rinse your hair and body thoroughly to remove the shampoo.  Use CHG Soap as you would any other liquid soap. You can apply CHG directly to the skin and wash gently with a scrungie  or a clean washcloth.   Apply the CHG Soap to your body ONLY FROM THE NECK DOWN.  Do not use on open wounds or open sores. Avoid contact with your eyes, ears, mouth and genitals (private parts). Wash Face and genitals (private parts)  with your normal soap.   Wash thoroughly, paying special attention to the area where your surgery will be performed.  Thoroughly rinse your body with warm water from the neck down.  DO NOT shower/wash with your normal soap after using and rinsing off the CHG Soap.  Pat yourself dry with a CLEAN TOWEL.  Wear CLEAN PAJAMAS to bed the night before surgery  Place CLEAN SHEETS on your  bed the night before your surgery  DO NOT SLEEP WITH PETS.   Day of Surgery: Take a shower with CHG soap. Do not wear jewelry or makeup Do not wear lotions, powders, perfumes, or deodorant. Do not shave 48 hours prior to surgery.   Do not bring valuables to the hospital. [facility] is not responsible for any belongings or valuables. Do not wear nail polish, gel polish, artificial nails, or any other type of covering on natural nails (fingers and toes) If you have artificial nails or gel coating that need to be removed by a nail salon, please have this removed prior to surgery. Artificial nails or gel coating may interfere with anesthesia's ability to adequately monitor your vital signs. Wear Clean/Comfortable clothing the morning of surgery Remember to brush your teeth WITH YOUR REGULAR TOOTHPASTE.   Please read over the following fact sheets that you were given.    If you received a COVID test during your pre-op visit  it is requested that you wear a mask when out in public, stay away from anyone that may not be feeling well and notify your surgeon if you develop symptoms. If you have been in contact with anyone that has tested positive in the last 10 days please notify you surgeon.

## 2022-06-03 NOTE — Telephone Encounter (Signed)
Unable to leave message on patient's phones. Left message on sister's phone for patient to call back to schedule appointment per 11/20 referral.

## 2022-06-03 NOTE — Telephone Encounter (Signed)
Pt returned call and stated she is not wanting to schedule consultation at this time. Pt wants to focus on heart surgery and will have Dr. [name] resend referral after surgery and recovery. I offered consultation to meet provider to establish care to help expedite treatment after surgery but pt states she wants to focus on one issue at this time. Referral will be closed per pt's request.

## 2022-06-04 ENCOUNTER — Ambulatory Visit (HOSPITAL_COMMUNITY): Payer: Commercial Managed Care - HMO

## 2022-06-04 ENCOUNTER — Inpatient Hospital Stay (HOSPITAL_COMMUNITY): Admission: RE | Admit: 2022-06-04 | Payer: Commercial Managed Care - HMO | Source: Ambulatory Visit

## 2022-06-09 ENCOUNTER — Ambulatory Visit (INDEPENDENT_AMBULATORY_CARE_PROVIDER_SITE_OTHER): Payer: Commercial Managed Care - HMO | Admitting: Dentistry

## 2022-06-09 ENCOUNTER — Encounter (HOSPITAL_COMMUNITY): Payer: Self-pay | Admitting: Dentistry

## 2022-06-09 ENCOUNTER — Telehealth: Payer: Self-pay | Admitting: Cardiovascular Disease

## 2022-06-09 VITALS — BP 152/74 | HR 87 | Temp 98.3°F

## 2022-06-09 DIAGNOSIS — F40232 Fear of other medical care: Secondary | ICD-10-CM

## 2022-06-09 DIAGNOSIS — K06020 Generalized gingival recession, unspecified: Secondary | ICD-10-CM

## 2022-06-09 DIAGNOSIS — K0850 Unsatisfactory restoration of tooth, unspecified: Secondary | ICD-10-CM

## 2022-06-09 DIAGNOSIS — Z01818 Encounter for other preprocedural examination: Secondary | ICD-10-CM

## 2022-06-09 DIAGNOSIS — I351 Nonrheumatic aortic (valve) insufficiency: Secondary | ICD-10-CM

## 2022-06-09 DIAGNOSIS — K029 Dental caries, unspecified: Secondary | ICD-10-CM

## 2022-06-09 DIAGNOSIS — K036 Deposits [accretions] on teeth: Secondary | ICD-10-CM

## 2022-06-09 DIAGNOSIS — K0530 Chronic periodontitis, unspecified: Secondary | ICD-10-CM

## 2022-06-09 DIAGNOSIS — K08109 Complete loss of teeth, unspecified cause, unspecified class: Secondary | ICD-10-CM

## 2022-06-09 DIAGNOSIS — K030 Excessive attrition of teeth: Secondary | ICD-10-CM

## 2022-06-09 NOTE — Progress Notes (Signed)
[name]     OUTPATIENT CONSULTATION   PLAN/RECOMMENDATIONS     ASSESSMENT: There are no current signs of acute odontogenic infection including abscess, edema or erythema, or suspicious lesion requiring biopsy.   Caries, defective restorations; periodontal concerns that include accretions on teeth & bone loss.     RECOMMENDATIONS: Extractions of all indicated teeth to decrease the risk of perioperative and postoperative systemic infection and complications.     PLAN: Discuss case with medical team and coordinate treatment as needed.   Plan to schedule the patient for the operating room for all indicated dental treatment under general anesthesia, pending medical team's recommendations. Call if any questions or concerns arise.  Discussed in detail all treatment options and recommendations with the patient and they are agreeable to the plan.   Thank you for consulting with Hospital Dentistry and for the opportunity to participate in this patient's treatment.  Should you have any questions or concerns, please contact the [facility] at [phone].    Service Date:   06/09/2022  Patient Name:   Rachael Stone Date of Birth:   [birth year] Medical Record Number: [id number]  Referring Provider:          Melodie Bouillon, MD   HISTORY OF PRESENT ILLNESS: Rachael Stone is a very pleasant 56 y.o. female with history of HTN, arthritis, tobacco use, GERD, congestive heart failure, mitral regurgitation and severe aortic insufficiency who is anticipating aortic valve replacement (AVR).   The patient presents today for a medically necessary dental consultation as part of their pre-cardiac surgery work-up.   DENTAL HISTORY: The patient reports that she does not have a dentist that she sees regularly.  She currently denies any dental/orofacial pain or sensitivity. Patient is able to manage oral secretions.  Patient denies  dysphagia, odynophagia, dysphonia, SOB and neck pain.  Patient denies fever, rigors and malaise.   CHIEF COMPLAINT:  Here for a preoperative dental evaluation.   Patient Active Problem List   Diagnosis Date Noted   Nodule of upper lobe of left lung 03/31/2022   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/16/2021   Sinus tachycardia 05/16/2021   Alcoholism ([place])    Hypokalemia    Hospital discharge follow-up    BV (bacterial vaginosis) 01/26/2017   Trichomonal infection 01/26/2017   Essential hypertension 11/14/2016   Normal coronary arteries 04/11/2016   Hypertensive heart disease with heart failure (HCC)    NICM (nonischemic cardiomyopathy) ([place])    Nonrheumatic aortic valve insufficiency    Moderate to Severe Mitral Regurgitation    Tobacco abuse    Acute pulmonary edema ([place]) 04/08/2016   Elevated blood pressure 04/08/2016   Dyspnea    VAGINAL DISCHARGE 09/10/2009   CHEST PAIN UNSPECIFIED 08/13/2009   DEPRESSIVE DISORDER NOT ELSEWHERE CLASSIFIED 12/14/2008   MENORRHAGIA 12/14/2008   NAUSEA, CHRONIC 12/14/2008   ELEVATED BLOOD PRESSURE WITHOUT DIAGNOSIS OF HYPERTENSION 12/14/2008   DYSFUNCTIONAL UTERINE BLEEDING 11/12/2007   TOBACCO DEPENDENCE 09/10/2006   GASTROESOPHAGEAL REFLUX, NO ESOPHAGITIS 09/10/2006   Past Medical History:  Diagnosis Date   Arthritis    Back pain    Cardiomyopathy ([place])    a. 04/08/2016 Echo: EF 35%, diff HK, mild LVH, Gr2 DD, mod AI, mod to sev MR, mildly dil LA.   DEPRESSIVE DISORDER NOT ELSEWHERE CLASSIFIED 12/14/2008   no current problem per patient on 04/14/22.  Qualifier: Diagnosis of  By: [name], [place] UTERINE BLEEDING 11/12/2007  Qualifier: Diagnosis of  By: [name], [name]     Dyspnea    ETOH abuse    Hypertension    Hypertensive heart disease with heart failure ([place])    Marijuana abuse    Last use 04/14/22   MENORRHAGIA 12/14/2008   Qualifier: Diagnosis of  By: [name], [name]     Moderate aortic insufficiency     a. 04/08/2016 Echo: mod AI.   Moderate to Severe Mitral Regurgitation    a. 04/08/2016 Echo: mod-sev MR directed centrally.   Tobacco abuse    Past Surgical History:  Procedure Laterality Date   BRONCHIAL BIOPSY  04/15/2022   Procedure: BRONCHIAL BIOPSIES;  Surgeon: [name], DO;  Location: [facility];  Service: Pulmonary;;   BRONCHIAL NEEDLE ASPIRATION BIOPSY  04/15/2022   Procedure: BRONCHIAL NEEDLE ASPIRATION BIOPSIES;  Surgeon: [name], DO;  Location: [place];  Service: Pulmonary;;   BUBBLE STUDY  05/12/2022   Procedure: BUBBLE STUDY;  Surgeon: [name], MD;  Location: [facility];  Service: Cardiovascular;;   CARDIAC CATHETERIZATION N/A 04/10/2016   Procedure: Right/Left Heart Cath and Coronary Angiography;  Surgeon: [name], MD;  Location: [facility];  Service: Cardiovascular;  Laterality: N/A;   EXPLORATORY LAPAROTOMY  2003   Ectopic Pregnancy - unsure which side or if tube was removed   RIGHT/LEFT HEART CATH AND CORONARY ANGIOGRAPHY N/A 05/12/2022   Procedure: RIGHT/LEFT HEART CATH AND CORONARY ANGIOGRAPHY;  Surgeon: [name], MD;  Location: [facility];  Service: Cardiovascular;  Laterality: N/A;   TEE WITHOUT CARDIOVERSION N/A 06/26/2017   Procedure: TRANSESOPHAGEAL ECHOCARDIOGRAM (TEE);  Surgeon: [name], MD;  Location: [facility];  Service: Cardiovascular;  Laterality: N/A;   TEE WITHOUT CARDIOVERSION N/A 05/12/2022   Procedure: TRANSESOPHAGEAL ECHOCARDIOGRAM (TEE);  Surgeon: [name], MD;  Location: [facility];  Service: Cardiovascular;  Laterality: N/A;   TUBAL LIGATION     Allergies  Allergen Reactions   Ibuprofen Other (See Comments)    Irritates stomach   Current Outpatient Medications  Medication Sig Dispense Refill   acetaminophen (TYLENOL) 500 MG tablet Take 500 mg by mouth every 8 (eight) hours as needed for moderate pain.     amLODipine-olmesartan (AZOR) 5-20 MG tablet  Take 1 tablet by mouth daily.     carvedilol (COREG) 3.125 MG tablet Take 1 tablet (3.125 mg total) by mouth 2 (two) times daily. (Patient taking differently: Take 3.125 mg by mouth daily.) 180 tablet 3   cyanocobalamin (VITAMIN B12) 1000 MCG tablet Take 1,000 mcg by mouth daily.     furosemide (LASIX) 20 MG tablet Take 1 tablet (20 mg total) by mouth daily. 90 tablet 3   gabapentin (NEURONTIN) 100 MG capsule Take 100 mg by mouth 3 (three) times daily as needed (pain).     losartan (COZAAR) 25 MG tablet Take 1 tablet by mouth once daily 30 tablet 7   Menthol, Topical Analgesic, (ICY HOT EX) Apply 1 Application topically daily as needed (pain).     Multiple Vitamins-Minerals (WOMENS MULTI PO) Take 1 tablet by mouth daily.     potassium chloride (KLOR-CON) 10 MEQ tablet Take 1 tablet (10 mEq total) by mouth daily. 90 tablet 3   No current facility-administered medications for this visit.    LABS:  Lab Results  Component Value Date   WBC 5.7 05/05/2022   HGB 12.9 05/12/2022   HCT 38.0 05/12/2022   MCV 91 05/05/2022   PLT 215 05/05/2022  Component Value Date/Time   NA 142 05/12/2022 0957   NA 143 05/05/2022 0939   K 3.2 (L) 05/12/2022 0957   CL 104 05/05/2022 0939   CO2 26 05/05/2022 0939   GLUCOSE 82 05/05/2022 0939   GLUCOSE 107 (H) 04/15/2022 0700   BUN 7 05/05/2022 0939   CREATININE 0.72 05/05/2022 0939   CREATININE 1.06 04/25/2016 1418   CALCIUM 9.0 05/05/2022 0939   GFRNONAA >60 04/15/2022 0700   GFRAA >60 03/17/2020 1825   Lab Results  Component Value Date   INR 0.9 12/24/2018   INR 1.01 04/10/2016   No results found for: "PTT"  Social History   Socioeconomic History   Marital status: Single    Spouse name: Not on file   Number of children: Not on file   Years of education: Not on file   Highest education level: Not on file  Occupational History   Occupation: Cook  Tobacco Use   Smoking status: Every Day    Packs/day: 1.00    Years: 34.00    Total  pack years: 34.00    Types: Cigarettes   Smokeless tobacco: Never   Tobacco comments:    4 packs a cigarettes a week. 03/31/2022 [place].  Vaping Use   Vaping Use: Never used  Substance and Sexual Activity   Alcohol use: Yes    Alcohol/week: 4.0 standard drinks of alcohol    Types: 4 Standard drinks or equivalent per week    Comment: At least 12 ounces of homemade moon shine every other day.   Drug use: Yes    Types: Marijuana    Comment: Last use was on 04/14/22.  Smokes marijuana daily.  Used to smoke cocaine laced  marijuana cigarettes for 15-20 yrs but quit cocaine 6 yrs ago.   Sexual activity: Yes    Birth control/protection: Other-see comments    Comment: tubal ligation  Other Topics Concern   Not on file  Social History Narrative   Lives in [place] with her fiance.  Does not routinely exercise.   Social Determinants of Health   Financial Resource Strain: Not on file  Food Insecurity: Not on file  Transportation Needs: Not on file  Physical Activity: Not on file  Stress: Not on file  Social Connections: Not on file  Intimate Partner Violence: Not on file   Family History  Problem Relation Age of Onset   CAD Mother        First MI @ 32 - 53 total.   Cirrhosis Father        alcoholic - died in his 37'J.   Lupus Sister    Colon cancer Neg Hx    Colon polyps Neg Hx      REVIEW OF SYSTEMS:  Reviewed with the patient as per HPI. PSYCH:  [+] Dental phobia   VITAL SIGNS:  BP (!) 152/74 (BP Location: Right Arm, Patient Position: Sitting, Cuff Size: Normal)   Pulse 87   Temp 98.3 F (36.8 C) (Oral)   LMP  (LMP Unknown)    PHYSICAL EXAM: GENERAL:  Well-developed, comfortable and in no apparent distress. NEUROLOGICAL:  Alert and oriented to person, place and  time. EXTRAORAL:  Facial symmetry present without any edema or erythema.  No swelling or lymphadenopathy.  TMJ asymptomatic without clicks or crepitations.  INTRAORAL:  Soft tissues appear well-perfused and mucous  membranes moist.  FOM and vestibules soft and not raised. Oral cavity without mass or lesion. No signs of infection, parulis, sinus tract,  edema or erythema evident upon exam.   DENTAL EXAM: Hard tissue exam completed and charted.    OVERALL IMPRESSION:  Fair-poor remaining dentition.       ORAL HYGIENE:  Poor    PERIODONTAL:  Pink, healthy gingival tissue with blunted papilla with areas of inflamed and erythematous gingival tissue.   Generalized plaque and calculus accumulation. [+] Recession:  Generalized CARIES:  #20, #27, #29, #31 DEFECTIVE RESTORATIONS:  #20, #29 and #31 existing resin restorations with recurrent decay. OCCLUSION:  Unable to assess molar occlusion. Non-functional teeth:  Remaining dentition is non-functional OTHER FINDINGS:   [+] Attrition/wear:  #23-#26 incisal   RADIOGRAPHIC EXAM:  PAN and 6 Periapical images were exposed and interpreted.   Condyles seated bilaterally in fossas.  No evidence of abnormal pathology.  All visualized osseous structures appear WNL.  Missing teeth #'s 1-16, 17-19, 30 & 32.   Generalized mild-moderate horizontal bone loss consistent with mild to moderate periodontitis.   Existing restorations on teeth #'s 20, 21, 28, 29 & 31.  Severe decay approximating the pulp on teeth #27 & #31.  Caries- #20D, #27D, #29D.   ASSESSMENT:  1.  Severe aortic insufficiency 2.  Preoperative dental exam 3.  Missing teeth 4.  Caries 5.  Defective restorations 6.  Accretions on teeth 7.  Chronic periodontitis 8.  Attrition/wear 9.  Dental Phobia 10.  Gingival recession, generalized   PLAN AND RECOMMENDATIONS: I discussed the risks, benefits, and complications of various scenarios with the patient in relationship to their medical and dental conditions, which included systemic infection such as endocarditis, bacteremia or other serious issues that could potentially occur either before, during or after their anticipated heart surgery if  dental/oral concerns are not addressed.  I explained that if any chronic or acute dental/oral infection(s) are addressed and subsequently not maintained following medical optimization and recovery, their risk of the previously mentioned complications are just as high and could potentially occur postoperatively.  I explained all significant findings of the dental consultation with the patient including severe decay affecting teeth #27 and #31, and the recommended care including extractions of these teeth in order to optimize them for heart surgery from a dental standpoint.  The patient verbalized understanding of all findings, discussion, and recommendations. We then discussed various treatment options to include no treatment, multiple extractions with alveoloplasty, pre-prosthetic surgery as indicated, periodontal therapy, dental restorations, root canal therapy, crown and bridge therapy, implant therapy, and replacement of missing teeth as indicated.  The patient verbalized understanding of all options, and currently wishes to proceed with extractions of teeth as indicated in order to optimize her for cardiac surgery.  Plan to discuss all findings and recommendations with medical team and coordinate future care as needed.   Plan to schedule the patient for the operating room for all indicated dental treatment under general anesthesia due to the patient's severe dental phobia and current medical conditions, pending medical team's recommendations.  The patient will need to establish care at a dental office of her choice for routine dental care including replacement of missing teeth as needed, cleanings and exams.  All questions and concerns were invited and addressed.  The patient tolerated today's visit well and departed in stable condition.    I spent in excess of 120 minutes during the conduct of this consultation and >50% of this time involved direct face-to-face encounter for counseling and/or  coordination of the patient's care.  Sandi Mariscal, DMD

## 2022-06-09 NOTE — Telephone Encounter (Signed)
*  STAT* If patient is at the pharmacy, call can be transferred to refill team.   1. Which medications need to be refilled? (please list name of each medication and dose if known)   carvedilol (COREG) 3.125 MG tablet    losartan (COZAAR) 25 MG tablet    2. Which pharmacy/location (including street and city if local pharmacy) is medication to be sent to?  [id number] - [place], [place] [address]     3. Do they need a 30 day or 90 day supply?  90 day

## 2022-06-09 NOTE — Telephone Encounter (Signed)
Pt aware that the pharmacy she wants to switch to will call and get the prescriptions transferred over to them.  She was thankful for the call back.

## 2022-06-09 NOTE — Patient Instructions (Signed)
[facility] Department of Dental Medicine Dr. Debe Coder B. Benson Norway, DMD Phone: [phone] Fax: [phone]     It was a pleasure seeing you today!  Please refer to the information below regarding your dental visit with [place].  Do not hesitate to give [place] a call if any questions or concerns come up after you leave.   Thank you for letting us provide care for you.  If there is anything we can do for you, please let us know.    HEART VALVES AND MOUTH CARE     FACTS: If you have any infection in your mouth, it can infect your heart valve. If you heart valve is infected, you will be seriously ill. Infections in the mouth can be SILENT and do not always cause pain. Examples of infections in the mouth are gum disease, dental cavities and abscesses. Some possible signs of infection are listed below.  There are many other signs as well. Bad breath Bleeding gums Teeth that are sensitive to sweets, hot, and/or cold      WHAT YOU HAVE TO DO: Brush your teeth after meals and at bedtime.  Spend at least 2 minutes brushing well, especially behind your back teeth and all around your teeth that stand alone.  Brush at the gumline also. Do not go to bed without brushing your teeth AND flossing.  If your gums bleed when you brush or floss, do NOT stop brushing or flossing.  Bleeding can be a sign of inflammation or irritation from bacteria.  It usually means that your gums need more attention and better cleaning.  If your dentist or Dr. Benson Norway gave you a prescription mouthwash to use, make sure to use it as directed.  If you run out of the prescription, notify your pharmacy. If you were given any other medications or directions by your dentist, please follow them.  If you did not understand the directions or forget what you were told, please call.  We will be happy to refresh your memory. If you need antibiotics before dental procedures, make sure you take them one hour prior  to every dental visit as directed.  Get a dental check-up every 4-6 months in order to keep your mouth healthy, or to find and treat any new infection. You will most likely need your teeth cleaned or gums treated at the same time. If you are not able to come in for your scheduled appointment, call your dentist as soon as possible to reschedule. If you have a problem in between dental visits, call your dentist.   WE ARE A TEAM.  OUR GOAL IS: HEALTHY MOUTH, HEALTHY HEART.   QUESTIONS?  Call our office during office hours at [phone].

## 2022-06-10 ENCOUNTER — Other Ambulatory Visit: Payer: Self-pay

## 2022-06-10 ENCOUNTER — Encounter (HOSPITAL_COMMUNITY): Payer: Self-pay | Admitting: Dentistry

## 2022-06-10 ENCOUNTER — Inpatient Hospital Stay (HOSPITAL_COMMUNITY)
Admission: RE | Admit: 2022-06-10 | Payer: Commercial Managed Care - HMO | Source: Ambulatory Visit | Admitting: Thoracic Surgery (Cardiothoracic Vascular Surgery)

## 2022-06-10 ENCOUNTER — Ambulatory Visit: Payer: Commercial Managed Care - HMO | Admitting: Acute Care

## 2022-06-10 ENCOUNTER — Encounter (HOSPITAL_COMMUNITY): Admission: RE | Payer: Self-pay | Source: Ambulatory Visit

## 2022-06-10 SURGERY — REPLACEMENT, AORTIC VALVE, OPEN
Anesthesia: General | Site: Chest

## 2022-06-10 NOTE — Progress Notes (Signed)
Spoke with patient. Completed pre-op phone call with patient. Pt denies any new heart concerns. Pre-op instructions went over with patient. No questions at this time for patient. Chart to anesthesia for review.

## 2022-06-11 ENCOUNTER — Encounter (HOSPITAL_COMMUNITY): Payer: Self-pay | Admitting: Vascular Surgery

## 2022-06-11 NOTE — Addendum Note (Signed)
Encounter addended by: Adrian Prince, RDCS on: 06/11/2022 7:55 AM  Actions taken: Imaging Exam ended

## 2022-06-12 ENCOUNTER — Ambulatory Visit (HOSPITAL_COMMUNITY)
Admission: RE | Admit: 2022-06-12 | Payer: Commercial Managed Care - HMO | Source: Home / Self Care | Admitting: Dentistry

## 2022-06-12 SURGERY — MULTIPLE EXTRACTION WITH ALVEOLOPLASTY
Anesthesia: General

## 2022-06-16 ENCOUNTER — Telehealth: Payer: Self-pay

## 2022-06-16 DIAGNOSIS — K06020 Generalized gingival recession, unspecified: Secondary | ICD-10-CM | POA: Insufficient documentation

## 2022-06-16 DIAGNOSIS — K030 Excessive attrition of teeth: Secondary | ICD-10-CM | POA: Insufficient documentation

## 2022-06-16 DIAGNOSIS — K029 Dental caries, unspecified: Secondary | ICD-10-CM | POA: Insufficient documentation

## 2022-06-16 DIAGNOSIS — K0850 Unsatisfactory restoration of tooth, unspecified: Secondary | ICD-10-CM | POA: Insufficient documentation

## 2022-06-16 DIAGNOSIS — K0530 Chronic periodontitis, unspecified: Secondary | ICD-10-CM | POA: Insufficient documentation

## 2022-06-16 DIAGNOSIS — Z01818 Encounter for other preprocedural examination: Secondary | ICD-10-CM | POA: Insufficient documentation

## 2022-06-16 DIAGNOSIS — K08109 Complete loss of teeth, unspecified cause, unspecified class: Secondary | ICD-10-CM | POA: Insufficient documentation

## 2022-06-16 DIAGNOSIS — K036 Deposits [accretions] on teeth: Secondary | ICD-10-CM | POA: Insufficient documentation

## 2022-06-16 DIAGNOSIS — F40232 Fear of other medical care: Secondary | ICD-10-CM | POA: Insufficient documentation

## 2022-06-16 NOTE — Telephone Encounter (Signed)
   Patient Name: Rachael Stone  DOB: 1966-01-09 MRN: [id number]  Primary Cardiologist: Mertie Moores, MD  Chart reviewed as part of pre-operative protocol coverage.   Simple dental extractions (i.e. 1-2 teeth) are considered low risk procedures per guidelines and generally do not require any specific cardiac clearance. It is also generally accepted that for simple extractions and dental cleanings, there is no need to interrupt blood thinner therapy.  SBE prophylaxis is not required for the patient from a cardiac standpoint. She has history of severe AI but do not see hx of endocarditis or valve repair at this time (undergoing workup for this). Will need SBE ppx once valve is repaired.  I will route this recommendation to the requesting party via Epic fax function and remove from pre-op pool.  Please call with questions.  Charlie Pitter, PA-C 06/16/2022, 3:33 PM

## 2022-06-16 NOTE — Telephone Encounter (Signed)
...     Pre-operative Risk Assessment    Patient Name: Rachael Stone  DOB: September 28, 1965 MRN: [id number]     Request for Surgical Clearance    Procedure:  Dental Extraction - Amount of Teeth to be Pulled:  2  Date of Surgery:  Clearance TBD                                 Surgeon:  DR Durene Cal Surgeon's Group or Practice Name:  [facility] Phone number:  [phone] Fax number:  [phone]   Type of Clearance Requested:   - Medical    Type of Anesthesia:  Local    Additional requests/questions:    Gwenlyn Found   06/16/2022, 3:08 PM

## 2022-06-25 ENCOUNTER — Encounter (HOSPITAL_COMMUNITY): Payer: Self-pay | Admitting: Dentistry

## 2022-06-25 ENCOUNTER — Other Ambulatory Visit: Payer: Self-pay

## 2022-06-25 NOTE — Progress Notes (Signed)
Anesthesia Chart Review: Rachael Stone  Case: [id number] Date/Time: 06/26/22 1415   Procedure: DENTAL RESTORATION/EXTRACTIONS   Anesthesia type: General   Pre-op diagnosis: DENTAL CARIES   Location: [facility] OR ROOM 09 / [place] OR   Surgeons: Charlaine Dalton, DMD       DISCUSSION: Patient is a 56 year old female scheduled for the above procedure. She has severe AI and recently diagnosed LUL lung adenocarcinoma. Dr. [name] is seeing her for planned AVR. SBRT is planned for treatment of her lung cancer, but she has deferred referral until after heart surgery. Dental surgery indicated prior to AVR.  History includes smoking (tobacco, marijuana), nonischemic cardiomyopathy (diagnosed 09/8464), chronic systolic CHF, aortic insufficiency (severe 05/12/22 TEE), mitral regurgitation (mild 05/12/22 TEE), HTN, COPD, dyspnea, alcohol abuse (current intake documented as 6 drinks/week), prior cocaine use. S/p bronchoscopy 04/15/22 for lingular nodule, pathology + adenocarcinoma--she has deferred referral to radiation oncology until after her heart surgery.     Anesthesia team to evaluate on the day of surgery.   VS: Ht 5\' 5"  (1.651 m)   Wt 63.5 kg   LMP  (LMP Unknown)   BMI 23.30 kg/m  BP Readings from Last 3 Encounters:  06/09/22 (!) 152/74  05/30/22 (!) 165/79  05/12/22 128/72   Pulse Readings from Last 3 Encounters:  06/09/22 87  05/30/22 96  05/12/22 76     PROVIDERS: [name], MD is PCP  Mertie Moores, MD is cardiologist June Leap, DO is pulmonologist Melodie Bouillon, MD is CT surgeon   LABS: For labs on arrival as indicated. Last results in [facility] include: Lab Results  Component Value Date   WBC 5.7 05/05/2022   HGB 12.9 05/12/2022   HCT 38.0 05/12/2022   PLT 215 05/05/2022   GLUCOSE 82 05/05/2022   ALT 28 04/15/2022   AST 48 (H) 04/15/2022   NA 142 05/12/2022   K 3.2 (L) 05/12/2022   CL 104 05/05/2022   CREATININE 0.72 05/05/2022   BUN 7 05/05/2022   CO2 26  05/05/2022    IMAGES: 1V PCXR 04/15/22: IMPRESSION: No significant pneumothorax status post bronchoscopy. Bibasilar reticulonodularity is nonspecific and could reflect aspiration, atelectasis or post biopsy change.  PET Scan 04/10/22: IMPRESSION: 1. The lingular nodule is not significantly hypermetabolic. Given interval enlargement over prior CTs, indolent primary bronchogenic carcinoma remains possible. Potential clinical strategies include tissue sampling or chest CT follow-up at 3 months. 2. Age advanced coronary artery atherosclerosis. Recommend assessment of coronary risk factors. 3. The right-side of the colon appears thick walled but is underdistended. Correlate with any symptoms to suggest colitis. 4. Incidental findings, including: Aortic atherosclerosis (ICD10-I70.0) and emphysema (ICD10-J43.9). Hepatic steatosis.   CT Chest 02/27/22: IMPRESSION: 1. Lobulated lingular nodule measuring 1.5 x 1.1 cm, new from 2021 exam. This nodule abuts the left heart border and is suspicious for primary bronchogenic malignancy. Recommend PET-CT characterization. 2. Bilobed nodule versus 2 adjacent nodules in the left upper lobe posterior apex measuring up to 13 x 6 mm, unchanged in size from prior exam, and only minimally increased in size from 2017 exam. Favor benign process. 3. Sub solid 3 mm right upper lobe nodule, nonspecific. 4. Moderate emphysema. 5. Coronary artery calcifications. 6. Hepatic steatosis.   EKG: 04/15/22: Sinus rhythm with short PR LVH T wave changes, consder ischemia Since last tracing rate slower Confirmed by Cristopher Peru [phone]) on 04/15/2022 9:16:42 PM   CV: TEE 05/12/22: IMPRESSIONS   1. Tricuspid aortic valve with severe AI. Leaflet tips are thickened and  retracted with incomplete leaflet coaptation creating a central  regurgitant jet. Tachycardia was present which can affect quantitation. 2D  ERO by PISA 0.46 cm2, R vol 61 cc. 2D VC  0.52.  Holodiastolic flow reversal is present in the descending aorta. LVEF  reduced 40-45%. Mildly dilated LV cavity. All findings consistent with  severe AI. The aortic valve is tricuspid. There is mild thickening of the  aortic valve. Aortic valve  regurgitation is severe. No aortic stenosis is present.   2. Left ventricular ejection fraction, by estimation, is 40 to 45%. The  left ventricle has mildly decreased function. The left ventricle  demonstrates global hypokinesis. The left ventricular internal cavity size  was mildly dilated.   3. Right ventricular systolic function is normal. The right ventricular  size is normal. Tricuspid regurgitation signal is inadequate for assessing  PA pressure.   4. No left atrial/left atrial appendage thrombus was detected.   5. The mitral valve is grossly normal. Mild mitral valve regurgitation.  No evidence of mitral stenosis.   6. Agitated saline contrast bubble study was negative, with no evidence  of any interatrial shunt.    RHC/LHC 05/12/22:   Prox RCA to Mid RCA lesion is 30% stenosed.   1.  Widely patent coronary arteries with minimal nonobstructive plaquing in the mid LAD, mid circumflex, and mid RCA.  No high-grade coronary stenoses identified. 2.  Known severe aortic valve insufficiency by noninvasive assessment with normal right heart hemodynamics, normal PA pressure (mean PA 20 mmHg), pulmonary capillary wedge pressure 10 mmHg, preserved cardiac output   Recommendations: Continue evaluation for surgical aortic valve replacement.    Past Medical History:  Diagnosis Date   Adenocarcinoma of left lung ([place]) 04/15/2022   Lingular   Arthritis    Back pain    Cardiomyopathy ([place])    a. 04/08/2016 Echo: EF 35%, diff HK, mild LVH, Gr2 DD, mod AI, mod to sev MR, mildly dil LA.   COPD (chronic obstructive pulmonary disease) ([place])    DEPRESSIVE DISORDER NOT ELSEWHERE CLASSIFIED 12/14/2008   no current problem per patient on 04/14/22.  Qualifier:  Diagnosis of  By: [name], [name]   DYSFUNCTIONAL UTERINE BLEEDING 11/12/2007   Qualifier: Diagnosis of  By: [name], [name]     Dyspnea    ETOH abuse    Hypertension    Hypertensive heart disease with heart failure ([place])    Marijuana abuse    Last use 04/14/22   MENORRHAGIA 12/14/2008   Qualifier: Diagnosis of  By: [name], [name]     Moderate aortic insufficiency    a. 04/08/2016 Echo: mod AI.   Moderate to Severe Mitral Regurgitation    a. 04/08/2016 Echo: mod-sev MR directed centrally.   Tobacco abuse     Past Surgical History:  Procedure Laterality Date   BRONCHIAL BIOPSY  04/15/2022   Procedure: BRONCHIAL BIOPSIES;  Surgeon: [name], DO;  Location: [facility];  Service: Pulmonary;;   BRONCHIAL NEEDLE ASPIRATION BIOPSY  04/15/2022   Procedure: BRONCHIAL NEEDLE ASPIRATION BIOPSIES;  Surgeon: [name], DO;  Location: [place];  Service: Pulmonary;;   BUBBLE STUDY  05/12/2022   Procedure: BUBBLE STUDY;  Surgeon: [name], MD;  Location: [place];  Service: Cardiovascular;;   CARDIAC CATHETERIZATION N/A 04/10/2016   Procedure: Right/Left Heart Cath and Coronary Angiography;  Surgeon: [name], MD;  Location: [place] [facility];  Service: Cardiovascular;  Laterality: N/A;   EXPLORATORY LAPAROTOMY  2003  Ectopic Pregnancy - unsure which side or if tube was removed   RIGHT/LEFT HEART CATH AND CORONARY ANGIOGRAPHY N/A 05/12/2022   Procedure: RIGHT/LEFT HEART CATH AND CORONARY ANGIOGRAPHY;  Surgeon: [name], MD;  Location: [place] [facility];  Service: Cardiovascular;  Laterality: N/A;   TEE WITHOUT CARDIOVERSION N/A 06/26/2017   Procedure: TRANSESOPHAGEAL ECHOCARDIOGRAM (TEE);  Surgeon: [name], MD;  Location: [facility];  Service: Cardiovascular;  Laterality: N/A;   TEE WITHOUT CARDIOVERSION N/A 05/12/2022   Procedure: TRANSESOPHAGEAL ECHOCARDIOGRAM (TEE);  Surgeon: [name], MD;  Location: [place];   Service: Cardiovascular;  Laterality: N/A;   TUBAL LIGATION      MEDICATIONS: No current facility-administered medications for this encounter.    acetaminophen (TYLENOL) 500 MG tablet   amLODipine-olmesartan (AZOR) 5-20 MG tablet   aspirin EC 81 MG tablet   carvedilol (COREG) 3.125 MG tablet   cyanocobalamin (VITAMIN B12) 1000 MCG tablet   furosemide (LASIX) 20 MG tablet   gabapentin (NEURONTIN) 100 MG capsule   losartan (COZAAR) 25 MG tablet   Menthol, Topical Analgesic, (ICY HOT EX)   Multiple Vitamins-Minerals (WOMENS MULTI PO)   potassium chloride (KLOR-CON) 10 MEQ tablet   metroNIDAZOLE (METROGEL) 0.75 % vaginal gel    Myra Gianotti, PA-C Surgical Short Stay/Anesthesiology [facility] Phone [phone] [facility] Phone [phone] 06/25/2022 10:04 AM

## 2022-06-25 NOTE — Progress Notes (Signed)
PCP - [name], MD Cardiologist - Thayer Headings, MD  Chest x-ray - 05/12/22 EKG - 04/15/22 Stress Test - Denies ECHO - 05/12/22 Cardiac Cath - 05/12/22  ERAS Protcol - NPO  Anesthesia review: Y  Patient verbally denies any shortness of breath, fever, cough and chest pain during phone call   -------------  SDW INSTRUCTIONS given:  Your procedure is scheduled on 04/26/22.  [facility]" at [facility], and check in at the Admitting office.  Call this number if you have problems the morning of surgery:  [phone]   Remember:  Do not eat or drink after midnight the night before your surgery    Take these medicines the morning of surgery with A SIP OF WATER  carvedilol (COREG)  gabapentin (NEURONTIN)  acetaminophen (TYLENOL)-if needed  As of today, STOP taking any Aspirin (unless otherwise instructed by your surgeon) Aleve, Naproxen, Ibuprofen, Motrin, Advil, Goody's, BC's, all herbal medications, fish oil, and all vitamins.                      Do not wear jewelry, make up, or nail polish            Do not wear lotions, powders, perfumes/colognes, or deodorant.            Do not shave 48 hours prior to surgery.  Men may shave face and neck.            Do not bring valuables to the hospital.            [facility] is not responsible for any belongings or valuables.  Do NOT Smoke (Tobacco/Vaping) 24 hours prior to your procedure If you use a CPAP at night, you may bring all equipment for your overnight stay.   Contacts, glasses, dentures or bridgework may not be worn into surgery.      For patients admitted to the hospital, discharge time will be determined by your treatment team.   Patients discharged the day of surgery will not be allowed to drive home, and someone needs to stay with them for 24 hours.    Special instructions:   [place]- Preparing For Surgery  Before surgery, you can play an important role. Because skin is not  sterile, your skin needs to be as free of germs as possible. You can reduce the number of germs on your skin by washing with CHG (chlorahexidine gluconate) Soap before surgery.  CHG is an antiseptic cleaner which kills germs and bonds with the skin to continue killing germs even after washing.    Oral Hygiene is also important to reduce your risk of infection.  Remember - BRUSH YOUR TEETH THE MORNING OF SURGERY WITH YOUR REGULAR TOOTHPASTE  Please do not use if you have an allergy to CHG or antibacterial soaps. If your skin becomes reddened/irritated stop using the CHG.  Do not shave (including legs and underarms) for at least 48 hours prior to first CHG shower. It is OK to shave your face.  Please follow these instructions carefully.   Shower the NIGHT BEFORE SURGERY and the MORNING OF SURGERY with DIAL Soap.   Pat yourself dry with a CLEAN TOWEL.  Wear CLEAN PAJAMAS to bed the night before surgery  Place CLEAN SHEETS on your bed the night of your first shower and DO NOT SLEEP WITH PETS.   Day of Surgery: Please shower morning of surgery  Wear Clean/Comfortable clothing the morning of  surgery Do not apply any deodorants/lotions.   Remember to brush your teeth WITH YOUR REGULAR TOOTHPASTE.   Questions were answered. Patient verbalized understanding of instructions.

## 2022-06-25 NOTE — Anesthesia Preprocedure Evaluation (Addendum)
Anesthesia Evaluation  Patient identified by MRN, date of birth, ID band Patient awake    Reviewed: Allergy & Precautions, Patient's Chart, lab work & pertinent test results  Airway Mallampati: I  TM Distance: >3 FB Neck ROM: Full    Dental  (+) Edentulous Upper   Pulmonary COPD, Current Smoker and Patient abstained from smoking.   breath sounds clear to auscultation       Cardiovascular hypertension, Pt. on medications and Pt. on home beta blockers  Rhythm:Regular Rate:Normal + Diastolic murmurs TEE 05/12/22: IMPRESSIONS   1. Tricuspid aortic valve with severe AI. Leaflet tips are thickened and  retracted with incomplete leaflet coaptation creating a central  regurgitant jet. Tachycardia was present which can affect quantitation. 2D  ERO by PISA 0.46 cm2, R vol 61 cc. 2D VC  0.52. Holodiastolic flow reversal is present in the descending aorta. LVEF  reduced 40-45%. Mildly dilated LV cavity. All findings consistent with  severe AI. The aortic valve is tricuspid. There is mild thickening of the  aortic valve. Aortic valve  regurgitation is severe. No aortic stenosis is present.   2. Left ventricular ejection fraction, by estimation, is 40 to 45%. The  left ventricle has mildly decreased function. The left ventricle  demonstrates global hypokinesis. The left ventricular internal cavity size  was mildly dilated.   3. Right ventricular systolic function is normal. The right ventricular  size is normal. Tricuspid regurgitation signal is inadequate for assessing  PA pressure.   4. No left atrial/left atrial appendage thrombus was detected.   5. The mitral valve is grossly normal. Mild mitral valve regurgitation.  No evidence of mitral stenosis.   6. Agitated saline contrast bubble study was negative, with no evidence  of any interatrial shunt.      RHC/LHC 05/12/22:   Prox RCA to Mid RCA lesion is 30% stenosed.   1.  Widely  patent coronary arteries with minimal nonobstructive plaquing in the mid LAD, mid circumflex, and mid RCA.  No high-grade coronary stenoses identified. 2.  Known severe aortic valve insufficiency by noninvasive assessment with normal right heart hemodynamics, normal PA pressure (mean PA 20 mmHg), pulmonary capillary wedge pressure 10 mmHg, preserved cardiac output   Recommendations: Continue evaluation for surgical aortic valve replacement.    Neuro/Psych  PSYCHIATRIC DISORDERS Anxiety Depression    negative neurological ROS     GI/Hepatic Neg liver ROS,GERD  ,,  Endo/Other  negative endocrine ROS    Renal/GU negative Renal ROS     Musculoskeletal  (+) Arthritis ,    Abdominal   Peds  Hematology negative hematology ROS (+)   Anesthesia Other Findings   Reproductive/Obstetrics                             Anesthesia Physical Anesthesia Plan  ASA: 3  Anesthesia Plan: General   Post-op Pain Management:    Induction: Intravenous  PONV Risk Score and Plan: 3 and Ondansetron, Dexamethasone and Midazolam  Airway Management Planned: Nasal ETT  Additional Equipment: None  Intra-op Plan:   Post-operative Plan: Extubation in OR  Informed Consent: I have reviewed the patients History and Physical, chart, labs and discussed the procedure including the risks, benefits and alternatives for the proposed anesthesia with the patient or authorized representative who has indicated his/her understanding and acceptance.     Dental advisory given  Plan Discussed with: CRNA  Anesthesia Plan Comments: (PAT note written 06/25/2022 by Revonda Standard  Zelenak, PA-C.  )       Anesthesia Quick Evaluation

## 2022-06-26 ENCOUNTER — Ambulatory Visit (HOSPITAL_COMMUNITY): Payer: Commercial Managed Care - HMO | Admitting: Vascular Surgery

## 2022-06-26 ENCOUNTER — Ambulatory Visit (HOSPITAL_COMMUNITY)
Admission: RE | Admit: 2022-06-26 | Discharge: 2022-06-26 | Disposition: A | Discharge status: Home / Self Care | Payer: Commercial Managed Care - HMO | Attending: Dentistry | Admitting: Dentistry

## 2022-06-26 ENCOUNTER — Other Ambulatory Visit: Payer: Self-pay

## 2022-06-26 ENCOUNTER — Encounter (HOSPITAL_COMMUNITY)
Admission: RE | Disposition: A | Discharge status: Home / Self Care | Payer: Self-pay | Source: Home / Self Care | Attending: Dentistry

## 2022-06-26 ENCOUNTER — Encounter (HOSPITAL_COMMUNITY): Payer: Self-pay | Admitting: Dentistry

## 2022-06-26 ENCOUNTER — Ambulatory Visit (HOSPITAL_BASED_OUTPATIENT_CLINIC_OR_DEPARTMENT_OTHER): Payer: Commercial Managed Care - HMO | Admitting: Vascular Surgery

## 2022-06-26 DIAGNOSIS — F419 Anxiety disorder, unspecified: Secondary | ICD-10-CM | POA: Insufficient documentation

## 2022-06-26 DIAGNOSIS — I10 Essential (primary) hypertension: Secondary | ICD-10-CM | POA: Insufficient documentation

## 2022-06-26 DIAGNOSIS — I700 Atherosclerosis of aorta: Secondary | ICD-10-CM | POA: Insufficient documentation

## 2022-06-26 DIAGNOSIS — K029 Dental caries, unspecified: Secondary | ICD-10-CM

## 2022-06-26 DIAGNOSIS — I2510 Atherosclerotic heart disease of native coronary artery without angina pectoris: Secondary | ICD-10-CM | POA: Diagnosis not present

## 2022-06-26 DIAGNOSIS — C3412 Malignant neoplasm of upper lobe, left bronchus or lung: Secondary | ICD-10-CM | POA: Diagnosis not present

## 2022-06-26 DIAGNOSIS — J449 Chronic obstructive pulmonary disease, unspecified: Secondary | ICD-10-CM

## 2022-06-26 DIAGNOSIS — J439 Emphysema, unspecified: Secondary | ICD-10-CM | POA: Insufficient documentation

## 2022-06-26 DIAGNOSIS — K219 Gastro-esophageal reflux disease without esophagitis: Secondary | ICD-10-CM | POA: Insufficient documentation

## 2022-06-26 DIAGNOSIS — F32A Depression, unspecified: Secondary | ICD-10-CM | POA: Diagnosis not present

## 2022-06-26 DIAGNOSIS — M1990 Unspecified osteoarthritis, unspecified site: Secondary | ICD-10-CM | POA: Diagnosis not present

## 2022-06-26 DIAGNOSIS — F418 Other specified anxiety disorders: Secondary | ICD-10-CM | POA: Diagnosis not present

## 2022-06-26 DIAGNOSIS — I509 Heart failure, unspecified: Secondary | ICD-10-CM | POA: Insufficient documentation

## 2022-06-26 DIAGNOSIS — K036 Deposits [accretions] on teeth: Secondary | ICD-10-CM

## 2022-06-26 DIAGNOSIS — F17210 Nicotine dependence, cigarettes, uncomplicated: Secondary | ICD-10-CM | POA: Diagnosis not present

## 2022-06-26 DIAGNOSIS — R0602 Shortness of breath: Secondary | ICD-10-CM | POA: Diagnosis not present

## 2022-06-26 DIAGNOSIS — K0530 Chronic periodontitis, unspecified: Secondary | ICD-10-CM | POA: Diagnosis not present

## 2022-06-26 DIAGNOSIS — I110 Hypertensive heart disease with heart failure: Secondary | ICD-10-CM | POA: Diagnosis not present

## 2022-06-26 DIAGNOSIS — I080 Rheumatic disorders of both mitral and aortic valves: Secondary | ICD-10-CM | POA: Insufficient documentation

## 2022-06-26 DIAGNOSIS — I429 Cardiomyopathy, unspecified: Secondary | ICD-10-CM | POA: Insufficient documentation

## 2022-06-26 HISTORY — DX: Chronic obstructive pulmonary disease, unspecified: J44.9

## 2022-06-26 HISTORY — PX: TOOTH EXTRACTION: SHX859

## 2022-06-26 LAB — COMPREHENSIVE METABOLIC PANEL
ALT: 19 U/L (ref 0–44)
AST: 34 U/L (ref 15–41)
Albumin: 3.4 g/dL — ABNORMAL LOW (ref 3.5–5.0)
Alkaline Phosphatase: 59 U/L (ref 38–126)
Anion gap: 11 (ref 5–15)
BUN: 7 mg/dL (ref 6–20)
CO2: 24 mmol/L (ref 22–32)
Calcium: 8.9 mg/dL (ref 8.9–10.3)
Chloride: 103 mmol/L (ref 98–111)
Creatinine, Ser: 0.74 mg/dL (ref 0.44–1.00)
GFR, Estimated: >60 mL/min (ref >60)
Glucose, Bld: 113 mg/dL — ABNORMAL HIGH (ref 70–99)
Potassium: 2.9 mmol/L — ABNORMAL LOW (ref 3.5–5.1)
Sodium: 138 mmol/L (ref 135–145)
Total Bilirubin: 0.4 mg/dL (ref 0.3–1.2)
Total Protein: 7.3 g/dL (ref 6.5–8.1)

## 2022-06-26 LAB — CBC
HCT: 42.8 % (ref 36.0–46.0)
Hemoglobin: 14.1 g/dL (ref 12.0–15.0)
MCH: 30.1 pg (ref 26.0–34.0)
MCHC: 32.9 g/dL (ref 30.0–36.0)
MCV: 91.5 fL (ref 80.0–100.0)
Platelets: 228 10*3/uL (ref 150–400)
RBC: 4.68 MIL/uL (ref 3.87–5.11)
RDW: 13.9 % (ref 11.5–15.5)
WBC: 7 10*3/uL (ref 4.0–10.5)
nRBC: 0 % (ref 0.0–0.2)

## 2022-06-26 SURGERY — DENTAL RESTORATION/EXTRACTIONS
Anesthesia: General

## 2022-06-26 MED ORDER — ACETAMINOPHEN 325 MG PO TABS
325.0000 mg | ORAL_TABLET | ORAL | Status: DC | PRN
  Administered 2022-06-26: 650 mg via ORAL

## 2022-06-26 MED ORDER — DEXAMETHASONE SODIUM PHOSPHATE 10 MG/ML IJ SOLN
INTRAMUSCULAR | Status: DC | PRN
  Administered 2022-06-26: 10 mg via INTRAVENOUS

## 2022-06-26 MED ORDER — BUPIVACAINE-EPINEPHRINE (PF) 0.5% -1:200000 IJ SOLN
INTRAMUSCULAR | Status: AC
  Filled 2022-06-26: qty 7.2

## 2022-06-26 MED ORDER — FENTANYL CITRATE (PF) 100 MCG/2ML IJ SOLN
INTRAMUSCULAR | Status: AC
  Filled 2022-06-26: qty 2

## 2022-06-26 MED ORDER — PROPOFOL 10 MG/ML IV BOLUS
INTRAVENOUS | Status: AC
  Filled 2022-06-26: qty 20

## 2022-06-26 MED ORDER — FENTANYL CITRATE (PF) 250 MCG/5ML IJ SOLN
INTRAMUSCULAR | Status: AC
  Filled 2022-06-26: qty 5

## 2022-06-26 MED ORDER — FENTANYL CITRATE (PF) 250 MCG/5ML IJ SOLN
INTRAMUSCULAR | Status: DC | PRN
  Administered 2022-06-26 (×4): 50 ug via INTRAVENOUS

## 2022-06-26 MED ORDER — ONDANSETRON HCL 4 MG/2ML IJ SOLN
INTRAMUSCULAR | Status: DC | PRN
  Administered 2022-06-26: 4 mg via INTRAVENOUS

## 2022-06-26 MED ORDER — SUGAMMADEX SODIUM 200 MG/2ML IV SOLN
INTRAVENOUS | Status: DC | PRN
  Administered 2022-06-26 (×2): 200 mg via INTRAVENOUS

## 2022-06-26 MED ORDER — LACTATED RINGERS IV SOLN: INTRAVENOUS | Status: DC

## 2022-06-26 MED ORDER — LIDOCAINE-EPINEPHRINE 2 %-1:100000 IJ SOLN
INTRAMUSCULAR | Status: AC
  Filled 2022-06-26: qty 10.2

## 2022-06-26 MED ORDER — ACETAMINOPHEN 325 MG PO TABS
ORAL_TABLET | ORAL | Status: AC
  Filled 2022-06-26: qty 2

## 2022-06-26 MED ORDER — ONDANSETRON HCL 4 MG/2ML IJ SOLN
INTRAMUSCULAR | Status: AC
  Filled 2022-06-26: qty 4

## 2022-06-26 MED ORDER — LIDOCAINE 2% (20 MG/ML) 5 ML SYRINGE
INTRAMUSCULAR | Status: DC | PRN
  Administered 2022-06-26: 40 mg via INTRAVENOUS

## 2022-06-26 MED ORDER — MIDAZOLAM HCL 2 MG/2ML IJ SOLN
INTRAMUSCULAR | Status: DC | PRN
  Administered 2022-06-26: 2 mg via INTRAVENOUS

## 2022-06-26 MED ORDER — CEFAZOLIN SODIUM-DEXTROSE 2-4 GM/100ML-% IV SOLN
2.0000 g | INTRAVENOUS | Status: AC
  Administered 2022-06-26: 2 g via INTRAVENOUS
  Filled 2022-06-26: qty 100

## 2022-06-26 MED ORDER — MIDAZOLAM HCL 2 MG/2ML IJ SOLN
INTRAMUSCULAR | Status: AC
  Filled 2022-06-26: qty 2

## 2022-06-26 MED ORDER — FENTANYL CITRATE (PF) 100 MCG/2ML IJ SOLN
25.0000 ug | INTRAMUSCULAR | Status: DC | PRN
  Administered 2022-06-26: 50 ug via INTRAVENOUS

## 2022-06-26 MED ORDER — ACETAMINOPHEN 160 MG/5ML PO SOLN: 325.0000 mg | ORAL | Status: DC | PRN

## 2022-06-26 MED ORDER — ACETAMINOPHEN 160 MG/5ML PO SUSP
ORAL | Status: AC
  Filled 2022-06-26: qty 25

## 2022-06-26 MED ORDER — 0.9 % SODIUM CHLORIDE (POUR BTL) OPTIME
TOPICAL | Status: DC | PRN
  Administered 2022-06-26: 1000 mL

## 2022-06-26 MED ORDER — DEXAMETHASONE SODIUM PHOSPHATE 10 MG/ML IJ SOLN
INTRAMUSCULAR | Status: AC
  Filled 2022-06-26: qty 2

## 2022-06-26 MED ORDER — LIDOCAINE-EPINEPHRINE 2 %-1:100000 IJ SOLN
INTRAMUSCULAR | Status: DC | PRN
  Administered 2022-06-26: 3.5 mL via INTRADERMAL

## 2022-06-26 MED ORDER — CHLORHEXIDINE GLUCONATE 0.12 % MT SOLN
15.0000 mL | Freq: Once | OROMUCOSAL | Status: AC
  Administered 2022-06-26: 15 mL via OROMUCOSAL
  Filled 2022-06-26: qty 15

## 2022-06-26 MED ORDER — ROCURONIUM BROMIDE 10 MG/ML (PF) SYRINGE
PREFILLED_SYRINGE | INTRAVENOUS | Status: DC | PRN
  Administered 2022-06-26: 50 mg via INTRAVENOUS

## 2022-06-26 MED ORDER — OXYMETAZOLINE HCL 0.05 % NA SOLN
NASAL | Status: AC
  Filled 2022-06-26: qty 30

## 2022-06-26 MED ORDER — PROPOFOL 10 MG/ML IV BOLUS
INTRAVENOUS | Status: DC | PRN
  Administered 2022-06-26: 130 mg via INTRAVENOUS
  Administered 2022-06-26: 30 mg via INTRAVENOUS

## 2022-06-26 MED ORDER — ORAL CARE MOUTH RINSE: 15.0000 mL | Freq: Once | OROMUCOSAL | Status: AC

## 2022-06-26 MED ORDER — HEMOSTATIC AGENTS (NO CHARGE) OPTIME
TOPICAL | Status: DC | PRN
  Administered 2022-06-26: 1 via TOPICAL

## 2022-06-26 MED ORDER — OXYMETAZOLINE HCL 0.05 % NA SOLN
NASAL | Status: DC | PRN
  Administered 2022-06-26 (×2): 2 via NASAL

## 2022-06-26 SURGICAL SUPPLY — 36 items
ALCOHOL 70% 16 OZ (MISCELLANEOUS) ×1 IMPLANT
BAG COUNTER SPONGE SURGICOUNT (BAG) ×1 IMPLANT
BLADE SURG 15 STRL LF DISP TIS (BLADE) ×1 IMPLANT
BLADE SURG 15 STRL SS (BLADE) ×1
COVER SURGICAL LIGHT HANDLE (MISCELLANEOUS) ×1 IMPLANT
GAUZE 4X4 16PLY ~~LOC~~+RFID DBL (SPONGE) ×1 IMPLANT
GAUZE PACKING FOLDED 2  STR (GAUZE/BANDAGES/DRESSINGS) ×1
GAUZE PACKING FOLDED 2 STR (GAUZE/BANDAGES/DRESSINGS) ×1 IMPLANT
GLOVE SURG ENC MOIS LTX SZ6.5 (GLOVE) ×1 IMPLANT
GLOVE SURG POLYISO LF SZ6 (GLOVE) ×1 IMPLANT
GOWN STRL REUS W/ TWL LRG LVL3 (GOWN DISPOSABLE) ×2 IMPLANT
GOWN STRL REUS W/TWL LRG LVL3 (GOWN DISPOSABLE) ×2
KIT BASIN OR (CUSTOM PROCEDURE TRAY) ×1 IMPLANT
KIT TURNOVER KIT B (KITS) ×1 IMPLANT
MANIFOLD NEPTUNE II (INSTRUMENTS) ×1 IMPLANT
NDL BLUNT 16X1.5 OR ONLY (NEEDLE) ×1 IMPLANT
NDL DENTAL 27 LONG (NEEDLE) ×2 IMPLANT
NEEDLE BLUNT 16X1.5 OR ONLY (NEEDLE) ×1 IMPLANT
NEEDLE DENTAL 27 LONG (NEEDLE) ×2 IMPLANT
NS IRRIG 1000ML POUR BTL (IV SOLUTION) ×1 IMPLANT
PACK EENT II TURBAN DRAPE (CUSTOM PROCEDURE TRAY) ×1 IMPLANT
PAD ARMBOARD 7.5X6 YLW CONV (MISCELLANEOUS) ×1 IMPLANT
SPONGE SURGIFOAM ABS GEL 100 (HEMOSTASIS) IMPLANT
SPONGE SURGIFOAM ABS GEL 12-7 (HEMOSTASIS) IMPLANT
SPONGE SURGIFOAM ABS GEL SZ50 (HEMOSTASIS) IMPLANT
SUCTION FRAZIER HANDLE 10FR (MISCELLANEOUS) ×1
SUCTION TUBE FRAZIER 10FR DISP (MISCELLANEOUS) ×1 IMPLANT
SUT CHROMIC 3 0 PS 2 (SUTURE) ×2 IMPLANT
SUT CHROMIC 4 0 P 3 18 (SUTURE) IMPLANT
SYR 50ML SLIP (SYRINGE) ×1 IMPLANT
SYR BULB IRRIG 60ML STRL (SYRINGE) ×1 IMPLANT
TOWEL GREEN STERILE FF (TOWEL DISPOSABLE) ×1 IMPLANT
TUBE CONNECTING 12X1/4 (SUCTIONS) ×1 IMPLANT
WATER STERILE IRR 1000ML POUR (IV SOLUTION) ×1 IMPLANT
WATER TABLETS ICX (MISCELLANEOUS) ×1 IMPLANT
YANKAUER SUCT BULB TIP NO VENT (SUCTIONS) ×1 IMPLANT

## 2022-06-26 NOTE — Transfer of Care (Signed)
Immediate Anesthesia Transfer of Care Note  Patient: Rachael Stone  Procedure(s) Performed: DENTAL RESTORATION/EXTRACTIONS  Patient Location: PACU  Anesthesia Type:General  Level of Consciousness: drowsy and patient cooperative  Airway & Oxygen Therapy: Patient Spontanous Breathing  Post-op Assessment: Report given to RN and Post -op Vital signs reviewed and stable  Post vital signs: Reviewed and stable  Last Vitals:  Vitals Value Taken Time  BP 168/151 06/26/22 1449  Temp    Pulse 80 06/26/22 1451  Resp 17 06/26/22 1451  SpO2 100 % 06/26/22 1451  Vitals shown include unvalidated device data.  Last Pain:  Vitals:   06/26/22 1216  TempSrc:   PainSc: 0-No pain         Complications: No notable events documented.

## 2022-06-26 NOTE — Interval H&P Note (Signed)
History and Physical Interval Note:  06/26/2022   Rachael Stone  has presented today for surgery, with the diagnosis of dental caries.  The various methods of treatment have been discussed with the patient and family. After consideration of risks, benefits and other options for treatment, the patient has consented to the procedure(s): MULTIPLE EXTRACTIONS WITH ALVEOLOPLASTY as a surgical intervention.  The patient's history has been reviewed, patient examined, no change in status, stable for surgery.  I have reviewed the patient's chart and labs.  Questions were answered to the patient's satisfaction.     -Sandi Mariscal, DMD

## 2022-06-26 NOTE — Anesthesia Procedure Notes (Signed)
Procedure Name: Intubation Date/Time: 06/26/2022 2:03 PM  Performed by: Lance Coon, CRNAPre-anesthesia Checklist: Patient identified, Emergency Drugs available, Suction available, Patient being monitored and Timeout performed Patient Re-evaluated:Patient Re-evaluated prior to induction Oxygen Delivery Method: Circle system utilized Preoxygenation: Pre-oxygenation with 100% oxygen Induction Type: IV induction Ventilation: Mask ventilation without difficulty Laryngoscope Size: Miller and 3 Grade View: Grade I Nasal Tubes: Right, Nasal prep performed, Nasal Rae and Magill forceps - small, utilized Tube size: 6.5 mm Number of attempts: 1 Placement Confirmation: ETT inserted through vocal cords under direct vision, positive ETCO2 and breath sounds checked- equal and bilateral Tube secured with: Tape Dental Injury: Teeth and Oropharynx as per pre-operative assessment

## 2022-06-26 NOTE — Op Note (Signed)
[facility] Department of Dental Medicine   OPERATIVE REPORT  DATE OF SURGERY:   06/26/2022  PATIENT'S NAME:   Rachael Stone DATE OF BIRTH:   1965/09/03 MEDICAL RECORD NUMBER:  [id number]  SURGEON:   Ramirez Fullbright B. 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   Sinus tachycardia 05/16/2021   Alcoholism ([place])    Hypokalemia    Hospital discharge follow-up    BV (bacterial vaginosis) 01/26/2017   Trichomonal infection 01/26/2017   Essential hypertension 11/14/2016   Normal coronary arteries 04/11/2016   Hypertensive heart disease with heart failure (H[facility])    NICM (nonischemic cardiomyopathy) ([facility])    Nonrheumatic aortic valve insufficiency    Moderate to Severe Mitral Regurgitation    Tobacco abuse    Acute pulmonary edema ([place]) 04/08/2016   Elevated blood pressure 04/08/2016   Dyspnea    VAGINAL DISCHARGE 09/10/2009   CHEST PAIN UNSPECIFIED 08/13/2009   DEPRESSIVE DISORDER NOT ELSEWHERE CLASSIFIED 12/14/2008   MENORRHAGIA 12/14/2008   NAUSEA, CHRONIC 12/14/2008   ELEVATED BLOOD PRESSURE WITHOUT DIAGNOSIS OF HYPERTENSION 12/14/2008   DYSFUNCTIONAL UTERINE BLEEDING 11/12/2007   TOBACCO DEPENDENCE 09/10/2006   GASTROESOPHAGEAL REFLUX, NO ESOPHAGITIS 09/10/2006    POSTOPERATIVE DIAGNOSES:  Dental caries, chronic periodontitis   PROCEDURES PERFORMED: Extractions of teeth numbers 27, 29 and  31 Gross debridement of remaining dentition  ANESTHESIA:  General anesthesia via nasal endotracheal tube.  MEDICATIONS: Ancef 2 g IV prior to invasive dental procedures. Local anesthesia with a total utilization of 2 cartridges of 34 mg of lidocaine with 0.018 mg of epinephrine/ea.  SPECIMENS:  3 teeth that were extracted and discarded  DRAINS/CULTURES:  None  COMPLICATIONS:  None  ESTIMATED BLOOD LOSS:  5 mL  INTRAVENOUS FLUIDS:  10 mL of Lactated ringers solution  INDICATIONS:  The patient was recently diagnosed with severe aortic insufficiency and is anticipating AVR.  A medically necessary dental consult was then requested to evaluate the patient for any dental/orofacial infection and their overall oral health.  The patient was examined and subsequently treatment planned for extractions of severely decayed and chronically infected teeth.  This treatment plan was made to decrease the perioperative and postoperative risks and complications associated with dental/orofacial infection from affecting the patient's systemic health.  OPERATIVE FINDINGS:  The patient was examined in operating room number 9.  The indicated teeth were identified and verified for extraction. The patient was noted be affected by severe dental decay and chronic periodontitis.  DESCRIPTION OF PROCEDURE:  The patient was identified in the holding area and brought to the main operating room number 9 by the anesthesia team. The patient was then placed in the supine position on the operating table.  General anesthesia was then induced per the anesthesia team. The patient was then prepped and draped in the usual sterile fashion for dental medicine procedures.  A timeout was performed. The patient was identified and procedures were verified. A throat pack was placed at this time.  The oral cavity was then thoroughly examined with the findings noted above. The patient was then ready for the dental medicine procedure as  follows:   ANESTHESIA: Local anesthesia was administered sequentially with a total utilization of 2 cartridges each containing 34 mg of lidocaine with 0.018 mg of epinephrine.  Location of anesthesia included lower right quadrant mental nerve block, lingual and buccal infiltration.  Aspiration was negative.  ROUTINE EXTRACTIONS: The mandibular right quadrant was then approached. The teeth were subluxated with a series of straight elevators.  Teeth numbers 27, 29 and 31 were then removed utilizing a 151 forceps and #23 cowhorn without complications. The tissues were approximated and trimmed appropriately to help achieve primary closure. The surgical sites were then irrigated with copious amounts of sterile saline.  Surgi Foam was placed in each extraction site.  The surgical sites were closed using 3-0 chromic gut sutures as follows: 3 simple interrupted style sutures.  FULL MOUTH DEBRIDEMENT: Removed all supra- and subgingival calculus and plaque from remaining dentition using KAVO and hand instruments along with sterile irrigation.   END OF PROCEDURE: Thorough oral irrigation with sterile saline was performed.  Hemostasis was observed.  The patient was examined for complications, and seeing none, the dental medicine procedure was deemed to be complete.  The throat pack was removed at this time.  A series of 4x4 gauze were placed in the mouth to aid hemostasis as needed.  The patient was then handed over to the anesthesia team for final disposition.  After an appropriate amount of time, the patient was extubated and taken to the postanesthsia care unit in stable condition.  All counts were correct for the dental medicine procedure.    Latta Benson Norway, DMD    Phone: [phone]    Pager:  [phone]

## 2022-06-26 NOTE — Discharge Instructions (Signed)
[facility] DEPARTMENT OF DENTAL MEDICINE Dr. Debe Coder B. Benson Norway, D.M.D. Phone: [phone] Fax: [phone]    MOUTH CARE AFTER SURGERY     FACTS: Ice used in ice bag helps keep the swelling down, and can help lessen the pain for the first 24 hours after surgery. It is easier to treat pain BEFORE it happens. Spitting disturbs the clot and may cause bleeding to start again, or to get worse. Smoking delays healing and can cause complications. Sharing prescriptions can be dangerous.  Do not take medications not recently prescribed for you. Antibiotics may stop birth control pills from working.  Use other means of birth control while on antibiotics. Warm salt water rinses after the first 24 hours will help lessen the swelling:  Use 1/2 teaspoonful of table salt per oz.of water.    DO NOT: Spit Drink through a straw It is strongly advised not to smoke, dip snuff or chew tobacco for at least 3 days. Eat sharp or crunchy foods.  Avoid the area of surgery when chewing. Stop your antibiotics before your instructions say to do so. Eat hot foods until bleeding has stopped.  If you need to, let your food cool down to room temperature.      WHAT TO EXPECT: Some swelling, especially during the first 2-3 days. Soreness or discomfort in varying degrees.  Follow your dentist's instructions about how to handle pain before it starts. Pinkish saliva or light blood in saliva, or on your pillow in the morning.  This can last around 24 hours. Bruising inside or outside the mouth.  This may not show up until 2-3 days after surgery.  Don't worry, it will go away in time. Pieces of "bone" may work themselves loose.  It's OK.  If they bother you, let us know.     WHAT TO DO IMMEDIATELY AFTER SURGERY: Bite on gauze with steady pressure for 30-45 minutes at a time.  Switch out the gauze after 30-45 minutes for clean gauze, and continue this for 1-2 hours or until  bleeding subsides. Do not chew on the gauze. Do not lie down flat.  Raise your head support especially for the first 24 hours. Apply ice to your face on the side of the surgery.  You may apply it 20 minutes on and a few minutes off.  Ice for 8-12 hours.  You may use ice up to 24 hours. Before the numbness wears off, take a pain pill as instructed. Prescription pain medication is not always required. For postoperative pain management: recommend taking over-the-counter Acetaminophen (Tylenol) as needed. DO NOT take additional Acetaminophen (Tylenol) if you are already taking this medicine or if another medicine you are taking has Acetaminophen in it.     SWELLING: Expect swelling for the first couple of days.  It should get better after that. If swelling increases 3 days or so after surgery, let us know as soon as possible.    FEVER: Take Tylenol every 4 hours if needed to lower your temperature, especially if it is at 100oF or higher. Drink lots of fluids. If the fever does not go away, let us know.    BREATHING: Any unusual difficulty breathing means you have to have someone bring you to the emergency room ASAP.    BLEEDING: Light oozing is expected for 24 hours or so. Prop head up with pillows. Do not spit. Do not confuse bright red fresh flowing blood with lots of saliva colored with a little  bit of blood. If you notice some bleeding, place gauze or a tea bag where it is bleeding and apply CONSTANT pressure by biting down for 1 hour.  Avoid talking during this time.  Do not remove the gauze or tea bag during this hour to "check" the bleeding. If you notice bright RED bleeding FLOWING out of particular area, and filling the floor of your mouth, put a wad of gauze on that area, bite down firmly and constantly.  Call us immediately.  If we're closed, have someone bring you to the emergency room.     ORAL HYGIENE: Brush your teeth as usual after meals and before bedtime. Use a soft  toothbrush around the area of surgery. DO NOT AVOID BRUSHING.  Otherwise bacteria(germs) will grow and may delay healing or encourage infection. Since you cannot spit, just gently rinse and let the water flow out of your mouth. DO NOT SWISH HARD.     EATING: Cool liquids are a good point to start.  Increase to soft foods as tolerated.     PRESCRIPTIONS: Follow the directions for your prescriptions exactly as written. If your doctor gave you a narcotic pain medication, do not drive, operate machinery or drink alcohol when on that medication.    Questions?  Call our office during office hours at [phone] or call the Emergency Room at [phone].

## 2022-06-27 ENCOUNTER — Encounter: Payer: Self-pay | Admitting: *Deleted

## 2022-06-27 ENCOUNTER — Encounter (HOSPITAL_COMMUNITY): Payer: Self-pay | Admitting: Dentistry

## 2022-06-27 ENCOUNTER — Other Ambulatory Visit: Payer: Self-pay | Admitting: *Deleted

## 2022-06-27 DIAGNOSIS — I351 Nonrheumatic aortic (valve) insufficiency: Secondary | ICD-10-CM

## 2022-06-27 NOTE — Anesthesia Postprocedure Evaluation (Signed)
Anesthesia Post Note  Patient: Rachael Stone  Procedure(s) Performed: DENTAL RESTORATION/EXTRACTIONS     Patient location during evaluation: PACU Anesthesia Type: General Level of consciousness: awake and alert Pain management: pain level controlled Vital Signs Assessment: post-procedure vital signs reviewed and stable Respiratory status: spontaneous breathing, nonlabored ventilation, respiratory function stable and patient connected to nasal cannula oxygen Cardiovascular status: blood pressure returned to baseline and stable Postop Assessment: no apparent nausea or vomiting Anesthetic complications: no   No notable events documented.  Last Vitals:  Vitals:   06/26/22 1515 06/26/22 1530  BP: (!) 151/72 (!) 145/81  Pulse: 73 85  Resp: 17 15  Temp:  36.7 C  SpO2: 91% 95%    Last Pain:  Vitals:   06/26/22 1515  TempSrc:   PainSc: [name]

## 2022-07-01 ENCOUNTER — Ambulatory Visit (INDEPENDENT_AMBULATORY_CARE_PROVIDER_SITE_OTHER): Payer: Commercial Managed Care - HMO | Admitting: Dentistry

## 2022-07-01 VITALS — BP 170/89 | HR 98 | Temp 98.3°F

## 2022-07-01 DIAGNOSIS — K08199 Complete loss of teeth due to other specified cause, unspecified class: Secondary | ICD-10-CM

## 2022-07-01 NOTE — Progress Notes (Signed)
[facility] Department of Dental Medicine     TODAY'S VISIT:   POSTOP/FOLLOW-UP     ASSESSMENT: The patient continues to heal well and consistent with dental procedures performed.     RECOMMENDATIONS: Establish care at an outside dental office of the patient's choice for routine dental care including replacement of missing teeth as needed.     PLAN: Follow-up as needed. Call if any questions or concerns arise.    Service Date:   07/01/2022  Patient Name:   Rachael Stone Date of Birth:   May 11, 1966 Medical Record Number: [id number]  Referring Provider:          Melodie Bouillon, MD   HISTORY OF PRESENT ILLNESS: [name] presents today for a postoperative visit status-post extractions  of teeth numbers 27, 29 and 31 in the operating room on 06/26/22.   Medical and dental history reviewed with the patient.  The patient's heart valve surgery is scheduled for 12/27 with Dr. [name].   CHIEF COMPLAINT:   Here for a postop appointment. Patient with no complaints; she reports that she has been doing well since her extractions with no significant issues or pain.   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   Sinus tachycardia 05/16/2021   Alcoholism ([place])    Hypokalemia    Hospital discharge follow-up    BV (bacterial vaginosis) 01/26/2017   Trichomonal infection 01/26/2017   Essential hypertension 11/14/2016   Normal coronary arteries 04/11/2016   Hypertensive heart disease with heart failure  (HCC)    NICM (nonischemic cardiomyopathy) ([place])    Nonrheumatic aortic valve insufficiency    Moderate to Severe Mitral Regurgitation    Tobacco abuse    Acute pulmonary edema ([place]) 04/08/2016   Elevated blood pressure 04/08/2016   Dyspnea    VAGINAL DISCHARGE 09/10/2009   CHEST PAIN UNSPECIFIED 08/13/2009   DEPRESSIVE DISORDER NOT ELSEWHERE CLASSIFIED 12/14/2008   MENORRHAGIA 12/14/2008   NAUSEA, CHRONIC 12/14/2008   ELEVATED BLOOD PRESSURE WITHOUT DIAGNOSIS OF HYPERTENSION 12/14/2008   DYSFUNCTIONAL UTERINE BLEEDING 11/12/2007   TOBACCO DEPENDENCE 09/10/2006   GASTROESOPHAGEAL REFLUX, NO ESOPHAGITIS 09/10/2006   Past Medical History:  Diagnosis Date   Adenocarcinoma of left lung ([place]) 04/15/2022   Lingular   Arthritis    Back pain    Cardiomyopathy ([place])    a. 04/08/2016 Echo: EF 35%, diff HK, mild LVH, Gr2 DD, mod AI, mod to sev MR, mildly dil LA.   COPD (chronic obstructive pulmonary disease) ([place])    DEPRESSIVE DISORDER NOT ELSEWHERE CLASSIFIED 12/14/2008   no current problem per patient on 04/14/22.  Qualifier: Diagnosis of  By: [name], [name]   DYSFUNCTIONAL UTERINE BLEEDING 11/12/2007   Qualifier: Diagnosis of  By: [name], [name]     Dyspnea    ETOH abuse    Hypertension    Hypertensive heart disease with heart failure ([place])    Marijuana abuse    Last use 04/14/22   MENORRHAGIA 12/14/2008   Qualifier: Diagnosis of  By: [name], [name]     Moderate aortic  insufficiency    a. 04/08/2016 Echo: mod AI.   Moderate to Severe Mitral Regurgitation    a. 04/08/2016 Echo: mod-sev MR directed centrally.   Tobacco abuse    Past Surgical History:  Procedure Laterality Date   BRONCHIAL BIOPSY  04/15/2022   Procedure: BRONCHIAL BIOPSIES;  Surgeon: [name], DO;  Location: [facility];  Service: Pulmonary;;   BRONCHIAL NEEDLE ASPIRATION BIOPSY  04/15/2022   Procedure: BRONCHIAL NEEDLE ASPIRATION BIOPSIES;  Surgeon: [name], DO;  Location: [facility];  Service:  Pulmonary;;   BUBBLE STUDY  05/12/2022   Procedure: BUBBLE STUDY;  Surgeon: [name], MD;  Location: [facility];  Service: Cardiovascular;;   CARDIAC CATHETERIZATION N/A 04/10/2016   Procedure: Right/Left Heart Cath and Coronary Angiography;  Surgeon: [name], MD;  Location: [facility];  Service: Cardiovascular;  Laterality: N/A;   EXPLORATORY LAPAROTOMY  2003   Ectopic Pregnancy - unsure which side or if tube was removed   RIGHT/LEFT HEART CATH AND CORONARY ANGIOGRAPHY N/A 05/12/2022   Procedure: RIGHT/LEFT HEART CATH AND CORONARY ANGIOGRAPHY;  Surgeon: [name], MD;  Location: [facility];  Service: Cardiovascular;  Laterality: N/A;   TEE WITHOUT CARDIOVERSION N/A 06/26/2017   Procedure: TRANSESOPHAGEAL ECHOCARDIOGRAM (TEE);  Surgeon: [name], MD;  Location: [facility];  Service: Cardiovascular;  Laterality: N/A;   TEE WITHOUT CARDIOVERSION N/A 05/12/2022   Procedure: TRANSESOPHAGEAL ECHOCARDIOGRAM (TEE);  Surgeon: [name], MD;  Location: [facility];  Service: Cardiovascular;  Laterality: N/A;   TOOTH EXTRACTION N/A 06/26/2022   Procedure: DENTAL RESTORATION/EXTRACTIONS;  Surgeon: Charlaine Dalton, DMD;  Location: [facility];  Service: Dentistry;  Laterality: N/A;   TUBAL LIGATION     Current Outpatient Medications  Medication Sig Dispense Refill   acetaminophen (TYLENOL) 500 MG tablet Take 500 mg by mouth every 8 (eight) hours as needed for moderate pain.     amLODipine-olmesartan (AZOR) 5-20 MG tablet Take 1 tablet by mouth daily.     aspirin EC 81 MG tablet Take 81 mg by mouth 2 (two) times a week. Swallow whole.     carvedilol (COREG) 3.125 MG tablet Take 1 tablet (3.125 mg total) by mouth 2 (two) times daily. (Patient taking differently: Take 3.125 mg by mouth 2 (two) times daily with a meal.) 180 tablet 3   cyanocobalamin (VITAMIN B12) 1000 MCG tablet Take 1,000 mcg by mouth daily.     furosemide (LASIX) 20 MG  tablet Take 1 tablet (20 mg total) by mouth daily. 90 tablet 3   gabapentin (NEURONTIN) 100 MG capsule Take 100 mg by mouth in the morning and at bedtime.     losartan (COZAAR) 25 MG tablet Take 1 tablet by mouth once daily 30 tablet 7   Menthol, Topical Analgesic, (ICY HOT EX) Apply 1 Application topically daily as needed (pain).     metroNIDAZOLE (METROGEL) 0.75 % vaginal gel Place 1 Applicatorful vaginally at bedtime.     Multiple Vitamins-Minerals (WOMENS MULTI PO) Take 1 tablet by mouth 4 (four) times a week.     potassium chloride (KLOR-CON) 10 MEQ tablet Take 1 tablet (10 mEq total) by mouth daily. 90 tablet 3   No current facility-administered medications for this visit.   Allergies  Allergen Reactions   Ibuprofen Other (See Comments)    Irritates stomach    LABS: Lab Results  Component Value Date   WBC 7.0 06/26/2022   HGB 14.1 06/26/2022   HCT 42.8 06/26/2022  MCV 91.5 06/26/2022   PLT 228 06/26/2022   BMET    Component Value Date/Time   NA 138 06/26/2022 1425   NA 143 05/05/2022 0939   K 2.9 (L) 06/26/2022 1425   CL 103 06/26/2022 1425   CO2 24 06/26/2022 1425   GLUCOSE 113 (H) 06/26/2022 1425   BUN 7 06/26/2022 1425   BUN 7 05/05/2022 0939   CREATININE 0.74 06/26/2022 1425   CREATININE 1.06 04/25/2016 1418   CALCIUM 8.9 06/26/2022 1425   EGFR 99 05/05/2022 0939   GFRNONAA >60 06/26/2022 1425    Lab Results  Component Value Date   INR 0.9 12/24/2018   INR 1.01 04/10/2016   No results found for: "PTT"   VITALS: BP (!) 170/89 (BP Location: Right Arm, Patient Position: Sitting, Cuff Size: Normal)   Pulse 98   Temp 98.3 F (36.8 C) (Oral)   LMP  (LMP Unknown)    EXAM: Extraction sites appear to be healing WNL.  No signs of wound dehiscence or infection evident upon examination. Sutures remain in-tact on the lower right posterior extraction site.   ASSESSMENT:   Postoperative course is consistent with dental procedures  performed.   PROCEDURES: Suture removal.  The patient was given a chlorhexidine gluconate rinse for 30 seconds.  Sutures removed without complication.   PLAN AND RECOMMENDATIONS: Establish care at an outside dental office for routine dental care including replacement of missing teeth as needed, cleanings/periodontal therapy and exams.  The patient would like to return to our hospital clinic following medical optimization and clearance for routine dental care by her medical team for comprehensive dental treatment (restorative, dentures, etc). The patient is to call once she has recovered from surgery and is aware she will require new antibiotic prophylaxis prior to any invasive dental procedures. Recommend that the patient discuss plans to return to the dentist for non-urgent treatment with their medical team to ensure they are medically optimized and there are no contraindications. Follow-up as needed.  Call if any questions or concerns arise.  All questions and concerns were invited and addressed.  The patient tolerated today's visit well and departed in stable condition.  -Sandi Mariscal, DMD

## 2022-07-04 ENCOUNTER — Ambulatory Visit (INDEPENDENT_AMBULATORY_CARE_PROVIDER_SITE_OTHER): Payer: Commercial Managed Care - HMO | Admitting: Thoracic Surgery (Cardiothoracic Vascular Surgery)

## 2022-07-04 ENCOUNTER — Encounter: Payer: Self-pay | Admitting: Thoracic Surgery (Cardiothoracic Vascular Surgery)

## 2022-07-04 VITALS — BP 165/78 | HR 95 | Resp 20 | Ht 65.0 in | Wt 140.0 lb

## 2022-07-04 DIAGNOSIS — I351 Nonrheumatic aortic (valve) insufficiency: Secondary | ICD-10-CM

## 2022-07-04 NOTE — Progress Notes (Signed)
Signed     Expand All Collapse All        [address]       [place] [address]             [phone]                                        Rachael [facility] Record #[id number] Date of Birth: [birth year]   Referring: [name], * Primary Care: [name], MD Primary Cardiologist:[name], MD   Chief Complaint:        Chief Complaint  Patient presents with   Aortic Insuffiency      Surgical consult, TEE and Cardiac Cath 05/12/22      History of Present Illness:     56 year old female presents for surgical evaluation of severe aortic valve insufficiency.  She was also previously seen for a left upper lobe biopsy-proven non-small cell lung cancer.  She continues to smoke and admits to exertional shortness of breath.  She remains very hesitant to proceed with any surgical procedures.     Past Medical and Surgical History: Previous Chest Surgery: No Previous Chest Radiation: No Diabetes Mellitus: no.  HbA1C 5.3 Creatinine: 0.72       Past Medical History:  Diagnosis Date   Arthritis     Back pain     Cardiomyopathy ([place])      a. 04/08/2016 Echo: EF 35%, diff HK, mild LVH, Gr2 DD, mod AI, mod to sev MR, mildly dil LA.   DEPRESSIVE DISORDER NOT ELSEWHERE CLASSIFIED 12/14/2008    no current problem per patient on 04/14/22.  Qualifier: Diagnosis of  By: [name], [name]   DYSFUNCTIONAL UTERINE BLEEDING 11/12/2007    Qualifier: Diagnosis of  By: [name], [name]     Dyspnea     ETOH abuse     Hypertension     Hypertensive heart disease with heart failure ([place])     Marijuana abuse      Last use 04/14/22   MENORRHAGIA 12/14/2008    Qualifier: Diagnosis of  By: [name], [name]     Moderate aortic insufficiency      a. 04/08/2016 Echo: mod AI.   Moderate to Severe Mitral Regurgitation      a. 04/08/2016 Echo: mod-sev MR directed centrally.   Tobacco abuse             Past Surgical History:  Procedure  Laterality Date   BRONCHIAL BIOPSY   04/15/2022    Procedure: BRONCHIAL BIOPSIES;  Surgeon: [name], DO;  Location: [facility];  Service: Pulmonary;;   BRONCHIAL NEEDLE ASPIRATION BIOPSY   04/15/2022    Procedure: BRONCHIAL NEEDLE ASPIRATION BIOPSIES;  Surgeon: [name], DO;  Location: [facility];  Service: Pulmonary;;   BUBBLE STUDY   05/12/2022    Procedure: BUBBLE STUDY;  Surgeon: [name], MD;  Location: [facility];  Service: Cardiovascular;;   CARDIAC CATHETERIZATION N/A 04/10/2016    Procedure: Right/Left Heart Cath and Coronary Angiography;  Surgeon: [name], MD;  Location: [facility];  Service: Cardiovascular;  Laterality: N/A;   EXPLORATORY LAPAROTOMY   2003    Ectopic Pregnancy - unsure which side or if tube was removed   RIGHT/LEFT HEART CATH AND CORONARY ANGIOGRAPHY N/A 05/12/2022    Procedure: RIGHT/LEFT HEART CATH AND  CORONARY ANGIOGRAPHY;  Surgeon: [name], MD;  Location: [facility];  Service: Cardiovascular;  Laterality: N/A;   TEE WITHOUT CARDIOVERSION N/A 06/26/2017    Procedure: TRANSESOPHAGEAL ECHOCARDIOGRAM (TEE);  Surgeon: [name], MD;  Location: [facility];  Service: Cardiovascular;  Laterality: N/A;   TEE WITHOUT CARDIOVERSION N/A 05/12/2022    Procedure: TRANSESOPHAGEAL ECHOCARDIOGRAM (TEE);  Surgeon: [name], MD;  Location: [facility];  Service: Cardiovascular;  Laterality: N/A;   TUBAL LIGATION          Social History: Support: Homeless.  Comes today with her 2 sisters.   Social History        Tobacco Use  Smoking Status Every Day   Packs/day: 1.00   Years: 34.00   Total pack years: 34.00   Types: Cigarettes  Smokeless Tobacco Never  Tobacco Comments    4 packs a cigarettes a week. 03/31/2022 [place].    Social History        Substance and Sexual Activity  Alcohol Use Yes   Alcohol/week: 4.0 standard drinks of alcohol   Types: 4 Standard drinks or equivalent  per week    Comment: At least 12 ounces of homemade moon shine every other day.             Allergies  Allergen Reactions   Ibuprofen Other (See Comments)      Irritates stomach                Current Outpatient Medications  Medication Sig Dispense Refill   acetaminophen (TYLENOL) 500 MG tablet Take 500 mg by mouth every 8 (eight) hours as needed for moderate pain.       amLODipine-olmesartan (AZOR) 5-20 MG tablet Take 1 tablet by mouth daily.       carvedilol (COREG) 3.125 MG tablet Take 1 tablet (3.125 mg total) by mouth 2 (two) times daily. (Patient taking differently: Take 3.125 mg by mouth daily.) 180 tablet 3   cyanocobalamin (VITAMIN B12) 1000 MCG tablet Take 1,000 mcg by mouth daily.       furosemide (LASIX) 20 MG tablet Take 1 tablet (20 mg total) by mouth daily. 90 tablet 3   losartan (COZAAR) 25 MG tablet Take 1 tablet by mouth once daily 30 tablet 7   Multiple Vitamins-Minerals (WOMENS MULTI PO) Take 1 tablet by mouth daily.       potassium chloride (KLOR-CON) 10 MEQ tablet Take 1 tablet (10 mEq total) by mouth daily. 90 tablet 3   gabapentin (NEURONTIN) 100 MG capsule Take 100 mg by mouth 3 (three) times daily as needed (pain).       Menthol, Topical Analgesic, (ICY HOT EX) Apply 1 Application topically daily as needed (pain).        No current facility-administered medications for this visit.      (Not in a hospital admission)          Family History  Problem Relation Age of Onset   CAD Mother          First MI @ 102 - 42 total.   Cirrhosis Father          alcoholic - died in his 58'N.   Lupus Sister     Colon cancer Neg Hx     Colon polyps Neg Hx          Review of Systems:    Review of Systems  Constitutional: Negative.   Respiratory:  Positive for shortness of breath.   Cardiovascular:  Negative for chest pain.  Neurological: Negative.                    Physical Exam: BP (!) 165/79 (BP Location: Right Arm, Patient Position: Sitting)   Pulse  96   Resp 20   Ht 5\' 5"  (1.651 m)   Wt 132 lb (59.9 kg)   LMP  (LMP Unknown)   SpO2 98% Comment: RA  BMI 21.97 kg/m  Physical Exam Constitutional:      General: She is not in acute distress.    Appearance: She is normal weight. She is not ill-appearing.  Eyes:     Extraocular Movements: Extraocular movements intact.     Pupils: Pupils are equal, round, and reactive to light.  Cardiovascular:     Rate and Rhythm: Normal rate.     Pulses: Normal pulses.     Heart sounds: Murmur heard.  Pulmonary:     Effort: Pulmonary effort is normal. No respiratory distress.  Abdominal:     General: Abdomen is flat. There is no distension.  Musculoskeletal:        General: Normal range of motion.     Cervical back: Normal range of motion.  Skin:    General: Skin is warm and dry.  Neurological:     General: No focal deficit present.     Mental Status: She is alert and oriented to person, place, and time.          Diagnostic Studies & Laboratory data:    Left Heart Catherization:  Intervention   Echo: IMPRESSIONS     1. Tricuspid aortic valve with severe AI. Leaflet tips are thickened and  retracted with incomplete leaflet coaptation creating a central  regurgitant jet. Tachycardia was present which can affect quantitation. 2D  ERO by PISA 0.46 cm2, R vol 61 cc. 2D VC  0.52. Holodiastolic flow reversal is present in the descending aorta. LVEF  reduced 40-45%. Mildly dilated LV cavity. All findings consistent with  severe AI. The aortic valve is tricuspid. There is mild thickening of the  aortic valve. Aortic valve  regurgitation is severe. No aortic stenosis is present.   2. Left ventricular ejection fraction, by estimation, is 40 to 45%. The  left ventricle has mildly decreased function. The left ventricle  demonstrates global hypokinesis. The left ventricular internal cavity size  was mildly dilated.   3. Right ventricular systolic function is normal. The right ventricular   size is normal. Tricuspid regurgitation signal is inadequate for assessing  PA pressure.   4. No left atrial/left atrial appendage thrombus was detected.   5. The mitral valve is grossly normal. Mild mitral valve regurgitation.  No evidence of mitral stenosis.   6. Agitated saline contrast bubble study was negative, with no evidence  of any interatrial shunt.  EKG: sinus I have independently reviewed the above radiologic studies and discussed with the patient    Recent Lab Findings: Recent Labs       Lab Results  Component Value Date    WBC 5.7 05/05/2022    HGB 12.9 05/12/2022    HCT 38.0 05/12/2022    PLT 215 05/05/2022    GLUCOSE 82 05/05/2022    CHOL 162 04/25/2019    TRIG 133 04/25/2019    HDL 52 04/25/2019    LDLCALC 87 04/25/2019    ALT 28 04/15/2022    AST 48 (H) 04/15/2022    NA 142 05/12/2022    K 3.2 (L) 05/12/2022  CL 104 05/05/2022    CREATININE 0.72 05/05/2022    BUN 7 05/05/2022    CO2 26 05/05/2022    TSH 0.211 (L) 05/13/2021    INR 0.9 12/24/2018    HGBA1C 5.3 01/10/2019      IMPRESSION: 1. Lobulated lingular nodule measuring 1.5 x 1.1 cm, new from 2021 exam. This nodule abuts the left heart border and is suspicious for primary bronchogenic malignancy. Recommend PET-CT characterization. 2. Bilobed nodule versus 2 adjacent nodules in the left upper lobe posterior apex measuring up to 13 x 6 mm, unchanged in size from prior exam, and only minimally increased in size from 2017 exam. Favor benign process. 3. Sub solid 3 mm right upper lobe nodule, nonspecific. 4. Moderate emphysema. 5. Coronary artery calcifications. 6. Hepatic steatosis.     Assessment / Plan:   56 year old female with severe aortic valve regurgitation with evidence of heart failure, and biopsy-proven left upper lobe non-small cell lung cancer.  She continues to smoke however given the degree of her aortic valve insufficiency her lung cancer will be secondary to this.   Recommended that she undergo an aortic valve replacement.  She would like to have a bioprosthetic valve and not be on Coumadin.  She has had her dental extractions.   In regards to her lung cancer given that she will require open heart surgery will be prudent for her to be treated with SBRT so that we do not delay her care any further.  She was originally diagnosed in August 2023.  She recently had a biopsy last month but has not made any significant progress in regards to smoking cessation.         I  spent 40 minutes counseling the patient face to face.     [name] 05/30/2022 3:28 PM                    Electronically signed by [name], MD at 05/30/2022  3:33 PM

## 2022-07-04 NOTE — Progress Notes (Signed)
Surgical Instructions    Your procedure is scheduled on Wednesday, December 27th, 2023.   Report to [facility] Main Entrance "A" at 05:30 A.M., then check in with the Admitting office.  Call this number if you have problems the morning of surgery:  [phone]   If you have any questions prior to your surgery date call [phone]: Open Monday-Friday 8am-4pm If you experience any cold or flu symptoms such as cough, fever, chills, shortness of breath, etc. between now and your scheduled surgery, please notify us at the above number     Remember:  Do not eat or drink after midnight the night before your surgery    Take these medicines the morning of surgery with A SIP OF WATER:   carvedilol (COREG)  gabapentin (NEURONTIN)   If needed:  acetaminophen (TYLENOL)   Follow your surgeon's instructions on when to stop Aspirin.  If no instructions were given by your surgeon then you will need to call the office to get those instructions.     As of today, STOP taking any Aspirin (unless otherwise instructed by your surgeon) Aleve, Naproxen, Ibuprofen, Motrin, Advil, Goody's, BC's, all herbal medications, fish oil, and all vitamins.    The day of surgery:          Do not wear jewelry or makeup. Do not wear lotions, powders, perfumes or deodorant. Do not shave 48 hours prior to surgery.   Do not bring valuables to the hospital. Do not wear nail polish, gel polish, artificial nails, or any other type of covering on natural nails (fingers and toes) If you have artificial nails or gel coating that need to be removed by a nail salon, please have this removed prior to surgery. Artificial nails or gel coating may interfere with anesthesia's ability to adequately monitor your vital signs.  [facility] is not responsible for any belongings or valuables.    Do NOT Smoke (Tobacco/Vaping)  24 hours prior to your procedure  If you use a CPAP at night, you may bring your mask for your overnight  stay.   Contacts, glasses, hearing aids, dentures or partials may not be worn into surgery, please bring cases for these belongings   For patients admitted to the hospital, discharge time will be determined by your treatment team.   Patients discharged the day of surgery will not be allowed to drive home, and someone needs to stay with them for 24 hours.   SURGICAL WAITING ROOM VISITATION Patients having surgery or a procedure may have no more than 2 support people in the waiting area - these visitors may rotate.   Children under the age of 90 must have an adult with them who is not the patient. If the patient needs to stay at the hospital during part of their recovery, the visitor guidelines for inpatient rooms apply. Pre-op nurse will coordinate an appropriate time for 1 support person to accompany patient in pre-op.  This support person may not rotate.   Please refer to [email or web address] for the visitor guidelines for Inpatients (after your surgery is over and you are in a regular room).    Special instructions:    Oral Hygiene is also important to reduce your risk of infection.  Remember - BRUSH YOUR TEETH THE MORNING OF SURGERY WITH YOUR REGULAR TOOTHPASTE   [facility]- Preparing For Surgery  Before surgery, you can play an important role. Because skin is not sterile, your skin needs to be as free of germs as possible.  You can reduce the number of germs on your skin by washing with CHG (chlorahexidine gluconate) Soap before surgery.  CHG is an antiseptic cleaner which kills germs and bonds with the skin to continue killing germs even after washing.     Please do not use if you have an allergy to CHG or antibacterial soaps. If your skin becomes reddened/irritated stop using the CHG.  Do not shave (including legs and underarms) for at least 48 hours prior to first CHG shower. It is OK to shave your face.  Please follow these  instructions carefully.     Shower the NIGHT BEFORE SURGERY and the MORNING OF SURGERY with CHG Soap.   If you chose to wash your hair, wash your hair first as usual with your normal shampoo. After you shampoo, rinse your hair and body thoroughly to remove the shampoo.  Then [employer] and genitals (private parts) with your normal soap and rinse thoroughly to remove soap.  After that Use CHG Soap as you would any other liquid soap. You can apply CHG directly to the skin and wash gently with a scrungie or a clean washcloth.   Apply the CHG Soap to your body ONLY FROM THE NECK DOWN.  Do not use on open wounds or open sores. Avoid contact with your eyes, ears, mouth and genitals (private parts). Wash Face and genitals (private parts)  with your normal soap.   Wash thoroughly, paying special attention to the area where your surgery will be performed.  Thoroughly rinse your body with warm water from the neck down.  DO NOT shower/wash with your normal soap after using and rinsing off the CHG Soap.  Pat yourself dry with a CLEAN TOWEL.  Wear CLEAN PAJAMAS to bed the night before surgery  Place CLEAN SHEETS on your bed the night before your surgery  DO NOT SLEEP WITH PETS.   Day of Surgery:  Take a shower with CHG soap. Wear Clean/Comfortable clothing the morning of surgery Do not apply any deodorants/lotions.   Remember to brush your teeth WITH YOUR REGULAR TOOTHPASTE.    If you received a COVID test during your pre-op visit, it is requested that you wear a mask when out in public, stay away from anyone that may not be feeling well, and notify your surgeon if you develop symptoms. If you have been in contact with anyone that has tested positive in the last 10 days, please notify your surgeon.    Please read over the following fact sheets that you were given.

## 2022-07-04 NOTE — H&P (View-Only) (Signed)
Signed     Expand All Collapse All        [name]       [name] [address]             [phone]                                        Rachael Stone [facility] Medical Record #[id number] Date of Birth: 25-May-1966   Referring: [name], * Primary Care: [name], MD Primary Cardiologist:[name], MD   Chief Complaint:        Chief Complaint  Patient presents with   Aortic Insuffiency      Surgical consult, TEE and Cardiac Cath 05/12/22      History of Present Illness:     56 year old female presents for surgical evaluation of severe aortic valve insufficiency.  She was also previously seen for a left upper lobe biopsy-proven non-small cell lung cancer.  She continues to smoke and admits to exertional shortness of breath.  She remains very hesitant to proceed with any surgical procedures.     Past Medical and Surgical History: Previous Chest Surgery: No Previous Chest Radiation: No Diabetes Mellitus: no.  HbA1C 5.3 Creatinine: 0.72       Past Medical History:  Diagnosis Date   Arthritis     Back pain     Cardiomyopathy ([place])      a. 04/08/2016 Echo: EF 35%, diff HK, mild LVH, Gr2 DD, mod AI, mod to sev MR, mildly dil LA.   DEPRESSIVE DISORDER NOT ELSEWHERE CLASSIFIED 12/14/2008    no current problem per patient on 04/14/22.  Qualifier: Diagnosis of  By: [name], [name]   DYSFUNCTIONAL UTERINE BLEEDING 11/12/2007    Qualifier: Diagnosis of  By: [name], [name]     Dyspnea     ETOH abuse     Hypertension     Hypertensive heart disease with heart failure ([place])     Marijuana abuse      Last use 04/14/22   MENORRHAGIA 12/14/2008    Qualifier: Diagnosis of  By: [name], [name]     Moderate aortic insufficiency      a. 04/08/2016 Echo: mod AI.   Moderate to Severe Mitral Regurgitation      a. 04/08/2016 Echo: mod-sev MR directed centrally.   Tobacco abuse             Past Surgical History:  Procedure  Laterality Date   BRONCHIAL BIOPSY   04/15/2022    Procedure: BRONCHIAL BIOPSIES;  Surgeon: [name], DO;  Location: [facility];  Service: Pulmonary;;   BRONCHIAL NEEDLE ASPIRATION BIOPSY   04/15/2022    Procedure: BRONCHIAL NEEDLE ASPIRATION BIOPSIES;  Surgeon: [name], DO;  Location: [facility];  Service: Pulmonary;;   BUBBLE STUDY   05/12/2022    Procedure: BUBBLE STUDY;  Surgeon: [name], MD;  Location: [facility];  Service: Cardiovascular;;   CARDIAC CATHETERIZATION N/A 04/10/2016    Procedure: Right/Left Heart Cath and Coronary Angiography;  Surgeon: [name], MD;  Location: [facility];  Service: Cardiovascular;  Laterality: N/A;   EXPLORATORY LAPAROTOMY   2003    Ectopic Pregnancy - unsure which side or if tube was removed   RIGHT/LEFT HEART CATH AND CORONARY ANGIOGRAPHY N/A 05/12/2022    Procedure: RIGHT/LEFT HEART CATH AND  CORONARY ANGIOGRAPHY;  Surgeon: [name], MD;  Location: [facility];  Service: Cardiovascular;  Laterality: N/A;   TEE WITHOUT CARDIOVERSION N/A 06/26/2017    Procedure: TRANSESOPHAGEAL ECHOCARDIOGRAM (TEE);  Surgeon: [name], MD;  Location: [facility];  Service: Cardiovascular;  Laterality: N/A;   TEE WITHOUT CARDIOVERSION N/A 05/12/2022    Procedure: TRANSESOPHAGEAL ECHOCARDIOGRAM (TEE);  Surgeon: [name], MD;  Location: [facility];  Service: Cardiovascular;  Laterality: N/A;   TUBAL LIGATION          Social History: Support: Homeless.  Comes today with her 2 sisters.   Social History        Tobacco Use  Smoking Status Every Day   Packs/day: 1.00   Years: 34.00   Total pack years: 34.00   Types: Cigarettes  Smokeless Tobacco Never  Tobacco Comments    4 packs a cigarettes a week. 03/31/2022 [place].    Social History        Substance and Sexual Activity  Alcohol Use Yes   Alcohol/week: 4.0 standard drinks of alcohol   Types: 4 Standard drinks or equivalent  per week    Comment: At least 12 ounces of homemade moon shine every other day.             Allergies  Allergen Reactions   Ibuprofen Other (See Comments)      Irritates stomach                Current Outpatient Medications  Medication Sig Dispense Refill   acetaminophen (TYLENOL) 500 MG tablet Take 500 mg by mouth every 8 (eight) hours as needed for moderate pain.       amLODipine-olmesartan (AZOR) 5-20 MG tablet Take 1 tablet by mouth daily.       carvedilol (COREG) 3.125 MG tablet Take 1 tablet (3.125 mg total) by mouth 2 (two) times daily. (Patient taking differently: Take 3.125 mg by mouth daily.) 180 tablet 3   cyanocobalamin (VITAMIN B12) 1000 MCG tablet Take 1,000 mcg by mouth daily.       furosemide (LASIX) 20 MG tablet Take 1 tablet (20 mg total) by mouth daily. 90 tablet 3   losartan (COZAAR) 25 MG tablet Take 1 tablet by mouth once daily 30 tablet 7   Multiple Vitamins-Minerals (WOMENS MULTI PO) Take 1 tablet by mouth daily.       potassium chloride (KLOR-CON) 10 MEQ tablet Take 1 tablet (10 mEq total) by mouth daily. 90 tablet 3   gabapentin (NEURONTIN) 100 MG capsule Take 100 mg by mouth 3 (three) times daily as needed (pain).       Menthol, Topical Analgesic, (ICY HOT EX) Apply 1 Application topically daily as needed (pain).        No current facility-administered medications for this visit.      (Not in a hospital admission)          Family History  Problem Relation Age of Onset   CAD Mother          First MI @ 59 - 64 total.   Cirrhosis Father          alcoholic - died in his 42'H.   Lupus Sister     Colon cancer Neg Hx     Colon polyps Neg Hx          Review of Systems:    Review of Systems  Constitutional: Negative.   Respiratory:  Positive for shortness of breath.   Cardiovascular:  Negative for chest pain.  Neurological: Negative.                    Physical Exam: BP (!) 165/79 (BP Location: Right Arm, Patient Position: Sitting)   Pulse  96   Resp 20   Ht 5\' 5"  (1.651 m)   Wt 132 lb (59.9 kg)   LMP  (LMP Unknown)   SpO2 98% Comment: RA  BMI 21.97 kg/m  Physical Exam Constitutional:      General: She is not in acute distress.    Appearance: She is normal weight. She is not ill-appearing.  Eyes:     Extraocular Movements: Extraocular movements intact.     Pupils: Pupils are equal, round, and reactive to light.  Cardiovascular:     Rate and Rhythm: Normal rate.     Pulses: Normal pulses.     Heart sounds: Murmur heard.  Pulmonary:     Effort: Pulmonary effort is normal. No respiratory distress.  Abdominal:     General: Abdomen is flat. There is no distension.  Musculoskeletal:        General: Normal range of motion.     Cervical back: Normal range of motion.  Skin:    General: Skin is warm and dry.  Neurological:     General: No focal deficit present.     Mental Status: She is alert and oriented to person, place, and time.          Diagnostic Studies & Laboratory data:    Left Heart Catherization:  Intervention   Echo: IMPRESSIONS     1. Tricuspid aortic valve with severe AI. Leaflet tips are thickened and  retracted with incomplete leaflet coaptation creating a central  regurgitant jet. Tachycardia was present which can affect quantitation. 2D  ERO by PISA 0.46 cm2, R vol 61 cc. 2D VC  0.52. Holodiastolic flow reversal is present in the descending aorta. LVEF  reduced 40-45%. Mildly dilated LV cavity. All findings consistent with  severe AI. The aortic valve is tricuspid. There is mild thickening of the  aortic valve. Aortic valve  regurgitation is severe. No aortic stenosis is present.   2. Left ventricular ejection fraction, by estimation, is 40 to 45%. The  left ventricle has mildly decreased function. The left ventricle  demonstrates global hypokinesis. The left ventricular internal cavity size  was mildly dilated.   3. Right ventricular systolic function is normal. The right ventricular   size is normal. Tricuspid regurgitation signal is inadequate for assessing  PA pressure.   4. No left atrial/left atrial appendage thrombus was detected.   5. The mitral valve is grossly normal. Mild mitral valve regurgitation.  No evidence of mitral stenosis.   6. Agitated saline contrast bubble study was negative, with no evidence  of any interatrial shunt.  EKG: sinus I have independently reviewed the above radiologic studies and discussed with the patient    Recent Lab Findings: Recent Labs       Lab Results  Component Value Date    WBC 5.7 05/05/2022    HGB 12.9 05/12/2022    HCT 38.0 05/12/2022    PLT 215 05/05/2022    GLUCOSE 82 05/05/2022    CHOL 162 04/25/2019    TRIG 133 04/25/2019    HDL 52 04/25/2019    LDLCALC 87 04/25/2019    ALT 28 04/15/2022    AST 48 (H) 04/15/2022    NA 142 05/12/2022    K 3.2 (L) 05/12/2022  CL 104 05/05/2022    CREATININE 0.72 05/05/2022    BUN 7 05/05/2022    CO2 26 05/05/2022    TSH 0.211 (L) 05/13/2021    INR 0.9 12/24/2018    HGBA1C 5.3 01/10/2019      IMPRESSION: 1. Lobulated lingular nodule measuring 1.5 x 1.1 cm, new from 2021 exam. This nodule abuts the left heart border and is suspicious for primary bronchogenic malignancy. Recommend PET-CT characterization. 2. Bilobed nodule versus 2 adjacent nodules in the left upper lobe posterior apex measuring up to 13 x 6 mm, unchanged in size from prior exam, and only minimally increased in size from 2017 exam. Favor benign process. 3. Sub solid 3 mm right upper lobe nodule, nonspecific. 4. Moderate emphysema. 5. Coronary artery calcifications. 6. Hepatic steatosis.     Assessment / Plan:   56 year old female with severe aortic valve regurgitation with evidence of heart failure, and biopsy-proven left upper lobe non-small cell lung cancer.  She continues to smoke however given the degree of her aortic valve insufficiency her lung cancer will be secondary to this.   Recommended that she undergo an aortic valve replacement.  She would like to have a bioprosthetic valve and not be on Coumadin.  She has had her dental extractions.   In regards to her lung cancer given that she will require open heart surgery will be prudent for her to be treated with SBRT so that we do not delay her care any further.  She was originally diagnosed in August 2023.  She recently had a biopsy last month but has not made any significant progress in regards to smoking cessation.         I  spent 40 minutes counseling the patient face to face.     [name] 05/30/2022 3:28 PM                    Electronically signed by [name], MD at 05/30/2022  3:33 PM

## 2022-07-08 ENCOUNTER — Encounter (HOSPITAL_COMMUNITY)
Admit: 2022-07-08 | Discharge: 2022-07-08 | Disposition: A | Discharge status: Home / Self Care | Payer: Commercial Managed Care - HMO | Attending: Thoracic Surgery (Cardiothoracic Vascular Surgery) | Admitting: Thoracic Surgery (Cardiothoracic Vascular Surgery)

## 2022-07-08 ENCOUNTER — Ambulatory Visit (HOSPITAL_COMMUNITY)
Admission: RE | Admit: 2022-07-08 | Discharge: 2022-07-08 | Disposition: A | Discharge status: Home / Self Care | Payer: Commercial Managed Care - HMO | Source: Ambulatory Visit | Attending: Thoracic Surgery (Cardiothoracic Vascular Surgery) | Admitting: Thoracic Surgery (Cardiothoracic Vascular Surgery)

## 2022-07-08 ENCOUNTER — Encounter (HOSPITAL_COMMUNITY): Payer: Self-pay

## 2022-07-08 ENCOUNTER — Other Ambulatory Visit: Payer: Self-pay

## 2022-07-08 ENCOUNTER — Ambulatory Visit (HOSPITAL_BASED_OUTPATIENT_CLINIC_OR_DEPARTMENT_OTHER)
Admit: 2022-07-08 | Discharge: 2022-07-08 | Disposition: A | Discharge status: Home / Self Care | Payer: Commercial Managed Care - HMO | Attending: Thoracic Surgery (Cardiothoracic Vascular Surgery) | Admitting: Thoracic Surgery (Cardiothoracic Vascular Surgery)

## 2022-07-08 VITALS — BP 167/85 | HR 96 | Temp 97.6°F | Resp 18 | Ht 65.0 in | Wt 146.5 lb

## 2022-07-08 DIAGNOSIS — Z1152 Encounter for screening for COVID-19: Secondary | ICD-10-CM | POA: Insufficient documentation

## 2022-07-08 DIAGNOSIS — I351 Nonrheumatic aortic (valve) insufficiency: Secondary | ICD-10-CM | POA: Insufficient documentation

## 2022-07-08 DIAGNOSIS — Z01818 Encounter for other preprocedural examination: Secondary | ICD-10-CM | POA: Insufficient documentation

## 2022-07-08 LAB — CBC
HCT: 37.1 % (ref 36.0–46.0)
Hemoglobin: 11.8 g/dL — ABNORMAL LOW (ref 12.0–15.0)
MCH: 29.6 pg (ref 26.0–34.0)
MCHC: 31.8 g/dL (ref 30.0–36.0)
MCV: 93 fL (ref 80.0–100.0)
Platelets: 200 10*3/uL (ref 150–400)
RBC: 3.99 MIL/uL (ref 3.87–5.11)
RDW: 14.3 % (ref 11.5–15.5)
WBC: 5.9 10*3/uL (ref 4.0–10.5)
nRBC: 0 % (ref 0.0–0.2)

## 2022-07-08 LAB — URINALYSIS, ROUTINE W REFLEX MICROSCOPIC
Bilirubin Urine: NEGATIVE
Glucose, UA: NEGATIVE mg/dL
Hgb urine dipstick: NEGATIVE
Ketones, ur: NEGATIVE mg/dL
Leukocytes,Ua: NEGATIVE
Nitrite: NEGATIVE
Protein, ur: NEGATIVE mg/dL
Specific Gravity, Urine: 1.008 (ref 1.005–1.030)
pH: 5 (ref 5.0–8.0)

## 2022-07-08 LAB — COMPREHENSIVE METABOLIC PANEL
ALT: 26 U/L (ref 0–44)
AST: 65 U/L — ABNORMAL HIGH (ref 15–41)
Albumin: 3.6 g/dL (ref 3.5–5.0)
Alkaline Phosphatase: 61 U/L (ref 38–126)
Anion gap: 10 (ref 5–15)
BUN: <5 mg/dL — ABNORMAL LOW (ref 6–20)
CO2: 20 mmol/L — ABNORMAL LOW (ref 22–32)
Calcium: 8.7 mg/dL — ABNORMAL LOW (ref 8.9–10.3)
Chloride: 111 mmol/L (ref 98–111)
Creatinine, Ser: 0.65 mg/dL (ref 0.44–1.00)
GFR, Estimated: >60 mL/min (ref >60)
Glucose, Bld: 100 mg/dL — ABNORMAL HIGH (ref 70–99)
Potassium: 3.7 mmol/L (ref 3.5–5.1)
Sodium: 141 mmol/L (ref 135–145)
Total Bilirubin: 0.6 mg/dL (ref 0.3–1.2)
Total Protein: 7.3 g/dL (ref 6.5–8.1)

## 2022-07-08 LAB — TYPE AND SCREEN
ABO/RH(D): AB POS
Antibody Screen: NEGATIVE

## 2022-07-08 LAB — BLOOD GAS, ARTERIAL
Acid-Base Excess: 0.8 mmol/L (ref 0.0–2.0)
Bicarbonate: 24.2 mmol/L (ref 20.0–28.0)
Drawn by: 58793
O2 Saturation: 99.2 %
Patient temperature: 37
pCO2 arterial: 34 mmHg (ref 32–48)
pH, Arterial: 7.46 — ABNORMAL HIGH (ref 7.35–7.45)
pO2, Arterial: 112 mmHg — ABNORMAL HIGH (ref 83–108)

## 2022-07-08 LAB — APTT: aPTT: 27 seconds (ref 24–36)

## 2022-07-08 LAB — HEMOGLOBIN A1C
Hgb A1c MFr Bld: 5.5 % (ref 4.8–5.6)
Mean Plasma Glucose: 111 mg/dL

## 2022-07-08 LAB — PROTIME-INR
INR: 1.1 (ref 0.8–1.2)
Prothrombin Time: 13.8 seconds (ref 11.4–15.2)

## 2022-07-08 LAB — SURGICAL PCR SCREEN
MRSA, PCR: NEGATIVE
Staphylococcus aureus: NEGATIVE

## 2022-07-08 LAB — SARS CORONAVIRUS 2 BY RT PCR: SARS Coronavirus 2 by RT PCR: NEGATIVE

## 2022-07-08 MED ORDER — MANNITOL 20 % IV SOLN
INTRAVENOUS | Status: DC
  Filled 2022-07-08: qty 13

## 2022-07-08 MED ORDER — TRANEXAMIC ACID (OHS) BOLUS VIA INFUSION
15.0000 mg/kg | INTRAVENOUS | Status: AC
  Administered 2022-07-09: 997.5 mg via INTRAVENOUS
  Filled 2022-07-08: qty 998

## 2022-07-08 MED ORDER — PHENYLEPHRINE HCL-NACL 20-0.9 MG/250ML-% IV SOLN
30.0000 ug/min | INTRAVENOUS | Status: AC
  Administered 2022-07-09: 20 ug/min via INTRAVENOUS
  Filled 2022-07-08: qty 250

## 2022-07-08 MED ORDER — EPINEPHRINE HCL 5 MG/250ML IV SOLN IN NS
0.0000 ug/min | INTRAVENOUS | Status: DC
  Filled 2022-07-08: qty 250

## 2022-07-08 MED ORDER — TRANEXAMIC ACID 1000 MG/10ML IV SOLN
1.5000 mg/kg/h | INTRAVENOUS | Status: AC
  Administered 2022-07-09: 1.5 mg/kg/h via INTRAVENOUS
  Filled 2022-07-08: qty 25

## 2022-07-08 MED ORDER — PLASMA-LYTE A IV SOLN
INTRAVENOUS | Status: DC
  Filled 2022-07-08: qty 2.5

## 2022-07-08 MED ORDER — NOREPINEPHRINE 4 MG/250ML-% IV SOLN
0.0000 ug/min | INTRAVENOUS | Status: DC
  Filled 2022-07-08: qty 250

## 2022-07-08 MED ORDER — INSULIN REGULAR(HUMAN) IN NACL 100-0.9 UT/100ML-% IV SOLN
INTRAVENOUS | Status: AC
  Administered 2022-07-09: 1 [IU]/h via INTRAVENOUS
  Filled 2022-07-08: qty 100

## 2022-07-08 MED ORDER — CEFAZOLIN SODIUM-DEXTROSE 2-4 GM/100ML-% IV SOLN
2.0000 g | INTRAVENOUS | Status: AC
  Administered 2022-07-09: 2 g via INTRAVENOUS
  Filled 2022-07-08: qty 100

## 2022-07-08 MED ORDER — HEPARIN 30,000 UNITS/1000 ML (OHS) CELLSAVER SOLUTION
Status: DC
  Filled 2022-07-08: qty 1000

## 2022-07-08 MED ORDER — POTASSIUM CHLORIDE 2 MEQ/ML IV SOLN
80.0000 meq | INTRAVENOUS | Status: DC
  Filled 2022-07-08: qty 40

## 2022-07-08 MED ORDER — CEFAZOLIN SODIUM-DEXTROSE 2-4 GM/100ML-% IV SOLN
2.0000 g | INTRAVENOUS | Status: DC
  Filled 2022-07-08: qty 100

## 2022-07-08 MED ORDER — VANCOMYCIN HCL 1250 MG/250ML IV SOLN
1250.0000 mg | INTRAVENOUS | Status: AC
  Administered 2022-07-09: 1250 mg via INTRAVENOUS
  Filled 2022-07-08: qty 250

## 2022-07-08 MED ORDER — MILRINONE LACTATE IN DEXTROSE 20-5 MG/100ML-% IV SOLN
0.3000 ug/kg/min | INTRAVENOUS | Status: DC
  Filled 2022-07-08: qty 100

## 2022-07-08 MED ORDER — DEXMEDETOMIDINE HCL IN NACL 400 MCG/100ML IV SOLN
0.1000 ug/kg/h | INTRAVENOUS | Status: AC
  Administered 2022-07-09: .3 ug/kg/h via INTRAVENOUS
  Filled 2022-07-08 (×2): qty 100

## 2022-07-08 MED ORDER — TRANEXAMIC ACID (OHS) PUMP PRIME SOLUTION
2.0000 mg/kg | INTRAVENOUS | Status: DC
  Filled 2022-07-08: qty 1.33

## 2022-07-08 NOTE — Anesthesia Preprocedure Evaluation (Addendum)
Anesthesia Evaluation  Patient identified by MRN, date of birth, ID band Patient awake    Reviewed: Allergy & Precautions, NPO status , Patient's Chart, lab work & pertinent test results, reviewed documented beta blocker date and time   Airway Mallampati: II  TM Distance: >3 FB Neck ROM: Full    Dental  (+) Dental Advisory Given, Edentulous Upper   Pulmonary COPD, Current Smoker and Patient abstained from smoking. LUL lung adenocarcinoma   Pulmonary exam normal breath sounds clear to auscultation       Cardiovascular hypertension, Pt. on home beta blockers and Pt. on medications + Valvular Problems/Murmurs AI  Rhythm:Regular Rate:Normal + Diastolic murmurs Echo 32/67/12: 1. Tricuspid aortic valve with severe AI. Leaflet tips are thickened and  retracted with incomplete leaflet coaptation creating a central  regurgitant jet. Tachycardia was present which can affect quantitation. 2D  ERO by PISA 0.46 cm2, R vol 61 cc. 2D VC  0.52. Holodiastolic flow reversal is present in the descending aorta. LVEF  reduced 40-45%. Mildly dilated LV cavity. All findings consistent with  severe AI. The aortic valve is tricuspid. There is mild thickening of the  aortic valve. Aortic valve  regurgitation is severe. No aortic stenosis is present.   2. Left ventricular ejection fraction, by estimation, is 40 to 45%. The  left ventricle has mildly decreased function. The left ventricle  demonstrates global hypokinesis. The left ventricular internal cavity size  was mildly dilated.   3. Right ventricular systolic function is normal. The right ventricular  size is normal. Tricuspid regurgitation signal is inadequate for assessing  PA pressure.   4. No left atrial/left atrial appendage thrombus was detected.   5. The mitral valve is grossly normal. Mild mitral valve regurgitation.  No evidence of mitral stenosis.   6. Agitated saline contrast bubble study  was negative, with no evidence  of any interatrial shunt.     Neuro/Psych  PSYCHIATRIC DISORDERS Anxiety Depression    negative neurological ROS     GI/Hepatic ,GERD  ,,(+)     substance abuse  alcohol use and marijuana use  Endo/Other  negative endocrine ROS    Renal/GU negative Renal ROS     Musculoskeletal  (+) Arthritis ,    Abdominal   Peds  Hematology  (+) Blood dyscrasia, anemia   Anesthesia Other Findings   Reproductive/Obstetrics                              Anesthesia Physical Anesthesia Plan  ASA: 4  Anesthesia Plan: General   Post-op Pain Management:    Induction: Intravenous  PONV Risk Score and Plan: 2 and Treatment may vary due to age or medical condition and Midazolam  Airway Management Planned: Oral ETT  Additional Equipment: Ultrasound Guidance Line Placement, TEE, PA Cath, CVP and Arterial line  Intra-op Plan:   Post-operative Plan: Post-operative intubation/ventilation  Informed Consent:   Plan Discussed with:   Anesthesia Plan Comments: (PAT note written 07/08/2022 by Myra Gianotti, PA-C.  )        Anesthesia Quick Evaluation

## 2022-07-08 NOTE — Progress Notes (Signed)
Anesthesia follow-up:   Case: [id number] Date/Time: 07/09/22 0715   Procedures:      AORTIC VALVE REPLACEMENT (AVR) (Chest)     TRANSESOPHAGEAL ECHOCARDIOGRAM (TEE)   Anesthesia type: General   Pre-op diagnosis: AI   Location: [facility] OR ROOM 89 / [facility] OR   Surgeons: [name], MD       DISCUSSION:  Patient is a 56 year old female scheduled for the above procedure. She has severe AI and recently diagnosed LUL lung adenocarcinoma. SBRT is planned for treatment of her lung cancer, but she has deferred referral until after her AVR. She is oral gross debridement and extractions of teeth 27, 29, 31 on 06/26/22 in preparation for heart surgery.    See my previous Anesthesia APP note from Date of Service 06/25/22. Anesthesia team to evaluate on the day of surgery. 07/08/22 COVID-19 test was negative. A1c is still in process.    VS: BP (!) 167/85   Pulse 96   Temp 36.4 C   Resp 18   Ht 5\' 5"  (1.651 m)   Wt 66.5 kg   LMP  (LMP Unknown)   SpO2 99%   BMI 24.38 kg/m    PROVIDERS: [name], MD is PCP  Mertie Moores, MD is cardiologist June Leap, DO is pulmonologist Melodie Bouillon, MD is CT surgeon   LABS: Labs reviewed: Acceptable for surgery. A1c is still in process.  (all labs ordered are listed, but only abnormal results are displayed)  Labs Reviewed  CBC - Abnormal; Notable for the following components:      Result Value   Hemoglobin 11.8 (*)    All other components within normal limits  COMPREHENSIVE METABOLIC PANEL - Abnormal; Notable for the following components:   CO2 20 (*)    Glucose, Bld 100 (*)    BUN <5 (*)    Calcium 8.7 (*)    AST 65 (*)    All other components within normal limits  BLOOD GAS, ARTERIAL - Abnormal; Notable for the following components:   pH, Arterial 7.46 (*)    pO2, Arterial 112 (*)    All other components within normal limits  SURGICAL PCR SCREEN  SARS CORONAVIRUS 2 BY RT PCR  PROTIME-INR  APTT  URINALYSIS, ROUTINE  W REFLEX MICROSCOPIC  HEMOGLOBIN A1C  TYPE AND SCREEN    IMAGES: CXR 07/08/22: FINDINGS: The heart size and mediastinal contours are within normal limits. 1.5 cm left lung base nodule is identified unchanged compared to prior chest CT September 2023. There is no focal infiltrate, pulmonary edema, or pleural effusion. The visualized skeletal structures are stable. IMPRESSION: No active cardiopulmonary disease. Left lung base nodule is identified unchanged compared to prior chest CT September 2023. Please see chest CT and PET-CT report for follow up instructions.    EKG: 07/08/22: Normal sinus rhythm Minimal voltage criteria for LVH, may be normal variant ( Cornell product )   CV: Outlined in my previously note. See also Results Review tab.  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 04/08/2016 Echo: EF 35%, diff HK, mild LVH, Gr2 DD, mod AI, mod to sev MR, mildly dil LA.   COPD (chronic obstructive pulmonary disease) ([place])    DEPRESSIVE DISORDER NOT ELSEWHERE CLASSIFIED 12/14/2008   no current problem per patient on 04/14/22.  Qualifier: Diagnosis of  By: [name], Junie Panning  DYSFUNCTIONAL UTERINE BLEEDING 11/12/2007   Qualifier: Diagnosis of  By: [name], [name]     Dyspnea    ETOH abuse    Hypertension    Hypertensive heart disease with heart failure ([place])    Marijuana abuse    Last use 04/14/22   MENORRHAGIA 12/14/2008   Qualifier: Diagnosis of  By: [name], [name]     Moderate aortic insufficiency    a. 04/08/2016 Echo: mod AI.   Moderate to Severe Mitral Regurgitation    a. 04/08/2016 Echo: mod-sev MR directed centrally.   Tobacco abuse     Past Surgical History:  Procedure Laterality Date   BRONCHIAL BIOPSY  04/15/2022   Procedure: BRONCHIAL BIOPSIES;  Surgeon: [name], DO;  Location: [facility];  Service: Pulmonary;;   BRONCHIAL NEEDLE ASPIRATION  BIOPSY  04/15/2022   Procedure: BRONCHIAL NEEDLE ASPIRATION BIOPSIES;  Surgeon: [name], DO;  Location: [facility];  Service: Pulmonary;;   BUBBLE STUDY  05/12/2022   Procedure: BUBBLE STUDY;  Surgeon: [name], MD;  Location: [facility];  Service: Cardiovascular;;   CARDIAC CATHETERIZATION N/A 04/10/2016   Procedure: Right/Left Heart Cath and Coronary Angiography;  Surgeon: [name], MD;  Location: [facility];  Service: Cardiovascular;  Laterality: N/A;   EXPLORATORY LAPAROTOMY  07/14/2001   Ectopic Pregnancy - unsure which side or if tube was removed   RIGHT/LEFT HEART CATH AND CORONARY ANGIOGRAPHY N/A 05/12/2022   Procedure: RIGHT/LEFT HEART CATH AND CORONARY ANGIOGRAPHY;  Surgeon: [name], MD;  Location: [facility];  Service: Cardiovascular;  Laterality: N/A;   TEE WITHOUT CARDIOVERSION N/A 06/26/2017   Procedure: TRANSESOPHAGEAL ECHOCARDIOGRAM (TEE);  Surgeon: [name], MD;  Location: [facility];  Service: Cardiovascular;  Laterality: N/A;   TEE WITHOUT CARDIOVERSION N/A 05/12/2022   Procedure: TRANSESOPHAGEAL ECHOCARDIOGRAM (TEE);  Surgeon: [name], MD;  Location: [facility];  Service: Cardiovascular;  Laterality: N/A;   TONSILLECTOMY     TOOTH EXTRACTION N/A 06/26/2022   Procedure: DENTAL RESTORATION/EXTRACTIONS;  Surgeon: Charlaine Dalton, DMD;  Location: [facility];  Service: Dentistry;  Laterality: N/A;   TUBAL LIGATION      MEDICATIONS:  acetaminophen (TYLENOL) 500 MG tablet   amLODipine-olmesartan (AZOR) 5-20 MG tablet   aspirin EC 81 MG tablet   carvedilol (COREG) 3.125 MG tablet   cyanocobalamin (VITAMIN B12) 1000 MCG tablet   furosemide (LASIX) 20 MG tablet   gabapentin (NEURONTIN) 100 MG capsule   losartan (COZAAR) 25 MG tablet   Menthol, Topical Analgesic, (ICY HOT EX)   metroNIDAZOLE (METROGEL) 0.75 % vaginal gel   Multiple Vitamins-Minerals (WOMENS MULTI PO)   potassium chloride  (KLOR-CON) 10 MEQ tablet   No current facility-administered medications for this encounter.    [START ON 07/09/2022] ceFAZolin (ANCEF) IVPB 2g/100 mL premix   [START ON 07/09/2022] ceFAZolin (ANCEF) IVPB 2g/100 mL premix   [START ON 07/09/2022] dexmedetomidine (PRECEDEX) 400 MCG/100ML (4 mcg/mL) infusion   [START ON 07/09/2022] EPINEPHrine (ADRENALIN) 5 mg in NS 250 mL (0.02 mg/mL) premix infusion   [START ON 07/09/2022] heparin 30,000 units/NS 1000 mL solution for CELLSAVER   [START ON 07/09/2022] heparin sodium (porcine) 2,500 Units, papaverine 30 mg in electrolyte-A (PLASMALYTE-A PH 7.4) 500 mL irrigation   [START ON 07/09/2022] insulin regular, human (MYXREDLIN) 100 units/ 100 mL infusion   [START ON 07/09/2022] [place] Blood Cardioplegia vial (lidocaine/magnesium/mannitol 0.26g-4g-6.4g)   [START ON 07/09/2022] milrinone (PRIMACOR) 20 MG/100 ML (0.2 mg/mL) infusion   [START ON  07/09/2022] norepinephrine (LEVOPHED) 4mg  in 256mL (0.016 mg/mL) premix infusion   [START ON 07/09/2022] phenylephrine (NEO-SYNEPHRINE) 20mg /NS 224mL premix infusion   [START ON 07/09/2022] potassium chloride injection 80 mEq   [START ON 07/09/2022] tranexamic acid (CYKLOKAPRON) 2,500 mg in sodium chloride 0.9 % 250 mL (10 mg/mL) infusion   [START ON 07/09/2022] tranexamic acid (CYKLOKAPRON) bolus via infusion - over 30 minutes 997.5 mg   [START ON 07/09/2022] tranexamic acid (CYKLOKAPRON) pump prime solution 133 mg   [START ON 07/09/2022] vancomycin (VANCOREADY) IVPB 1250 mg/250 mL    Myra Gianotti, PA-C Surgical Short Stay/Anesthesiology [facility] Phone [phone] [facility] Phone [phone] 07/08/2022 1:50 PM

## 2022-07-08 NOTE — Progress Notes (Signed)
PCP - [name], MD Cardiologist - Deeann Saint, MD  PPM/ICD - denies Device Orders - n/a Rep Notified - n/a  Chest x-ray - 07/08/22 EKG - 07/08/22 Stress Test - denies ECHO - 05/12/22 Cardiac Cath - 05/12/22  Sleep Study - denies CPAP - n/a  Fasting Blood Sugar - n/a  Blood Thinner Instructions: n/a Aspirin Instructions: Patient states that she is not taking Aspirin every day. Patient was instructed: As of today, STOP taking any Aspirin (unless otherwise instructed by your surgeon) Aleve, Naproxen, Ibuprofen, Motrin, Advil, Goody's, BC's, all herbal medications, fish oil, and all vitamins.    ERAS Protcol - n/a  COVID TEST- done in PAT on 07/08/22   Anesthesia review: yes - cardiac history  Patient denies shortness of breath, fever, cough and chest pain at PAT appointment   All instructions explained to the patient, with a verbal understanding of the material. Patient agrees to go over the instructions while at home for a better understanding. Patient also instructed to self quarantine after being tested for COVID-19. The opportunity to ask questions was provided.

## 2022-07-09 ENCOUNTER — Inpatient Hospital Stay (HOSPITAL_COMMUNITY): Payer: Commercial Managed Care - HMO | Admitting: Vascular Surgery

## 2022-07-09 ENCOUNTER — Inpatient Hospital Stay (HOSPITAL_COMMUNITY): Payer: Commercial Managed Care - HMO

## 2022-07-09 ENCOUNTER — Encounter (HOSPITAL_COMMUNITY)
Admission: RE | Disposition: A | Discharge status: Home / Self Care | Payer: Self-pay | Source: Ambulatory Visit | Attending: Thoracic Surgery (Cardiothoracic Vascular Surgery)

## 2022-07-09 ENCOUNTER — Inpatient Hospital Stay (HOSPITAL_COMMUNITY): Payer: Commercial Managed Care - HMO | Admitting: Anesthesiology

## 2022-07-09 ENCOUNTER — Encounter (HOSPITAL_COMMUNITY): Payer: Self-pay | Admitting: Thoracic Surgery (Cardiothoracic Vascular Surgery)

## 2022-07-09 ENCOUNTER — Inpatient Hospital Stay (HOSPITAL_COMMUNITY)
Admission: RE | Admit: 2022-07-09 | Discharge: 2022-07-17 | DRG: 219 | Disposition: A | Discharge status: Home Health | Payer: Commercial Managed Care - HMO | Source: Ambulatory Visit | Attending: Thoracic Surgery (Cardiothoracic Vascular Surgery) | Admitting: Thoracic Surgery (Cardiothoracic Vascular Surgery)

## 2022-07-09 ENCOUNTER — Other Ambulatory Visit: Payer: Self-pay

## 2022-07-09 DIAGNOSIS — Z951 Presence of aortocoronary bypass graft: Secondary | ICD-10-CM | POA: Diagnosis not present

## 2022-07-09 DIAGNOSIS — I5022 Chronic systolic (congestive) heart failure: Secondary | ICD-10-CM | POA: Diagnosis present

## 2022-07-09 DIAGNOSIS — Z1152 Encounter for screening for COVID-19: Secondary | ICD-10-CM | POA: Diagnosis not present

## 2022-07-09 DIAGNOSIS — J439 Emphysema, unspecified: Secondary | ICD-10-CM | POA: Diagnosis present

## 2022-07-09 DIAGNOSIS — Z952 Presence of prosthetic heart valve: Principal | ICD-10-CM

## 2022-07-09 DIAGNOSIS — F419 Anxiety disorder, unspecified: Secondary | ICD-10-CM | POA: Diagnosis present

## 2022-07-09 DIAGNOSIS — J9811 Atelectasis: Secondary | ICD-10-CM | POA: Diagnosis present

## 2022-07-09 DIAGNOSIS — K760 Fatty (change of) liver, not elsewhere classified: Secondary | ICD-10-CM | POA: Diagnosis present

## 2022-07-09 DIAGNOSIS — F17210 Nicotine dependence, cigarettes, uncomplicated: Secondary | ICD-10-CM | POA: Diagnosis present

## 2022-07-09 DIAGNOSIS — I110 Hypertensive heart disease with heart failure: Secondary | ICD-10-CM | POA: Diagnosis present

## 2022-07-09 DIAGNOSIS — Z79899 Other long term (current) drug therapy: Secondary | ICD-10-CM | POA: Diagnosis not present

## 2022-07-09 DIAGNOSIS — F1210 Cannabis abuse, uncomplicated: Secondary | ICD-10-CM | POA: Diagnosis present

## 2022-07-09 DIAGNOSIS — I351 Nonrheumatic aortic (valve) insufficiency: Secondary | ICD-10-CM

## 2022-07-09 DIAGNOSIS — Z5900 Homelessness unspecified: Secondary | ICD-10-CM

## 2022-07-09 DIAGNOSIS — E875 Hyperkalemia: Secondary | ICD-10-CM | POA: Diagnosis present

## 2022-07-09 DIAGNOSIS — I509 Heart failure, unspecified: Secondary | ICD-10-CM

## 2022-07-09 DIAGNOSIS — I10 Essential (primary) hypertension: Secondary | ICD-10-CM | POA: Diagnosis not present

## 2022-07-09 DIAGNOSIS — K219 Gastro-esophageal reflux disease without esophagitis: Secondary | ICD-10-CM | POA: Diagnosis present

## 2022-07-09 DIAGNOSIS — Z8249 Family history of ischemic heart disease and other diseases of the circulatory system: Secondary | ICD-10-CM | POA: Diagnosis not present

## 2022-07-09 DIAGNOSIS — J449 Chronic obstructive pulmonary disease, unspecified: Secondary | ICD-10-CM | POA: Diagnosis not present

## 2022-07-09 DIAGNOSIS — D649 Anemia, unspecified: Secondary | ICD-10-CM | POA: Diagnosis present

## 2022-07-09 DIAGNOSIS — I429 Cardiomyopathy, unspecified: Secondary | ICD-10-CM | POA: Diagnosis present

## 2022-07-09 DIAGNOSIS — Z886 Allergy status to analgesic agent status: Secondary | ICD-10-CM | POA: Diagnosis not present

## 2022-07-09 DIAGNOSIS — E876 Hypokalemia: Secondary | ICD-10-CM | POA: Diagnosis present

## 2022-07-09 DIAGNOSIS — F32A Depression, unspecified: Secondary | ICD-10-CM | POA: Diagnosis present

## 2022-07-09 DIAGNOSIS — J9383 Other pneumothorax: Secondary | ICD-10-CM | POA: Diagnosis not present

## 2022-07-09 DIAGNOSIS — I2510 Atherosclerotic heart disease of native coronary artery without angina pectoris: Secondary | ICD-10-CM | POA: Diagnosis present

## 2022-07-09 DIAGNOSIS — I428 Other cardiomyopathies: Secondary | ICD-10-CM

## 2022-07-09 DIAGNOSIS — Z85118 Personal history of other malignant neoplasm of bronchus and lung: Secondary | ICD-10-CM

## 2022-07-09 DIAGNOSIS — J9601 Acute respiratory failure with hypoxia: Secondary | ICD-10-CM | POA: Diagnosis not present

## 2022-07-09 DIAGNOSIS — F17200 Nicotine dependence, unspecified, uncomplicated: Secondary | ICD-10-CM | POA: Diagnosis present

## 2022-07-09 DIAGNOSIS — R451 Restlessness and agitation: Secondary | ICD-10-CM | POA: Diagnosis present

## 2022-07-09 DIAGNOSIS — Z832 Family history of diseases of the blood and blood-forming organs and certain disorders involving the immune mechanism: Secondary | ICD-10-CM

## 2022-07-09 DIAGNOSIS — Z720 Tobacco use: Secondary | ICD-10-CM | POA: Diagnosis present

## 2022-07-09 HISTORY — PX: TEE WITHOUT CARDIOVERSION: SHX5443

## 2022-07-09 HISTORY — PX: AORTIC VALVE REPLACEMENT: SHX41

## 2022-07-09 LAB — BASIC METABOLIC PANEL
Anion gap: 5 (ref 5–15)
BUN: 6 mg/dL (ref 6–20)
CO2: 23 mmol/L (ref 22–32)
Calcium: 8.3 mg/dL — ABNORMAL LOW (ref 8.9–10.3)
Chloride: 113 mmol/L — ABNORMAL HIGH (ref 98–111)
Creatinine, Ser: 0.75 mg/dL (ref 0.44–1.00)
GFR, Estimated: >60 mL/min (ref >60)
Glucose, Bld: 117 mg/dL — ABNORMAL HIGH (ref 70–99)
Potassium: 4.3 mmol/L (ref 3.5–5.1)
Sodium: 141 mmol/L (ref 135–145)

## 2022-07-09 LAB — HEMOGLOBIN AND HEMATOCRIT, BLOOD
HCT: 23.6 % — ABNORMAL LOW (ref 36.0–46.0)
Hemoglobin: 8 g/dL — ABNORMAL LOW (ref 12.0–15.0)

## 2022-07-09 LAB — POCT I-STAT 7, (LYTES, BLD GAS, ICA,H+H)
Acid-Base Excess: 1 mmol/L (ref 0.0–2.0)
Acid-Base Excess: 0 mmol/L (ref 0.0–2.0)
Acid-base deficit: 3 mmol/L — ABNORMAL HIGH (ref 0.0–2.0)
Acid-base deficit: 2 mmol/L (ref 0.0–2.0)
Acid-base deficit: 3 mmol/L — ABNORMAL HIGH (ref 0.0–2.0)
Acid-base deficit: 2 mmol/L (ref 0.0–2.0)
Acid-base deficit: 1 mmol/L (ref 0.0–2.0)
Bicarbonate: 22.8 mmol/L (ref 20.0–28.0)
Bicarbonate: 24.9 mmol/L (ref 20.0–28.0)
Bicarbonate: 24.8 mmol/L (ref 20.0–28.0)
Bicarbonate: 26 mmol/L (ref 20.0–28.0)
Bicarbonate: 24.2 mmol/L (ref 20.0–28.0)
Bicarbonate: 22.4 mmol/L (ref 20.0–28.0)
Bicarbonate: 22.4 mmol/L (ref 20.0–28.0)
Calcium, Ion: 1.22 mmol/L (ref 1.15–1.40)
Calcium, Ion: 0.95 mmol/L — ABNORMAL LOW (ref 1.15–1.40)
Calcium, Ion: 1.22 mmol/L (ref 1.15–1.40)
Calcium, Ion: 1.32 mmol/L (ref 1.15–1.40)
Calcium, Ion: 1.24 mmol/L (ref 1.15–1.40)
Calcium, Ion: 1.16 mmol/L (ref 1.15–1.40)
Calcium, Ion: 1.16 mmol/L (ref 1.15–1.40)
HCT: 36 % (ref 36.0–46.0)
HCT: 26 % — ABNORMAL LOW (ref 36.0–46.0)
HCT: 26 % — ABNORMAL LOW (ref 36.0–46.0)
HCT: 33 % — ABNORMAL LOW (ref 36.0–46.0)
HCT: 34 % — ABNORMAL LOW (ref 36.0–46.0)
HCT: 32 % — ABNORMAL LOW (ref 36.0–46.0)
HCT: 32 % — ABNORMAL LOW (ref 36.0–46.0)
Hemoglobin: 12.2 g/dL (ref 12.0–15.0)
Hemoglobin: 8.8 g/dL — ABNORMAL LOW (ref 12.0–15.0)
Hemoglobin: 8.8 g/dL — ABNORMAL LOW (ref 12.0–15.0)
Hemoglobin: 11.2 g/dL — ABNORMAL LOW (ref 12.0–15.0)
Hemoglobin: 11.6 g/dL — ABNORMAL LOW (ref 12.0–15.0)
Hemoglobin: 10.9 g/dL — ABNORMAL LOW (ref 12.0–15.0)
Hemoglobin: 10.9 g/dL — ABNORMAL LOW (ref 12.0–15.0)
O2 Saturation: 100 %
O2 Saturation: 100 %
O2 Saturation: 100 %
O2 Saturation: 89 %
O2 Saturation: 88 %
O2 Saturation: 97 %
O2 Saturation: 91 %
Patient temperature: 36.3
Patient temperature: 37
Patient temperature: 36.4
Patient temperature: 37.1
Potassium: 3.3 mmol/L — ABNORMAL LOW (ref 3.5–5.1)
Potassium: 3.5 mmol/L (ref 3.5–5.1)
Potassium: 5.8 mmol/L — ABNORMAL HIGH (ref 3.5–5.1)
Potassium: 3.6 mmol/L (ref 3.5–5.1)
Potassium: 4.1 mmol/L (ref 3.5–5.1)
Potassium: 4.8 mmol/L (ref 3.5–5.1)
Potassium: 4 mmol/L (ref 3.5–5.1)
Sodium: 141 mmol/L (ref 135–145)
Sodium: 141 mmol/L (ref 135–145)
Sodium: 138 mmol/L (ref 135–145)
Sodium: 142 mmol/L (ref 135–145)
Sodium: 141 mmol/L (ref 135–145)
Sodium: 141 mmol/L (ref 135–145)
Sodium: 140 mmol/L (ref 135–145)
TCO2: 24 mmol/L (ref 22–32)
TCO2: 26 mmol/L (ref 22–32)
TCO2: 26 mmol/L (ref 22–32)
TCO2: 28 mmol/L (ref 22–32)
TCO2: 26 mmol/L (ref 22–32)
TCO2: 24 mmol/L (ref 22–32)
TCO2: 23 mmol/L (ref 22–32)
pCO2 arterial: 40.8 mmHg (ref 32–48)
pCO2 arterial: 34.1 mmHg (ref 32–48)
pCO2 arterial: 38.8 mmHg (ref 32–48)
pCO2 arterial: 58.2 mmHg — ABNORMAL HIGH (ref 32–48)
pCO2 arterial: 52.4 mmHg — ABNORMAL HIGH (ref 32–48)
pCO2 arterial: 35.3 mmHg (ref 32–48)
pCO2 arterial: 33.2 mmHg (ref 32–48)
pH, Arterial: 7.356 (ref 7.35–7.45)
pH, Arterial: 7.472 — ABNORMAL HIGH (ref 7.35–7.45)
pH, Arterial: 7.414 (ref 7.35–7.45)
pH, Arterial: 7.254 — ABNORMAL LOW (ref 7.35–7.45)
pH, Arterial: 7.273 — ABNORMAL LOW (ref 7.35–7.45)
pH, Arterial: 7.408 (ref 7.35–7.45)
pH, Arterial: 7.436 (ref 7.35–7.45)
pO2, Arterial: 406 mmHg — ABNORMAL HIGH (ref 83–108)
pO2, Arterial: 465 mmHg — ABNORMAL HIGH (ref 83–108)
pO2, Arterial: 256 mmHg — ABNORMAL HIGH (ref 83–108)
pO2, Arterial: 63 mmHg — ABNORMAL LOW (ref 83–108)
pO2, Arterial: 62 mmHg — ABNORMAL LOW (ref 83–108)
pO2, Arterial: 83 mmHg (ref 83–108)
pO2, Arterial: 59 mmHg — ABNORMAL LOW (ref 83–108)

## 2022-07-09 LAB — CBC
HCT: 32.1 % — ABNORMAL LOW (ref 36.0–46.0)
HCT: 32.5 % — ABNORMAL LOW (ref 36.0–46.0)
Hemoglobin: 10.7 g/dL — ABNORMAL LOW (ref 12.0–15.0)
Hemoglobin: 10.9 g/dL — ABNORMAL LOW (ref 12.0–15.0)
MCH: 30.6 pg (ref 26.0–34.0)
MCH: 30.4 pg (ref 26.0–34.0)
MCHC: 33.3 g/dL (ref 30.0–36.0)
MCHC: 33.5 g/dL (ref 30.0–36.0)
MCV: 91.7 fL (ref 80.0–100.0)
MCV: 90.5 fL (ref 80.0–100.0)
Platelets: 132 10*3/uL — ABNORMAL LOW (ref 150–400)
Platelets: 129 10*3/uL — ABNORMAL LOW (ref 150–400)
RBC: 3.5 MIL/uL — ABNORMAL LOW (ref 3.87–5.11)
RBC: 3.59 MIL/uL — ABNORMAL LOW (ref 3.87–5.11)
RDW: 14.5 % (ref 11.5–15.5)
RDW: 14.4 % (ref 11.5–15.5)
WBC: 19.6 10*3/uL — ABNORMAL HIGH (ref 4.0–10.5)
WBC: 15 10*3/uL — ABNORMAL HIGH (ref 4.0–10.5)
nRBC: 0 % (ref 0.0–0.2)
nRBC: 0 % (ref 0.0–0.2)

## 2022-07-09 LAB — POCT I-STAT, CHEM 8
BUN: 4 mg/dL — ABNORMAL LOW (ref 6–20)
BUN: 4 mg/dL — ABNORMAL LOW (ref 6–20)
BUN: 3 mg/dL — ABNORMAL LOW (ref 6–20)
BUN: 4 mg/dL — ABNORMAL LOW (ref 6–20)
BUN: 5 mg/dL — ABNORMAL LOW (ref 6–20)
Calcium, Ion: 1.19 mmol/L (ref 1.15–1.40)
Calcium, Ion: 1.22 mmol/L (ref 1.15–1.40)
Calcium, Ion: 0.93 mmol/L — ABNORMAL LOW (ref 1.15–1.40)
Calcium, Ion: 1.07 mmol/L — ABNORMAL LOW (ref 1.15–1.40)
Calcium, Ion: 1.2 mmol/L (ref 1.15–1.40)
Chloride: 107 mmol/L (ref 98–111)
Chloride: 107 mmol/L (ref 98–111)
Chloride: 102 mmol/L (ref 98–111)
Chloride: 105 mmol/L (ref 98–111)
Chloride: 106 mmol/L (ref 98–111)
Creatinine, Ser: 0.4 mg/dL — ABNORMAL LOW (ref 0.44–1.00)
Creatinine, Ser: 0.5 mg/dL (ref 0.44–1.00)
Creatinine, Ser: 0.4 mg/dL — ABNORMAL LOW (ref 0.44–1.00)
Creatinine, Ser: 0.5 mg/dL (ref 0.44–1.00)
Creatinine, Ser: 0.5 mg/dL (ref 0.44–1.00)
Glucose, Bld: 87 mg/dL (ref 70–99)
Glucose, Bld: 99 mg/dL (ref 70–99)
Glucose, Bld: 101 mg/dL — ABNORMAL HIGH (ref 70–99)
Glucose, Bld: 165 mg/dL — ABNORMAL HIGH (ref 70–99)
Glucose, Bld: 154 mg/dL — ABNORMAL HIGH (ref 70–99)
HCT: 36 % (ref 36.0–46.0)
HCT: 31 % — ABNORMAL LOW (ref 36.0–46.0)
HCT: 27 % — ABNORMAL LOW (ref 36.0–46.0)
HCT: 28 % — ABNORMAL LOW (ref 36.0–46.0)
HCT: 26 % — ABNORMAL LOW (ref 36.0–46.0)
Hemoglobin: 12.2 g/dL (ref 12.0–15.0)
Hemoglobin: 10.5 g/dL — ABNORMAL LOW (ref 12.0–15.0)
Hemoglobin: 9.2 g/dL — ABNORMAL LOW (ref 12.0–15.0)
Hemoglobin: 9.5 g/dL — ABNORMAL LOW (ref 12.0–15.0)
Hemoglobin: 8.8 g/dL — ABNORMAL LOW (ref 12.0–15.0)
Potassium: 3.2 mmol/L — ABNORMAL LOW (ref 3.5–5.1)
Potassium: 3.3 mmol/L — ABNORMAL LOW (ref 3.5–5.1)
Potassium: 3.5 mmol/L (ref 3.5–5.1)
Potassium: 4.1 mmol/L (ref 3.5–5.1)
Potassium: 5.8 mmol/L — ABNORMAL HIGH (ref 3.5–5.1)
Sodium: 141 mmol/L (ref 135–145)
Sodium: 141 mmol/L (ref 135–145)
Sodium: 141 mmol/L (ref 135–145)
Sodium: 141 mmol/L (ref 135–145)
Sodium: 137 mmol/L (ref 135–145)
TCO2: 23 mmol/L (ref 22–32)
TCO2: 25 mmol/L (ref 22–32)
TCO2: 27 mmol/L (ref 22–32)
TCO2: 27 mmol/L (ref 22–32)
TCO2: 25 mmol/L (ref 22–32)

## 2022-07-09 LAB — PROTIME-INR
INR: 1.5 — ABNORMAL HIGH (ref 0.8–1.2)
Prothrombin Time: 17.8 seconds — ABNORMAL HIGH (ref 11.4–15.2)

## 2022-07-09 LAB — GLUCOSE, CAPILLARY
Glucose-Capillary: 110 mg/dL — ABNORMAL HIGH (ref 70–99)
Glucose-Capillary: 167 mg/dL — ABNORMAL HIGH (ref 70–99)
Glucose-Capillary: 127 mg/dL — ABNORMAL HIGH (ref 70–99)
Glucose-Capillary: 127 mg/dL — ABNORMAL HIGH (ref 70–99)
Glucose-Capillary: 125 mg/dL — ABNORMAL HIGH (ref 70–99)
Glucose-Capillary: 119 mg/dL — ABNORMAL HIGH (ref 70–99)
Glucose-Capillary: 134 mg/dL — ABNORMAL HIGH (ref 70–99)

## 2022-07-09 LAB — MAGNESIUM: Magnesium: 2.9 mg/dL — ABNORMAL HIGH (ref 1.7–2.4)

## 2022-07-09 LAB — PLATELET COUNT: Platelets: 152 10*3/uL (ref 150–400)

## 2022-07-09 LAB — APTT: aPTT: 31 seconds (ref 24–36)

## 2022-07-09 SURGERY — REPLACEMENT, AORTIC VALVE, OPEN
Anesthesia: General | Site: Chest

## 2022-07-09 MED ORDER — PHENYLEPHRINE 80 MCG/ML (10ML) SYRINGE FOR IV PUSH (FOR BLOOD PRESSURE SUPPORT)
PREFILLED_SYRINGE | INTRAVENOUS | Status: DC | PRN
  Administered 2022-07-09: 80 ug via INTRAVENOUS

## 2022-07-09 MED ORDER — OXYCODONE HCL 5 MG PO TABS
5.0000 mg | ORAL_TABLET | ORAL | Status: DC | PRN
  Administered 2022-07-10 (×2): 10 mg via ORAL
  Administered 2022-07-11: 5 mg via ORAL
  Administered 2022-07-11 – 2022-07-14 (×8): 10 mg via ORAL
  Filled 2022-07-09: qty 2
  Filled 2022-07-09: qty 1
  Filled 2022-07-09 (×10): qty 2

## 2022-07-09 MED ORDER — MIDAZOLAM HCL (PF) 10 MG/2ML IJ SOLN
INTRAMUSCULAR | Status: AC
  Filled 2022-07-09: qty 2

## 2022-07-09 MED ORDER — FENTANYL CITRATE (PF) 250 MCG/5ML IJ SOLN
INTRAMUSCULAR | Status: AC
  Filled 2022-07-09: qty 5

## 2022-07-09 MED ORDER — LACTATED RINGERS IV SOLN: INTRAVENOUS | Status: DC | PRN

## 2022-07-09 MED ORDER — CHLORHEXIDINE GLUCONATE 4 % EX LIQD: 30.0000 mL | CUTANEOUS | Status: DC

## 2022-07-09 MED ORDER — SODIUM CHLORIDE 0.45 % IV SOLN: INTRAVENOUS | Status: DC | PRN

## 2022-07-09 MED ORDER — METOPROLOL TARTRATE 12.5 MG HALF TABLET: 12.5000 mg | ORAL_TABLET | Freq: Two times a day (BID) | ORAL | Status: DC

## 2022-07-09 MED ORDER — SODIUM CHLORIDE 0.9% FLUSH: 3.0000 mL | INTRAVENOUS | Status: DC | PRN

## 2022-07-09 MED ORDER — NITROGLYCERIN IN D5W 200-5 MCG/ML-% IV SOLN
0.0000 ug/min | INTRAVENOUS | Status: DC
  Administered 2022-07-09: 10 ug/min via INTRAVENOUS
  Filled 2022-07-09 (×2): qty 250

## 2022-07-09 MED ORDER — VANCOMYCIN HCL IN DEXTROSE 1-5 GM/200ML-% IV SOLN
1000.0000 mg | Freq: Once | INTRAVENOUS | Status: AC
  Administered 2022-07-09: 1000 mg via INTRAVENOUS
  Filled 2022-07-09: qty 200

## 2022-07-09 MED ORDER — ACETAMINOPHEN 500 MG PO TABS
1000.0000 mg | ORAL_TABLET | Freq: Four times a day (QID) | ORAL | Status: AC
  Administered 2022-07-10 – 2022-07-14 (×15): 1000 mg via ORAL
  Filled 2022-07-09 (×15): qty 2

## 2022-07-09 MED ORDER — DOCUSATE SODIUM 100 MG PO CAPS
200.0000 mg | ORAL_CAPSULE | Freq: Every day | ORAL | Status: DC
  Administered 2022-07-11 – 2022-07-17 (×5): 200 mg via ORAL
  Filled 2022-07-09 (×5): qty 2

## 2022-07-09 MED ORDER — SODIUM CHLORIDE (PF) 0.9 % IJ SOLN: OROMUCOSAL | Status: DC | PRN

## 2022-07-09 MED ORDER — ACETAMINOPHEN 160 MG/5ML PO SOLN
1000.0000 mg | Freq: Four times a day (QID) | ORAL | Status: AC
  Administered 2022-07-10 (×3): 1000 mg
  Filled 2022-07-09 (×3): qty 40.6

## 2022-07-09 MED ORDER — MIDAZOLAM HCL (PF) 5 MG/ML IJ SOLN
INTRAMUSCULAR | Status: DC | PRN
  Administered 2022-07-09 (×2): 2 mg via INTRAVENOUS
  Administered 2022-07-09: 3 mg via INTRAVENOUS
  Administered 2022-07-09 (×2): 2 mg via INTRAVENOUS

## 2022-07-09 MED ORDER — BISACODYL 10 MG RE SUPP: 10.0000 mg | Freq: Every day | RECTAL | Status: DC

## 2022-07-09 MED ORDER — CHLORHEXIDINE GLUCONATE 0.12 % MT SOLN
15.0000 mL | OROMUCOSAL | Status: AC
  Administered 2022-07-09: 15 mL via OROMUCOSAL
  Filled 2022-07-09: qty 15

## 2022-07-09 MED ORDER — MIDAZOLAM HCL 2 MG/2ML IJ SOLN
INTRAMUSCULAR | Status: AC
  Filled 2022-07-09: qty 2

## 2022-07-09 MED ORDER — ASPIRIN 81 MG PO CHEW
324.0000 mg | CHEWABLE_TABLET | Freq: Every day | ORAL | Status: DC
  Administered 2022-07-10: 324 mg
  Filled 2022-07-09: qty 4

## 2022-07-09 MED ORDER — PLASMA-LYTE A IV SOLN: INTRAVENOUS | Status: DC | PRN

## 2022-07-09 MED ORDER — DOBUTAMINE IN D5W 4-5 MG/ML-% IV SOLN
0.0000 ug/kg/min | INTRAVENOUS | Status: DC
  Administered 2022-07-09: 2.5 ug/kg/min via INTRAVENOUS
  Filled 2022-07-09: qty 250

## 2022-07-09 MED ORDER — ~~LOC~~ CARDIAC SURGERY, PATIENT & FAMILY EDUCATION
Freq: Once | Status: DC
  Filled 2022-07-09: qty 1

## 2022-07-09 MED ORDER — SODIUM CHLORIDE 0.9 % IV SOLN: 250.0000 mL | INTRAVENOUS | Status: DC | PRN

## 2022-07-09 MED ORDER — ARTIFICIAL TEARS OPHTHALMIC OINT
TOPICAL_OINTMENT | OPHTHALMIC | Status: DC | PRN
  Administered 2022-07-09: 1 via OPHTHALMIC

## 2022-07-09 MED ORDER — ONDANSETRON HCL 4 MG/2ML IJ SOLN
4.0000 mg | Freq: Four times a day (QID) | INTRAMUSCULAR | Status: DC | PRN
  Administered 2022-07-12: 4 mg via INTRAVENOUS
  Filled 2022-07-09: qty 2

## 2022-07-09 MED ORDER — LACTATED RINGERS IV SOLN: INTRAVENOUS | Status: DC

## 2022-07-09 MED ORDER — LACTATED RINGERS IV SOLN: 500.0000 mL | Freq: Once | INTRAVENOUS | Status: DC | PRN

## 2022-07-09 MED ORDER — CHLORHEXIDINE GLUCONATE 0.12 % MT SOLN
15.0000 mL | Freq: Once | OROMUCOSAL | Status: AC
  Administered 2022-07-09: 15 mL via OROMUCOSAL
  Filled 2022-07-09: qty 15

## 2022-07-09 MED ORDER — NICARDIPINE HCL IN NACL 20-0.86 MG/200ML-% IV SOLN
INTRAVENOUS | Status: AC
  Administered 2022-07-09: 3 mg/h via INTRAVENOUS
  Filled 2022-07-09: qty 200

## 2022-07-09 MED ORDER — INSULIN REGULAR(HUMAN) IN NACL 100-0.9 UT/100ML-% IV SOLN: INTRAVENOUS | Status: DC

## 2022-07-09 MED ORDER — ROCURONIUM BROMIDE 10 MG/ML (PF) SYRINGE
PREFILLED_SYRINGE | INTRAVENOUS | Status: AC
  Filled 2022-07-09: qty 10

## 2022-07-09 MED ORDER — MAGNESIUM SULFATE 4 GM/100ML IV SOLN
4.0000 g | Freq: Once | INTRAVENOUS | Status: AC
  Administered 2022-07-09: 4 g via INTRAVENOUS
  Filled 2022-07-09: qty 100

## 2022-07-09 MED ORDER — ORAL CARE MOUTH RINSE: 15.0000 mL | OROMUCOSAL | Status: DC | PRN

## 2022-07-09 MED ORDER — IPRATROPIUM-ALBUTEROL 0.5-2.5 (3) MG/3ML IN SOLN
3.0000 mL | Freq: Four times a day (QID) | RESPIRATORY_TRACT | Status: DC
  Administered 2022-07-09 – 2022-07-10 (×3): 3 mL via RESPIRATORY_TRACT
  Filled 2022-07-09 (×3): qty 3

## 2022-07-09 MED ORDER — METOPROLOL TARTRATE 25 MG/10 ML ORAL SUSPENSION: 12.5000 mg | Freq: Two times a day (BID) | ORAL | Status: DC

## 2022-07-09 MED ORDER — SODIUM CHLORIDE 0.9% FLUSH
3.0000 mL | Freq: Two times a day (BID) | INTRAVENOUS | Status: DC
  Administered 2022-07-10 – 2022-07-13 (×7): 3 mL via INTRAVENOUS

## 2022-07-09 MED ORDER — ALBUMIN HUMAN 5 % IV SOLN
250.0000 mL | INTRAVENOUS | Status: AC | PRN
  Administered 2022-07-09 (×2): 12.5 g via INTRAVENOUS
  Filled 2022-07-09: qty 250

## 2022-07-09 MED ORDER — PROPOFOL 10 MG/ML IV BOLUS
INTRAVENOUS | Status: AC
  Filled 2022-07-09: qty 20

## 2022-07-09 MED ORDER — POTASSIUM CHLORIDE 10 MEQ/50ML IV SOLN
10.0000 meq | INTRAVENOUS | Status: AC
  Administered 2022-07-09 (×3): 10 meq via INTRAVENOUS

## 2022-07-09 MED ORDER — LIDOCAINE 2% (20 MG/ML) 5 ML SYRINGE
INTRAMUSCULAR | Status: AC
  Filled 2022-07-09: qty 5

## 2022-07-09 MED ORDER — ACETAMINOPHEN 160 MG/5ML PO SOLN: 650.0000 mg | Freq: Once | ORAL | Status: AC

## 2022-07-09 MED ORDER — SODIUM CHLORIDE 0.9 % WEIGHT BASED INFUSION: 1.0000 mL/kg/h | INTRAVENOUS | Status: DC

## 2022-07-09 MED ORDER — ROCURONIUM BROMIDE 10 MG/ML (PF) SYRINGE
PREFILLED_SYRINGE | INTRAVENOUS | Status: DC | PRN
  Administered 2022-07-09 (×2): 50 mg via INTRAVENOUS
  Administered 2022-07-09: 100 mg via INTRAVENOUS
  Administered 2022-07-09: 50 mg via INTRAVENOUS

## 2022-07-09 MED ORDER — ORAL CARE MOUTH RINSE
15.0000 mL | OROMUCOSAL | Status: DC
  Administered 2022-07-09 – 2022-07-10 (×10): 15 mL via OROMUCOSAL

## 2022-07-09 MED ORDER — METOPROLOL TARTRATE 5 MG/5ML IV SOLN
2.5000 mg | INTRAVENOUS | Status: DC | PRN
  Administered 2022-07-12: 2.5 mg via INTRAVENOUS
  Filled 2022-07-09 (×2): qty 5

## 2022-07-09 MED ORDER — METRONIDAZOLE 0.75 % VA GEL
1.0000 | Freq: Every day | VAGINAL | Status: DC
  Filled 2022-07-09: qty 70

## 2022-07-09 MED ORDER — PROPOFOL 10 MG/ML IV BOLUS
INTRAVENOUS | Status: DC | PRN
  Administered 2022-07-09: 50 mg via INTRAVENOUS

## 2022-07-09 MED ORDER — FAMOTIDINE IN NACL 20-0.9 MG/50ML-% IV SOLN
20.0000 mg | Freq: Two times a day (BID) | INTRAVENOUS | Status: AC
  Administered 2022-07-09 (×2): 20 mg via INTRAVENOUS
  Filled 2022-07-09 (×2): qty 50

## 2022-07-09 MED ORDER — DEXMEDETOMIDINE HCL IN NACL 400 MCG/100ML IV SOLN
0.0000 ug/kg/h | INTRAVENOUS | Status: DC
  Administered 2022-07-09: 1.2 ug/kg/h via INTRAVENOUS
  Administered 2022-07-09: 0.7 ug/kg/h via INTRAVENOUS
  Administered 2022-07-10 (×2): 1.2 ug/kg/h via INTRAVENOUS
  Filled 2022-07-09 (×4): qty 100

## 2022-07-09 MED ORDER — MIDAZOLAM HCL 2 MG/2ML IJ SOLN
2.0000 mg | INTRAMUSCULAR | Status: DC | PRN
  Administered 2022-07-09 – 2022-07-10 (×11): 2 mg via INTRAVENOUS
  Filled 2022-07-09 (×11): qty 2

## 2022-07-09 MED ORDER — CEFAZOLIN SODIUM-DEXTROSE 2-4 GM/100ML-% IV SOLN
2.0000 g | Freq: Three times a day (TID) | INTRAVENOUS | Status: AC
  Administered 2022-07-09 – 2022-07-11 (×6): 2 g via INTRAVENOUS
  Filled 2022-07-09 (×6): qty 100

## 2022-07-09 MED ORDER — PANTOPRAZOLE SODIUM 40 MG PO TBEC
40.0000 mg | DELAYED_RELEASE_TABLET | Freq: Every day | ORAL | Status: DC
  Administered 2022-07-11 – 2022-07-17 (×7): 40 mg via ORAL
  Filled 2022-07-09 (×7): qty 1

## 2022-07-09 MED ORDER — SODIUM CHLORIDE 0.9 % IV SOLN: 250.0000 mL | INTRAVENOUS | Status: DC

## 2022-07-09 MED ORDER — 0.9 % SODIUM CHLORIDE (POUR BTL) OPTIME
TOPICAL | Status: DC | PRN
  Administered 2022-07-09: 5000 mL

## 2022-07-09 MED ORDER — FENTANYL CITRATE (PF) 250 MCG/5ML IJ SOLN
INTRAMUSCULAR | Status: DC | PRN
  Administered 2022-07-09: 200 ug via INTRAVENOUS
  Administered 2022-07-09: 25 ug via INTRAVENOUS
  Administered 2022-07-09: 175 ug via INTRAVENOUS
  Administered 2022-07-09 (×2): 100 ug via INTRAVENOUS
  Administered 2022-07-09: 250 ug via INTRAVENOUS
  Administered 2022-07-09: 50 ug via INTRAVENOUS
  Administered 2022-07-09: 100 ug via INTRAVENOUS

## 2022-07-09 MED ORDER — SODIUM CHLORIDE 0.9 % WEIGHT BASED INFUSION: 3.0000 mL/kg/h | INTRAVENOUS | Status: DC

## 2022-07-09 MED ORDER — DEXTROSE 50 % IV SOLN: 0.0000 mL | INTRAVENOUS | Status: DC | PRN

## 2022-07-09 MED ORDER — LIDOCAINE 2% (20 MG/ML) 5 ML SYRINGE
INTRAMUSCULAR | Status: DC | PRN
  Administered 2022-07-09: 100 mg via INTRAVENOUS

## 2022-07-09 MED ORDER — SUCCINYLCHOLINE CHLORIDE 200 MG/10ML IV SOSY
PREFILLED_SYRINGE | INTRAVENOUS | Status: AC
  Filled 2022-07-09: qty 10

## 2022-07-09 MED ORDER — SODIUM CHLORIDE 0.9 % IV SOLN: INTRAVENOUS | Status: DC

## 2022-07-09 MED ORDER — METOPROLOL TARTRATE 12.5 MG HALF TABLET: 12.5000 mg | ORAL_TABLET | Freq: Once | ORAL | Status: DC

## 2022-07-09 MED ORDER — ACETAMINOPHEN 650 MG RE SUPP
650.0000 mg | Freq: Once | RECTAL | Status: AC
  Administered 2022-07-09: 650 mg via RECTAL
  Filled 2022-07-09: qty 1

## 2022-07-09 MED ORDER — PROTAMINE SULFATE 10 MG/ML IV SOLN
INTRAVENOUS | Status: DC | PRN
  Administered 2022-07-09 (×2): 50 mg via INTRAVENOUS
  Administered 2022-07-09: 25 mg via INTRAVENOUS
  Administered 2022-07-09: 50 mg via INTRAVENOUS
  Administered 2022-07-09: 55 mg via INTRAVENOUS

## 2022-07-09 MED ORDER — SODIUM CHLORIDE 0.9 % IV SOLN: INTRAVENOUS | Status: DC | PRN

## 2022-07-09 MED ORDER — CHLORHEXIDINE GLUCONATE CLOTH 2 % EX PADS
6.0000 | MEDICATED_PAD | Freq: Every day | CUTANEOUS | Status: DC
  Administered 2022-07-09 – 2022-07-13 (×5): 6 via TOPICAL

## 2022-07-09 MED ORDER — ASPIRIN 325 MG PO TBEC
325.0000 mg | DELAYED_RELEASE_TABLET | Freq: Every day | ORAL | Status: DC
  Administered 2022-07-11 – 2022-07-17 (×7): 325 mg via ORAL
  Filled 2022-07-09 (×7): qty 1

## 2022-07-09 MED ORDER — TRAMADOL HCL 50 MG PO TABS
50.0000 mg | ORAL_TABLET | ORAL | Status: DC | PRN
  Administered 2022-07-10: 50 mg via ORAL
  Administered 2022-07-11 – 2022-07-13 (×7): 100 mg via ORAL
  Filled 2022-07-09: qty 1
  Filled 2022-07-09 (×8): qty 2

## 2022-07-09 MED ORDER — NICARDIPINE HCL IN NACL 20-0.86 MG/200ML-% IV SOLN
3.0000 mg/h | INTRAVENOUS | Status: DC
  Administered 2022-07-09: 3 mg/h via INTRAVENOUS
  Administered 2022-07-10 (×2): 5 mg/h via INTRAVENOUS
  Administered 2022-07-10: 7.5 mg/h via INTRAVENOUS
  Administered 2022-07-11 (×3): 15 mg/h via INTRAVENOUS
  Filled 2022-07-09: qty 400
  Filled 2022-07-09 (×2): qty 200
  Filled 2022-07-09: qty 400

## 2022-07-09 MED ORDER — MORPHINE SULFATE (PF) 2 MG/ML IV SOLN
1.0000 mg | INTRAVENOUS | Status: DC | PRN
  Administered 2022-07-09 (×2): 2 mg via INTRAVENOUS
  Administered 2022-07-09: 4 mg via INTRAVENOUS
  Administered 2022-07-09 (×3): 2 mg via INTRAVENOUS
  Administered 2022-07-09 – 2022-07-10 (×4): 4 mg via INTRAVENOUS
  Administered 2022-07-10 (×3): 2 mg via INTRAVENOUS
  Administered 2022-07-10: 4 mg via INTRAVENOUS
  Administered 2022-07-10: 2 mg via INTRAVENOUS
  Administered 2022-07-10 – 2022-07-11 (×3): 4 mg via INTRAVENOUS
  Filled 2022-07-09 (×3): qty 2
  Filled 2022-07-09 (×2): qty 1
  Filled 2022-07-09 (×2): qty 2
  Filled 2022-07-09 (×3): qty 1
  Filled 2022-07-09: qty 2
  Filled 2022-07-09: qty 1
  Filled 2022-07-09: qty 2
  Filled 2022-07-09 (×2): qty 1
  Filled 2022-07-09: qty 2
  Filled 2022-07-09: qty 1
  Filled 2022-07-09: qty 2
  Filled 2022-07-09: qty 1

## 2022-07-09 MED ORDER — ONDANSETRON HCL 4 MG/2ML IJ SOLN
INTRAMUSCULAR | Status: AC
  Filled 2022-07-09: qty 2

## 2022-07-09 MED ORDER — SODIUM CHLORIDE 0.9% FLUSH: 3.0000 mL | Freq: Two times a day (BID) | INTRAVENOUS | Status: DC

## 2022-07-09 MED ORDER — PHENYLEPHRINE HCL-NACL 20-0.9 MG/250ML-% IV SOLN: 0.0000 ug/min | INTRAVENOUS | Status: DC

## 2022-07-09 MED ORDER — ALBUMIN HUMAN 5 % IV SOLN: INTRAVENOUS | Status: DC | PRN

## 2022-07-09 MED ORDER — BISACODYL 5 MG PO TBEC
10.0000 mg | DELAYED_RELEASE_TABLET | Freq: Every day | ORAL | Status: DC
  Administered 2022-07-11: 10 mg via ORAL
  Filled 2022-07-09 (×3): qty 2

## 2022-07-09 SURGICAL SUPPLY — 94 items
ADAPTER CARDIO PERF ANTE/RETRO (ADAPTER) ×2 IMPLANT
BAG DECANTER FOR FLEXI CONT (MISCELLANEOUS) ×2 IMPLANT
BLADE CLIPPER SURG (BLADE) ×2 IMPLANT
BLADE STERNUM SYSTEM 6 (BLADE) ×2 IMPLANT
BLADE SURG 11 STRL SS (BLADE) IMPLANT
BLADE SURG 15 STRL LF DISP TIS (BLADE) ×2 IMPLANT
BLADE SURG 15 STRL SS (BLADE) ×2
CANISTER SUCT 3000ML PPV (MISCELLANEOUS) ×2 IMPLANT
CANNULA AORTIC ROOT 9FR (CANNULA) IMPLANT
CANNULA GUNDRY RCSP 15FR (MISCELLANEOUS) ×2 IMPLANT
CANNULA MC2 2 STG 29/37 NON-V (CANNULA) IMPLANT
CANNULA MC2 TWO STAGE (CANNULA) ×2
CANNULA NON VENT 20FR 12 (CANNULA) ×2 IMPLANT
CANNULA SUMP PERICARDIAL (CANNULA) IMPLANT
CATH HEART VENT LEFT (CATHETERS) ×2 IMPLANT
CATH ROBINSON RED A/P 18FR (CATHETERS) ×6 IMPLANT
CNTNR URN SCR LID CUP LEK RST (MISCELLANEOUS) ×2 IMPLANT
CONN ST 1/2X1/2  BEN (MISCELLANEOUS) ×2
CONN ST 1/2X1/2 BEN (MISCELLANEOUS) ×2 IMPLANT
CONN ST 1/4X3/8  BEN (MISCELLANEOUS) ×4
CONN ST 1/4X3/8 BEN (MISCELLANEOUS) ×4 IMPLANT
CONNECTOR BLAKE 2:1 CARIO BLK (MISCELLANEOUS) IMPLANT
CONT SPEC 4OZ STRL OR WHT (MISCELLANEOUS) ×2
CONTAINER PROTECT SURGISLUSH (MISCELLANEOUS) ×4 IMPLANT
COVER MAYO STAND STRL (DRAPES) IMPLANT
COVER SURGICAL LIGHT HANDLE (MISCELLANEOUS) ×2 IMPLANT
DEVICE SUT CK QUICK LOAD MINI (Prosthesis & Implant Heart) IMPLANT
DRAIN CHANNEL 28F RND 3/8 FF (WOUND CARE) ×4 IMPLANT
DRAPE CARDIOVASCULAR INCISE (DRAPES) ×4
DRAPE SRG 135.5X100X77XCV INC (DRAPES) IMPLANT
DRAPE SRG 135X102X78XABS (DRAPES) ×2 IMPLANT
DRAPE WARM FLUID 44X44 (DRAPES) ×2 IMPLANT
DRSG AQUACEL AG ADV 3.5X10 (GAUZE/BANDAGES/DRESSINGS) IMPLANT
DRSG COVADERM 4X14 (GAUZE/BANDAGES/DRESSINGS) ×2 IMPLANT
ELECT BLADE 4.0 EZ CLEAN MEGAD (MISCELLANEOUS) ×2
ELECT CAUTERY BLADE 6.4 (BLADE) ×2 IMPLANT
ELECT REM PT RETURN 9FT ADLT (ELECTROSURGICAL) ×4
ELECTRODE BLDE 4.0 EZ CLN MEGD (MISCELLANEOUS) IMPLANT
ELECTRODE REM PT RTRN 9FT ADLT (ELECTROSURGICAL) ×4 IMPLANT
FELT TEFLON 1X6 (MISCELLANEOUS) ×4 IMPLANT
GAUZE 4X4 16PLY ~~LOC~~+RFID DBL (SPONGE) ×2 IMPLANT
GAUZE SPONGE 4X4 12PLY STRL (GAUZE/BANDAGES/DRESSINGS) ×2 IMPLANT
GLOVE BIO SURGEON STRL SZ 6 (GLOVE) IMPLANT
GLOVE BIO SURGEON STRL SZ 6.5 (GLOVE) IMPLANT
GLOVE BIO SURGEON STRL SZ7 (GLOVE) ×6 IMPLANT
GLOVE BIO SURGEON STRL SZ7.5 (GLOVE) IMPLANT
GLOVE BIOGEL PI IND STRL 6 (GLOVE) IMPLANT
GLOVE BIOGEL PI IND STRL 6.5 (GLOVE) IMPLANT
GLOVE BIOGEL PI IND STRL 7.0 (GLOVE) IMPLANT
GLOVE EUDERMIC 7 POWDERFREE (GLOVE) ×4 IMPLANT
GLOVE SS BIOGEL STRL SZ 6 (GLOVE) IMPLANT
GLOVE SURG SS PI 6.0 STRL IVOR (GLOVE) IMPLANT
GOWN STRL REUS W/ TWL LRG LVL3 (GOWN DISPOSABLE) ×8 IMPLANT
GOWN STRL REUS W/ TWL XL LVL3 (GOWN DISPOSABLE) ×4 IMPLANT
GOWN STRL REUS W/TWL LRG LVL3 (GOWN DISPOSABLE) ×12
GOWN STRL REUS W/TWL XL LVL3 (GOWN DISPOSABLE) ×4
HEMOSTAT POWDER SURGIFOAM 1G (HEMOSTASIS) ×6 IMPLANT
HEMOSTAT SURGICEL 2X14 (HEMOSTASIS) ×2 IMPLANT
INSERT SUTURE HOLDER (MISCELLANEOUS) ×2 IMPLANT
IV ADAPTER SYR DOUBLE MALE LL (MISCELLANEOUS) IMPLANT
KIT BASIN OR (CUSTOM PROCEDURE TRAY) ×2 IMPLANT
KIT SUCTION CATH 14FR (SUCTIONS) ×2 IMPLANT
KIT SUT CK MINI COMBO 4X17 (Prosthesis & Implant Heart) IMPLANT
KIT TURNOVER KIT B (KITS) ×2 IMPLANT
LEAD PACING MYOCARDI (MISCELLANEOUS) IMPLANT
NS IRRIG 1000ML POUR BTL (IV SOLUTION) ×12 IMPLANT
PACK E OPEN HEART (SUTURE) ×2 IMPLANT
PACK OPEN HEART (CUSTOM PROCEDURE TRAY) ×2 IMPLANT
PAD ARMBOARD 7.5X6 YLW CONV (MISCELLANEOUS) ×4 IMPLANT
POSITIONER HEAD DONUT 9IN (MISCELLANEOUS) ×2 IMPLANT
SET CARDIO DLP MULTI-PER 6-LEG (CARDIAC RHYTHM DISPOSABLE) IMPLANT
SPONGE T-LAP 18X18 ~~LOC~~+RFID (SPONGE) ×8 IMPLANT
SUT BONE WAX W31G (SUTURE) ×2 IMPLANT
SUT EB EXC GRN/WHT 2-0 V-5 (SUTURE) ×4 IMPLANT
SUT MNCRL AB 3-0 PS2 18 (SUTURE) IMPLANT
SUT PDS AB 1 CTX 36 (SUTURE) IMPLANT
SUT PROLENE 3 0 SH DA (SUTURE) IMPLANT
SUT PROLENE 3 0 SH1 36 (SUTURE) ×2 IMPLANT
SUT PROLENE 4 0 RB 1 (SUTURE) ×8
SUT PROLENE 4 0 SH DA (SUTURE) IMPLANT
SUT PROLENE 4-0 RB1 .5 CRCL 36 (SUTURE) ×6 IMPLANT
SUT STEEL 6MS V (SUTURE) IMPLANT
SYR BULB IRRIG 60ML STRL (SYRINGE) IMPLANT
SYSTEM SAHARA CHEST DRAIN ATS (WOUND CARE) ×2 IMPLANT
TAPE CLOTH SURG 4X10 WHT LF (GAUZE/BANDAGES/DRESSINGS) IMPLANT
TAPE PAPER 2X10 WHT MICROPORE (GAUZE/BANDAGES/DRESSINGS) IMPLANT
TOWEL GREEN STERILE (TOWEL DISPOSABLE) ×2 IMPLANT
TOWEL GREEN STERILE FF (TOWEL DISPOSABLE) ×2 IMPLANT
TRAY FOLEY SLVR 16FR TEMP STAT (SET/KITS/TRAYS/PACK) ×2 IMPLANT
TUBING ART PRESS 48 MALE/FEM (TUBING) IMPLANT
UNDERPAD 30X36 HEAVY ABSORB (UNDERPADS AND DIAPERS) ×2 IMPLANT
VALVE AORTIC SZ23 INSP/RESIL (Prosthesis & Implant Heart) IMPLANT
VENT LEFT HEART 12002 (CATHETERS) ×2
WATER STERILE IRR 1000ML POUR (IV SOLUTION) ×4 IMPLANT

## 2022-07-09 NOTE — Interval H&P Note (Signed)
History and Physical Interval Note:  07/09/2022 7:29 AM  Rachael Stone  has presented today for surgery, with the diagnosis of AI.  The various methods of treatment have been discussed with the patient and family. After consideration of risks, benefits and other options for treatment, the patient has consented to  Procedure(s): AORTIC VALVE REPLACEMENT (AVR) (N/A) TRANSESOPHAGEAL ECHOCARDIOGRAM (TEE) (N/A) as a surgical intervention.  The patient's history has been reviewed, patient examined, no change in status, stable for surgery.  I have reviewed the patient's chart and labs.  Questions were answered to the patient's satisfaction.     Katryna Tschirhart Bary Leriche

## 2022-07-09 NOTE — Discharge Instructions (Signed)

## 2022-07-09 NOTE — Consult Note (Signed)
NAME:  Rachael Stone, MRN:  [id number], DOB:  Aug 19, 1965, LOS: 0 ADMISSION DATE:  07/09/2022, CONSULTATION DATE:  12/27 REFERRING MD:  lightfoot, CHIEF COMPLAINT:  post-op critical are    History of Present Illness:  56 year old female w/ hx as outlined below presents to CVICU post op on 12/27 s/p AVR on full vent support. PCCM asked to assist w/ post op vent and critical care management   Pertinent  Medical History  Severe AVI HFrEF (40-45%) Remote tobacco abuse  HTN Left upper lobe bx proven NSCLC  Significant Hospital Events: Including procedures, antibiotic start and stop dates in addition to other pertinent events   12/27 post-op AVR   Interim History / Subjective:  Returning to ICU   Objective   Blood pressure (Abnormal) 152/78, pulse 73, temperature 97.8 F (36.6 C), temperature source Oral, resp. rate 15, height 5\' 5"  (1.651 m), weight 66.2 kg, SpO2 97 %. PAP: (21-39)/(15-32) 21/15  Vent Mode: PRVC;SIMV;PSV FiO2 (%):  [50 %] 50 % Set Rate:  [12 bmp] 12 bmp Vt Set:  [450 mL] 450 mL PEEP:  [5 cmH20] 5 cmH20 Pressure Support:  [10 cmH20] 10 cmH20   Intake/Output Summary (Last 24 hours) at 07/09/2022 1251 Last data filed at 07/09/2022 1152 Gross per 24 hour  Intake 2600 ml  Output 2475 ml  Net 125 ml   Filed Weights   07/09/22 0542  Weight: 66.2 kg    Examination: General: otherwise healthy appearing 56 year old female sedated on vent  HENT: NCAT orally intubated  Lungs: clear. Dec bases Cardiovascular: intrinsic hr on tele. Has TV pacer (set at 26). Mid sternal incision is CD&I, sternal drains intact Abdomen: soft Extremities: warm Neuro: sedated GU: cl yellow via foley   Resolved Hospital Problem list     Assessment & Plan:  Severe AI now S/p AVR w/ resilia AV Plan Routine post-op care per protocol  Wean pressors  CT management per surgery  Cont tele  Glycemic control  Low dose BB  H/o HFrEF Plan Tele  Holding Azor for now as well as  lasix and ARB  Need for mechanical ventilation  Plan Rapid wean protocol  VAP bundle  PAD RASS goal 0 to -1  Post-op anemia. Expected Received cell savor intra-op  Plan Trend cbc  Hyperkalemia Plan Recheck (was from [place])  H/o NSCLC  Plan F/u ou pt   Best Practice (right click and "Reselect all SmartList Selections" daily)   Diet/type: NPO DVT prophylaxis: SCDSCD GI prophylaxis: PPI Lines: Central line Foley:  Yes, and it is still needed Code Status:  full code Last date of multidisciplinary goals of care discussion [NA]  Labs   CBC: Recent Labs  Lab 07/08/22 1053 07/09/22 0804 07/09/22 0938 07/09/22 1018 07/09/22 1035 07/09/22 1138 07/09/22 1145  WBC 5.9  --   --   --   --   --   --   HGB 11.8*   < > 9.2* 9.5* 8.0* 8.8* 8.8*  HCT 37.1   < > 27.0* 28.0* 23.6* 26.0* 26.0*  MCV 93.0  --   --   --   --   --   --   PLT 200  --   --   --  152  --   --    < > = values in this interval not displayed.    Basic Metabolic Panel: Recent Labs  Lab 07/08/22 1053 07/09/22 0804 07/09/22 2951 07/09/22 0912 07/09/22 0933 07/09/22 8841 07/09/22  1018 07/09/22 1138 07/09/22 1145  NA 141   < > 141 141 141 141 141 138 137  K 3.7   < > 3.2* 3.3* 3.5 3.5 4.1 5.8* 5.8*  CL 111  --  107 107  --  102 105  --  106  CO2 20*  --   --   --   --   --   --   --   --   GLUCOSE 100*  --  87 99  --  101* 165*  --  154*  BUN <5*  --  4* 4*  --  3* 4*  --  5*  CREATININE 0.65  --  0.40* 0.50  --  0.40* 0.50  --  0.50  CALCIUM 8.7*  --   --   --   --   --   --   --   --    < > = values in this interval not displayed.   GFR: Estimated Creatinine Clearance: 72.3 mL/min (by C-G formula based on SCr of 0.5 mg/dL). Recent Labs  Lab 07/08/22 1053  WBC 5.9    Liver Function Tests: Recent Labs  Lab 07/08/22 1053  AST 65*  ALT 26  ALKPHOS 61  BILITOT 0.6  PROT 7.3  ALBUMIN 3.6   No results for input(s): "LIPASE", "AMYLASE" in the last 168 hours. No results for input(s):  "AMMONIA" in the last 168 hours.  ABG    Component Value Date/Time   PHART 7.414 07/09/2022 1138   PCO2ART 38.8 07/09/2022 1138   PO2ART 256 (H) 07/09/2022 1138   HCO3 24.8 07/09/2022 1138   TCO2 25 07/09/2022 1145   ACIDBASEDEF 3.0 (H) 07/09/2022 0804   O2SAT 100 07/09/2022 1138     Coagulation Profile: Recent Labs  Lab 07/08/22 1053  INR 1.1    Cardiac Enzymes: No results for input(s): "CKTOTAL", "CKMB", "CKMBINDEX", "TROPONINI" in the last 168 hours.  HbA1C: Hgb A1c MFr Bld  Date/Time Value Ref Range Status  07/08/2022 10:53 AM 5.5 4.8 - 5.6 % Final    Comment:    (NOTE)         Prediabetes: 5.7 - 6.4         Diabetes: >6.4         Glycemic control for adults with diabetes: <7.0   01/10/2019 01:30 PM 5.3 4.8 - 5.6 % Final    Comment:    (NOTE) Pre diabetes:          5.7%-6.4% Diabetes:              >6.4% Glycemic control for   <7.0% adults with diabetes     CBG: No results for input(s): "GLUCAP" in the last 168 hours.  Review of Systems:   Not able   Past Medical History:  She,  has a past medical history of Adenocarcinoma of left lung ([place]) (04/15/2022), Arthritis, Back pain, Cardiomyopathy ([place]), COPD (chronic obstructive pulmonary disease) ([place]), DEPRESSIVE DISORDER NOT ELSEWHERE CLASSIFIED (12/14/2008), DYSFUNCTIONAL UTERINE BLEEDING (11/12/2007), Dyspnea, ETOH abuse, Hypertension, Hypertensive heart disease with heart failure ([place]), Marijuana abuse, MENORRHAGIA (12/14/2008), Moderate aortic insufficiency, Moderate to Severe Mitral Regurgitation, and Tobacco abuse.   Surgical History:   Past Surgical History:  Procedure Laterality Date   BRONCHIAL BIOPSY  04/15/2022   Procedure: BRONCHIAL BIOPSIES;  Surgeon: [name], DO;  Location: [facility];  Service: Pulmonary;;   BRONCHIAL NEEDLE ASPIRATION BIOPSY  04/15/2022   Procedure: BRONCHIAL NEEDLE ASPIRATION BIOPSIES;  Surgeon: [name],  DO;  Location: [facility];  Service:  Pulmonary;;   BUBBLE STUDY  05/12/2022   Procedure: BUBBLE STUDY;  Surgeon: [name], MD;  Location: [facility];  Service: Cardiovascular;;   CARDIAC CATHETERIZATION N/A 04/10/2016   Procedure: Right/Left Heart Cath and Coronary Angiography;  Surgeon: [name], MD;  Location: [facility];  Service: Cardiovascular;  Laterality: N/A;   EXPLORATORY LAPAROTOMY  07/14/2001   Ectopic Pregnancy - unsure which side or if tube was removed   RIGHT/LEFT HEART CATH AND CORONARY ANGIOGRAPHY N/A 05/12/2022   Procedure: RIGHT/LEFT HEART CATH AND CORONARY ANGIOGRAPHY;  Surgeon: [name], MD;  Location: [facility];  Service: Cardiovascular;  Laterality: N/A;   TEE WITHOUT CARDIOVERSION N/A 06/26/2017   Procedure: TRANSESOPHAGEAL ECHOCARDIOGRAM (TEE);  Surgeon: [name], MD;  Location: [facility];  Service: Cardiovascular;  Laterality: N/A;   TEE WITHOUT CARDIOVERSION N/A 05/12/2022   Procedure: TRANSESOPHAGEAL ECHOCARDIOGRAM (TEE);  Surgeon: [name], MD;  Location: [facility];  Service: Cardiovascular;  Laterality: N/A;   TONSILLECTOMY     TOOTH EXTRACTION N/A 06/26/2022   Procedure: DENTAL RESTORATION/EXTRACTIONS;  Surgeon: Charlaine Dalton, DMD;  Location: [facility];  Service: Dentistry;  Laterality: N/A;   TUBAL LIGATION       Social History:   reports that she has been smoking cigarettes. She has a 8.50 pack-year smoking history. She has never used smokeless tobacco. She reports current alcohol use of about 6.0 standard drinks of alcohol per week. She reports current drug use. Drug: Marijuana.   Family History:  Her family history includes CAD in her mother; Cirrhosis in her father; Lupus in her sister. There is no history of Colon cancer or Colon polyps.   Allergies Allergies  Allergen Reactions   Ibuprofen Other (See Comments)    Irritates stomach     Home Medications  Prior to Admission medications   Medication Sig Start  Date End Date Taking? Authorizing Provider  aspirin EC 81 MG tablet Take 81 mg by mouth 2 (two) times a week. Swallow whole.   Yes [provider]  carvedilol (COREG) 3.125 MG tablet Take 1 tablet (3.125 mg total) by mouth 2 (two) times daily. Patient taking differently: Take 3.125 mg by mouth 2 (two) times daily with a meal. 11/08/21  Yes Nahser, Wonda Cheng, MD  cyanocobalamin (VITAMIN B12) 1000 MCG tablet Take 1,000 mcg by mouth daily.   Yes [provider]  gabapentin (NEURONTIN) 100 MG capsule Take 100 mg by mouth in the morning and at bedtime. 05/27/22  Yes [provider]  losartan (COZAAR) 25 MG tablet Take 1 tablet by mouth once daily 03/18/22  Yes Nahser, Wonda Cheng, MD  Multiple Vitamins-Minerals (WOMENS MULTI PO) Take 1 tablet by mouth 4 (four) times a week.   Yes [provider]  potassium chloride (KLOR-CON) 10 MEQ tablet Take 1 tablet (10 mEq total) by mouth daily. 04/29/22  Yes Nahser, Wonda Cheng, MD  acetaminophen (TYLENOL) 500 MG tablet Take 500 mg by mouth every 8 (eight) hours as needed for moderate pain.    [provider]  amLODipine-olmesartan (AZOR) 5-20 MG tablet Take 1 tablet by mouth daily. 03/07/22   [provider]  furosemide (LASIX) 20 MG tablet Take 1 tablet (20 mg total) by mouth daily. 11/08/21   Nahser, Wonda Cheng, MD  Menthol, Topical Analgesic, (ICY HOT EX) Apply 1 Application topically daily as needed (pain).    [provider]  metroNIDAZOLE (METROGEL) 0.75 %  vaginal gel Place 1 Applicatorful vaginally at bedtime. 06/17/22   [provider]     Critical care time: 32 min    Erick Colace ACNP-BC [place] Pager # [phone] OR # [phone] if no answer

## 2022-07-09 NOTE — Anesthesia Procedure Notes (Signed)
Central Venous Catheter Insertion Performed by: Santa Lighter, MD, anesthesiologist Start/End12/27/2023 7:00 AM, 07/09/2022 7:05 AM Patient location: Pre-op. Preanesthetic checklist: patient identified, IV checked, site marked, risks and benefits discussed, surgical consent, monitors and equipment checked, pre-op evaluation and timeout performed Position: Trendelenburg Hand hygiene performed  and maximum sterile barriers used  Total catheter length 100. PA cath was placed.Swan type:thermodilution PA Cath depth:46 Procedure performed without using ultrasound guided technique. Attempts: 1 Patient tolerated the procedure well with no immediate complications.

## 2022-07-09 NOTE — Anesthesia Procedure Notes (Signed)
Arterial Line Insertion Start/End12/27/2023 6:50 AM, 07/09/2022 7:00 AM Performed by: Lavell Luster, CRNA  Patient location: Pre-op. Preanesthetic checklist: patient identified, IV checked, site marked, risks and benefits discussed, surgical consent, monitors and equipment checked, pre-op evaluation and timeout performed Lidocaine 1% used for infiltration and patient sedated Left, radial was placed Catheter size: 20 G Hand hygiene performed , maximum sterile barriers used  and Seldinger technique used Allen's test indicative of satisfactory collateral circulation Attempts: 1 Procedure performed without using ultrasound guided technique. Following insertion, Biopatch and dressing applied. Post procedure assessment: normal  Patient tolerated the procedure well with no immediate complications.

## 2022-07-09 NOTE — Transfer of Care (Signed)
Immediate Anesthesia Transfer of Care Note  Patient: Loria A Triggs  Procedure(s) Performed: AORTIC VALVE REPLACEMENT (AVR) USING 23MM INSPIRIS RESILIA AORTIC VALVE (Chest) TRANSESOPHAGEAL ECHOCARDIOGRAM (TEE)  Patient Location: SICU  Anesthesia Type:General  Level of Consciousness: Patient remains intubated per anesthesia plan  Airway & Oxygen Therapy: Patient remains intubated per anesthesia plan and Patient placed on Ventilator (see vital sign flow sheet for setting)  Post-op Assessment: Post -op Vital signs reviewed and stable  Post vital signs: stable  Last Vitals:  Vitals Value Taken Time  BP    Temp    Pulse    Resp    SpO2      Last Pain:  Vitals:   07/09/22 0556  TempSrc:   PainSc: 0-No pain      Patients Stated Pain Goal: 0 (32/99/24 2683)  Complications: No notable events documented.

## 2022-07-09 NOTE — Op Note (Signed)
[address]       [place] [address]             [phone]                                           07/09/2022 Patient:  Rachael Stone Pre-Op Dx: Severe aortic valve insufficiency Congestive heart failure NSCLC   Post-op Dx:  same Procedure: Aortic valve replacement with a 46mm Inspiris valve     Surgeon and Role:      * Kenny Stern, Lucile Crater, MD - Primary    * B. Stehler, PA-C  An experienced assistant was required given the complexity of this surgery and the standard of surgical care. The assistant was needed for exposure, dissection, suctioning, retraction of delicate tissues and sutures, instrument exchange and for overall help during this procedure.    Anesthesia  general EBL:  547ml Blood Administration: none Xclamp Time:  79 min Pump Time:  141min  Drains: 74 F blake drain: mediastinal X 2 Wires: ventricular Counts: correct   Indications: 56 year old female with severe aortic valve regurgitation with evidence of heart failure, and biopsy-proven left upper lobe non-small cell lung cancer.  She continues to smoke however given the degree of her aortic valve insufficiency her lung cancer will be secondary to this.  Recommended that she undergo an aortic valve replacement.  She would like to have a bioprosthetic valve and not be on Coumadin.  She has had her dental extractions.   In regards to her lung cancer given that she will require open heart surgery will be prudent for her to be treated with SBRT so that we do not delay her care any further.  She was originally diagnosed in August 2023.  She recently had a biopsy last month but has not made any significant progress in regards to smoking cessation.   Findings: Trileaflet valve.  Retracted left coronary leaflet.    Operative Technique: All invasive lines were placed in pre-op holding.  After the risks, benefits and alternatives were thoroughly discussed, the patient was brought to the  operative theatre.  Anesthesia was induced, and the patient was prepped and draped in normal sterile fashion.  An appropriate surgical pause was performed, and pre-operative antibiotics were dosed accordingly.  We began with an incision over the chest for the sternotomy.  This was carried down with bovie cautery, and the sternum was divided with a reciprocating saw.  Meticulous hemostasis was obtained.  The patient was systemically heparinized.   The sternal retractor was placed.  The pericardium was divided in the midline and fashioned into a cradle with pericardial stitches.   After we confirmed an appropriate ACT, the ascending aorta was cannulated in standard fashion.  The right atrial appendage was used for venous cannulation site.  Cardiopulmonary bypass was initiated and we began to cool the patient to 32 degrees. The cross clamp was applied, and a dose of anterograde cardioplegia was given with good arrest of the heart.  Our aortotomy was made and directed toward the non coronary cusp.  The valve was inspected.   All leaflets were excised.  The annulus was sized to a 39mm Inspirus valve.  The left ventricle was then copiously irrigated.  Pledgeted mattress sutures were placed circumferentially through the annulus.  These sutures were then passed through the sewing ring of the  valve.  Once the valve was seated in the annulus, it was secured with Core-knot sutures.  We began to rewarm, and close our aortotomy in 2 layers.  A re-animation dose of cardioplegia was given.  After de-airing the heart, the aortic cross clamp was removed.  We checked our valve function, and for air using the TEE.  Once we were satisfying, we separated from cardiopulmonary bypass without event.    The heparin was reversed with protamine, and hemostasis was obtained.  Chest tubes and wires were placed, and the sternum was re-approximated with with sternal wires.  The soft tissue and skin were re-approximated wth absorbable suture.     The patient tolerated the procedure without any immediate complications, and was transferred to the ICU in guarded condition.  Aundray Cartlidge Bary Leriche

## 2022-07-09 NOTE — Anesthesia Procedure Notes (Signed)
Central Venous Catheter Insertion Performed by: Santa Lighter, MD, anesthesiologist Start/End12/27/2023 6:50 AM, 07/09/2022 7:00 AM Patient location: Pre-op. Preanesthetic checklist: patient identified, IV checked, site marked, risks and benefits discussed, surgical consent, monitors and equipment checked, pre-op evaluation, timeout performed and anesthesia consent Position: Trendelenburg Lidocaine 1% used for infiltration and patient sedated Hand hygiene performed , maximum sterile barriers used  and Seldinger technique used Catheter size: 9 Fr Central line was placed.MAC introducer Procedure performed using ultrasound guided technique. Ultrasound Notes:anatomy identified, needle tip was noted to be adjacent to the nerve/plexus identified, no ultrasound evidence of intravascular and/or intraneural injection and image(s) printed for medical record Attempts: 1 Following insertion, line sutured, dressing applied and Biopatch. Post procedure assessment: free fluid flow, blood return through all ports and no air  Patient tolerated the procedure well with no immediate complications.

## 2022-07-09 NOTE — Hospital Course (Addendum)
History of Present Illness:     [name] is a 56 year old female presents for surgical evaluation of severe aortic valve insufficiency. She was also previously seen for a left upper lobe biopsy-proven non-small cell lung cancer.  She continues to smoke and admits to exertional shortness of breath.  She remains very hesitant to proceed with any surgical procedures.  Dr. [name] reviewed the patient's studies and determined surgical intervention would provide the patient the best long term treatment. He discussed her treatment options as well as the risks and benefits of surgery. [name] was agreeable to proceed with surgery.  Hospital Course: [name] arrived at [facility] and was brought to the operating room on 07/10/22. She underwent an aortic valve replacement utilizing a 37mm Inspiris Resilia Aortic valve, she tolerated the procedure well and was transferred to the SICU in stable condition. She was extubated on POD1 without complication. She was noted to have a left pneumothorax on POD1 and pigtail chest tube was placed.  She was weaned off Cardene as BP allowed.  The patient's pacing wires were removed without difficulty.  She was started on diuretics for volume overloaded state.  Her mediastinal chest tubes were removed without difficulty.

## 2022-07-09 NOTE — Anesthesia Procedure Notes (Signed)
Procedure Name: Intubation Date/Time: 07/09/2022 8:00 AM  Performed by: Lavell Luster, CRNAPre-anesthesia Checklist: Patient identified, Emergency Drugs available, Suction available, Patient being monitored and Timeout performed Patient Re-evaluated:Patient Re-evaluated prior to induction Oxygen Delivery Method: Circle system utilized Preoxygenation: Pre-oxygenation with 100% oxygen Induction Type: IV induction Ventilation: Mask ventilation without difficulty Laryngoscope Size: Mac and 3 Grade View: Grade I Tube type: Oral Tube size: 7.5 mm Number of attempts: 1 Airway Equipment and Method: Stylet Placement Confirmation: ETT inserted through vocal cords under direct vision, positive ETCO2 and breath sounds checked- equal and bilateral Secured at: 22 cm Tube secured with: Tape Dental Injury: Teeth and Oropharynx as per pre-operative assessment

## 2022-07-09 NOTE — Brief Op Note (Signed)
07/09/2022  12:21 PM  PATIENT:  Rachael Stone  56 y.o. female  PRE-OPERATIVE DIAGNOSIS:  Aortic Insufficiency  POST-OPERATIVE DIAGNOSIS:  Aortic Insufficiency  PROCEDURE:  Procedure(s): AORTIC VALVE REPLACEMENT (AVR) USING 23MM INSPIRIS RESILIA AORTIC VALVE (N/A) TRANSESOPHAGEAL ECHOCARDIOGRAM (TEE) (N/A)  SURGEON:  Surgeon(s) and Role: [name], MD - Primary  PHYSICIAN ASSISTANT: Wynelle Beckmann PA-C  ASSISTANTS: Maryla Morrow RNFA   ANESTHESIA:   general  EBL: 251mL  BLOOD ADMINISTERED:none  DRAINS:  Mediastinal chest tubes    LOCAL MEDICATIONS USED:  NONE  SPECIMEN:  Source of Specimen:  Aortic valve leaflets  DISPOSITION OF SPECIMEN:  PATHOLOGY  COUNTS CORRECT:  YES  DICTATION: .Dragon Dictation  PLAN OF CARE: Admit to inpatient   PATIENT DISPOSITION:  ICU - intubated and hemodynamically stable.   Delay start of Pharmacological VTE agent (>24hrs) due to surgical blood loss or risk of bleeding: yes

## 2022-07-09 NOTE — Progress Notes (Signed)
      [address]       [place] [address]             [phone]       S/p AVR Intubated, sedated  BP 101/86   Pulse 96   Temp 97.7 F (36.5 C)   Resp (!) 24   Ht 5\' 5"  (1.651 m)   Wt 71.5 kg   LMP  (LMP Unknown)   SpO2 99%   BMI 26.23 kg/m  PRVC 24/60%/10 PEEP CI 1.6 (down form 2.6 on arrival)  Intake/Output Summary (Last 24 hours) at 07/09/2022 1750 Last data filed at 07/09/2022 1523 Gross per 24 hour  Intake 3129.41 ml  Output 4760 ml  Net -1630.59 ml   Hct 32 No recent lytes  Dobutamine started by CCM for low CI. Also has high SVR with mean 120- titrate meds  Remo Lipps C. Roxan Hockey, MD [facility] [phone]

## 2022-07-10 ENCOUNTER — Inpatient Hospital Stay (HOSPITAL_COMMUNITY): Payer: Commercial Managed Care - HMO

## 2022-07-10 DIAGNOSIS — J9601 Acute respiratory failure with hypoxia: Secondary | ICD-10-CM | POA: Diagnosis not present

## 2022-07-10 LAB — POCT I-STAT 7, (LYTES, BLD GAS, ICA,H+H)
Acid-base deficit: 1 mmol/L (ref 0.0–2.0)
Acid-base deficit: 1 mmol/L (ref 0.0–2.0)
Bicarbonate: 22.8 mmol/L (ref 20.0–28.0)
Bicarbonate: 23 mmol/L (ref 20.0–28.0)
Calcium, Ion: 1.15 mmol/L (ref 1.15–1.40)
Calcium, Ion: 1.16 mmol/L (ref 1.15–1.40)
HCT: 30 % — ABNORMAL LOW (ref 36.0–46.0)
HCT: 30 % — ABNORMAL LOW (ref 36.0–46.0)
Hemoglobin: 10.2 g/dL — ABNORMAL LOW (ref 12.0–15.0)
Hemoglobin: 10.2 g/dL — ABNORMAL LOW (ref 12.0–15.0)
O2 Saturation: 93 %
O2 Saturation: 94 %
Patient temperature: 37.3
Patient temperature: 37.7
Potassium: 3.9 mmol/L (ref 3.5–5.1)
Potassium: 3.8 mmol/L (ref 3.5–5.1)
Sodium: 140 mmol/L (ref 135–145)
Sodium: 140 mmol/L (ref 135–145)
TCO2: 24 mmol/L (ref 22–32)
TCO2: 24 mmol/L (ref 22–32)
pCO2 arterial: 33.1 mmHg (ref 32–48)
pCO2 arterial: 34.4 mmHg (ref 32–48)
pH, Arterial: 7.447 (ref 7.35–7.45)
pH, Arterial: 7.437 (ref 7.35–7.45)
pO2, Arterial: 63 mmHg — ABNORMAL LOW (ref 83–108)
pO2, Arterial: 72 mmHg — ABNORMAL LOW (ref 83–108)

## 2022-07-10 LAB — PROCALCITONIN: Procalcitonin: 0.21 ng/mL

## 2022-07-10 LAB — BASIC METABOLIC PANEL
Anion gap: 5 (ref 5–15)
Anion gap: 10 (ref 5–15)
BUN: 6 mg/dL (ref 6–20)
BUN: 7 mg/dL (ref 6–20)
CO2: 24 mmol/L (ref 22–32)
CO2: 21 mmol/L — ABNORMAL LOW (ref 22–32)
Calcium: 8 mg/dL — ABNORMAL LOW (ref 8.9–10.3)
Calcium: 8.4 mg/dL — ABNORMAL LOW (ref 8.9–10.3)
Chloride: 109 mmol/L (ref 98–111)
Chloride: 107 mmol/L (ref 98–111)
Creatinine, Ser: 0.61 mg/dL (ref 0.44–1.00)
Creatinine, Ser: 0.73 mg/dL (ref 0.44–1.00)
GFR, Estimated: >60 mL/min (ref >60)
GFR, Estimated: >60 mL/min (ref >60)
Glucose, Bld: 114 mg/dL — ABNORMAL HIGH (ref 70–99)
Glucose, Bld: 121 mg/dL — ABNORMAL HIGH (ref 70–99)
Potassium: 3.9 mmol/L (ref 3.5–5.1)
Potassium: 3.6 mmol/L (ref 3.5–5.1)
Sodium: 138 mmol/L (ref 135–145)
Sodium: 138 mmol/L (ref 135–145)

## 2022-07-10 LAB — CBC
HCT: 30.2 % — ABNORMAL LOW (ref 36.0–46.0)
HCT: 29.5 % — ABNORMAL LOW (ref 36.0–46.0)
Hemoglobin: 9.9 g/dL — ABNORMAL LOW (ref 12.0–15.0)
Hemoglobin: 10.1 g/dL — ABNORMAL LOW (ref 12.0–15.0)
MCH: 29.8 pg (ref 26.0–34.0)
MCH: 30.8 pg (ref 26.0–34.0)
MCHC: 32.8 g/dL (ref 30.0–36.0)
MCHC: 34.2 g/dL (ref 30.0–36.0)
MCV: 91 fL (ref 80.0–100.0)
MCV: 89.9 fL (ref 80.0–100.0)
Platelets: 103 10*3/uL — ABNORMAL LOW (ref 150–400)
Platelets: 106 10*3/uL — ABNORMAL LOW (ref 150–400)
RBC: 3.32 MIL/uL — ABNORMAL LOW (ref 3.87–5.11)
RBC: 3.28 MIL/uL — ABNORMAL LOW (ref 3.87–5.11)
RDW: 14.2 % (ref 11.5–15.5)
RDW: 14 % (ref 11.5–15.5)
WBC: 10.4 10*3/uL (ref 4.0–10.5)
WBC: 13.6 10*3/uL — ABNORMAL HIGH (ref 4.0–10.5)
nRBC: 0 % (ref 0.0–0.2)
nRBC: 0 % (ref 0.0–0.2)

## 2022-07-10 LAB — GLUCOSE, CAPILLARY
Glucose-Capillary: 112 mg/dL — ABNORMAL HIGH (ref 70–99)
Glucose-Capillary: 113 mg/dL — ABNORMAL HIGH (ref 70–99)
Glucose-Capillary: 116 mg/dL — ABNORMAL HIGH (ref 70–99)
Glucose-Capillary: 119 mg/dL — ABNORMAL HIGH (ref 70–99)
Glucose-Capillary: 123 mg/dL — ABNORMAL HIGH (ref 70–99)
Glucose-Capillary: 135 mg/dL — ABNORMAL HIGH (ref 70–99)
Glucose-Capillary: 143 mg/dL — ABNORMAL HIGH (ref 70–99)
Glucose-Capillary: 143 mg/dL — ABNORMAL HIGH (ref 70–99)

## 2022-07-10 LAB — MAGNESIUM
Magnesium: 2.3 mg/dL (ref 1.7–2.4)
Magnesium: 2 mg/dL (ref 1.7–2.4)

## 2022-07-10 LAB — SURGICAL PATHOLOGY

## 2022-07-10 LAB — ABO/RH: ABO/RH(D): AB POS

## 2022-07-10 MED ORDER — ENOXAPARIN SODIUM 40 MG/0.4ML IJ SOSY
40.0000 mg | PREFILLED_SYRINGE | INTRAMUSCULAR | Status: DC
  Administered 2022-07-10 – 2022-07-16 (×7): 40 mg via SUBCUTANEOUS
  Filled 2022-07-10 (×7): qty 0.4

## 2022-07-10 MED ORDER — FUROSEMIDE 10 MG/ML IJ SOLN
40.0000 mg | Freq: Once | INTRAMUSCULAR | Status: AC
  Administered 2022-07-10: 40 mg via INTRAVENOUS
  Filled 2022-07-10: qty 4

## 2022-07-10 MED ORDER — SODIUM CHLORIDE 0.9% FLUSH
10.0000 mL | Freq: Three times a day (TID) | INTRAVENOUS | Status: DC
  Administered 2022-07-10 – 2022-07-12 (×7): 10 mL via INTRAPLEURAL

## 2022-07-10 MED ORDER — GABAPENTIN 100 MG PO CAPS: 100.0000 mg | ORAL_CAPSULE | Freq: Two times a day (BID) | ORAL | Status: DC

## 2022-07-10 MED ORDER — POTASSIUM CHLORIDE CRYS ER 20 MEQ PO TBCR
20.0000 meq | EXTENDED_RELEASE_TABLET | ORAL | Status: AC
  Administered 2022-07-10 – 2022-07-11 (×3): 20 meq via ORAL
  Filled 2022-07-10 (×3): qty 1

## 2022-07-10 MED ORDER — GABAPENTIN 250 MG/5ML PO SOLN
100.0000 mg | Freq: Two times a day (BID) | ORAL | Status: DC
  Administered 2022-07-10 (×2): 100 mg
  Filled 2022-07-10 (×2): qty 2

## 2022-07-10 MED ORDER — INSULIN ASPART 100 UNIT/ML IJ SOLN
0.0000 [IU] | INTRAMUSCULAR | Status: DC
  Administered 2022-07-10 – 2022-07-13 (×10): 2 [IU] via SUBCUTANEOUS

## 2022-07-10 MED ORDER — IPRATROPIUM-ALBUTEROL 0.5-2.5 (3) MG/3ML IN SOLN: 3.0000 mL | Freq: Four times a day (QID) | RESPIRATORY_TRACT | Status: DC | PRN

## 2022-07-10 MED ORDER — ORAL CARE MOUTH RINSE: 15.0000 mL | OROMUCOSAL | Status: DC | PRN

## 2022-07-10 MED ORDER — POTASSIUM CHLORIDE 20 MEQ PO PACK
40.0000 meq | PACK | Freq: Once | ORAL | Status: AC
  Administered 2022-07-10: 40 meq
  Filled 2022-07-10: qty 2

## 2022-07-10 NOTE — Procedures (Signed)
Extubation Procedure Note  Patient Details:   Name: LIZABETH FELLNER DOB: 1966-02-04 MRN: [id number]   Airway Documentation:    Vent end date: 07/10/22 Vent end time: 1211   Evaluation  O2 sats: stable throughout Complications: No apparent complications Patient did tolerate procedure well. Bilateral Breath Sounds: Diminished   Yes  Patient extubated per order to 4L [place] with no apparent complications. Positive cuff leak was noted prior to extubation. Patient is alert and oriented, has strong cough, and is able to speak. Vitals are stable. RT will continue to monitor.   Freda Jaquith Clyda Greener 07/10/2022, 12:18 PM

## 2022-07-10 NOTE — Procedures (Signed)
Insertion of Chest Tube Procedure Note  Rachael Stone  [id number]  [birth year]  Date:07/10/22  Time:8:32 AM    Provider Performing: Clementeen Graham   Procedure: Chest Tube Insertion (47092)  Indication(s) Pneumothorax  Consent Unable to obtain consent due to emergent nature of procedure.  Anesthesia Topical only with 1% lidocaine    Time Out Verified patient identification, verified procedure, site/side was marked, verified correct patient position, special equipment/implants available, medications/allergies/relevant history reviewed, required imaging and test results available.   Sterile Technique Maximal sterile technique including full sterile barrier drape, hand hygiene, sterile gown, sterile gloves, mask, hair covering, sterile ultrasound probe cover (if used).   Procedure Description Ultrasound not used to identify appropriate pleural anatomy for placement and overlying skin marked. Area of placement cleaned and draped in sterile fashion.  A 14 French pigtail pleural catheter was placed into the left pleural space using Seldinger technique. Appropriate return of air was obtained.  The tube was connected to atrium and placed on -20 cm H2O wall suction.   Complications/Tolerance None; patient tolerated the procedure well. Chest X-ray is ordered to verify placement.   EBL Minimal  Specimen(s) none  Erick Colace ACNP-BC [place] Pager # [phone] OR # [phone] if no answer

## 2022-07-10 NOTE — Progress Notes (Signed)
     [address]       [place] [address]             [phone]       EVENING ROUNDS  Has been extubated now that left pneumothorax relieved with CT earlier this am Hemodynamically stable Less agitated

## 2022-07-10 NOTE — Anesthesia Postprocedure Evaluation (Signed)
Anesthesia Post Note  Patient: Rachael Stone  Procedure(s) Performed: AORTIC VALVE REPLACEMENT (AVR) USING 23MM INSPIRIS RESILIA AORTIC VALVE (Chest) TRANSESOPHAGEAL ECHOCARDIOGRAM (TEE)     Patient location during evaluation: SICU Anesthesia Type: General Level of consciousness: sedated Pain management: pain level controlled Vital Signs Assessment: post-procedure vital signs reviewed and stable Respiratory status: patient remains intubated per anesthesia plan Cardiovascular status: stable Postop Assessment: no apparent nausea or vomiting Anesthetic complications: no   No notable events documented.  Last Vitals:  Vitals:   07/10/22 0600 07/10/22 0700  BP: 115/83 118/83  Pulse: (!) 108 (!) 103  Resp: 20 (!) 29  Temp: 37.5 C 37.7 C  SpO2: 100% 98%    Last Pain:  Vitals:   07/10/22 0700  TempSrc:   PainSc: [place]

## 2022-07-10 NOTE — Care Management (Signed)
  Transition of Care (TOC) Screening Note   Patient Details  Name: Rachael Stone Date of Birth: 05-Jan-1966   Transition of Care [facility]) CM/SW Contact:    Bethena Roys, RN Phone Number: 07/10/2022, 3:06 PM    Transition of Care Department [facility]) has reviewed the patient and discussed patient in morning rounds. No TOC needs have been identified at this time. We will continue to monitor patient advancement through interdisciplinary progression rounds. If new patient transition needs arise, please place a TOC consult.

## 2022-07-10 NOTE — Progress Notes (Signed)
[address]       [place] [address]             [phone]                 1 Day Post-Op Procedure(s) (LRB): AORTIC VALVE REPLACEMENT (AVR) USING 23MM INSPIRIS RESILIA AORTIC VALVE (N/A) TRANSESOPHAGEAL ECHOCARDIOGRAM (TEE) (N/A)   Events: No events Agitated on the vent _______________________________________________________________ Vitals: BP 118/83   Pulse (!) 103   Temp 99.9 F (37.7 C)   Resp (!) 29   Ht 5\' 5"  (1.651 m)   Wt 69.7 kg   LMP  (LMP Unknown)   SpO2 98%   BMI 25.57 kg/m  Filed Weights   07/09/22 0542 07/09/22 1611 07/10/22 0428  Weight: 66.2 kg 71.5 kg 69.7 kg     - Neuro: alert NAD  - Cardiovascular: sinus  Drips: dob.  2.5 PAP: (18-52)/(7-30) 37/7 CVP:  [4 mmHg-25 mmHg] 10 mmHg PCWP:  [15 mmHg-16 mmHg] 15 mmHg CO:  [2.8 L/min-5.1 L/min] 5.1 L/min CI:  [1.6 L/min/m2-2.9 L/min/m2] 2.9 L/min/m2  - Pulm:  Vent Mode: PRVC FiO2 (%):  [45 %-80 %] 80 % Set Rate:  [12 bmp-24 bmp] 24 bmp Vt Set:  [450 mL] 450 mL PEEP:  [5 cmH20-10 cmH20] 10 cmH20 Pressure Support:  [10 cmH20] 10 cmH20 Plateau Pressure:  [19 cmH20-24 cmH20] 24 cmH20  ABG    Component Value Date/Time   PHART 7.437 07/10/2022 0656   PCO2ART 34.4 07/10/2022 0656   PO2ART 72 (L) 07/10/2022 0656   HCO3 23.0 07/10/2022 0656   TCO2 24 07/10/2022 0656   ACIDBASEDEF 1.0 07/10/2022 0656   O2SAT 94 07/10/2022 0656    - Abd: ND - Extremity: warm  .Intake/Output      12/27 0701 12/28 0700 12/28 0701 12/29 0700   I.V. (mL/kg) 3167.2 (45.4)    Blood 400    NG/GT 60    IV Piggyback 1698.1    Total Intake(mL/kg) 5325.3 (76.4)    Urine (mL/kg/hr) 4995 (3)    Emesis/NG output 200    Blood 1100    Chest Tube 320    Total Output 6615    Net -1289.7            _______________________________________________________________ Labs:    Latest Ref Rng & Units 07/10/2022    6:56 AM 07/10/2022    4:21 AM 07/10/2022    4:14 AM  CBC  WBC 4.0 - 10.5 K/uL   10.4    Hemoglobin 12.0 - 15.0 g/dL 10.2  9.9  10.2   Hematocrit 36.0 - 46.0 % 30.0  30.2  30.0   Platelets 150 - 400 K/uL  103        Latest Ref Rng & Units 07/10/2022    6:56 AM 07/10/2022    4:21 AM 07/10/2022    4:14 AM  CMP  Glucose 70 - 99 mg/dL  114    BUN 6 - 20 mg/dL  6    Creatinine 0.44 - 1.00 mg/dL  0.61    Sodium 135 - 145 mmol/L 140  138  140   Potassium 3.5 - 5.1 mmol/L 3.8  3.9  3.9   Chloride 98 - 111 mmol/L  109    CO2 22 - 32 mmol/L  24    Calcium 8.9 - 10.3 mg/dL  8.0      CXR: L pneumoT  _______________________________________________________________  Assessment and Plan: POD 1 s/p AVR  Neuro: weaning  sedation CV: stable. Pulm: will place pigtail chest tube.  Wean to extubate Renal: creat stable.  Good uop GI: NPO for now Heme: stable ID: [email or web address] Endo: SSI Dispo: continue ICU care   Graeme Menees O Lealand Elting 07/10/2022 8:21 AM

## 2022-07-10 NOTE — Consult Note (Signed)
NAME:  Rachael Stone, MRN:  [id number], DOB:  1965/10/14, LOS: 1 ADMISSION DATE:  07/09/2022, CONSULTATION DATE:  12/27 REFERRING MD:  lightfoot, CHIEF COMPLAINT:  post-op critical are    History of Present Illness:  56 year old female w/ hx as outlined below presents to CVICU post op on 12/27 s/p AVR on full vent support. PCCM asked to assist w/ post op vent and critical care management   Pertinent  Medical History  Severe AVI HFrEF (40-45%) Remote tobacco abuse  HTN Left upper lobe bx proven NSCLC  Significant Hospital Events: Including procedures, antibiotic start and stop dates in addition to other pertinent events   12/27 post-op AVR  12/28 spontaneous left pneumothorax left chest tube placed  Interim History / Subjective:  Restless this morning  Objective   Blood pressure 118/83, pulse (Abnormal) 103, temperature 99.9 F (37.7 C), resp. rate (Abnormal) 29, height 5\' 5"  (1.651 m), weight 69.7 kg, SpO2 98 %. PAP: (18-52)/(7-30) 37/7 CVP:  [4 mmHg-25 mmHg] 10 mmHg PCWP:  [15 mmHg-16 mmHg] 15 mmHg CO:  [2.8 L/min-5.1 L/min] 5.1 L/min CI:  [1.6 L/min/m2-2.9 L/min/m2] 2.9 L/min/m2  Vent Mode: PRVC FiO2 (%):  [45 %-80 %] 80 % Set Rate:  [12 bmp-24 bmp] 24 bmp Vt Set:  [450 mL] 450 mL PEEP:  [5 cmH20-10 cmH20] 10 cmH20 Pressure Support:  [10 cmH20] 10 cmH20 Plateau Pressure:  [19 cmH20-24 cmH20] 24 cmH20   Intake/Output Summary (Last 24 hours) at 07/10/2022 1937 Last data filed at 07/10/2022 0700 Gross per 24 hour  Intake 4975.27 ml  Output 6615 ml  Net -1639.73 ml   Filed Weights   07/09/22 0542 07/09/22 1611 07/10/22 0428  Weight: 66.2 kg 71.5 kg 69.7 kg    Examination: General 56 year old female patient agitated this morning, a little more tachypneic and anxious requiring frequent as needed benzodiazepines  HEENT normocephalic atraumatic orally intubated  Pulmonary diminished on the left prior to chest tube insertion, now equal breath signs bilaterally,  intermittent 1 out of 7 airleak from chest tube  Cardiac tachycardic rhythm sinus tach 113 no murmur rub or gallop  Abdomen soft nontender  Extremities are warm dry well-perfused brisk cap refill no significant edema  Neuro agitated at times, restless, moves all extremities.  No focal deficits appreciated.    Resolved Hospital Problem list     Assessment & Plan:  Severe AI now S/p AVR w/ resilia AV Plan Continuing routine postoperative care Cardene for hypertension Telemetry monitoring Weaning inotropic support per protocol Tight glycemic control  H/o HFrEF Plan Continue telemetry monitoring Holding [employer] ARB  Acute hypoxic respiratory failure secondary to mild pulmonary edema and left-sided spontaneous pneumothorax status post tube thoracostomy 12/28 Plan Wean FiO2 PEEP decreased to 5 Follow-up chest x-ray Chest tube 20 cm suction flush every 8 hours VAP bundle PAD protocol, RASS: 0 to -1 Scheduled bronchodilators Lasix x 1  Restlessness, anxiety, postoperative pain History of polysubstance abuse Plan Resuming her gabapentin to avoid withdrawal PAD protocol, RASS goal 0 to -1 Hopefully now that chest tube is placed sedation requirements will be less  Post-op anemia. Expected Received cell savor intra-op  Plan Continue to trend Trigger for transfusion is less than 7 and or acute blood loss  H/o NSCLC  Plan F/u ou pt   Best Practice (right click and "Reselect all SmartList Selections" daily)   Diet/type: NPO DVT prophylaxis: SCDSCD GI prophylaxis: PPI Lines: Central line Foley:  Yes, and it is still needed Code Status:  full code Last date of multidisciplinary goals of care discussion [NA]   Critical care time: 33 min     Erick Colace ACNP-BC [place] Pager # [phone] OR # [phone] if no answer

## 2022-07-11 ENCOUNTER — Encounter (HOSPITAL_COMMUNITY): Payer: Self-pay | Admitting: Thoracic Surgery (Cardiothoracic Vascular Surgery)

## 2022-07-11 ENCOUNTER — Inpatient Hospital Stay (HOSPITAL_COMMUNITY): Payer: Commercial Managed Care - HMO

## 2022-07-11 DIAGNOSIS — J9601 Acute respiratory failure with hypoxia: Secondary | ICD-10-CM | POA: Diagnosis not present

## 2022-07-11 LAB — CBC
HCT: 31 % — ABNORMAL LOW (ref 36.0–46.0)
Hemoglobin: 10.6 g/dL — ABNORMAL LOW (ref 12.0–15.0)
MCH: 30.8 pg (ref 26.0–34.0)
MCHC: 34.2 g/dL (ref 30.0–36.0)
MCV: 90.1 fL (ref 80.0–100.0)
Platelets: 123 10*3/uL — ABNORMAL LOW (ref 150–400)
RBC: 3.44 MIL/uL — ABNORMAL LOW (ref 3.87–5.11)
RDW: 14.2 % (ref 11.5–15.5)
WBC: 17.9 10*3/uL — ABNORMAL HIGH (ref 4.0–10.5)
nRBC: 0 % (ref 0.0–0.2)

## 2022-07-11 LAB — COMPREHENSIVE METABOLIC PANEL
ALT: 14 U/L (ref 0–44)
AST: 28 U/L (ref 15–41)
Albumin: 3.2 g/dL — ABNORMAL LOW (ref 3.5–5.0)
Alkaline Phosphatase: 48 U/L (ref 38–126)
Anion gap: 13 (ref 5–15)
BUN: 5 mg/dL — ABNORMAL LOW (ref 6–20)
CO2: 23 mmol/L (ref 22–32)
Calcium: 8.4 mg/dL — ABNORMAL LOW (ref 8.9–10.3)
Chloride: 101 mmol/L (ref 98–111)
Creatinine, Ser: 0.66 mg/dL (ref 0.44–1.00)
GFR, Estimated: >60 mL/min (ref >60)
Glucose, Bld: 131 mg/dL — ABNORMAL HIGH (ref 70–99)
Potassium: 3.5 mmol/L (ref 3.5–5.1)
Sodium: 137 mmol/L (ref 135–145)
Total Bilirubin: 1.1 mg/dL (ref 0.3–1.2)
Total Protein: 6.7 g/dL (ref 6.5–8.1)

## 2022-07-11 LAB — POCT I-STAT 7, (LYTES, BLD GAS, ICA,H+H)
Acid-base deficit: 1 mmol/L (ref 0.0–2.0)
Acid-base deficit: 1 mmol/L (ref 0.0–2.0)
Bicarbonate: 21.8 mmol/L (ref 20.0–28.0)
Bicarbonate: 22.5 mmol/L (ref 20.0–28.0)
Calcium, Ion: 1.15 mmol/L (ref 1.15–1.40)
Calcium, Ion: 1.13 mmol/L — ABNORMAL LOW (ref 1.15–1.40)
HCT: 30 % — ABNORMAL LOW (ref 36.0–46.0)
HCT: 32 % — ABNORMAL LOW (ref 36.0–46.0)
Hemoglobin: 10.2 g/dL — ABNORMAL LOW (ref 12.0–15.0)
Hemoglobin: 10.9 g/dL — ABNORMAL LOW (ref 12.0–15.0)
O2 Saturation: 82 %
O2 Saturation: 96 %
Patient temperature: 38.4
Patient temperature: 38.1
Potassium: 3.6 mmol/L (ref 3.5–5.1)
Potassium: 3.5 mmol/L (ref 3.5–5.1)
Sodium: 136 mmol/L (ref 135–145)
Sodium: 135 mmol/L (ref 135–145)
TCO2: 23 mmol/L (ref 22–32)
TCO2: 23 mmol/L (ref 22–32)
pCO2 arterial: 32.6 mmHg (ref 32–48)
pCO2 arterial: 34 mmHg (ref 32–48)
pH, Arterial: 7.438 (ref 7.35–7.45)
pH, Arterial: 7.433 (ref 7.35–7.45)
pO2, Arterial: 47 mmHg — ABNORMAL LOW (ref 83–108)
pO2, Arterial: 82 mmHg — ABNORMAL LOW (ref 83–108)

## 2022-07-11 LAB — GLUCOSE, CAPILLARY
Glucose-Capillary: 138 mg/dL — ABNORMAL HIGH (ref 70–99)
Glucose-Capillary: 115 mg/dL — ABNORMAL HIGH (ref 70–99)
Glucose-Capillary: 120 mg/dL — ABNORMAL HIGH (ref 70–99)
Glucose-Capillary: 124 mg/dL — ABNORMAL HIGH (ref 70–99)
Glucose-Capillary: 142 mg/dL — ABNORMAL HIGH (ref 70–99)

## 2022-07-11 LAB — ECHO INTRAOPERATIVE TEE
Height: 65 in
Weight: 2336 oz

## 2022-07-11 LAB — PROCALCITONIN: Procalcitonin: 12.75 ng/mL

## 2022-07-11 MED ORDER — HYDROMORPHONE HCL 1 MG/ML IJ SOLN
1.0000 mg | Freq: Four times a day (QID) | INTRAMUSCULAR | Status: DC | PRN
  Administered 2022-07-11 (×2): 1 mg via INTRAVENOUS
  Filled 2022-07-11: qty 2
  Filled 2022-07-11: qty 1

## 2022-07-11 MED ORDER — FUROSEMIDE 10 MG/ML IJ SOLN
INTRAMUSCULAR | Status: AC
  Filled 2022-07-11: qty 2

## 2022-07-11 MED ORDER — LIDOCAINE 5 % EX PTCH
2.0000 | MEDICATED_PATCH | CUTANEOUS | Status: DC
  Administered 2022-07-11 – 2022-07-17 (×7): 2 via TRANSDERMAL
  Filled 2022-07-11 (×7): qty 2

## 2022-07-11 MED ORDER — METOPROLOL TARTRATE 25 MG PO TABS
25.0000 mg | ORAL_TABLET | Freq: Two times a day (BID) | ORAL | Status: DC
  Administered 2022-07-11 – 2022-07-12 (×3): 25 mg via ORAL
  Filled 2022-07-11 (×3): qty 1

## 2022-07-11 MED ORDER — METHOCARBAMOL 500 MG PO TABS
500.0000 mg | ORAL_TABLET | Freq: Three times a day (TID) | ORAL | Status: AC
  Administered 2022-07-11 – 2022-07-15 (×15): 500 mg via ORAL
  Filled 2022-07-11 (×15): qty 1

## 2022-07-11 MED ORDER — GABAPENTIN 100 MG PO CAPS
200.0000 mg | ORAL_CAPSULE | Freq: Two times a day (BID) | ORAL | Status: DC
  Administered 2022-07-11 – 2022-07-17 (×13): 200 mg via ORAL
  Filled 2022-07-11 (×13): qty 2

## 2022-07-11 MED ORDER — FUROSEMIDE 10 MG/ML IJ SOLN
40.0000 mg | Freq: Once | INTRAMUSCULAR | Status: AC
  Administered 2022-07-11: 40 mg via INTRAVENOUS

## 2022-07-11 MED ORDER — NICARDIPINE HCL IN NACL 40-0.83 MG/200ML-% IV SOLN
3.0000 mg/h | INTRAVENOUS | Status: DC
  Administered 2022-07-11: 5 mg/h via INTRAVENOUS
  Filled 2022-07-11: qty 200

## 2022-07-11 NOTE — Progress Notes (Signed)
PT Cancellation Note  Patient Details Name: Rachael Stone MRN: [id number] DOB: 1966/06/04   Cancelled Treatment:    Reason Eval/Treat Not Completed: Patient not medically ready. Plan to remove pacing wires and chest tube this AM.  Pt currently on 30L HHFNC. Per cardiac order, OK for OOB starting 12/29 1800.    Lorriane Shire 07/11/2022, 10:40 AM

## 2022-07-11 NOTE — Progress Notes (Addendum)
NAME:  Rachael Stone, MRN:  [id number], DOB:  12/31/1965, LOS: 1 ADMISSION DATE:  07/09/2022, CONSULTATION DATE:  12/27 REFERRING MD:  lightfoot, CHIEF COMPLAINT:  post-op critical are    History of Present Illness:  56 year old female w/ hx as outlined below presents to CVICU post op on 12/27 s/p AVR on full vent support. PCCM asked to assist w/ post op vent and critical care management   Pertinent  Medical History  Severe AVI HFrEF (40-45%) Remote tobacco abuse  HTN Left upper lobe bx proven NSCLC  Significant Hospital Events: Including procedures, antibiotic start and stop dates in addition to other pertinent events   12/27 post-op AVR  12/28 spontaneous left pneumothorax left chest tube placed 12/29 increased O2 needs. Poor cough. Placed on high flow. Seemingly O2 needs a mix of pain causing ineffective cough and element of volume treated w/ heated high flow, pain rx and diuresis w/ what seems to be good effect   Interim History / Subjective:  better  Objective   Blood pressure 118/83, pulse (Abnormal) 103, temperature 99.9 F (37.7 C), resp. rate (Abnormal) 29, height 5\' 5"  (1.651 m), weight 69.7 kg, SpO2 98 %. PAP: (18-52)/(7-30) 37/7 CVP:  [4 mmHg-25 mmHg] 10 mmHg PCWP:  [15 mmHg-16 mmHg] 15 mmHg CO:  [2.8 L/min-5.1 L/min] 5.1 L/min CI:  [1.6 L/min/m2-2.9 L/min/m2] 2.9 L/min/m2  Vent Mode: PRVC FiO2 (%):  [45 %-80 %] 80 % Set Rate:  [12 bmp-24 bmp] 24 bmp Vt Set:  [450 mL] 450 mL PEEP:  [5 cmH20-10 cmH20] 10 cmH20 Pressure Support:  [10 cmH20] 10 cmH20 Plateau Pressure:  [19 cmH20-24 cmH20] 24 cmH20   Intake/Output Summary (Last 24 hours) at 07/10/2022 0821 Last data filed at 07/10/2022 0700 Gross per 24 hour  Intake 4975.27 ml  Output 6615 ml  Net -1639.73 ml   Filed Weights   07/09/22 0542 07/09/22 1611 07/10/22 0428  Weight: 66.2 kg 71.5 kg 69.7 kg    Examination: General 62 yof resting in bed. She is in no distress HENT NCAT no JVD Pulm decreased  bases. Some scattered rhonchi. No airleak. No airleak noted right chest Card rrr Abd soft Ext warm  Neuro intact    Resolved Hospital Problem list     Assessment & Plan:  Severe AI now S/p AVR w/ resilia AV Plan Continuing routine postoperative care Cardene for hypertension->weaning for SBP <140 Telemetry monitoring Tight glycemic control Pacer wire and sternal tube out today   H/o HFrEF Plan Continue telemetry monitoring Added scheduled lopressor  Acute hypoxic respiratory failure secondary to mild pulmonary edema and left-sided spontaneous pneumothorax status post tube thoracostomy 12/28 Plan Cont to wean FIO2 (currently heated high flow) IS  Mobilize Lasix administered.  Pain control (think hypoventilation and atx driving issue here)  Keep CT to 20 cm sxn, if no ptx or airleak changed to water seal Saturday  Am cxr  Restlessness, anxiety, postoperative pain History of polysubstance abuse Plan Added lidoderm patch  Inc'd Neurontin PRN dilaudid    Post-op anemia. Expected Received cell savor intra-op ->hgb has remained stable Plan Continue to trend Trigger for transfusion is less than 7 and or acute blood loss  H/o NSCLC  Plan F/u ou pt   Best Practice (right click and "Reselect all SmartList Selections" daily)   Diet/type: Regular consistency (see orders) DVT prophylaxis: SCDSCD GI prophylaxis: PPI Lines: Central line Foley:  Yes, and it is still needed Code Status:  full code Last date of multidisciplinary  goals of care discussion [NA]   Critical care time: 32 min    Erick Colace ACNP-BC [place] Pager # [phone] OR # [phone] if no answer

## 2022-07-11 NOTE — Discharge Summary (Signed)
[address]       [place] [address]             [phone]    Physician Discharge Summary  Patient ID: Rachael Stone MRN: [id number] DOB/AGE: [birth year] 56 y.o.  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   Sinus tachycardia 05/16/2021   Alcoholism ([place])    Hypokalemia    Hospital discharge follow-up    BV (bacterial vaginosis) 01/26/2017   Trichomonal infection 01/26/2017   Essential hypertension 11/14/2016   Normal coronary arteries 04/11/2016   Hypertensive heart disease with heart failure (HCC)    NICM (nonischemic cardiomyopathy) ([place])    Nonrheumatic aortic valve insufficiency    Moderate to Severe Mitral Regurgitation    Tobacco abuse    Acute pulmonary edema ([place]) 04/08/2016   Elevated blood pressure 04/08/2016   Dyspnea    VAGINAL DISCHARGE 09/10/2009   CHEST PAIN UNSPECIFIED 08/13/2009   DEPRESSIVE DISORDER NOT ELSEWHERE CLASSIFIED 12/14/2008   MENORRHAGIA 12/14/2008   NAUSEA, CHRONIC 12/14/2008   ELEVATED BLOOD PRESSURE WITHOUT DIAGNOSIS OF HYPERTENSION 12/14/2008   DYSFUNCTIONAL UTERINE BLEEDING 11/12/2007   TOBACCO DEPENDENCE 09/10/2006   GASTROESOPHAGEAL REFLUX, NO ESOPHAGITIS 09/10/2006     Discharge Diagnoses:  Patient Active Problem List   Diagnosis Date Noted   S/P AVR (aortic valve replacement) 07/09/2022   Loss of teeth due to extraction 07/01/2022    Encounter for preoperative dental examination 06/16/2022   Teeth missing 06/16/2022   Caries 06/16/2022   Defective dental restoration 06/16/2022   Accretions on teeth 06/16/2022   Chronic periodontitis 06/16/2022   Attrition, teeth excessive 06/16/2022   Phobia of dental procedure 06/16/2022   Gingival recession, generalized 06/16/2022   Nodule of upper lobe of left lung 03/31/2022   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/16/2021   Sinus tachycardia 05/16/2021   Alcoholism ([place])    Hypokalemia    Hospital discharge follow-up    BV (bacterial vaginosis) 01/26/2017   Trichomonal infection 01/26/2017   Essential hypertension 11/14/2016   Normal coronary arteries 04/11/2016   Hypertensive heart disease with heart failure (HCC)    NICM (nonischemic cardiomyopathy) (HCC)    Nonrheumatic aortic valve insufficiency    Moderate to Severe Mitral Regurgitation    Tobacco abuse    Acute pulmonary edema ([place]) 04/08/2016   Elevated blood pressure 04/08/2016   Dyspnea    VAGINAL DISCHARGE 09/10/2009   CHEST PAIN UNSPECIFIED 08/13/2009   DEPRESSIVE DISORDER NOT ELSEWHERE CLASSIFIED 12/14/2008   MENORRHAGIA 12/14/2008   NAUSEA, CHRONIC 12/14/2008   ELEVATED BLOOD PRESSURE WITHOUT DIAGNOSIS OF HYPERTENSION 12/14/2008   DYSFUNCTIONAL UTERINE BLEEDING 11/12/2007   TOBACCO DEPENDENCE 09/10/2006   GASTROESOPHAGEAL REFLUX, NO ESOPHAGITIS 09/10/2006     Discharged Condition: stable  History of Present Illness:     Rachael Stone is a 56 year old female presents for surgical evaluation of severe aortic valve insufficiency. She was also previously seen for a left upper lobe  biopsy-proven non-small cell lung cancer.  She continues to smoke and admits to exertional shortness of breath.  She remains very hesitant to proceed with any surgical procedures.  Dr. [name] reviewed the patient's studies and determined surgical intervention would provide the patient the best long term treatment. He  discussed her treatment options as well as the risks and benefits of surgery. Rachael Stone was agreeable to proceed with surgery.  Hospital Course: Rachael Stone arrived at [facility] and was brought to the operating room on 07/10/22. She underwent an aortic valve replacement utilizing a 51mm Inspiris Resilia Aortic valve, she tolerated the procedure well and was transferred to the SICU in stable condition. She was extubated on POD1 without complication. She was noted to have a left pneumothorax on POD1 and pigtail chest tube was placed.  She was weaned off Cardene as BP allowed.  She was resumed on home Coreg and Norvasc.  The patient's pacing wires were removed without difficulty.  She was started on diuretics for volume overloaded state.  Her mediastinal chest tubes were removed without difficulty.  Follow up CXR showed no evidence of pneumothorax.  Her previously placed pigtail catheter was removed on 07/12/2022.  She was transferred to the floor on 07/14/2022. Irbesartan was started for continued hypertension. She was weaned off oxygen as tolerated. She was started on lasix for mild volume overload. She became tachycardiac and hypertensive, it was believed she was volume contracted so Lasix was discontinued. CXR was obtained and showed mild bibasilar atelectasis. Her hypertension improved but tachycardia persisted so coreg was increased for better heart rate control.    Consults: cardiology  Significant Diagnostic Studies:   TRANSESOPHOGEAL ECHO REPORT       Patient Name:   Rachael Stone Date of Exam: 05/12/2022 Medical Rec #:  [id number]         Height:       65.0 in Accession #:    [id number]        Weight:       130.6 lb Date of Birth:  05/04/1966         BSA:          1.650 m Patient Age:    17 years          BP:           113/66 mmHg Patient Gender: F                 HR:           116 bpm. Exam Location:  Inpatient  Procedure: Transesophageal Echo, Color Doppler, Cardiac Doppler,  3D Echo and            Saline Contrast Bubble Study  Indications:    AI/Pre-procedural CV examination   History:        Patient has prior history of Echocardiogram examinations, most                 recent 11/13/2021. Cardiomyopathy, Aortic Valve Disease and Mitral                 Valve Disease, Signs/Symptoms:Hypertensive Heart Disease and                 Dyspnea; Risk Factors:Current Smoker, Hypertension and ETOH.                 Marijuana use.   Sonographer:    Eartha Inch Referring Phys: [phone] PHILIP J NAHSER  PROCEDURE: After discussion of the  risks and benefits of a TEE, an informed consent was obtained from the patient. TEE procedure time was 22 minutes. The transesophogeal probe was passed without difficulty through the esophogus of the patient. Imaged were obtained with the patient in a left lateral decubitus position. Local oropharyngeal anesthetic was provided with Cetacaine. Sedation performed by different physician. The patient was monitored while under deep sedation. Anesthestetic sedation was provided intravenously by Anesthesiology: 500mg  of Propofol, 60mg  of Lidocaine. Image quality was excellent. The patient's vital signs; including heart rate, blood pressure, and oxygen saturation; remained stable throughout the procedure. The patient developed no complications during the procedure. 20 mcg Precedex.  IMPRESSIONS    1. Tricuspid aortic valve with severe AI. Leaflet tips are thickened and retracted with incomplete leaflet coaptation creating a central regurgitant jet. Tachycardia was present which can affect quantitation. 2D ERO by PISA 0.46 cm2, R vol 61 cc. 2D VC 0.52. Holodiastolic flow reversal is present in the descending aorta. LVEF reduced 40-45%. Mildly dilated LV cavity. All findings consistent with severe AI. The aortic valve is tricuspid. There is mild thickening of the aortic valve. Aortic valve regurgitation is severe. No aortic stenosis is  present.  2. Left ventricular ejection fraction, by estimation, is 40 to 45%. The left ventricle has mildly decreased function. The left ventricle demonstrates global hypokinesis. The left ventricular internal cavity size was mildly dilated.  3. Right ventricular systolic function is normal. The right ventricular size is normal. Tricuspid regurgitation signal is inadequate for assessing PA pressure.  4. No left atrial/left atrial appendage thrombus was detected.  5. The mitral valve is grossly normal. Mild mitral valve regurgitation. No evidence of mitral stenosis.  6. Agitated saline contrast bubble study was negative, with no evidence of any interatrial shunt.  FINDINGS  Left Ventricle: Left ventricular ejection fraction, by estimation, is 40 to 45%. The left ventricle has mildly decreased function. The left ventricle demonstrates global hypokinesis. The left ventricular internal cavity size was mildly dilated.  Right Ventricle: The right ventricular size is normal. No increase in right ventricular wall thickness. Right ventricular systolic function is normal. Tricuspid regurgitation signal is inadequate for assessing PA pressure.  Left Atrium: Left atrial size was normal in size. No left atrial/left atrial appendage thrombus was detected.  Right Atrium: Right atrial size was normal in size.  Pericardium: There is no evidence of pericardial effusion.  Mitral Valve: The mitral valve is grossly normal. Mild mitral valve regurgitation. No evidence of mitral valve stenosis.  Tricuspid Valve: The tricuspid valve is normal in structure. Tricuspid valve regurgitation is trivial. No evidence of tricuspid stenosis.  Aortic Valve: Tricuspid aortic valve with severe AI. Leaflet tips are thickened and retracted with incomplete leaflet coaptation creating a central regurgitant jet. Tachycardia was present which can affect quantitation. 2D ERO by PISA 0.46 cm2, R vol 61 cc. 2D VC 0.52.  Holodiastolic flow reversal is present in the descending aorta. LVEF reduced 40-45%. Mildly dilated LV cavity. All findings consistent with severe AI. The aortic valve is tricuspid. There is mild thickening of the aortic valve. Aortic valve regurgitation is severe. No aortic stenosis is present. Aortic valve mean gradient measures 8.0 mmHg. Aortic valve peak gradient measures 13.7 mmHg. Aortic valve area, by VTI measures 2.32 cm.  Pulmonic Valve: The pulmonic valve was grossly normal. Pulmonic valve regurgitation is trivial. No evidence of pulmonic stenosis.  Aorta: The aortic root and ascending aorta are structurally normal, with no evidence of dilitation.  Venous: The left upper pulmonary  vein, left lower pulmonary vein, right lower pulmonary vein and right upper pulmonary vein are normal.  IAS/Shunts: No atrial level shunt detected by color flow Doppler. Agitated saline contrast was given intravenously to evaluate for intracardiac shunting. Agitated saline contrast bubble study was negative, with no evidence of any interatrial shunt.    LEFT VENTRICLE PLAX 2D LVOT diam:     2.10 cm LV SV:         81 LV SV Index:   49 LVOT Area:     3.46 cm    AORTIC VALVE AV Area (Vmax):    2.30 cm AV Area (Vmean):   2.19 cm AV Area (VTI):     2.32 cm AV Vmax:           185.00 cm/s AV Vmean:          137.000 cm/s AV VTI:            0.350 m AV Peak Grad:      13.7 mmHg AV Mean Grad:      8.0 mmHg LVOT Vmax:         123.00 cm/s LVOT Vmean:        86.700 cm/s LVOT VTI:          0.234 m LVOT/AV VTI ratio: 0.67   AORTA Ao Root diam: 3.30 cm Ao Asc diam:  3.40 cm    SHUNTS Systemic VTI:  0.23 m Systemic Diam: 2.10 cm  Eleonore Chiquito MD Electronically signed by Eleonore Chiquito MD Signature Date/Time: 05/12/2022/9:46:43 AM       Final     Treatments: surgery:  07/09/2022 Patient:  Rachael Stone Pre-Op Dx: Severe aortic valve insufficiency Congestive heart  failure NSCLC   Post-op Dx:  same Procedure: Aortic valve replacement with a 79mm Inspiris valve       Surgeon and Role:      * Lightfoot, Lucile Crater, MD - Primary    * B. Stehler, PA-C  An experienced assistant was required given the complexity of this surgery and the standard of surgical care. The assistant was needed for exposure, dissection, suctioning, retraction of delicate tissues and sutures, instrument exchange and for overall help during this procedure.     Discharge Exam: Blood pressure (!) 134/92, pulse (!) 123, temperature 98.2 F (36.8 C), temperature source Oral, resp. rate 17, height 5\' 5"  (1.651 m), weight 62.6 kg, SpO2 99 %.  General appearance: alert, cooperative, and no distress Neurologic: intact Heart: sinus tachycardia, no murmur Lungs: clear to auscultation bilaterally Abdomen: soft, non-tender; bowel sounds normal; no masses,  no organomegaly Extremities: extremities earm and welll perfused without edema.  Wound: Clean and dry, no erythema or sign of infection   Discharge Medications:  The patient has been discharged on:   1.Beta Blocker:  Yes [  x ]                              No   [   ]                              If No, reason:  2.Ace Inhibitor/ARB: Yes [ x  ]                                     No  [    ]  If No, reason:  3.Statin:   Yes [   ]                  No  [  x ]                  If No, reason: No hx of HLD or CAD  4.[place]:  Yes  [  x ]                  No   [   ]                  If No, reason:  Patient had ACS upon admission: No  Plavix/P2Y12 inhibitor: Yes [   ]                                      No  [ x  ]     Discharge Instructions     Amb Referral to Cardiac Rehabilitation   Complete by: As directed    Diagnosis: Valve Replacement   Valve: Aortic   After initial evaluation and assessments completed: Virtual Based Care may be provided alone or in conjunction with Phase 2  Cardiac Rehab based on patient barriers.: Yes   Intensive Cardiac Rehabilitation (ICR) [place] location only OR Traditional Cardiac Rehabilitation (TCR) *If criteria for ICR are not met will enroll in TCR [facility] only): Yes      Allergies as of 07/17/2022       Reactions   Ibuprofen Other (See Comments)   Irritates stomach        Medication List     STOP taking these medications    losartan 25 MG tablet Commonly known as: COZAAR       TAKE these medications    acetaminophen 500 MG tablet Commonly known as: TYLENOL Take 500 mg by mouth every 8 (eight) hours as needed for moderate pain.   amLODipine-olmesartan 5-20 MG tablet Commonly known as: AZOR Take 1 tablet by mouth daily.   aspirin EC 81 MG tablet Take 81 mg by mouth 2 (two) times a week. Swallow whole.   carvedilol 25 MG tablet Commonly known as: COREG Take 1 tablet (25 mg total) by mouth 2 (two) times daily with a meal. What changed:  medication strength how much to take when to take this   cyanocobalamin 1000 MCG tablet Commonly known as: VITAMIN B12 Take 1,000 mcg by mouth daily.   furosemide 20 MG tablet Commonly known as: LASIX Take 1 tablet (20 mg total) by mouth daily.   gabapentin 100 MG capsule Commonly known as: NEURONTIN Take 100 mg by mouth in the morning and at bedtime.   ICY HOT EX Apply 1 Application topically daily as needed (pain).   metroNIDAZOLE 0.75 % vaginal gel Commonly known as: METROGEL Place 1 Applicatorful vaginally at bedtime.   nicotine 7 mg/24hr patch Commonly known as: NICODERM CQ - dosed in mg/24 hr Place 1 patch (7 mg total) onto the skin daily.   potassium chloride 10 MEQ tablet Commonly known as: KLOR-CON Take 1 tablet (10 mEq total) by mouth daily.   traMADol 50 MG tablet Commonly known as: ULTRAM Take 1 tablet (50 mg total) by mouth every 6 (six) hours as needed for up to 7 days for moderate pain.   WOMENS MULTI PO Take 1 tablet by mouth 4 (four) times a  week.               Durable Medical Equipment  (From admission, onward)           Start     Ordered   07/15/22 1402  For home use only DME Walker rolling  Once       Question Answer Comment  Walker: With [place] Whe[employer]   Patient needs a walker to treat with the following condition S/P aortic valve replacement   Patient needs a walker to treat with the following condition Generalized weakness      07/15/22 1402   07/14/22 0846  For home use only DME Walker rolling  Once       Question Answer Comment  Walker: With [place]   Patient needs a walker to treat with the following condition S/P AVR      07/14/22 0845            Follow-up Information     [name], MD Follow up on 07/25/2022.   Specialty: Cardiothoracic Surgery Why: Virtual appointment with Dr. [name] at 2:50PM. Do NOT go to the office as this is a virtual appointment, Dr. [name] will call you. Contact information: [place] [address] [phone]         Nahser, Wonda Cheng, MD Follow up on 07/23/2022.   Specialty: Cardiology Why: Cardiology follow up appointment at 10:20AM. Contact information: [address] [place] [address] [place]. Follow up.   Why: [place] referral arranged with office protocol pre-op they will contact you to schedule Contact information: [place] [address] [place] Follow up on 08/14/2022.   Specialty: Cardiology Why: To get echocardiogram at 9:30AM Contact information: [address] [id number] [place] [phone]                Signed:  Antony Odea, PA-C  07/17/2022, 8:22 AM

## 2022-07-11 NOTE — Progress Notes (Addendum)
eLink Physician-Brief Progress Note Patient Name: Rachael Stone DOB: May 08, 1966 MRN: [id number]   Date of Service  07/11/2022  HPI/Events of Note  Patient with hypoxemic respiratory failure, she was extubated earlier today and also had a chest tube placed, she is s/p CABG.  eICU Interventions  Stat portable CXR ordered to r/o re-accumulation of pneumothorax, and patient placed on HHFNC, PCCM ground crew requested to evaluate patient at bedside for possible re-intubation.        Kerry Kass Silvia Hightower 07/11/2022, 3:18 AM

## 2022-07-11 NOTE — Progress Notes (Signed)
[address]       [place] [address]             [phone]                 2 Days Post-Op Procedure(s) (LRB): AORTIC VALVE REPLACEMENT (AVR) USING 23MM INSPIRIS RESILIA AORTIC VALVE (N/A) TRANSESOPHAGEAL ECHOCARDIOGRAM (TEE) (N/A)   Events: Some hypoxia overnight _______________________________________________________________ Vitals: BP 123/74   Pulse (!) 106   Temp 99.7 F (37.6 C)   Resp (!) 21   Ht 5\' 5"  (1.651 m)   Wt 68 kg   LMP  (LMP Unknown)   SpO2 100%   BMI 24.95 kg/m  Filed Weights   07/09/22 1611 07/10/22 0428 07/11/22 0500  Weight: 71.5 kg 69.7 kg 68 kg     - Neuro: alert NAD  - Cardiovascular: sinus tach  Drips: cardeme PAP: (24-33)/(12-22) 24/12 CVP:  [7 mmHg-17 mmHg] 7 mmHg  - Pulm:  On heated HF.  Breathing comfortably SS CT output  ABG    Component Value Date/Time   PHART 7.433 07/11/2022 0438   PCO2ART 34.0 07/11/2022 0438   PO2ART 82 (L) 07/11/2022 0438   HCO3 22.5 07/11/2022 0438   TCO2 23 07/11/2022 0438   ACIDBASEDEF 1.0 07/11/2022 0438   O2SAT 96 07/11/2022 0438    - Abd: ND - Extremity: warm  .Intake/Output      12/28 0701 12/29 0700 12/29 0701 12/30 0700   P.O. 360    I.V. (mL/kg) 1674.8 (24.6)    Blood     NG/GT 100    IV Piggyback 300.1    Chest Tube 10    Total Intake(mL/kg) 2444.9 (36)    Urine (mL/kg/hr) 3340 (2)    Emesis/NG output 250    Blood     Chest Tube 458    Total Output 4048    Net -1603.1            _______________________________________________________________ Labs:    Latest Ref Rng & Units 07/11/2022    4:38 AM 07/11/2022    4:16 AM 07/11/2022    3:02 AM  CBC  WBC 4.0 - 10.5 K/uL  17.9    Hemoglobin 12.0 - 15.0 g/dL 10.9  10.6  10.2   Hematocrit 36.0 - 46.0 % 32.0  31.0  30.0   Platelets 150 - 400 K/uL  123        Latest Ref Rng & Units 07/11/2022    4:38 AM 07/11/2022    4:16 AM 07/11/2022    3:02 AM  CMP  Glucose 70 - 99 mg/dL  131    BUN 6 - 20  mg/dL  5    Creatinine 0.44 - 1.00 mg/dL  0.66    Sodium 135 - 145 mmol/L 135  137  136   Potassium 3.5 - 5.1 mmol/L 3.5  3.5  3.6   Chloride 98 - 111 mmol/L  101    CO2 22 - 32 mmol/L  23    Calcium 8.9 - 10.3 mg/dL  8.4    Total Protein 6.5 - 8.1 g/dL  6.7    Total Bilirubin 0.3 - 1.2 mg/dL  1.1    Alkaline Phos 38 - 126 U/L  48    AST 15 - 41 U/L  28    ALT 0 - 44 U/L  14      CXR: L pneumoT resolved  _______________________________________________________________  Assessment and Plan: POD 2 s/p AVR  Neuro:  weaning sedation CV: stable.  Adding BB.  Will remove wires.  Wean cardene Pulm: continue diuresis.  Will remove mediastinal tubes, and keep pigtail Renal: continue diuresis GI: on diet Heme: stable ID: [email or web address] Endo: SSI Dispo: continue ICU care   Rachael Stone 07/11/2022 8:47 AM

## 2022-07-11 NOTE — Progress Notes (Incomplete)
Physician Discharge Summary  Patient ID: Rachael Stone MRN: [id number] DOB/AGE: [birth year] 56 y.o.  Admit date: 07/09/2022 Discharge date: 07/11/2022  Admission Diagnoses:  Patient Active Problem List   Diagnosis Date Noted   Loss of teeth due to extraction 07/01/2022   Encounter for preoperative dental examination 06/16/2022   Teeth missing 06/16/2022   Caries 06/16/2022   Defective dental restoration 06/16/2022   Accretions on teeth 06/16/2022   Chronic periodontitis 06/16/2022   Attrition, teeth excessive 06/16/2022   Phobia of dental procedure 06/16/2022   Gingival recession, generalized 06/16/2022   Nodule of upper lobe of left lung 03/31/2022   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/16/2021   Sinus tachycardia 05/16/2021   Alcoholism ([place])    Hypokalemia    Hospital discharge follow-up    BV (bacterial vaginosis) 01/26/2017   Trichomonal infection 01/26/2017   Essential hypertension 11/14/2016   Normal coronary arteries 04/11/2016   Hypertensive heart disease with heart failure (HCC)    NICM (nonischemic cardiomyopathy) ([place])    Nonrheumatic aortic valve insufficiency    Moderate to Severe Mitral Regurgitation    Tobacco abuse    Acute pulmonary edema ([place]) 04/08/2016   Elevated blood pressure 04/08/2016   Dyspnea    VAGINAL DISCHARGE 09/10/2009   CHEST PAIN UNSPECIFIED 08/13/2009   DEPRESSIVE DISORDER NOT ELSEWHERE CLASSIFIED 12/14/2008   MENORRHAGIA 12/14/2008   NAUSEA, CHRONIC 12/14/2008   ELEVATED BLOOD PRESSURE WITHOUT DIAGNOSIS OF HYPERTENSION 12/14/2008   DYSFUNCTIONAL UTERINE BLEEDING 11/12/2007   TOBACCO DEPENDENCE 09/10/2006   GASTROESOPHAGEAL REFLUX, NO ESOPHAGITIS 09/10/2006   Discharge Diagnoses:   Patient Active Problem List   Diagnosis Date Noted   S/P AVR (aortic valve replacement) 07/09/2022   Loss of teeth due to extraction 07/01/2022   Encounter for preoperative dental examination 06/16/2022   Teeth missing 06/16/2022   Caries  06/16/2022   Defective dental restoration 06/16/2022   Accretions on teeth 06/16/2022   Chronic periodontitis 06/16/2022   Attrition, teeth excessive 06/16/2022   Phobia of dental procedure 06/16/2022   Gingival recession, generalized 06/16/2022   Nodule of upper lobe of left lung 03/31/2022   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/16/2021   Sinus tachycardia 05/16/2021   Alcoholism ([place])    Hypokalemia    Hospital discharge follow-up    BV (bacterial vaginosis) 01/26/2017   Trichomonal infection 01/26/2017   Essential hypertension 11/14/2016   Normal coronary arteries 04/11/2016   Hypertensive heart disease with heart failure (HCC)    NICM (nonischemic cardiomyopathy) (HCC)    Nonrheumatic aortic valve insufficiency    Moderate to Severe Mitral Regurgitation    Tobacco abuse    Acute pulmonary edema ([place]) 04/08/2016   Elevated blood pressure 04/08/2016   Dyspnea    VAGINAL DISCHARGE 09/10/2009   CHEST PAIN UNSPECIFIED 08/13/2009   DEPRESSIVE DISORDER NOT ELSEWHERE CLASSIFIED 12/14/2008   MENORRHAGIA 12/14/2008   NAUSEA, CHRONIC 12/14/2008   ELEVATED BLOOD PRESSURE WITHOUT DIAGNOSIS OF HYPERTENSION 12/14/2008   DYSFUNCTIONAL UTERINE BLEEDING 11/12/2007   TOBACCO DEPENDENCE 09/10/2006   GASTROESOPHAGEAL REFLUX, NO ESOPHAGITIS 09/10/2006   Discharged Condition: {condition:18240}  History of Present Illness:     Rachael Stone is a 56 year old female presents for surgical evaluation of severe aortic valve insufficiency. She was also previously seen for a left upper lobe biopsy-proven non-small cell lung cancer.  She continues to smoke and admits to exertional shortness of breath.  She remains very hesitant to proceed with any surgical procedures.  Dr. [name] reviewed the patient's studies and  determined surgical intervention would provide the patient the best long term treatment. He discussed her treatment options as well as the risks and benefits of surgery. Rachael Stone was  agreeable to proceed with surgery.  Hospital Course: Rachael Stone arrived at [facility] and was brought to the operating room on 07/10/22. She underwent an aortic valve replacement utilizing a 36mm Inspiris Resilia Aortic valve, she tolerated the procedure well and was transferred to the SICU in stable condition. She was extubated on POD1 without complication. She was noted to have a left pneumothorax on POD1 and pigtail chest tube was placed.  She was weaned off Cardene as BP allowed.  The patient's pacing wires were removed without difficulty.  She was started on diuretics for volume overloaded state.  Her mediastinal chest tubes were removed without difficulty.  Consults: {consultation:18241}  Significant Diagnostic Studies: cardiac graphics:  IMPRESSIONS   1. Tricuspid aortic valve with severe AI. Leaflet tips are thickened and  retracted with incomplete leaflet coaptation creating a central  regurgitant jet. Tachycardia was present which can affect quantitation. 2D  ERO by PISA 0.46 cm2, R vol 61 cc. 2D VC  0.52. Holodiastolic flow reversal is present in the descending aorta. LVEF  reduced 40-45%. Mildly dilated LV cavity. All findings consistent with  severe AI. The aortic valve is tricuspid. There is mild thickening of the  aortic valve. Aortic valve  regurgitation is severe. No aortic stenosis is present.   2. Left ventricular ejection fraction, by estimation, is 40 to 45%. The  left ventricle has mildly decreased function. The left ventricle  demonstrates global hypokinesis. The left ventricular internal cavity size  was mildly dilated.   3. Right ventricular systolic function is normal. The right ventricular  size is normal. Tricuspid regurgitation signal is inadequate for assessing  PA pressure.   4. No left atrial/left atrial appendage thrombus was detected.   5. The mitral valve is grossly normal. Mild mitral valve regurgitation.  No evidence of mitral stenosis.   6.  Agitated saline contrast bubble study was negative, with no evidence  of any interatrial shunt.   Treatments: surgery:   07/09/2022 Patient:  Rachael Stone Pre-Op Dx: Severe aortic valve insufficiency Congestive heart failure NSCLC   Post-op Dx:  same Procedure: Aortic valve replacement with a 46mm Inspiris valve      Surgeon and Role:      * Lightfoot, Lucile Crater, MD - Primary    * B. Stehler, PA-C  An experienced assistant was required given the complexity of this surgery and the standard of surgical care. The assistant was needed for exposure, dissection, suctioning, retraction of delicate tissues and sutures, instrument exchange and for overall help during this procedure.      PATHOLOGY:  Discharge Exam: Blood pressure 139/64, pulse (!) 108, temperature 99.5 F (37.5 C), resp. rate (!) 22, height 5\' 5"  (1.651 m), weight 68 kg, SpO2 100 %. {physical WJXB:[id number]}  Disposition:    Allergies as of 07/11/2022       Reactions   Ibuprofen Other (See Comments)   Irritates stomach     Med Rec must be completed prior to using this [facility]***       Follow-up Information     [name], MD Follow up.   Specialty: Cardiothoracic Surgery Why: Virtual appointment with Dr. [name] at *** do NOT go to the office as this is a virtual appointment, Dr. [name] will call you. Contact information: [address]  [place] [address] [phone]                 Signed: Ellwood Handler, PA-C  07/11/2022, 9:45 AM

## 2022-07-12 ENCOUNTER — Inpatient Hospital Stay (HOSPITAL_COMMUNITY): Payer: Commercial Managed Care - HMO

## 2022-07-12 DIAGNOSIS — J9601 Acute respiratory failure with hypoxia: Secondary | ICD-10-CM | POA: Diagnosis not present

## 2022-07-12 LAB — BASIC METABOLIC PANEL
Anion gap: 11 (ref 5–15)
Anion gap: 8 (ref 5–15)
BUN: 11 mg/dL (ref 6–20)
BUN: 8 mg/dL (ref 6–20)
CO2: 24 mmol/L (ref 22–32)
CO2: 23 mmol/L (ref 22–32)
Calcium: 8.6 mg/dL — ABNORMAL LOW (ref 8.9–10.3)
Calcium: 8 mg/dL — ABNORMAL LOW (ref 8.9–10.3)
Chloride: 99 mmol/L (ref 98–111)
Chloride: 100 mmol/L (ref 98–111)
Creatinine, Ser: 0.6 mg/dL (ref 0.44–1.00)
Creatinine, Ser: 0.56 mg/dL (ref 0.44–1.00)
GFR, Estimated: >60 mL/min (ref >60)
GFR, Estimated: >60 mL/min (ref >60)
Glucose, Bld: 125 mg/dL — ABNORMAL HIGH (ref 70–99)
Glucose, Bld: 101 mg/dL — ABNORMAL HIGH (ref 70–99)
Potassium: 3.5 mmol/L (ref 3.5–5.1)
Potassium: 3.4 mmol/L — ABNORMAL LOW (ref 3.5–5.1)
Sodium: 134 mmol/L — ABNORMAL LOW (ref 135–145)
Sodium: 131 mmol/L — ABNORMAL LOW (ref 135–145)

## 2022-07-12 LAB — CBC
HCT: 29.9 % — ABNORMAL LOW (ref 36.0–46.0)
Hemoglobin: 9.8 g/dL — ABNORMAL LOW (ref 12.0–15.0)
MCH: 30.5 pg (ref 26.0–34.0)
MCHC: 32.8 g/dL (ref 30.0–36.0)
MCV: 93.1 fL (ref 80.0–100.0)
Platelets: 137 10*3/uL — ABNORMAL LOW (ref 150–400)
RBC: 3.21 MIL/uL — ABNORMAL LOW (ref 3.87–5.11)
RDW: 14.2 % (ref 11.5–15.5)
WBC: 15.4 10*3/uL — ABNORMAL HIGH (ref 4.0–10.5)
nRBC: 0.1 % (ref 0.0–0.2)

## 2022-07-12 LAB — GLUCOSE, CAPILLARY
Glucose-Capillary: 102 mg/dL — ABNORMAL HIGH (ref 70–99)
Glucose-Capillary: 105 mg/dL — ABNORMAL HIGH (ref 70–99)
Glucose-Capillary: 111 mg/dL — ABNORMAL HIGH (ref 70–99)
Glucose-Capillary: 113 mg/dL — ABNORMAL HIGH (ref 70–99)
Glucose-Capillary: 119 mg/dL — ABNORMAL HIGH (ref 70–99)
Glucose-Capillary: 128 mg/dL — ABNORMAL HIGH (ref 70–99)
Glucose-Capillary: 140 mg/dL — ABNORMAL HIGH (ref 70–99)

## 2022-07-12 LAB — PROCALCITONIN: Procalcitonin: 13.42 ng/mL

## 2022-07-12 LAB — BRAIN NATRIURETIC PEPTIDE: B Natriuretic Peptide: 531.1 pg/mL — ABNORMAL HIGH (ref 0.0–100.0)

## 2022-07-12 MED ORDER — CARVEDILOL 3.125 MG PO TABS
3.1250 mg | ORAL_TABLET | Freq: Two times a day (BID) | ORAL | Status: DC
  Administered 2022-07-12: 3.125 mg via ORAL
  Filled 2022-07-12: qty 1

## 2022-07-12 MED ORDER — CARVEDILOL 3.125 MG PO TABS
3.1250 mg | ORAL_TABLET | Freq: Once | ORAL | Status: AC
  Administered 2022-07-12: 3.125 mg via ORAL

## 2022-07-12 MED ORDER — FUROSEMIDE 10 MG/ML IJ SOLN
40.0000 mg | Freq: Once | INTRAMUSCULAR | Status: AC
  Administered 2022-07-13: 40 mg via INTRAVENOUS
  Filled 2022-07-12: qty 4

## 2022-07-12 MED ORDER — NICOTINE 7 MG/24HR TD PT24
7.0000 mg | MEDICATED_PATCH | Freq: Every day | TRANSDERMAL | Status: DC
  Administered 2022-07-12 – 2022-07-17 (×6): 7 mg via TRANSDERMAL
  Filled 2022-07-12 (×7): qty 1

## 2022-07-12 MED ORDER — AMLODIPINE BESYLATE 10 MG PO TABS
10.0000 mg | ORAL_TABLET | Freq: Every day | ORAL | Status: DC
  Administered 2022-07-13 – 2022-07-17 (×5): 10 mg via ORAL
  Filled 2022-07-12 (×5): qty 1

## 2022-07-12 MED ORDER — CARVEDILOL 6.25 MG PO TABS
6.2500 mg | ORAL_TABLET | Freq: Two times a day (BID) | ORAL | Status: DC
  Administered 2022-07-13 – 2022-07-14 (×3): 6.25 mg via ORAL
  Filled 2022-07-12 (×4): qty 1

## 2022-07-12 MED ORDER — AMLODIPINE BESYLATE 5 MG PO TABS
5.0000 mg | ORAL_TABLET | Freq: Every day | ORAL | Status: DC
  Administered 2022-07-12: 5 mg via ORAL
  Filled 2022-07-12: qty 1

## 2022-07-12 MED ORDER — POTASSIUM CHLORIDE CRYS ER 20 MEQ PO TBCR
20.0000 meq | EXTENDED_RELEASE_TABLET | ORAL | Status: AC
  Administered 2022-07-12 (×3): 20 meq via ORAL
  Filled 2022-07-12 (×3): qty 1

## 2022-07-12 NOTE — Evaluation (Signed)
Physical Therapy Evaluation Patient Details Name: Rachael Stone MRN: [id number] DOB: 1965-09-10 Today's Date: 07/12/2022  History of Present Illness  Pt is a 56 y.o. female admitted 07/09/22 for same day AVR. Pt with acute hypoxic respiratory failure post-op requiring intubation. ETT 12/27-12/28. CXR 12/18 with pneumothorax s/p chest tube placement. PMH includes HTN, COPD, cardiomyopathy, L lung adenocarcinoma, arthritis, polysubstance use, back pain, depression.  Clinical Impression  Pt admitted s/p procedure listed above. Pt overall presents with good pain control and is motivated to participate. Pt ambulating limited room distances with supervision; able to perform serial sit to stands (10 repetitions) for functional strengthening. SpO2 83-96% (poor pleth) on HHFNC, HR 99-113 bpm. Demonstrates good adherence to sternal precautions. Suspect pt will progress well with ambulation once weaned off HHFNC. Will continue to follow acutely to progress mobility as tolerated.     Recommendations for follow up therapy are one component of a multi-disciplinary discharge planning process, led by the attending physician.  Recommendations may be updated based on patient status, additional functional criteria and insurance authorization.  Follow Up Recommendations Other (comment) (OP Cardiac Rehab)      Assistance Recommended at Discharge PRN  Patient can return home with the following  Assistance with cooking/housework;Help with stairs or ramp for entrance;Assist for transportation    Equipment Recommendations None recommended by PT  Recommendations for Other Services       Functional Status Assessment Patient has had a recent decline in their functional status and demonstrates the ability to make significant improvements in function in a reasonable and predictable amount of time.     Precautions / Restrictions Precautions Precautions: Other (comment);Sternal Precaution Comments:  [place] Restrictions Weight Bearing Restrictions: Yes (sternal precautions)      Mobility  Bed Mobility Overal bed mobility: Modified Independent                  Transfers Overall transfer level: Needs assistance Equipment used: None Transfers: Sit to/from Stand Sit to Stand: Supervision                Ambulation/Gait Ambulation/Gait assistance: Supervision Gait Distance (Feet): 3 Feet Assistive device: None Gait Pattern/deviations: WFL(Within Functional Limits)       General Gait Details: Supervision for safety and line management  Stairs            Wheelchair Mobility    Modified Rankin (Stroke Patients Only)       Balance Overall balance assessment: Mild deficits observed, not formally tested                                           Pertinent Vitals/Pain Pain Assessment Pain Assessment: Faces Faces Pain Scale: Hurts a little bit Pain Location: incisional Pain Descriptors / Indicators: Operative site guarding Pain Intervention(s): Monitored during session    Home Living Family/patient expects to be discharged to:: Private residence Living Arrangements: Spouse/significant other Available Help at Discharge: Other (Comment) (significant other) Type of Home: House Home Access: Stairs to enter   [employer] of Steps: 4   Home Layout: One level   Additional Comments: Plans to move shortly after discharging    Prior Function Prior Level of Function : Independent/Modified Independent             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 [place]  Extremity/Trunk Assessment   Upper Extremity Assessment Upper Extremity Assessment: Overall WFL for tasks assessed    Lower Extremity Assessment Lower Extremity Assessment: Overall WFL for tasks assessed    Cervical / Trunk Assessment Cervical / Trunk Assessment: Normal  Communication   Communication: No  difficulties  Cognition Arousal/Alertness: Awake/alert Behavior During Therapy: WFL for tasks assessed/performed Overall Cognitive Status: Within Functional Limits for tasks assessed                                          General Comments      Exercises Other Exercises Other Exercises: x10 serial sit to stands   Assessment/Plan    PT Assessment Patient needs continued PT services  PT Problem List Decreased activity tolerance;Decreased mobility;Cardiopulmonary status limiting activity       PT Treatment Interventions Gait training;Stair training;Functional mobility training;Therapeutic activities;Therapeutic exercise;Balance training;Patient/family education    PT Goals (Current goals can be found in the Care Plan section)  Acute Rehab PT Goals Patient Stated Goal: to walk PT Goal Formulation: With patient Time For Goal Achievement: 07/26/22 Potential to Achieve Goals: Good    Frequency Min 3X/week     Co-evaluation               AM-PAC PT "6 Clicks" Mobility  Outcome Measure Help needed turning from your back to your side while in a flat bed without using bedrails?: None Help needed moving from lying on your back to sitting on the side of a flat bed without using bedrails?: None Help needed moving to and from a bed to a chair (including a wheelchair)?: A Little Help needed standing up from a chair using your arms (e.g., wheelchair or bedside chair)?: A Little Help needed to walk in hospital room?: A Little Help needed climbing 3-5 steps with a railing? : A Little 6 Click Score: 20    End of Session   Activity Tolerance: Patient tolerated treatment well Patient left: with call bell/phone within reach;in bed Nurse Communication: Mobility status PT Visit Diagnosis: Difficulty in walking, not elsewhere classified (R26.2);Pain Pain - part of body:  (chest)    Time: 1022-1039 PT Time Calculation (min) (ACUTE ONLY): 17 min   Charges:   PT  Evaluation $PT Eval Moderate Complexity: 1 Mod          Wyona Almas, PT, DPT Acute Rehabilitation Services Office [phone]   Deno Etienne 07/12/2022, 3:51 PM

## 2022-07-12 NOTE — Progress Notes (Signed)
[address]       [place] [address]             [phone]                 3 Days Post-Op Procedure(s) (LRB): AORTIC VALVE REPLACEMENT (AVR) USING 23MM INSPIRIS RESILIA AORTIC VALVE (N/A) TRANSESOPHAGEAL ECHOCARDIOGRAM (TEE) (N/A)   Events: No events _______________________________________________________________ Vitals: BP (!) 142/91 (BP Location: Right Arm)   Pulse (!) 107   Temp 98.5 F (36.9 C) (Oral)   Resp (!) 27   Ht 5\' 5"  (1.651 m)   Wt 67.4 kg   LMP  (LMP Unknown)   SpO2 93%   BMI 24.73 kg/m  Filed Weights   07/10/22 0428 07/11/22 0500 07/12/22 0500  Weight: 69.7 kg 68 kg 67.4 kg     - Neuro: alert NAD  - Cardiovascular: sinus tach  Drips: none    - Pulm:  On heated HF.  Breathing comfortably SS CT output  ABG    Component Value Date/Time   PHART 7.433 07/11/2022 0438   PCO2ART 34.0 07/11/2022 0438   PO2ART 82 (L) 07/11/2022 0438   HCO3 22.5 07/11/2022 0438   TCO2 23 07/11/2022 0438   ACIDBASEDEF 1.0 07/11/2022 0438   O2SAT 96 07/11/2022 0438    - Abd: ND - Extremity: warm  .Intake/Output      12/29 0701 12/30 0700 12/30 0701 12/31 0700   P.O. 480    I.V. (mL/kg) 70.9 (1.1)    NG/GT     IV Piggyback     Chest Tube 10    Total Intake(mL/kg) 560.9 (8.3)    Urine (mL/kg/hr) 700 (0.4)    Emesis/NG output     Stool 0    Chest Tube 180 50   Total Output 880 50   Net -319.1 -50        Urine Occurrence 3 x    Stool Occurrence 1 x       _______________________________________________________________ Labs:    Latest Ref Rng & Units 07/12/2022    3:06 AM 07/11/2022    4:38 AM 07/11/2022    4:16 AM  CBC  WBC 4.0 - 10.5 K/uL 15.4   17.9   Hemoglobin 12.0 - 15.0 g/dL 9.8  10.9  10.6   Hematocrit 36.0 - 46.0 % 29.9  32.0  31.0   Platelets 150 - 400 K/uL 137   123       Latest Ref Rng & Units 07/12/2022    3:06 AM 07/11/2022    4:38 AM 07/11/2022    4:16 AM  CMP  Glucose 70 - 99 mg/dL 125   131    BUN 6 - 20 mg/dL 11   5   Creatinine 0.44 - 1.00 mg/dL 0.60   0.66   Sodium 135 - 145 mmol/L 134  135  137   Potassium 3.5 - 5.1 mmol/L 3.5  3.5  3.5   Chloride 98 - 111 mmol/L 99   101   CO2 22 - 32 mmol/L 24   23   Calcium 8.9 - 10.3 mg/dL 8.6   8.4   Total Protein 6.5 - 8.1 g/dL   6.7   Total Bilirubin 0.3 - 1.2 mg/dL   1.1   Alkaline Phos 38 - 126 U/L   48   AST 15 - 41 U/L   28   ALT 0 - 44 U/L   14     CXR: -  _______________________________________________________________  Assessment and Plan: POD 3 s/p AVR  Neuro: weaning sedation CV: stable.  Switching to coreg.  Adding amlodipine Pulm: continue diuresis.  Will remove pigtail.  IS, ambulation Renal: continue diuresis GI: on diet Heme: stable ID: [email or web address] Endo: SSI Dispo: continue ICU care   [place] 07/12/2022 10:06 AM

## 2022-07-12 NOTE — Progress Notes (Signed)
   NAME:  Rachael Stone, MRN:  [id number], DOB:  1966-04-29, LOS: 3 ADMISSION DATE:  07/09/2022, CONSULTATION DATE:  12/27 REFERRING MD:  lightfoot, CHIEF COMPLAINT:  post-op critical are    History of Present Illness:  56 year old female w/ hx as outlined below presents to CVICU post op on 12/27 s/p AVR on full vent support. PCCM asked to assist w/ post op vent and critical care management   Pertinent  Medical History  Severe AVI HFrEF (40-45%) Remote tobacco abuse  HTN Left upper lobe bx proven NSCLC  Significant Hospital Events: Including procedures, antibiotic start and stop dates in addition to other pertinent events   12/27 post-op AVR  12/28 spontaneous left pneumothorax left chest tube placed 12/29 increased O2 needs. Poor cough. Placed on high flow. Seemingly O2 needs a mix of pain causing ineffective cough and element of volume treated w/ heated high flow, pain rx and diuresis w/ what seems to be good effect  12/30 chest tubes removed   Interim History / Subjective:  Better pain control.  Weaning O2  Objective   Blood pressure (!) 153/88, pulse (!) 112, temperature 98.5 F (36.9 C), temperature source Oral, resp. rate (!) 29, height 5\' 5"  (1.651 m), weight 67.4 kg, SpO2 91 %.    FiO2 (%):  [46 %-55 %] 55 %   Intake/Output Summary (Last 24 hours) at 07/12/2022 1457 Last data filed at 07/12/2022 1000 Gross per 24 hour  Intake 490 ml  Output 800 ml  Net -310 ml    Filed Weights   07/10/22 0428 07/11/22 0500 07/12/22 0500  Weight: 69.7 kg 68 kg 67.4 kg    Examination: General 42 yof resting in bed. She is in no distress HENT NCAT no JVD Pulm decreased bases. No wheezing. . Chest tubes out.  Card rrr Abd soft Ext warm  Neuro intact    Ancillary tests personally reviewed:  Na: 134 BNP 531 WBC 15.4 PLT 137  Assessment & Plan:  Severe AI now S/p AVR w/ resilia AV - Progressive ambulation  - Continue multimodal pain control as still having some end  inspiratory splinting.  H/o HFrEF and hypertension -Restarted home amlodipine and Coreg -Diurese again tomorrow  Acute hypoxic respiratory failure secondary to mild pulmonary edema and left-sided spontaneous pneumothorax status post tube thoracostomy 12/28  -Cont to wean FIO2 (currently heated high flow) - IS  -Ambulate today  Restlessness, anxiety, postoperative pain History of polysubstance abuse -Added lidoderm patch  -Inc'd Neurontin -PRN dilaudid   -Transdermal nicotine.  Post-op anemia. Expected -Trigger for transfusion is less than 7 and or acute blood loss  H/o NSCLC  -F/u out pt   Best Practice (right click and "Reselect all SmartList Selections" daily)   Diet/type: Regular consistency (see orders) DVT prophylaxis: Lovenox GI prophylaxis: PPI Lines: Removal ordered Foley:  Yes, and it is still needed Code Status:  full code Last date of multidisciplinary goals of care discussion [NA]  [name], MD [facility] ICU Physician [place]  Pager: [phone] Or Epic Secure Chat After hours: [phone].  07/12/2022, 3:24 PM

## 2022-07-13 DIAGNOSIS — J9601 Acute respiratory failure with hypoxia: Secondary | ICD-10-CM | POA: Diagnosis not present

## 2022-07-13 LAB — BASIC METABOLIC PANEL
Anion gap: 10 (ref 5–15)
BUN: 8 mg/dL (ref 6–20)
CO2: 24 mmol/L (ref 22–32)
Calcium: 8.5 mg/dL — ABNORMAL LOW (ref 8.9–10.3)
Chloride: 100 mmol/L (ref 98–111)
Creatinine, Ser: 0.66 mg/dL (ref 0.44–1.00)
GFR, Estimated: >60 mL/min (ref >60)
Glucose, Bld: 98 mg/dL (ref 70–99)
Potassium: 3.4 mmol/L — ABNORMAL LOW (ref 3.5–5.1)
Sodium: 134 mmol/L — ABNORMAL LOW (ref 135–145)

## 2022-07-13 LAB — GLUCOSE, CAPILLARY
Glucose-Capillary: 118 mg/dL — ABNORMAL HIGH (ref 70–99)
Glucose-Capillary: 108 mg/dL — ABNORMAL HIGH (ref 70–99)
Glucose-Capillary: 132 mg/dL — ABNORMAL HIGH (ref 70–99)
Glucose-Capillary: 118 mg/dL — ABNORMAL HIGH (ref 70–99)
Glucose-Capillary: 122 mg/dL — ABNORMAL HIGH (ref 70–99)
Glucose-Capillary: 128 mg/dL — ABNORMAL HIGH (ref 70–99)

## 2022-07-13 MED ORDER — SODIUM CHLORIDE 0.9 % IV SOLN: 250.0000 mL | INTRAVENOUS | Status: DC | PRN

## 2022-07-13 MED ORDER — SODIUM CHLORIDE 0.9% FLUSH: 3.0000 mL | Freq: Two times a day (BID) | INTRAVENOUS | Status: DC

## 2022-07-13 MED ORDER — POTASSIUM CHLORIDE CRYS ER 20 MEQ PO TBCR
20.0000 meq | EXTENDED_RELEASE_TABLET | ORAL | Status: AC
  Administered 2022-07-13 (×3): 20 meq via ORAL
  Filled 2022-07-13 (×3): qty 1

## 2022-07-13 MED ORDER — SODIUM CHLORIDE 0.9% FLUSH: 3.0000 mL | INTRAVENOUS | Status: DC | PRN

## 2022-07-13 MED ORDER — ~~LOC~~ CARDIAC SURGERY, PATIENT & FAMILY EDUCATION: Freq: Once | Status: DC

## 2022-07-13 NOTE — Progress Notes (Signed)
   NAME:  Rachael Stone, MRN:  [id number], DOB:  February 10, 1966, LOS: 4 ADMISSION DATE:  07/09/2022, CONSULTATION DATE:  12/27 REFERRING MD:  lightfoot, CHIEF COMPLAINT:  post-op critical are    History of Present Illness:  56 year old female w/ hx as outlined below presents to CVICU post op on 12/27 s/p AVR on full vent support. PCCM asked to assist w/ post op vent and critical care management   Pertinent  Medical History  Severe AVI HFrEF (40-45%) Remote tobacco abuse  HTN Left upper lobe bx proven NSCLC  Significant Hospital Events: Including procedures, antibiotic start and stop dates in addition to other pertinent events   12/27 post-op AVR  12/28 spontaneous left pneumothorax left chest tube placed 12/29 increased O2 needs. Poor cough. Placed on high flow. Seemingly O2 needs a mix of pain causing ineffective cough and element of volume treated w/ heated high flow, pain rx and diuresis w/ what seems to be good effect  12/30 chest tubes removed   Interim History / Subjective:  Better pain control.  Weaning O2.  Ambulating  Objective   Blood pressure 129/89, pulse 99, temperature 99 F (37.2 C), temperature source Oral, resp. rate (!) 30, height 5\' 5"  (1.651 m), weight 66 kg, SpO2 93 %.    FiO2 (%):  [40 %-55 %] 40 %   Intake/Output Summary (Last 24 hours) at 07/13/2022 1222 Last data filed at 07/13/2022 1000 Gross per 24 hour  Intake 730 ml  Output 2150 ml  Net -1420 ml    Filed Weights   07/11/22 0500 07/12/22 0500 07/13/22 0500  Weight: 68 kg 67.4 kg 66 kg    Examination: General 51 yof sitting in chair she is in no distress HENT NCAT no JVD Pulm decreased bases. No wheezing. . Chest tubes out.  Card rrr Abd soft Ext warm  Neuro intact    Ancillary tests personally reviewed:  Na: 131 Potassium 3.4  Assessment & Plan:  Severe AI now S/p AVR w/ resilia AV - Progressive ambulation  - Continue multimodal pain control  H/o HFrEF and  hypertension -Restarted home amlodipine and Coreg -Appears euvolemic  Acute hypoxic respiratory failure secondary to mild pulmonary edema and left-sided spontaneous pneumothorax status post tube thoracostomy 12/28 -Cont to wean FIO2 (currently heated high flow) - IS  -Ambulate today  Restlessness, anxiety, postoperative pain History of polysubstance abuse -Continue lidoderm patch  -Continue Neurontin -PRN dilaudid   -Transdermal nicotine.  Post-op anemia. Expected -Trigger for transfusion is less than 7 and or acute blood loss  H/o NSCLC  -F/u out pt   Ready for transfer.  Best Practice (right click and "Reselect all SmartList Selections" daily)   Diet/type: Regular consistency (see orders) DVT prophylaxis: Lovenox GI prophylaxis: PPI Lines: Removal ordered Foley:  Yes, and it is still needed Code Status:  full code Last date of multidisciplinary goals of care discussion [NA]  [name], MD [facility] ICU Physician [place]  Pager: [phone] Or Epic Secure Chat After hours: [phone].  07/13/2022, 12:22 PM

## 2022-07-13 NOTE — Progress Notes (Signed)
      [address]       [place] [address]             [phone]                 4 Days Post-Op Procedure(s) (LRB): AORTIC VALVE REPLACEMENT (AVR) USING 23MM INSPIRIS RESILIA AORTIC VALVE (N/A) TRANSESOPHAGEAL ECHOCARDIOGRAM (TEE) (N/A)   Events: No events _______________________________________________________________ Vitals: BP (!) 136/96   Pulse (!) 105   Temp 98.3 F (36.8 C) (Oral)   Resp 19   Ht 5\' 5"  (1.651 m)   Wt 66 kg   LMP  (LMP Unknown)   SpO2 93%   BMI 24.21 kg/m  Filed Weights   07/11/22 0500 07/12/22 0500 07/13/22 0500  Weight: 68 kg 67.4 kg 66 kg     - Neuro: alert NAD  - Cardiovascular: sinus tach  Drips: none    - Pulm:  EWOB  ABG    Component Value Date/Time   PHART 7.433 07/11/2022 0438   PCO2ART 34.0 07/11/2022 0438   PO2ART 82 (L) 07/11/2022 0438   HCO3 22.5 07/11/2022 0438   TCO2 23 07/11/2022 0438   ACIDBASEDEF 1.0 07/11/2022 0438   O2SAT 96 07/11/2022 0438    - Abd: ND - Extremity: warm  .Intake/Output      12/30 0701 12/31 0700 12/31 0701 01/01 0700   P.O. 720 250   I.V. (mL/kg)     Chest Tube     Total Intake(mL/kg) 720 (10.9) 250 (3.8)   Urine (mL/kg/hr) 1400 (0.9) 750 (3.6)   Stool     Chest Tube 50    Total Output 1450 750   Net -730 -500           _______________________________________________________________ Labs:    Latest Ref Rng & Units 07/12/2022    3:06 AM 07/11/2022    4:38 AM 07/11/2022    4:16 AM  CBC  WBC 4.0 - 10.5 K/uL 15.4   17.9   Hemoglobin 12.0 - 15.0 g/dL 9.8  10.9  10.6   Hematocrit 36.0 - 46.0 % 29.9  32.0  31.0   Platelets 150 - 400 K/uL 137   123       Latest Ref Rng & Units 07/12/2022    5:40 PM 07/12/2022    3:06 AM 07/11/2022    4:38 AM  CMP  Glucose 70 - 99 mg/dL 101  125    BUN 6 - 20 mg/dL 8  11    Creatinine 0.44 - 1.00 mg/dL 0.56  0.60    Sodium 135 - 145 mmol/L 131  134  135   Potassium 3.5 - 5.1 mmol/L 3.4  3.5  3.5   Chloride 98 - 111  mmol/L 100  99    CO2 22 - 32 mmol/L 23  24    Calcium 8.9 - 10.3 mg/dL 8.0  8.6      CXR: -  _______________________________________________________________  Assessment and Plan: POD 4 s/p AVR  Neuro: pain controlled CV: stable.  Titrating meds Pulm: continue diuresis.  IS, ambulation Renal: continue diuresis GI: on diet Heme: stable ID: [email or web address] Endo: SSI Dispo: floor   Deshana Rominger O Janera Peugh 07/13/2022 10:12 AM

## 2022-07-14 LAB — CBC
HCT: 27.6 % — ABNORMAL LOW (ref 36.0–46.0)
Hemoglobin: 9.2 g/dL — ABNORMAL LOW (ref 12.0–15.0)
MCH: 30 pg (ref 26.0–34.0)
MCHC: 33.3 g/dL (ref 30.0–36.0)
MCV: 89.9 fL (ref 80.0–100.0)
Platelets: 189 10*3/uL (ref 150–400)
RBC: 3.07 MIL/uL — ABNORMAL LOW (ref 3.87–5.11)
RDW: 13.5 % (ref 11.5–15.5)
WBC: 8.4 10*3/uL (ref 4.0–10.5)
nRBC: 0 % (ref 0.0–0.2)

## 2022-07-14 LAB — BASIC METABOLIC PANEL
Anion gap: 14 (ref 5–15)
BUN: 6 mg/dL (ref 6–20)
CO2: 21 mmol/L — ABNORMAL LOW (ref 22–32)
Calcium: 8.5 mg/dL — ABNORMAL LOW (ref 8.9–10.3)
Chloride: 100 mmol/L (ref 98–111)
Creatinine, Ser: 0.51 mg/dL (ref 0.44–1.00)
GFR, Estimated: >60 mL/min (ref >60)
Glucose, Bld: 103 mg/dL — ABNORMAL HIGH (ref 70–99)
Potassium: 3.8 mmol/L (ref 3.5–5.1)
Sodium: 135 mmol/L (ref 135–145)

## 2022-07-14 LAB — GLUCOSE, CAPILLARY
Glucose-Capillary: 99 mg/dL (ref 70–99)
Glucose-Capillary: 105 mg/dL — ABNORMAL HIGH (ref 70–99)
Glucose-Capillary: 105 mg/dL — ABNORMAL HIGH (ref 70–99)

## 2022-07-14 MED ORDER — IRBESARTAN 150 MG PO TABS
75.0000 mg | ORAL_TABLET | Freq: Every day | ORAL | Status: DC
  Administered 2022-07-14: 75 mg via ORAL
  Filled 2022-07-14: qty 1

## 2022-07-14 MED ORDER — FUROSEMIDE 40 MG PO TABS
40.0000 mg | ORAL_TABLET | Freq: Every day | ORAL | Status: DC
  Administered 2022-07-14: 40 mg via ORAL
  Filled 2022-07-14: qty 1

## 2022-07-14 MED ORDER — CARVEDILOL 12.5 MG PO TABS
12.5000 mg | ORAL_TABLET | Freq: Two times a day (BID) | ORAL | Status: DC
  Administered 2022-07-14 – 2022-07-15 (×3): 12.5 mg via ORAL
  Filled 2022-07-14 (×3): qty 1

## 2022-07-14 MED ORDER — POTASSIUM CHLORIDE CRYS ER 20 MEQ PO TBCR
20.0000 meq | EXTENDED_RELEASE_TABLET | Freq: Every day | ORAL | Status: DC
  Administered 2022-07-14: 20 meq via ORAL
  Filled 2022-07-14: qty 1

## 2022-07-14 NOTE — Progress Notes (Addendum)
      [address]       [employer] [address]             [phone]      5 Days Post-Op Procedure(s) (LRB): AORTIC VALVE REPLACEMENT (AVR) USING 23MM INSPIRIS RESILIA AORTIC VALVE (N/A) TRANSESOPHAGEAL ECHOCARDIOGRAM (TEE) (N/A)  Subjective:  Patient is short of breath.  + ambulation   + BM  Objective: Vital signs in last 24 hours: Temp:  [97.7 F (36.5 C)-99 F (37.2 C)] 98.3 F (36.8 C) (01/01 0837) Pulse Rate:  [83-112] 109 (01/01 0837) Cardiac Rhythm: Sinus tachycardia (01/01 0700) Resp:  [16-30] 20 (01/01 0837) BP: (128-149)/(80-98) 145/93 (01/01 0837) SpO2:  [92 %-100 %] 95 % (01/01 0837) Weight:  [64.5 kg] 64.5 kg (12/31 1404)  Intake/Output from previous day: 12/31 0701 - 01/01 0700 In: 490 [P.O.:490] Out: 1050 [Urine:1050]  General appearance: alert, cooperative, and no distress Heart: regular rate and rhythm Lungs: clear to auscultation bilaterally Abdomen: soft, non-tender; bowel sounds normal; no masses,  no organomegaly Extremities: extremities normal, atraumatic, no cyanosis or edema Wound: clean and dry  Lab Results: Recent Labs    07/12/22 0306 07/14/22 0106  WBC 15.4* 8.4  HGB 9.8* 9.2*  HCT 29.9* 27.6*  PLT 137* 189   BMET:  Recent Labs    07/13/22 1449 07/14/22 0106  NA 134* 135  K 3.4* 3.8  CL 100 100  CO2 24 21*  GLUCOSE 98 103*  BUN 8 6  CREATININE 0.66 0.51  CALCIUM 8.5* 8.5*    PT/INR: No results for input(s): "LABPROT", "INR" in the last 72 hours. ABG    Component Value Date/Time   PHART 7.433 07/11/2022 0438   HCO3 22.5 07/11/2022 0438   TCO2 23 07/11/2022 0438   ACIDBASEDEF 1.0 07/11/2022 0438   O2SAT 96 07/11/2022 0438   CBG (last 3)  Recent Labs    07/13/22 2013 07/13/22 2312 07/14/22 0331  GLUCAP 122* 128* 99    Assessment/Plan: S/P Procedure(s) (LRB): AORTIC VALVE REPLACEMENT (AVR) USING 23MM INSPIRIS RESILIA AORTIC VALVE (N/A) TRANSESOPHAGEAL ECHOCARDIOGRAM (TEE) (N/A)  CV- NSR,  H/O HTN- continue Coreg, Norvasc, resume home Olmesartan at a reduced dose Pulm- wean oxygen as tolerated, continue IS Renal- creatinine has been stable, weight is trending down, start lasix, potassium CBGs controlled, not a diabetic, will d/c SSIP Dispo- patient stable, weaning oxygen as tolerated, resume Olemsartan for additional BP control, plan for d/c in AM if off oxygen   LOS: 5 days   Ellwood Handler, PA-C 07/14/2022  Agree with above Weaning O2 IS, ambulation  Rachael Stone O Mena Simonis

## 2022-07-15 ENCOUNTER — Other Ambulatory Visit: Payer: Self-pay | Admitting: Cardiology

## 2022-07-15 DIAGNOSIS — Z952 Presence of prosthetic heart valve: Secondary | ICD-10-CM

## 2022-07-15 MED ORDER — IRBESARTAN 150 MG PO TABS
150.0000 mg | ORAL_TABLET | Freq: Every day | ORAL | Status: DC
  Administered 2022-07-15 – 2022-07-17 (×3): 150 mg via ORAL
  Filled 2022-07-15 (×3): qty 1

## 2022-07-15 NOTE — TOC Progression Note (Signed)
Transition of Care [facility]) - Progression Note    Patient Details  Name: Rachael Stone MRN: [id number] Date of Birth: February 19, 1966  [facility] CM/SW Contact  Levonne Lapping, RN Phone Number: 07/15/2022, 2:06 PM  Clinical Narrative:    Met with Patient bedside to discuss DC recommendations.  Patient will DC to home with Husband. DME needed: Rolling Walker and been ordered through [employer].  Patient stated she and her Husband will be moving in to a new address ( different from one on Face Sheet) Address updated Face Sheet to the new [place] address. Patient's Husband or Sister will  be transporting patient home at discharge  No additional TOC needs at this time       Expected Discharge Plan and Services                                               Social Determinants of Health (SDOH) Interventions SDOH Screenings   Food Insecurity: No Food Insecurity (07/14/2022)  Housing: Low Risk  (07/14/2022)  Transportation Needs: No Transportation Needs (07/14/2022)  Utilities: Not At Risk (07/14/2022)  Depression (PHQ2-9): Low Risk  (05/13/2021)  Tobacco Use: High Risk (07/11/2022)    Readmission Risk Interventions     No data to display

## 2022-07-15 NOTE — Progress Notes (Signed)
Mobility Specialist Progress Note:   07/15/22 1612  Mobility  Activity Ambulated independently in hallway  Level of Assistance Independent  Assistive Device None  Distance Ambulated (ft) 550 ft  Activity Response Tolerated well  $Mobility charge 1 Mobility   Pt walking independently in hallway. No complaints. Left in room with all needs met.   Gareth Eagle Elyan Vanwieren Mobility Specialist Please contact via [employer] or  Rehab Office at [phone]

## 2022-07-15 NOTE — Progress Notes (Signed)
CARDIAC REHAB PHASE I   PRE:  Rate/Rhythm: 130 ST  BP:  Sitting: 153/97      SaO2: 96 RA  MODE:  Ambulation: 260 ft   AD:  None  POST:  Rate/Rhythm: 141 ST  BP:  Sitting: 181/101   Recheck: 158/98      SaO2: 86-96 RA  Pt amb with standby assistance, pt denies CP and reports minimal SOB during amb and was returned to room w/o complaint.   Pt walked well, minimal SOB when Sats fell below 90, pt coached on pursed lip breathing. Amb limited d/t drop in Sats, pt encouraged to use IS 10x/hr.  Pt was educated on sternal precautions, Move in the Tube, restrictions, heart healthy diet, ex guidelines (progressive walking), smoking cessation, IS use when d/c, and CRPII. Pt reports having withdrawal symptoms and would like nicotine patches for home. Pt received OHS book and materials on exercise, diet, and smoking cessation.Pt will be referred to [facility].   Rachael Stone  9:23 AM 07/15/2022    Service time is from [place] to [phone].

## 2022-07-15 NOTE — Progress Notes (Signed)
Physical Therapy Treatment Patient Details Name: Rachael Stone MRN: [id number] DOB: [birth year] Today's Date: 07/15/2022   History of Present Illness Pt is a 57 y.o. female admitted 07/09/22 for same day AVR. Pt with acute hypoxic respiratory failure post-op requiring intubation. ETT 12/27-12/28. CXR 12/18 with pneumothorax s/p chest tube placement. PMH includes HTN, COPD, cardiomyopathy, L lung adenocarcinoma, arthritis, polysubstance use, back pain, depression.    PT Comments    Patient progressing well towards PT goals. Session focused on progressive ambulation, stair training and education. Pt ambulating at Mod I level and able to negotiate stairs without difficulty.  Noted to have 2-3/4 DOE with activity with HR ranging from 130s-141 bpm with activity. Sp02 remained >91% on RA throughout. Reviewed sternal precautions and walking program. Pt has met all goals and is safe to d/c from a mobility stand point. Recommend continued walks in hallway. All education completed. DIscharge from therapy.   Recommendations for follow up therapy are one component of a multi-disciplinary discharge planning process, led by the attending physician.  Recommendations may be updated based on patient status, additional functional criteria and insurance authorization.  Follow Up Recommendations  Other (comment) (OP cardiac rehab)     Assistance Recommended at Discharge PRN  Patient can return home with the following Assistance with cooking/housework;Assist for transportation   Equipment Recommendations  None recommended by PT    Recommendations for Other Services       Precautions / Restrictions Precautions Precautions: Sternal Precaution Booklet Issued: No Restrictions Weight Bearing Restrictions: Yes Other Position/Activity Restrictions: sternal precautions     Mobility  Bed Mobility               General bed mobility comments: up in chair upon PT arrival.    Transfers Overall  transfer level: Modified independent Equipment used: None Transfers: Sit to/from Stand Sit to Stand: Modified independent (Device/Increase time)           General transfer comment: Able to stand while adhering to sternal precautions from chair x1, no difficulties    Ambulation/Gait Ambulation/Gait assistance: Modified independent (Device/Increase time) Gait Distance (Feet): 300 Feet Assistive device: None Gait Pattern/deviations: WFL(Within Functional Limits)   Gait velocity interpretation: >2.62 ft/sec, indicative of community ambulatory   General Gait Details: Steady gait with no evidence of imbalance, 2-3/4 DOE but VSS on RA. HR 130s-141 bpm with activity.   Stairs Stairs: Yes Stairs assistance: Modified independent (Device/Increase time) Stair Management: Step to pattern, Alternating pattern, No rails Number of Stairs: 12 (x2 bouts) General stair comments: Ascended alternating steps and descending wtih step to gait no need for rails. DOE noted.   Wheelchair Mobility    Modified Rankin (Stroke Patients Only)       Balance Overall balance assessment: No apparent balance deficits (not formally assessed)                                          Cognition Arousal/Alertness: Awake/alert Behavior During Therapy: WFL for tasks assessed/performed Overall Cognitive Status: Within Functional Limits for tasks assessed                                          Exercises      General Comments General comments (skin integrity, edema, etc.): HR 130s-141 bpm with activity, Sp02  remained >91% on RA.      Pertinent Vitals/Pain Pain Assessment Pain Assessment: Faces Faces Pain Scale: Hurts a little bit Pain Location: incisional, back Pain Descriptors / Indicators: Sore Pain Intervention(s): Monitored during session, Premedicated before session    Home Living                          Prior Function            PT Goals  (current goals can now be found in the care plan section) Progress towards PT goals: Goals met/education completed, patient discharged from PT    Frequency    Min 3X/week      PT Plan Current plan remains appropriate    Co-evaluation              AM-PAC PT "6 Clicks" Mobility   Outcome Measure  Help needed turning from your back to your side while in a flat bed without using bedrails?: None Help needed moving from lying on your back to sitting on the side of a flat bed without using bedrails?: None Help needed moving to and from a bed to a chair (including a wheelchair)?: None Help needed standing up from a chair using your arms (e.g., wheelchair or bedside chair)?: None Help needed to walk in hospital room?: None Help needed climbing 3-5 steps with a railing? : None 6 Click Score: 24    End of Session   Activity Tolerance: Patient tolerated treatment well Patient left: in chair;with call bell/phone within reach Nurse Communication: Mobility status PT Visit Diagnosis: Difficulty in walking, not elsewhere classified (R26.2);Pain Pain - part of body:  (chest/back)     Time: 6916-7561 PT Time Calculation (min) (ACUTE ONLY): 10 min  Charges:  $Gait Training: 8-22 mins                     Marisa Severin, PT, DPT Acute Rehabilitation Services Secure chat preferred Office [place] 07/15/2022, 11:56 AM

## 2022-07-15 NOTE — Progress Notes (Signed)
      [address]       [place] [address]             [phone]      5 Days Post-Op Procedure(s) (LRB): AORTIC VALVE REPLACEMENT (AVR) USING 23MM INSPIRIS RESILIA AORTIC VALVE (N/A) TRANSESOPHAGEAL ECHOCARDIOGRAM (TEE) (N/A)  Subjective:  Up walking around in the room, says she feels OK and is less short of breath today.  HR is 130-140, sinus tach. SBP 150's. BM this morning.   Objective: Vital signs in last 24 hours: Temp:  [97.6 F (36.4 C)-98.8 F (37.1 C)] 97.6 F (36.4 C) (01/02 0726) Pulse Rate:  [104-128] 128 (01/02 0726) Cardiac Rhythm: Sinus tachycardia (01/01 2015) Resp:  [19-29] 19 (01/02 0726) BP: (114-153)/(77-101) 153/97 (01/02 0726) SpO2:  [93 %-100 %] 96 % (01/02 0726) Weight:  [63.5 kg] 63.5 kg (01/02 0541)  Intake/Output from previous day: 01/01 0701 - 01/02 0700 In: 240 [P.O.:240] Out: 3500 [Urine:3500]  General appearance: alert, cooperative, and no distress Heart: sinus tachycardia in 130's Lungs: clear to auscultation bilaterally Abdomen: soft, non-tender Extremities: extremities normal, no edema Wound: clean and dry  Lab Results: Recent Labs    07/14/22 0106  WBC 8.4  HGB 9.2*  HCT 27.6*  PLT 189    BMET:  Recent Labs    07/13/22 1449 07/14/22 0106  NA 134* 135  K 3.4* 3.8  CL 100 100  CO2 24 21*  GLUCOSE 98 103*  BUN 8 6  CREATININE 0.66 0.51  CALCIUM 8.5* 8.5*     PT/INR: No results for input(s): "LABPROT", "INR" in the last 72 hours. ABG    Component Value Date/Time   PHART 7.433 07/11/2022 0438   HCO3 22.5 07/11/2022 0438   TCO2 23 07/11/2022 0438   ACIDBASEDEF 1.0 07/11/2022 0438   O2SAT 96 07/11/2022 0438   CBG (last 3)  Recent Labs    07/14/22 0331 07/14/22 1234 07/14/22 1700  GLUCAP 99 105* 105*     Assessment/Plan: S/P Procedure(s) (LRB): AORTIC VALVE REPLACEMENT (AVR) USING 23MM INSPIRIS RESILIA AORTIC VALVE (N/A) TRANSESOPHAGEAL ECHOCARDIOGRAM (TEE) (N/A)  CV- sinus tach  and hypertension this morning. She has not had BP medications since last PM. Continue Coreg, Norvasc, irbesartan (olmesartan not formulary)  Pulm- Sats OK and breath sounds are clear but she is using the O2 this morning. CXR is pending.  Continue IS Renal- creatinine has been stable, weight is well below pre-op and I&O 3L negative past 24 hours. Suspect she is volume contracted and this may be contriibuting to the tachycardia. Will stop lasix, potassium CBGs controlled, not a diabetic Dispo- patient stable but tachycardic. She had not had her AM medications yet when I saw her. Will stop diuresis. Check the CXR when available. Wean off O2.  Ready for discharge soon but need to see better BP and HR control.    LOS: 6 days   Antony Odea, PA-C 07/15/2022

## 2022-07-15 NOTE — Progress Notes (Signed)
Pt walking independently on RA, received new IS. Encouraged IS 10x/hr.   Rachael Stone 07/15/2022 2:10 PM

## 2022-07-16 ENCOUNTER — Inpatient Hospital Stay (HOSPITAL_COMMUNITY): Payer: Commercial Managed Care - HMO

## 2022-07-16 LAB — CBC
HCT: 29.1 % — ABNORMAL LOW (ref 36.0–46.0)
Hemoglobin: 9.6 g/dL — ABNORMAL LOW (ref 12.0–15.0)
MCH: 29.4 pg (ref 26.0–34.0)
MCHC: 33 g/dL (ref 30.0–36.0)
MCV: 89 fL (ref 80.0–100.0)
Platelets: 344 10*3/uL (ref 150–400)
RBC: 3.27 MIL/uL — ABNORMAL LOW (ref 3.87–5.11)
RDW: 13.3 % (ref 11.5–15.5)
WBC: 9.8 10*3/uL (ref 4.0–10.5)
nRBC: 0 % (ref 0.0–0.2)

## 2022-07-16 LAB — BASIC METABOLIC PANEL
Anion gap: 13 (ref 5–15)
BUN: 5 mg/dL — ABNORMAL LOW (ref 6–20)
CO2: 22 mmol/L (ref 22–32)
Calcium: 8.7 mg/dL — ABNORMAL LOW (ref 8.9–10.3)
Chloride: 99 mmol/L (ref 98–111)
Creatinine, Ser: 0.81 mg/dL (ref 0.44–1.00)
GFR, Estimated: >60 mL/min (ref >60)
Glucose, Bld: 142 mg/dL — ABNORMAL HIGH (ref 70–99)
Potassium: 3.4 mmol/L — ABNORMAL LOW (ref 3.5–5.1)
Sodium: 134 mmol/L — ABNORMAL LOW (ref 135–145)

## 2022-07-16 MED ORDER — CARVEDILOL 25 MG PO TABS
25.0000 mg | ORAL_TABLET | Freq: Two times a day (BID) | ORAL | Status: DC
  Administered 2022-07-16 – 2022-07-17 (×2): 25 mg via ORAL
  Filled 2022-07-16 (×3): qty 1

## 2022-07-16 NOTE — Progress Notes (Addendum)
      [address]       [place], [address]             [phone]      7 Days Post-Op Procedure(s) (LRB): AORTIC VALVE REPLACEMENT (AVR) USING 23MM INSPIRIS RESILIA AORTIC VALVE (N/A) TRANSESOPHAGEAL ECHOCARDIOGRAM (TEE) (N/A) Subjective: Patient states she is feeling well and had just gotten out of the shower even though she was told not to shower.  Objective: Vital signs in last 24 hours: Temp:  [98.2 F (36.8 C)-98.6 F (37 C)] 98.2 F (36.8 C) (01/03 0418) Pulse Rate:  [101-118] 117 (01/03 0418) Cardiac Rhythm: Sinus tachycardia (01/02 1955) Resp:  [18-26] 20 (01/03 0418) BP: (111-130)/(75-93) 130/89 (01/03 0418) SpO2:  [93 %-97 %] 94 % (01/03 0418) Weight:  [62.4 kg] 62.4 kg (01/03 0500)  Hemodynamic parameters for last 24 hours:    Intake/Output from previous day: No intake/output data recorded. Intake/Output this shift: No intake/output data recorded.  General appearance: alert, cooperative, and no distress Neurologic: intact Heart: sinus tachycardia, no murmur Lungs: clear to auscultation bilaterally Abdomen: soft, non-tender; bowel sounds normal; no masses,  no organomegaly Extremities: extremities normal, atraumatic, no cyanosis or edema Wound: Clean and dry, no erythema or sign of infection  Lab Results: Recent Labs    07/14/22 0106 07/16/22 0110  WBC 8.4 9.8  HGB 9.2* 9.6*  HCT 27.6* 29.1*  PLT 189 344   BMET:  Recent Labs    07/14/22 0106 07/16/22 0110  NA 135 134*  K 3.8 3.4*  CL 100 99  CO2 21* 22  GLUCOSE 103* 142*  BUN 6 5*  CREATININE 0.51 0.81  CALCIUM 8.5* 8.7*    PT/INR: No results for input(s): "LABPROT", "INR" in the last 72 hours. ABG    Component Value Date/Time   PHART 7.433 07/11/2022 0438   HCO3 22.5 07/11/2022 0438   TCO2 23 07/11/2022 0438   ACIDBASEDEF 1.0 07/11/2022 0438   O2SAT 96 07/11/2022 0438   CBG (last 3)  Recent Labs    07/14/22 0331 07/14/22 1234 07/14/22 1700  GLUCAP 99 105*  105*    Assessment/Plan: S/P Procedure(s) (LRB): AORTIC VALVE REPLACEMENT (AVR) USING 23MM INSPIRIS RESILIA AORTIC VALVE (N/A) TRANSESOPHAGEAL ECHOCARDIOGRAM (TEE) (N/A)  CV- Sinus tachycardia in the 120's this AM, 101-117 after medications yesterday. BP controlled, SBP 116-130. Continue Coreg, Norvasc, and irbesartan. Will discuss with surgeon Coreg titration for better HR control.  Pulm- Saturating 93-97% on RA. CXR is pending. Ambulating well independently. CXR pending. Continue IS and ambulation.  Renal- Creatinine has been stable, weight is well below pre-op. Lasix and potassium d/c'd.  CBGs controlled, not a diabetic  Dispo- Patient stable but tachycardic, BP better controlled but still tachycardic in the 120's. Will discuss with surgeon HR control. D/C pending better HR control.   LOS: 7 days    Magdalene River, PA-C  07/16/2022

## 2022-07-16 NOTE — Progress Notes (Signed)
Pt requested RN to removed IV on R forearm since the pt stated "it hurting". Remove iv per pt requested. RN informed about the need of new IV insertion since pt is full code and under heart monitoring. Pt verbalized understanding the need of new iV but refused have it putting in. PA notified.   Lavenia Atlas, RN

## 2022-07-16 NOTE — Progress Notes (Signed)
CARDIAC REHAB PHASE I   Pt resting in bed feeling well today. Ambulated with mobility, reports tolerating well. Encouraged IS use and continued ambulation.reviewed eduction for home. All questions and concerns addressed. Hopefull for discharge home today.   [id number]  Vanessa Barbara, RN BSN 07/16/2022 1:39 PM

## 2022-07-17 MED ORDER — POTASSIUM CHLORIDE CRYS ER 20 MEQ PO TBCR
20.0000 meq | EXTENDED_RELEASE_TABLET | Freq: Once | ORAL | Status: AC
  Administered 2022-07-17: 20 meq via ORAL
  Filled 2022-07-17: qty 1

## 2022-07-17 MED ORDER — CARVEDILOL 25 MG PO TABS: 25.0000 mg | ORAL_TABLET | Freq: Two times a day (BID) | ORAL | 5 refills | Status: DC

## 2022-07-17 MED ORDER — TRAMADOL HCL 50 MG PO TABS: 50.0000 mg | ORAL_TABLET | Freq: Four times a day (QID) | ORAL | 0 refills | Status: AC | PRN

## 2022-07-17 MED ORDER — NICOTINE 7 MG/24HR TD PT24: 7.0000 mg | MEDICATED_PATCH | Freq: Every day | TRANSDERMAL | 0 refills | Status: DC

## 2022-07-17 NOTE — Progress Notes (Signed)
      [address]       [place] [address]             [phone]      8 Days Post-Op Procedure(s) (LRB): AORTIC VALVE REPLACEMENT (AVR) USING 23MM INSPIRIS RESILIA AORTIC VALVE (N/A) TRANSESOPHAGEAL ECHOCARDIOGRAM (TEE) (N/A) Subjective: Patient states she feels great and has no concerns. She would like to go home today.   BP stable, HR overall improved past 24 hours. HR 120 now but has not had carvedilol since last evening.   Objective: Vital signs in last 24 hours: Temp:  [97.5 F (36.4 C)-99.6 F (37.6 C)] 98 F (36.7 C) (01/04 0427) Pulse Rate:  [105-123] 109 (01/04 0427) Cardiac Rhythm: Sinus tachycardia (01/04 0427) Resp:  [20-22] 20 (01/04 0427) BP: (109-130)/(76-91) 127/85 (01/04 0427) SpO2:  [93 %-100 %] 95 % (01/04 0427) Weight:  [62.6 kg] 62.6 kg (01/04 0427)     Intake/Output from previous day: 01/03 0701 - 01/04 0700 In: 500 [P.O.:500] Out: -  Intake/Output this shift: No intake/output data recorded.  General appearance: alert, cooperative, and no distress Neurologic: intact Heart: sinus tachycardia, no murmur Lungs: clear to auscultation bilaterally Abdomen: soft, non-tender; bowel sounds normal; no masses,  no organomegaly Extremities: extremities earm and welll perfused without edema.  Wound: Clean and dry, no erythema or sign of infection  Lab Results: Recent Labs    07/16/22 0110  WBC 9.8  HGB 9.6*  HCT 29.1*  PLT 344    BMET:  Recent Labs    07/16/22 0110  NA 134*  K 3.4*  CL 99  CO2 22  GLUCOSE 142*  BUN 5*  CREATININE 0.81  CALCIUM 8.7*     PT/INR: No results for input(s): "LABPROT", "INR" in the last 72 hours. ABG    Component Value Date/Time   PHART 7.433 07/11/2022 0438   HCO3 22.5 07/11/2022 0438   TCO2 23 07/11/2022 0438   ACIDBASEDEF 1.0 07/11/2022 0438   O2SAT 96 07/11/2022 0438   CBG (last 3)  Recent Labs    07/14/22 1234 07/14/22 1700  GLUCAP 105* 105*     Assessment/Plan: S/P  Procedure(s) (LRB): AORTIC VALVE REPLACEMENT (AVR) USING 23MM INSPIRIS RESILIA AORTIC VALVE (N/A) TRANSESOPHAGEAL ECHOCARDIOGRAM (TEE) (N/A)  CV- Sinus tachycardia but overall HR has improved.  BP stable.  Continue Coreg, Norvasc, and irbesartan.  Pulm- Saturating 93-97% on RA. CXR showing improved aeration, no effusion. A  Renal- Creatinine has been stable, weight is well below pre-op. Lasix  d/c'd.  CBGs controlled, not a diabetic Hypokalemia-supplement x 1 dose 62mEq.  Dispo- Plan for discharge today. Instructions given. Follow up arranged   LOS: 8 days    Antony Odea, PA-C  [phone] 07/17/2022

## 2022-07-17 NOTE — Progress Notes (Signed)
Pt able to recall all restrictions.

## 2022-07-17 NOTE — Progress Notes (Signed)
D/c tele. Went over AVS with pt and all questions were addressed.   Lavenia Atlas, RN

## 2022-07-17 NOTE — TOC Transition Note (Signed)
Transition of Care [facility]) - CM/SW Discharge Note   Patient Details  Name: Rachael Stone MRN: [id number] Date of Birth: Jan 30, 1966  [facility]) CM/SW Contact:  Levonne Lapping, RN Phone Number: 07/17/2022, 12:09 PM   Clinical Narrative:    Patient will be transitioning to home with Spouse and the [facility] Office Protocol. Rolling walker has been delivered to bedside. Husband to transport home.   No additional TOC needs identified.     Final next level of care: Home w Home Health Services Barriers to Discharge: Barriers Resolved   Patient Goals and CMS Choice CMS Medicare.gov Compare Post Acute Care list provided to:: Other (Comment Required) (Office Protocol referral) Choice offered to / list presented to : NA  Discharge Placement                         Discharge Plan and Services Additional resources added to the After Visit Summary for   In-house Referral: NA Discharge Planning Services: NA Post Acute Care Choice: Home Health          DME Arranged: Walker rolling DME Agency: [employer] Date DME Agency Contacted: 07/15/22 Time DME Agency Contacted: [phone] [id number] spoke with at DME Agency: Brenton Grills (Office Protocol)   [place]: [facility]        Social Determinants of Health ([place]) Interventions SDOH Screenings   Food Insecurity: No Food Insecurity (07/14/2022)  Housing: Low Risk  (07/14/2022)  Transportation Needs: No Transportation Needs (07/14/2022)  Utilities: Not At Risk (07/14/2022)  Depression (PHQ2-9): Low Risk  (05/13/2021)  Tobacco Use: High Risk (07/11/2022)     Readmission Risk Interventions     No data to display

## 2022-07-18 DIAGNOSIS — Z48812 Encounter for surgical aftercare following surgery on the circulatory system: Secondary | ICD-10-CM

## 2022-07-19 NOTE — Progress Notes (Signed)
This encounter was created in error - please disregard.

## 2022-07-21 ENCOUNTER — Telehealth: Payer: Self-pay | Admitting: *Deleted

## 2022-07-21 NOTE — Telephone Encounter (Signed)
V/O provided to Magda Paganini, RN with [phone] for [facility] 1wk x1, 2wk x2, 1wk x1.

## 2022-07-23 ENCOUNTER — Encounter: Payer: Commercial Managed Care - HMO | Admitting: Cardiovascular Disease

## 2022-07-25 ENCOUNTER — Telehealth: Payer: Self-pay | Admitting: Thoracic Surgery (Cardiothoracic Vascular Surgery)

## 2022-07-25 ENCOUNTER — Telehealth: Payer: Self-pay

## 2022-07-25 ENCOUNTER — Other Ambulatory Visit: Payer: Self-pay

## 2022-07-25 DIAGNOSIS — I351 Nonrheumatic aortic (valve) insufficiency: Secondary | ICD-10-CM

## 2022-07-25 MED ORDER — POTASSIUM CHLORIDE ER 10 MEQ PO TBCR: 10.0000 meq | EXTENDED_RELEASE_TABLET | Freq: Every day | ORAL | 3 refills | Status: DC

## 2022-07-25 NOTE — Telephone Encounter (Signed)
-----  Message from Davina Poke, RN sent at 07/25/2022  9:08 AM EST ----- Rachael Stone,   This patient spoke to [place] today and said she needs her potassium refilled?  Can you check on that for me please?  Thanks, [employer]

## 2022-07-25 NOTE — Telephone Encounter (Signed)
Patient had f/u visit with Dr. Elease Hashimoto 1/10, advised to contact his office for refills of potassium. Patient was adamant that she was not at a physician practice 1/10. She states she will contact Cardiology for prescription refills.

## 2022-07-25 NOTE — Telephone Encounter (Signed)
Pt's medication was resent to pt's new pharmacy as requested. Confirmation received.

## 2022-07-28 ENCOUNTER — Ambulatory Visit (INDEPENDENT_AMBULATORY_CARE_PROVIDER_SITE_OTHER): Payer: Self-pay | Admitting: Thoracic Surgery (Cardiothoracic Vascular Surgery)

## 2022-07-28 ENCOUNTER — Telehealth (HOSPITAL_COMMUNITY): Payer: Self-pay

## 2022-07-28 DIAGNOSIS — I351 Nonrheumatic aortic (valve) insufficiency: Secondary | ICD-10-CM

## 2022-07-28 NOTE — Progress Notes (Signed)
[address]       [name] [address]             [phone]       Patient: Home Provider: Office Consent for Telemedicine visit obtained.  Today's visit was completed via a real-time telehealth (see specific modality noted below). The patient/authorized person provided oral consent at the time of the visit to engage in a telemedicine encounter with the present provider at [facility]. The patient/authorized person was informed of the potential benefits, limitations, and risks of telemedicine. The patient/authorized person expressed understanding that the laws that protect confidentiality also apply to telemedicine. The patient/authorized person acknowledged understanding that telemedicine does not provide emergency services and that he or she would need to call 911 or proceed to the nearest hospital for help if such a need arose.   Total time spent in the clinical discussion 10 minutes.  Telehealth Modality: Phone visit (audio only)  I had a telephone visit with  Rachael Stone who is s/p AVR.  Overall doing well.  Pain is minimal.  Ambulating well. Vitals have been stable.  Rachael Stone will see [place] back in 1 month with a chest x-ray for cardiac rehab clearance.  She will also need a referral for SBRT.  Denise Washburn Keane Scrape

## 2022-07-28 NOTE — Telephone Encounter (Signed)
Attempted to call patient in regards to Cardiac Rehab - LM on VM 

## 2022-07-29 NOTE — Telephone Encounter (Signed)
Returned pt phone call in regards to CR, LMTCB.

## 2022-08-14 ENCOUNTER — Ambulatory Visit (HOSPITAL_COMMUNITY): Payer: Commercial Managed Care - HMO | Attending: Cardiology

## 2022-08-14 DIAGNOSIS — Z952 Presence of prosthetic heart valve: Secondary | ICD-10-CM

## 2022-08-14 DIAGNOSIS — H34213 Partial retinal artery occlusion, bilateral: Secondary | ICD-10-CM

## 2022-08-14 LAB — ECHOCARDIOGRAM COMPLETE
AR max vel: 2.89 cm2
AV Area VTI: 2.66 cm2
AV Area mean vel: 2.46 cm2
AV Mean grad: 10.5 mmHg
AV Peak grad: 18.4 mmHg
Ao pk vel: 2.15 m/s
Area-P 1/2: 5.2 cm2
Est EF: 55
S' Lateral: 3.1 cm

## 2022-08-20 ENCOUNTER — Telehealth: Payer: Self-pay | Admitting: Cardiovascular Disease

## 2022-08-20 NOTE — Telephone Encounter (Signed)
Patient states every since having open heart surgery she has been having hot flashes. She states she wakes up every 15-20 minutes in the night soaking wet. She would like like to discuss echo results.

## 2022-08-22 NOTE — Telephone Encounter (Signed)
Returned call to patient, no answer. Left message asking her to call back to discuss ECHO results (see below).   Thayer Headings, MD 08/19/2022  8:58 PM EST     Normal LV EF of 55%. Grade I DD Trivial MR S/p AVR

## 2022-08-28 MED FILL — Lidocaine HCl Local Preservative Free (PF) Inj 2%: INTRAMUSCULAR | Qty: 14 | Status: AC

## 2022-08-28 MED FILL — Cefazolin Sodium-Dextrose IV Solution 2 GM/100ML-4%: INTRAVENOUS | Qty: 100 | Status: AC

## 2022-08-28 MED FILL — Potassium Chloride Inj 2 mEq/ML: INTRAVENOUS | Qty: 40 | Status: AC

## 2022-08-28 MED FILL — Heparin Sodium (Porcine) Inj 1000 Unit/ML: Qty: 1000 | Status: AC

## 2022-08-29 ENCOUNTER — Ambulatory Visit (INDEPENDENT_AMBULATORY_CARE_PROVIDER_SITE_OTHER): Payer: Self-pay | Admitting: Thoracic Surgery (Cardiothoracic Vascular Surgery)

## 2022-08-29 ENCOUNTER — Other Ambulatory Visit: Payer: Self-pay | Admitting: Thoracic Surgery (Cardiothoracic Vascular Surgery)

## 2022-08-29 VITALS — BP 161/110 | HR 90 | Resp 20 | Ht 65.0 in | Wt 144.0 lb

## 2022-08-29 DIAGNOSIS — Z952 Presence of prosthetic heart valve: Secondary | ICD-10-CM

## 2022-08-29 DIAGNOSIS — R911 Solitary pulmonary nodule: Secondary | ICD-10-CM

## 2022-08-29 MED FILL — Electrolyte-R (PH 7.4) Solution: INTRAVENOUS | Qty: 3000 | Status: AC

## 2022-08-29 MED FILL — Sodium Chloride IV Soln 0.9%: INTRAVENOUS | Qty: 2000 | Status: AC

## 2022-08-29 MED FILL — Calcium Chloride Inj 10%: INTRAVENOUS | Qty: 10 | Status: AC

## 2022-08-29 MED FILL — Mannitol IV Soln 20%: INTRAVENOUS | Qty: 500 | Status: AC

## 2022-08-29 MED FILL — Sodium Bicarbonate IV Soln 8.4%: INTRAVENOUS | Qty: 50 | Status: AC

## 2022-08-29 NOTE — Progress Notes (Signed)
      [address]       [place] [address]             [phone]        Rachael Stone [facility] Medical Record #[id number] Date of Birth: [birth year]  Referring: [name], * Primary Care: [name], MD Primary Cardiologist:[name], MD  Reason for visit:   follow-up  History of Present Illness:     Rachael Stone presents for her 1 follow-up appointment.  Overall she is doing quite well.  She denies any shortness of breath.  She does continue to smoke.  Physical Exam: BP (!) 161/110 (BP Location: Left Arm, Patient Position: Sitting)   Pulse 90   Resp 20   Ht 5\' 5"  (1.651 m)   Wt 144 lb (65.3 kg)   LMP  (LMP Unknown)   SpO2 99% Comment: RA  BMI 23.96 kg/m   Alert NAD Incision clean.  Sternum stable Abdomen, ND No peripheral edema       Assessment / Plan:   57 year old female status post aortic valve replacement for severe AI.  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  Given that she continues to smoke and has no major plans for quitting I do think that SBRT chemotherapy would be her best option.  I made a referral to medical and radiation oncology for this.    In regards to her aortic valve she is doing well.  Her echocardiogram shows good valve function.  She will continue to follow-up with cardiology for this.   Rachael Stone 08/29/2022 3:47 PM

## 2022-09-02 NOTE — Telephone Encounter (Signed)
Returned call to patient and discussed ECHO findings. All questions answered. States she no longer has concerns regarding hot flashes.

## 2022-09-12 NOTE — Progress Notes (Signed)
Thoracic Location of Tumor / Histology: Left Upper Lobe Lung Nodule  Patient presented   PET:   PET 04/10/2022: The lingular nodule is not significantly hypermetabolic. Given interval enlargement over prior CTs, indolent primary bronchogenic carcinoma remains possible. Potential clinical strategies include tissue sampling or chest CT follow-up at 3 months.  CT Chest 02/27/2022: New lobulated lingular nodule measuring 1.5 x 1.1 cm suspicious for lung cancer.  Additionally there was a bilobed nodule versus 2 adjacent nodules in the left upper lobe posterior apex measuring 13 x 6 relatively unchanged from a 2017 exam.    Biopsies of Left Lung Nodule 04/15/2022   Tobacco/Marijuana/Snuff/ETOH use: Current Smoker  Past/Anticipated interventions by cardiothoracic surgery, if any:  Dr. 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  -Given that she continues to smoke and has no major plans for quitting I do think that SBRT chemotherapy would be her best option.  -I made a referral to medical and radiation oncology for this.    Past/Anticipated interventions by medical oncology, if any:    Signs/Symptoms Weight changes, if any:  Respiratory complaints, if any:  Hemoptysis, if any:  Pain issues, if any:    SAFETY ISSUES: Prior radiation?  Pacemaker/ICD?   Possible current pregnancy? Postmenopausal Is the patient on methotrexate?   Current Complaints / other details:

## 2022-09-15 ENCOUNTER — Encounter (HOSPITAL_COMMUNITY): Payer: Commercial Managed Care - HMO

## 2022-09-15 NOTE — Progress Notes (Signed)
Radiation Oncology         [phone] ________________________________  Name: Rachael Stone        MRN: [id number]  Date of Service: 09/16/2022 DOB: 1965-09-04  [id number], Iona Beard, MD  [name], MD     REFERRING PHYSICIAN: [name], MD   DIAGNOSIS: There were no encounter diagnoses.   HISTORY OF PRESENT ILLNESS: Rachael Stone is a 57 y.o. female seen at the request of Dr. [name] for a newly diagnosed     PREVIOUS RADIATION THERAPY: {EXAM; YES/NO:19492::"No"}   PAST MEDICAL HISTORY:  Past Medical History:  Diagnosis Date   Adenocarcinoma of left lung ([place]) 04/15/2022   Lingular   Arthritis    Back pain    Cardiomyopathy ([place])    a. 04/08/2016 Echo: EF 35%, diff HK, mild LVH, Gr2 DD, mod AI, mod to sev MR, mildly dil LA.   COPD (chronic obstructive pulmonary disease) ([place])    DEPRESSIVE DISORDER NOT ELSEWHERE CLASSIFIED 12/14/2008   no current problem per patient on 04/14/22.  Qualifier: Diagnosis of  By: [name], [name]   DYSFUNCTIONAL UTERINE BLEEDING 11/12/2007   Qualifier: Diagnosis of  By: [name], [name]     Dyspnea    ETOH abuse    Hypertension    Hypertensive heart disease with heart failure ([place])    Marijuana abuse    Last use 04/14/22   MENORRHAGIA 12/14/2008   Qualifier: Diagnosis of  By: [name], [name]     Moderate aortic insufficiency    a. 04/08/2016 Echo: mod AI.   Moderate to Severe Mitral Regurgitation    a. 04/08/2016 Echo: mod-sev MR directed centrally.   Tobacco abuse        PAST SURGICAL HISTORY: Past Surgical History:  Procedure Laterality Date   AORTIC VALVE REPLACEMENT N/A 07/09/2022   Procedure: AORTIC VALVE REPLACEMENT (AVR) USING 23MM INSPIRIS RESILIA AORTIC VALVE;  Surgeon: [name], MD;  Location: [facility];  Service: Open Heart Surgery;  Laterality: N/A;   BRONCHIAL BIOPSY  04/15/2022   Procedure: BRONCHIAL BIOPSIES;  Surgeon: [name], DO;  Location: [facility];  Service:  Pulmonary;;   BRONCHIAL NEEDLE ASPIRATION BIOPSY  04/15/2022   Procedure: BRONCHIAL NEEDLE ASPIRATION BIOPSIES;  Surgeon: [name], DO;  Location: [facility];  Service: Pulmonary;;   BUBBLE STUDY  05/12/2022   Procedure: BUBBLE STUDY;  Surgeon: [name], MD;  Location: [facility];  Service: Cardiovascular;;   CARDIAC CATHETERIZATION N/A 04/10/2016   Procedure: Right/Left Heart Cath and Coronary Angiography;  Surgeon: [name], MD;  Location: [facility];  Service: Cardiovascular;  Laterality: N/A;   EXPLORATORY LAPAROTOMY  07/14/2001   Ectopic Pregnancy - unsure which side or if tube was removed   RIGHT/LEFT HEART CATH AND CORONARY ANGIOGRAPHY N/A 05/12/2022   Procedure: RIGHT/LEFT HEART CATH AND CORONARY ANGIOGRAPHY;  Surgeon: [name], MD;  Location: [facility];  Service: Cardiovascular;  Laterality: N/A;   TEE WITHOUT CARDIOVERSION N/A 06/26/2017   Procedure: TRANSESOPHAGEAL ECHOCARDIOGRAM (TEE);  Surgeon: [name], MD;  Location: [facility];  Service: Cardiovascular;  Laterality: N/A;   TEE WITHOUT CARDIOVERSION N/A 05/12/2022   Procedure: TRANSESOPHAGEAL ECHOCARDIOGRAM (TEE);  Surgeon: [name], MD;  Location: [facility];  Service: Cardiovascular;  Laterality: N/A;   TEE WITHOUT CARDIOVERSION N/A 07/09/2022   Procedure: TRANSESOPHAGEAL ECHOCARDIOGRAM (TEE);  Surgeon: [name], MD;  Location: [facility];  Service: Open Heart Surgery;  Laterality: N/A;  TONSILLECTOMY     TOOTH EXTRACTION N/A 06/26/2022   Procedure: DENTAL RESTORATION/EXTRACTIONS;  Surgeon: Charlaine Dalton, DMD;  Location: [facility];  Service: Dentistry;  Laterality: N/A;   TUBAL LIGATION       FAMILY HISTORY:  Family History  Problem Relation Age of Onset   CAD Mother        First MI @ 78 - 49 total.   Cirrhosis Father        alcoholic - died in his [id number].   Lupus Sister    Colon cancer Neg Hx    Colon polyps Neg Hx       SOCIAL HISTORY:  reports that she has been smoking cigarettes. She has a 8.50 pack-year smoking history. She has never used smokeless tobacco. She reports current alcohol use of about 6.0 standard drinks of alcohol per week. She reports current drug use. Drug: Marijuana.   ALLERGIES: Ibuprofen   MEDICATIONS:  Current Outpatient Medications  Medication Sig Dispense Refill   acetaminophen (TYLENOL) 500 MG tablet Take 500 mg by mouth every 8 (eight) hours as needed for moderate pain.     amLODipine-olmesartan (AZOR) 5-20 MG tablet Take 1 tablet by mouth daily.     aspirin EC 81 MG tablet Take 81 mg by mouth 2 (two) times a week. Swallow whole.     carvedilol (COREG) 25 MG tablet Take 1 tablet (25 mg total) by mouth 2 (two) times daily with a meal. 60 tablet 5   cyanocobalamin (VITAMIN B12) 1000 MCG tablet Take 1,000 mcg by mouth daily.     furosemide (LASIX) 20 MG tablet Take 1 tablet (20 mg total) by mouth daily. 90 tablet 3   gabapentin (NEURONTIN) 100 MG capsule Take 100 mg by mouth in the morning and at bedtime.     Menthol, Topical Analgesic, (ICY HOT EX) Apply 1 Application topically daily as needed (pain).     metroNIDAZOLE (METROGEL) 0.75 % vaginal gel Place 1 Applicatorful vaginally at bedtime.     Multiple Vitamins-Minerals (WOMENS MULTI PO) Take 1 tablet by mouth 4 (four) times a week.     nicotine (NICODERM CQ - DOSED IN MG/24 HR) 7 mg/24hr patch Place 1 patch (7 mg total) onto the skin daily. 28 patch 0   potassium chloride (KLOR-CON) 10 MEQ tablet Take 1 tablet (10 mEq total) by mouth daily. 90 tablet 3   thiamine (VITAMIN B1) 100 MG tablet Take 100 mg by mouth daily.     No current facility-administered medications for this visit.     REVIEW OF SYSTEMS: On review of systems, the patient reports that *** is doing well overall. *** denies any chest pain, shortness of breath, cough, fevers, chills, night sweats, unintended weight changes. *** denies any bowel or bladder  disturbances, and denies abdominal pain, nausea or vomiting. *** denies any new musculoskeletal or joint aches or pains. A complete review of systems is obtained and is otherwise negative.     PHYSICAL EXAM:  Wt Readings from Last 3 Encounters:  08/29/22 144 lb (65.3 kg)  07/17/22 138 lb 0.1 oz (62.6 kg)  07/08/22 146 lb 8 oz (66.5 kg)   Temp Readings from Last 3 Encounters:  07/17/22 98.2 F (36.8 C) (Oral)  07/08/22 97.6 F (36.4 C)  07/01/22 98.3 F (36.8 C) (Oral)   BP Readings from Last 3 Encounters:  08/29/22 (!) 161/110  07/17/22 (!) 134/92  07/08/22 (!) 167/85   Pulse Readings from Last 3 Encounters:  08/29/22 90  07/17/22 (!) 123  07/08/22 96    /10  In general this is a well appearing *** in no acute distress. ***'s alert and oriented x4 and appropriate throughout the examination. Cardiopulmonary assessment is negative for acute distress and *** exhibits normal effort.     ECOG = ***  0 - Asymptomatic (Fully active, able to carry on all predisease activities without restriction)  1 - Symptomatic but completely ambulatory (Restricted in physically strenuous activity but ambulatory and able to carry out work of a light or sedentary nature. For example, light housework, office work)  2 - Symptomatic, <50% in bed during the day (Ambulatory and capable of all self care but unable to carry out any work activities. Up and about more than 50% of waking hours)  3 - Symptomatic, >50% in bed, but not bedbound (Capable of only limited self-care, confined to bed or chair 50% or more of waking hours)  4 - Bedbound (Completely disabled. Cannot carry on any self-care. Totally confined to bed or chair)  5 - Death   Eustace Pen MM, Creech RH, Tormey DC, et al. [phone]). "Toxicity and response criteria of the [facility]". [place] Oncol. 5 (6): 649-55    LABORATORY DATA:  Lab Results  Component Value Date   WBC 9.8 07/16/2022   HGB 9.6 (L) 07/16/2022    HCT 29.1 (L) 07/16/2022   MCV 89.0 07/16/2022   PLT 344 07/16/2022   Lab Results  Component Value Date   NA 134 (L) 07/16/2022   K 3.4 (L) 07/16/2022   CL 99 07/16/2022   CO2 22 07/16/2022   Lab Results  Component Value Date   ALT 14 07/11/2022   AST 28 07/11/2022   ALKPHOS 48 07/11/2022   BILITOT 1.1 07/11/2022      RADIOGRAPHY: No results found.     IMPRESSION/PLAN: 1.   In a visit lasting *** minutes, greater than 50% of the time was spent face to face discussing the patient's condition, in preparation for the discussion, and coordinating the patient's care.   The above documentation reflects my direct findings during this shared patient visit. Please see the separate note by Dr. Lisbeth Renshaw on this date for the remainder of the patient's plan of care.    Carola Rhine, [facility]   **Disclaimer: This note was dictated with voice recognition software. Similar sounding words can inadvertently be transcribed and this note may contain transcription errors which may not have been corrected upon publication of note.**

## 2022-09-16 ENCOUNTER — Ambulatory Visit
Admission: RE | Admit: 2022-09-16 | Discharge: 2022-09-16 | Disposition: A | Discharge status: Home / Self Care | Payer: Commercial Managed Care - HMO | Source: Ambulatory Visit | Attending: Radiation Oncology | Admitting: Radiation Oncology

## 2022-09-16 DIAGNOSIS — C3412 Malignant neoplasm of upper lobe, left bronchus or lung: Secondary | ICD-10-CM

## 2022-09-16 DIAGNOSIS — F17210 Nicotine dependence, cigarettes, uncomplicated: Secondary | ICD-10-CM | POA: Diagnosis not present

## 2022-09-18 DIAGNOSIS — I5032 Chronic diastolic (congestive) heart failure: Secondary | ICD-10-CM | POA: Diagnosis not present

## 2022-09-18 DIAGNOSIS — J449 Chronic obstructive pulmonary disease, unspecified: Secondary | ICD-10-CM | POA: Diagnosis not present

## 2022-09-18 DIAGNOSIS — F1010 Alcohol abuse, uncomplicated: Secondary | ICD-10-CM | POA: Diagnosis not present

## 2022-09-18 DIAGNOSIS — I110 Hypertensive heart disease with heart failure: Secondary | ICD-10-CM | POA: Diagnosis not present

## 2022-09-18 DIAGNOSIS — K760 Fatty (change of) liver, not elsewhere classified: Secondary | ICD-10-CM | POA: Diagnosis not present

## 2022-09-18 DIAGNOSIS — E782 Mixed hyperlipidemia: Secondary | ICD-10-CM | POA: Diagnosis not present

## 2022-09-18 DIAGNOSIS — R202 Paresthesia of skin: Secondary | ICD-10-CM | POA: Diagnosis not present

## 2022-09-19 DIAGNOSIS — C3412 Malignant neoplasm of upper lobe, left bronchus or lung: Secondary | ICD-10-CM | POA: Diagnosis not present

## 2022-09-19 DIAGNOSIS — F17210 Nicotine dependence, cigarettes, uncomplicated: Secondary | ICD-10-CM | POA: Diagnosis not present

## 2022-09-29 DIAGNOSIS — R928 Other abnormal and inconclusive findings on diagnostic imaging of breast: Secondary | ICD-10-CM | POA: Diagnosis not present

## 2022-09-29 DIAGNOSIS — N6489 Other specified disorders of breast: Secondary | ICD-10-CM | POA: Diagnosis not present

## 2022-09-29 DIAGNOSIS — N6311 Unspecified lump in the right breast, upper outer quadrant: Secondary | ICD-10-CM | POA: Diagnosis not present

## 2022-09-29 DIAGNOSIS — N6313 Unspecified lump in the right breast, lower outer quadrant: Secondary | ICD-10-CM | POA: Diagnosis not present

## 2022-10-08 ENCOUNTER — Encounter (HOSPITAL_COMMUNITY)
Admission: RE | Admit: 2022-10-08 | Discharge: 2022-10-08 | Disposition: A | Discharge status: Home / Self Care | Payer: 59 | Source: Ambulatory Visit | Attending: Thoracic Surgery (Cardiothoracic Vascular Surgery) | Admitting: Thoracic Surgery (Cardiothoracic Vascular Surgery)

## 2022-10-08 DIAGNOSIS — R911 Solitary pulmonary nodule: Secondary | ICD-10-CM | POA: Insufficient documentation

## 2022-10-08 LAB — GLUCOSE, CAPILLARY: Glucose-Capillary: 111 mg/dL — ABNORMAL HIGH (ref 70–99)

## 2022-10-08 MED ORDER — FLUDEOXYGLUCOSE F - 18 (FDG) INJECTION
8.1000 | Freq: Once | INTRAVENOUS | Status: AC
  Administered 2022-10-08: 8.1 via INTRAVENOUS

## 2022-10-10 ENCOUNTER — Ambulatory Visit
Admission: RE | Admit: 2022-10-10 | Discharge: 2022-10-10 | Disposition: A | Discharge status: Home / Self Care | Payer: Commercial Managed Care - HMO | Source: Ambulatory Visit | Attending: Radiation Oncology | Admitting: Radiation Oncology

## 2022-10-10 DIAGNOSIS — F17210 Nicotine dependence, cigarettes, uncomplicated: Secondary | ICD-10-CM | POA: Insufficient documentation

## 2022-10-10 DIAGNOSIS — C3412 Malignant neoplasm of upper lobe, left bronchus or lung: Secondary | ICD-10-CM | POA: Diagnosis not present

## 2022-10-27 ENCOUNTER — Ambulatory Visit: Payer: 59 | Admitting: Radiation Oncology

## 2022-10-28 ENCOUNTER — Telehealth: Payer: Self-pay | Admitting: Radiation Oncology

## 2022-10-28 NOTE — Telephone Encounter (Signed)
Pt LVM with name, phone number, and dob. Returned call and pt advised her questions have been addressed by a radiation therapist.

## 2022-10-29 ENCOUNTER — Ambulatory Visit: Payer: 59 | Admitting: Radiation Oncology

## 2022-10-30 ENCOUNTER — Ambulatory Visit: Payer: 59 | Admitting: Radiation Oncology

## 2022-10-30 ENCOUNTER — Encounter: Payer: 59 | Admitting: Radiation Oncology

## 2022-10-31 ENCOUNTER — Ambulatory Visit: Payer: 59 | Admitting: Radiation Oncology

## 2022-10-31 DIAGNOSIS — C3412 Malignant neoplasm of upper lobe, left bronchus or lung: Secondary | ICD-10-CM | POA: Diagnosis not present

## 2022-10-31 DIAGNOSIS — F17210 Nicotine dependence, cigarettes, uncomplicated: Secondary | ICD-10-CM | POA: Diagnosis not present

## 2022-11-03 ENCOUNTER — Ambulatory Visit
Admission: RE | Admit: 2022-11-03 | Discharge: 2022-11-03 | Disposition: A | Discharge status: Home / Self Care | Payer: 59 | Source: Ambulatory Visit | Attending: Radiation Oncology | Admitting: Radiation Oncology

## 2022-11-03 ENCOUNTER — Other Ambulatory Visit: Payer: Self-pay

## 2022-11-03 ENCOUNTER — Ambulatory Visit: Payer: 59

## 2022-11-03 ENCOUNTER — Ambulatory Visit: Payer: 59 | Admitting: Radiation Oncology

## 2022-11-03 DIAGNOSIS — C3412 Malignant neoplasm of upper lobe, left bronchus or lung: Secondary | ICD-10-CM | POA: Diagnosis not present

## 2022-11-03 DIAGNOSIS — Z510 Encounter for antineoplastic radiation therapy: Secondary | ICD-10-CM | POA: Diagnosis not present

## 2022-11-03 DIAGNOSIS — F17210 Nicotine dependence, cigarettes, uncomplicated: Secondary | ICD-10-CM | POA: Diagnosis not present

## 2022-11-03 LAB — RAD ONC ARIA SESSION SUMMARY
Course Elapsed Days: 0
Plan Fractions Treated to Date: 1
Plan Prescribed Dose Per Fraction: 6 Gy
Plan Total Fractions Prescribed: 10
Plan Total Prescribed Dose: 60 Gy
Reference Point Dosage Given to Date: 6 Gy
Reference Point Session Dosage Given: 6 Gy
Session Number: 1

## 2022-11-04 ENCOUNTER — Other Ambulatory Visit: Payer: Self-pay

## 2022-11-04 ENCOUNTER — Ambulatory Visit
Admission: RE | Admit: 2022-11-04 | Discharge: 2022-11-04 | Disposition: A | Discharge status: Home / Self Care | Payer: 59 | Source: Ambulatory Visit | Attending: Radiation Oncology | Admitting: Radiation Oncology

## 2022-11-04 ENCOUNTER — Ambulatory Visit: Payer: 59 | Admitting: Radiation Oncology

## 2022-11-04 DIAGNOSIS — F17210 Nicotine dependence, cigarettes, uncomplicated: Secondary | ICD-10-CM | POA: Diagnosis not present

## 2022-11-04 DIAGNOSIS — C3412 Malignant neoplasm of upper lobe, left bronchus or lung: Secondary | ICD-10-CM | POA: Diagnosis not present

## 2022-11-04 DIAGNOSIS — Z510 Encounter for antineoplastic radiation therapy: Secondary | ICD-10-CM | POA: Diagnosis not present

## 2022-11-04 LAB — RAD ONC ARIA SESSION SUMMARY
Course Elapsed Days: 1
Plan Fractions Treated to Date: 2
Plan Prescribed Dose Per Fraction: 6 Gy
Plan Total Fractions Prescribed: 10
Plan Total Prescribed Dose: 60 Gy
Reference Point Dosage Given to Date: 12 Gy
Reference Point Session Dosage Given: 6 Gy
Session Number: 2

## 2022-11-05 ENCOUNTER — Other Ambulatory Visit: Payer: Self-pay

## 2022-11-05 ENCOUNTER — Ambulatory Visit: Payer: 59 | Admitting: Radiation Oncology

## 2022-11-05 ENCOUNTER — Ambulatory Visit
Admission: RE | Admit: 2022-11-05 | Discharge: 2022-11-05 | Disposition: A | Discharge status: Home / Self Care | Payer: 59 | Source: Ambulatory Visit | Attending: Radiation Oncology | Admitting: Radiation Oncology

## 2022-11-05 ENCOUNTER — Ambulatory Visit: Payer: 59

## 2022-11-05 DIAGNOSIS — Z510 Encounter for antineoplastic radiation therapy: Secondary | ICD-10-CM | POA: Diagnosis not present

## 2022-11-05 DIAGNOSIS — F17210 Nicotine dependence, cigarettes, uncomplicated: Secondary | ICD-10-CM | POA: Diagnosis not present

## 2022-11-05 DIAGNOSIS — C3412 Malignant neoplasm of upper lobe, left bronchus or lung: Secondary | ICD-10-CM | POA: Diagnosis not present

## 2022-11-05 LAB — RAD ONC ARIA SESSION SUMMARY
Course Elapsed Days: 2
Plan Fractions Treated to Date: 3
Plan Prescribed Dose Per Fraction: 6 Gy
Plan Total Fractions Prescribed: 10
Plan Total Prescribed Dose: 60 Gy
Reference Point Dosage Given to Date: 18 Gy
Reference Point Session Dosage Given: 6 Gy
Session Number: 3

## 2022-11-06 ENCOUNTER — Ambulatory Visit
Admission: RE | Admit: 2022-11-06 | Discharge: 2022-11-06 | Disposition: A | Discharge status: Home / Self Care | Payer: 59 | Source: Ambulatory Visit | Attending: Radiation Oncology | Admitting: Radiation Oncology

## 2022-11-06 ENCOUNTER — Other Ambulatory Visit: Payer: Self-pay

## 2022-11-06 DIAGNOSIS — F17210 Nicotine dependence, cigarettes, uncomplicated: Secondary | ICD-10-CM | POA: Diagnosis not present

## 2022-11-06 DIAGNOSIS — Z510 Encounter for antineoplastic radiation therapy: Secondary | ICD-10-CM | POA: Diagnosis not present

## 2022-11-06 DIAGNOSIS — C3412 Malignant neoplasm of upper lobe, left bronchus or lung: Secondary | ICD-10-CM | POA: Diagnosis not present

## 2022-11-06 LAB — RAD ONC ARIA SESSION SUMMARY
Course Elapsed Days: 3
Plan Fractions Treated to Date: 4
Plan Prescribed Dose Per Fraction: 6 Gy
Plan Total Fractions Prescribed: 10
Plan Total Prescribed Dose: 60 Gy
Reference Point Dosage Given to Date: 24 Gy
Reference Point Session Dosage Given: 6 Gy
Session Number: 4

## 2022-11-07 ENCOUNTER — Ambulatory Visit
Admission: RE | Admit: 2022-11-07 | Discharge: 2022-11-07 | Disposition: A | Discharge status: Home / Self Care | Payer: 59 | Source: Ambulatory Visit | Attending: Radiation Oncology | Admitting: Radiation Oncology

## 2022-11-07 ENCOUNTER — Other Ambulatory Visit: Payer: Self-pay

## 2022-11-07 DIAGNOSIS — C3412 Malignant neoplasm of upper lobe, left bronchus or lung: Secondary | ICD-10-CM | POA: Diagnosis not present

## 2022-11-07 DIAGNOSIS — Z510 Encounter for antineoplastic radiation therapy: Secondary | ICD-10-CM | POA: Diagnosis not present

## 2022-11-07 DIAGNOSIS — F17210 Nicotine dependence, cigarettes, uncomplicated: Secondary | ICD-10-CM | POA: Diagnosis not present

## 2022-11-07 LAB — RAD ONC ARIA SESSION SUMMARY
Course Elapsed Days: 4
Plan Fractions Treated to Date: 5
Plan Prescribed Dose Per Fraction: 6 Gy
Plan Total Fractions Prescribed: 10
Plan Total Prescribed Dose: 60 Gy
Reference Point Dosage Given to Date: 30 Gy
Reference Point Session Dosage Given: 6 Gy
Session Number: 5

## 2022-11-10 ENCOUNTER — Other Ambulatory Visit: Payer: Self-pay

## 2022-11-10 ENCOUNTER — Telehealth: Payer: Self-pay | Admitting: Cardiovascular Disease

## 2022-11-10 ENCOUNTER — Ambulatory Visit
Admission: RE | Admit: 2022-11-10 | Discharge: 2022-11-10 | Disposition: A | Discharge status: Home / Self Care | Payer: 59 | Source: Ambulatory Visit | Attending: Radiation Oncology | Admitting: Radiation Oncology

## 2022-11-10 DIAGNOSIS — F17210 Nicotine dependence, cigarettes, uncomplicated: Secondary | ICD-10-CM | POA: Diagnosis not present

## 2022-11-10 DIAGNOSIS — Z510 Encounter for antineoplastic radiation therapy: Secondary | ICD-10-CM | POA: Diagnosis not present

## 2022-11-10 DIAGNOSIS — C3412 Malignant neoplasm of upper lobe, left bronchus or lung: Secondary | ICD-10-CM | POA: Diagnosis not present

## 2022-11-10 LAB — RAD ONC ARIA SESSION SUMMARY
Course Elapsed Days: 7
Plan Fractions Treated to Date: 6
Plan Prescribed Dose Per Fraction: 6 Gy
Plan Total Fractions Prescribed: 10
Plan Total Prescribed Dose: 60 Gy
Reference Point Dosage Given to Date: 36 Gy
Reference Point Session Dosage Given: 6 Gy
Session Number: 6

## 2022-11-10 NOTE — Telephone Encounter (Signed)
Patient had questions regarding normal blood pressure readings and what to expect, felt like hers was too elevated and was anxious about what she should do. Currently, 145/98 but patient was also verbally upset on phone. Instructed patient to take blood pressure 1-2 hours after taking BP medication, in a sitting position ensuring legs not crossed. Patient instructed to keep a log of daily readings for 5-7 days and to contact office. Patient denies any chest pain, dizziness, or shortness of breath, stating she feels great today besides feeling anxious after taking her BP. Patient states she is also due to follow up with her PCP and will contact them to set that up. She is to be seen in our office on 01/19/23 but instructed her we can always move this forward if needed.

## 2022-11-10 NOTE — Telephone Encounter (Signed)
Pt c/o BP issue: STAT if pt c/o blurred vision, one-sided weakness or slurred speech  1. What are your last 5 BP readings?   144/108  HR 110 145/98  HR 103     2. Are you having any other symptoms (ex. Dizziness, headache, blurred vision, passed out)?   No  3. What is your BP issue?   Patient stated she has been having high BP readings all weekend.

## 2022-11-11 ENCOUNTER — Ambulatory Visit
Admission: RE | Admit: 2022-11-11 | Discharge: 2022-11-11 | Disposition: A | Discharge status: Home / Self Care | Payer: 59 | Source: Ambulatory Visit | Attending: Radiation Oncology | Admitting: Radiation Oncology

## 2022-11-11 ENCOUNTER — Other Ambulatory Visit: Payer: Self-pay

## 2022-11-11 DIAGNOSIS — Z510 Encounter for antineoplastic radiation therapy: Secondary | ICD-10-CM | POA: Diagnosis not present

## 2022-11-11 DIAGNOSIS — C3412 Malignant neoplasm of upper lobe, left bronchus or lung: Secondary | ICD-10-CM | POA: Diagnosis not present

## 2022-11-11 DIAGNOSIS — F17210 Nicotine dependence, cigarettes, uncomplicated: Secondary | ICD-10-CM | POA: Diagnosis not present

## 2022-11-11 DIAGNOSIS — I5032 Chronic diastolic (congestive) heart failure: Secondary | ICD-10-CM | POA: Diagnosis not present

## 2022-11-11 DIAGNOSIS — I110 Hypertensive heart disease with heart failure: Secondary | ICD-10-CM | POA: Diagnosis not present

## 2022-11-11 DIAGNOSIS — E782 Mixed hyperlipidemia: Secondary | ICD-10-CM | POA: Diagnosis not present

## 2022-11-11 DIAGNOSIS — R202 Paresthesia of skin: Secondary | ICD-10-CM | POA: Diagnosis not present

## 2022-11-11 DIAGNOSIS — F1010 Alcohol abuse, uncomplicated: Secondary | ICD-10-CM | POA: Diagnosis not present

## 2022-11-11 DIAGNOSIS — K760 Fatty (change of) liver, not elsewhere classified: Secondary | ICD-10-CM | POA: Diagnosis not present

## 2022-11-11 DIAGNOSIS — J449 Chronic obstructive pulmonary disease, unspecified: Secondary | ICD-10-CM | POA: Diagnosis not present

## 2022-11-11 LAB — RAD ONC ARIA SESSION SUMMARY
Course Elapsed Days: 8
Plan Fractions Treated to Date: 7
Plan Prescribed Dose Per Fraction: 6 Gy
Plan Total Fractions Prescribed: 10
Plan Total Prescribed Dose: 60 Gy
Reference Point Dosage Given to Date: 42 Gy
Reference Point Session Dosage Given: 6 Gy
Session Number: 7

## 2022-11-12 ENCOUNTER — Ambulatory Visit
Admission: RE | Admit: 2022-11-12 | Discharge: 2022-11-12 | Disposition: A | Discharge status: Home / Self Care | Payer: 59 | Source: Ambulatory Visit | Attending: Radiation Oncology | Admitting: Radiation Oncology

## 2022-11-12 ENCOUNTER — Other Ambulatory Visit: Payer: Self-pay

## 2022-11-12 DIAGNOSIS — Z510 Encounter for antineoplastic radiation therapy: Secondary | ICD-10-CM | POA: Diagnosis not present

## 2022-11-12 DIAGNOSIS — C3412 Malignant neoplasm of upper lobe, left bronchus or lung: Secondary | ICD-10-CM | POA: Insufficient documentation

## 2022-11-12 DIAGNOSIS — F17210 Nicotine dependence, cigarettes, uncomplicated: Secondary | ICD-10-CM | POA: Diagnosis not present

## 2022-11-12 LAB — RAD ONC ARIA SESSION SUMMARY
Course Elapsed Days: 9
Plan Fractions Treated to Date: 8
Plan Prescribed Dose Per Fraction: 6 Gy
Plan Total Fractions Prescribed: 10
Plan Total Prescribed Dose: 60 Gy
Reference Point Dosage Given to Date: 48 Gy
Reference Point Session Dosage Given: 6 Gy
Session Number: 8

## 2022-11-13 ENCOUNTER — Ambulatory Visit
Admission: RE | Admit: 2022-11-13 | Discharge: 2022-11-13 | Disposition: A | Discharge status: Home / Self Care | Payer: 59 | Source: Ambulatory Visit | Attending: Radiation Oncology | Admitting: Radiation Oncology

## 2022-11-13 ENCOUNTER — Other Ambulatory Visit: Payer: Self-pay

## 2022-11-13 DIAGNOSIS — Z510 Encounter for antineoplastic radiation therapy: Secondary | ICD-10-CM | POA: Diagnosis not present

## 2022-11-13 DIAGNOSIS — C3412 Malignant neoplasm of upper lobe, left bronchus or lung: Secondary | ICD-10-CM | POA: Diagnosis not present

## 2022-11-13 DIAGNOSIS — F17210 Nicotine dependence, cigarettes, uncomplicated: Secondary | ICD-10-CM | POA: Diagnosis not present

## 2022-11-13 LAB — RAD ONC ARIA SESSION SUMMARY
Course Elapsed Days: 10
Plan Fractions Treated to Date: 9
Plan Prescribed Dose Per Fraction: 6 Gy
Plan Total Fractions Prescribed: 10
Plan Total Prescribed Dose: 60 Gy
Reference Point Dosage Given to Date: 54 Gy
Reference Point Session Dosage Given: 6 Gy
Session Number: 9

## 2022-11-14 ENCOUNTER — Other Ambulatory Visit: Payer: Self-pay

## 2022-11-14 ENCOUNTER — Ambulatory Visit
Admission: RE | Admit: 2022-11-14 | Discharge: 2022-11-14 | Disposition: A | Discharge status: Home / Self Care | Payer: 59 | Source: Ambulatory Visit | Attending: Radiation Oncology | Admitting: Radiation Oncology

## 2022-11-14 DIAGNOSIS — F17210 Nicotine dependence, cigarettes, uncomplicated: Secondary | ICD-10-CM | POA: Diagnosis not present

## 2022-11-14 DIAGNOSIS — C3412 Malignant neoplasm of upper lobe, left bronchus or lung: Secondary | ICD-10-CM

## 2022-11-14 DIAGNOSIS — Z510 Encounter for antineoplastic radiation therapy: Secondary | ICD-10-CM | POA: Diagnosis not present

## 2022-11-14 LAB — RAD ONC ARIA SESSION SUMMARY
Course Elapsed Days: 11
Plan Fractions Treated to Date: 10
Plan Prescribed Dose Per Fraction: 6 Gy
Plan Total Fractions Prescribed: 10
Plan Total Prescribed Dose: 60 Gy
Reference Point Dosage Given to Date: 60 Gy
Reference Point Session Dosage Given: 6 Gy
Session Number: 10

## 2022-11-20 NOTE — Radiation Completion Notes (Addendum)
  Radiation Oncology         [phone] ________________________________  Name: Rachael Stone MRN: [id number]  Date of Service: 11/14/2022  DOB: 1966-04-17  End of Treatment Note   Diagnosis:  Stage IA2, cT1bN0M0, NSCLC, adenocarcinoma of the LUL.   Intent: Curative     ==========DELIVERED PLANS==========  First Treatment Date: 2022-11-03 - Last Treatment Date: 2022-11-14   Plan Name: Lung_L_UHRT Site: Lung, Left Technique: IMRT Mode: Photon Dose Per Fraction: 6 Gy Prescribed Dose (Delivered / Prescribed): 60 Gy / 60 Gy Prescribed Fxs (Delivered / Prescribed): 10 / 10     ==========ON TREATMENT VISIT DATES========== 2022-11-07, 2022-11-14  See weekly On Treatment Notes is Epic for details. The patient tolerated radiation. She has ongoing care with her cardiology team given her cardiac disease.  The patient will receive a call in about one month from the radiation oncology department. She will be followed in our clinic with repeat CT chest in 6-8 weeks as well.      Osker Mason, PAC

## 2022-11-28 ENCOUNTER — Other Ambulatory Visit: Payer: Self-pay | Admitting: Radiation Oncology

## 2022-11-28 DIAGNOSIS — C3412 Malignant neoplasm of upper lobe, left bronchus or lung: Secondary | ICD-10-CM

## 2022-12-02 ENCOUNTER — Encounter: Payer: Self-pay | Admitting: Cardiovascular Disease

## 2022-12-02 ENCOUNTER — Ambulatory Visit: Payer: 59 | Attending: Cardiovascular Disease | Admitting: Cardiovascular Disease

## 2022-12-02 VITALS — BP 120/90 | HR 103 | Ht 65.5 in | Wt 150.4 lb

## 2022-12-02 DIAGNOSIS — R000 Tachycardia, unspecified: Secondary | ICD-10-CM | POA: Diagnosis not present

## 2022-12-02 DIAGNOSIS — Z952 Presence of prosthetic heart valve: Secondary | ICD-10-CM

## 2022-12-02 MED ORDER — METOPROLOL TARTRATE 50 MG PO TABS: 50.0000 mg | ORAL_TABLET | Freq: Two times a day (BID) | ORAL | 3 refills | Status: DC

## 2022-12-02 NOTE — Patient Instructions (Addendum)
Medication Instructions:  Your physician has recommended you make the following change in your medication:  1-STOP carvedilol  2-START metoprolol tartrate 50 mg by mouth twice daily.  *If you need a refill on your cardiac medications before your next appointment, please call your pharmacy*  Lab Work: If you have labs (blood work) drawn today and your tests are completely normal, you will receive your results only by: MyChart Message (if you have MyChart) OR A paper copy in the mail If you have any lab test that is abnormal or we need to change your treatment, we will call you to review the results.  Testing/Procedures: None ordered today.  Follow-Up: At [facility], you and your health needs are our priority.  As part of our continuing mission to provide you with exceptional heart care, we have created designated Provider Care Teams.  These Care Teams include your primary Cardiologist (physician) and Advanced Practice Providers (APPs -  Physician Assistants and Nurse Practitioners) who all work together to provide you with the care you need, when you need it.  We recommend signing up for the patient portal called "MyChart".  Sign up information is provided on this After Visit Summary.  MyChart is used to connect with patients for Virtual Visits (Telemedicine).  Patients are able to view lab/test results, encounter notes, upcoming appointments, etc.  Non-urgent messages can be sent to your provider as well.   To learn more about what you can do with MyChart, go to [email or web address].    Your next appointment:   3 month(s)  Provider:   Kristeen Miss, MD

## 2022-12-02 NOTE — Progress Notes (Signed)
Cardiology Office Note   Date:  12/02/2022   ID:  Rachael Stone, DOB February 02, 1966, MRN [id number]  PCP:  Rachael Plum, MD  Cardiologist:  Dr. Elease Stone    Chief Complaint  Patient presents with   Hypertension        aortic insufficiency      Notes from Rachael Stone: Rachael Stone is a 57 y.o. female who presents for post hospital for progressive dyspnea and orthopnea and has been found to have LV dysfunction (EF 35%), mod AI, and mod - sev MR.  She has a  h/o tob, alcohol, marijuana, and remote cocaine abuse.  She has no prior cardiac hx.  She had CTA of chest without PE + pulmonary edema and LUL nodule of 9mm.  Echo with EF 30% and mod-sev MR and mod AI. [name]    She had R & L cardiac cath  LV end diastolic pressure is normal. There is moderate left ventricular systolic dysfunction. The left ventricular ejection fraction is 35% by visual estimate. There is no aortic valve stenosis. LV end diastolic pressure is normal Normal coronary arteries.  She has done well no further SOB, swelling, which was in her abd or chest pain.  She has decreased cigarettes to 5 per day down from 10 per day.  Could not afford nicotene patches.  She has cut out salt in her diet.  Unfortunately she has not taken her meds for 2 days, she is worried they will hurt her heart.  We had a long discussion on what happened to her heart and the weakness of her ventricle. We discussed how important the meds were to improve her heart.     Feb.   2, 2018: Seen with her sister, Rachael Stone.   She stopped her meds when they ran out in Dec.   Says she felt better off the meds.   She says that she does not have any symptoms.   Does not want to restart meds.   Saw Rachael Stone who refilled her meds.  Patient did not Get her medications filled. She states that she does not understand why she needs to take these medications and does not want to take medications or her life.  Nov 14, 2016:  Rachael Stone is seen today  with her sister, Rachael Stone.  No CP or dyspnea.    Donates plasma twice a week.   BP is typically normal there  Dec. 6,2018:  Rachael Stone had an echocardiogram recently that revealed worsening systolic congestive heart failure and moderate to severe aortic insufficiency. Seen with her sister , Rachael Stone  No real increase in dyspnea.   September 11, 2017:   Rachael Stone is seen by her self today . Seems to be doing well   Has mild dyspnea.   Had a TEE  Her ejection fraction is now normal with an EF of 55-60%.  She has moderate aortic insufficiency, mild mitral regurgitation Her left ventricular function has improved and her aortic insufficiency and mitral regurgitation have both improved as well since her previous echo.  Complains about having to urinate so much with the lasix   Dec. 8 2020 :  Rachael Stone is seen today for follow-up of her congestive heart failure, mild mitral vegetation, hyperlipidemia.  She also has mild to moderate aortic insufficiency. Avoids salty foods.   January 13, 2020;   Rachael Stone is seen today  Echo recently showed normal LV size and function Moderate -severe AI  No real symptoms.   Not much exercise  April 03, 2020: Rachael Stone  is seen today for follow-up of her congestive heart failure, BP has been low Has a poor appetite,  Has had diarrhea for past week or 2.  Has seen GI.  Has submitted stool samples.   Breathing has been ok  Wt today is 130 lbs.  ( 19 lb weight loss in 9 months )  Drinks 4 beers + 2 shots of whiskey per night   November 06, 2020: Rachael Stone seen today for follow-up of her congestive heart failure.  When I saw her in September she was having lots of diarrhea and a poor appetite.  She had lost quite a bit of weight.  I did not think that she would tolerate restarting her heart failure medications at that time. Wt is 135 lbs ( up 5 lbs )  Still eating [employer] ( works there now)  Still drinks 4 beers and 2 shots of whisky at night ( most night )  We discussed  that this amount of ETOH will cause poor appetite, diarrhea, weight loss    October 31, 2021: Rachael Stone seen today for follow-up of her congestive heart failure, hypertension. Drinking less now 2 beers and perhaps 1 shot of whiskey now.   Oct. 23, 2023  Seen with sister Rachael Stone is seen for a work in visit. Was recenty found to have lung cancer.  Echo in May shows worsening of her aortic insufficiency She has a  h/o tob, alcohol, marijuana, and remote cocaine abuse.   hx of medication noncompliance.    She was recently found to have adenocarcinoma of the lung. We were asked to see her for her worsening AI.   She has seen Rachael Stone who is concerned that she may need her severe AI addressed prior to having partial lobectomy for her lung cancer.   Still smoking MJ. Encouraged her to stop.   Stopped smoking cigarettes .   Over the years she has had issues with medication compliance.  She also still drinks fairly heavily.  We had a long discussion taking better care of herself.  We also discussed the importance of continuing to take her medications on a regular basis.  She understands that she will do better following these procedures if she sticks with the medication and treatment plans.  We discussed transesophageal echo including the procedure, risk, benefits, options.  She understands and agrees  We also discussed heart catheterization.  We discussed the risk, benefits, options concerning heart catheterization.  She understands and agrees to proceed with that as well.  We will get this information back to Rachael Stone.     Dec 02, 2022 She is s/p AVR - Dec. 27 , 2023  Is being treated for biopsy-proven left upper lobe non-small cell lung cancer. She notes that her BP goes up after  HR has been faster  Still smoking ,  down to 6 cigarettes  Still drinking ETOH  , quite a bit at times At least 4 drinks a day ,  sometimes  8 a day  This may be contributing to her  tachycardia     Past Medical History:  Diagnosis Date   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 04/08/2016 Echo: EF 35%, diff HK, mild LVH, Gr2 DD, mod AI, mod to sev MR, mildly dil LA.   COPD (chronic obstructive pulmonary disease) (HCC)  DEPRESSIVE DISORDER NOT ELSEWHERE CLASSIFIED 12/14/2008   no current problem per patient on 04/14/22.  Qualifier: Diagnosis of  By: Deirdre Peer MD, [name]   DYSFUNCTIONAL UTERINE BLEEDING 11/12/2007   Qualifier: Diagnosis of  By: Deirdre Peer MD, [name]     Dyspnea    ETOH abuse    Hypertension    Hypertensive heart disease with heart failure (HCC)    Marijuana abuse    Last use 04/14/22   MENORRHAGIA 12/14/2008   Qualifier: Diagnosis of  By: Deirdre Peer MD, [name]     Moderate aortic insufficiency    a. 04/08/2016 Echo: mod AI.   Moderate to Severe Mitral Regurgitation    a. 04/08/2016 Echo: mod-sev MR directed centrally.   Tobacco abuse     Past Surgical History:  Procedure Laterality Date   AORTIC VALVE REPLACEMENT N/A 07/09/2022   Procedure: AORTIC VALVE REPLACEMENT (AVR) USING INSPIRIS RESILIA AORTIC VALVE;  Surgeon: Corliss Skains, MD;  Location: [facility] OR;  Service: Open Heart Surgery;  Laterality: N/A;   BRONCHIAL BIOPSY  04/15/2022   Procedure: BRONCHIAL BIOPSIES;  Surgeon: Josephine Igo, DO;  Location: [facility] ENDOSCOPY;  Service: Pulmonary;;   BRONCHIAL NEEDLE ASPIRATION BIOPSY  04/15/2022   Procedure: BRONCHIAL NEEDLE ASPIRATION BIOPSIES;  Surgeon: Josephine Igo, DO;  Location: [facility] ENDOSCOPY;  Service: Pulmonary;;   BUBBLE STUDY  05/12/2022   Procedure: BUBBLE STUDY;  Surgeon: Sande Rives, MD;  Location: [facility] ENDOSCOPY;  Service: Cardiovascular;;   CARDIAC CATHETERIZATION N/A 04/10/2016   Procedure: Right/Left Heart Cath and Coronary Angiography;  Surgeon: Corky Crafts, MD;  Location: [facility] INVASIVE CV LAB;  Service: Cardiovascular;  Laterality: N/A;   EXPLORATORY  LAPAROTOMY  07/14/2001   Ectopic Pregnancy - unsure which side or if tube was removed   RIGHT/LEFT HEART CATH AND CORONARY ANGIOGRAPHY N/A 05/12/2022   Procedure: RIGHT/LEFT HEART CATH AND CORONARY ANGIOGRAPHY;  Surgeon: Tonny Bollman, MD;  Location: [facility] INVASIVE CV LAB;  Service: Cardiovascular;  Laterality: N/A;   TEE WITHOUT CARDIOVERSION N/A 06/26/2017   Procedure: TRANSESOPHAGEAL ECHOCARDIOGRAM (TEE);  Surgeon: Rachael Stone Deloris Ping, MD;  Location: [facility] ENDOSCOPY;  Service: Cardiovascular;  Laterality: N/A;   TEE WITHOUT CARDIOVERSION N/A 05/12/2022   Procedure: TRANSESOPHAGEAL ECHOCARDIOGRAM (TEE);  Surgeon: Sande Rives, MD;  Location: [facility] ENDOSCOPY;  Service: Cardiovascular;  Laterality: N/A;   TEE WITHOUT CARDIOVERSION N/A 07/09/2022   Procedure: TRANSESOPHAGEAL ECHOCARDIOGRAM (TEE);  Surgeon: Corliss Skains, MD;  Location: [facility] OR;  Service: Open Heart Surgery;  Laterality: N/A;   TONSILLECTOMY     TOOTH EXTRACTION N/A 06/26/2022   Procedure: DENTAL RESTORATION/EXTRACTIONS;  Surgeon: Sharman Cheek, DMD;  Location: [facility] OR;  Service: Dentistry;  Laterality: N/A;   TUBAL LIGATION       Current Outpatient Medications  Medication Sig Dispense Refill   acetaminophen (TYLENOL) 500 MG tablet Take 500 mg by mouth every 8 (eight) hours as needed for moderate pain.     amLODipine-olmesartan (AZOR) 5-20 MG tablet Take 1 tablet by mouth daily.     aspirin EC 81 MG tablet Take 81 mg by mouth 2 (two) times a week. Swallow whole.     gabapentin (NEURONTIN) 100 MG capsule Take 100 mg by mouth in the morning and at bedtime.     metoprolol tartrate (LOPRESSOR) 50 MG tablet Take 1 tablet (50 mg total) by mouth 2 (two) times daily. 180 tablet 3   rosuvastatin (CRESTOR) 10 MG tablet Take 10 mg by mouth at bedtime.     cyanocobalamin (VITAMIN  B12) 1000 MCG tablet Take 1,000 mcg by mouth daily. (Patient not taking: Reported on 12/02/2022)     furosemide (LASIX) 20 MG tablet Take 1 tablet (20 mg  total) by mouth daily. (Patient not taking: Reported on 12/02/2022) 90 tablet 3   Menthol, Topical Analgesic, (ICY HOT EX) Apply 1 Application topically daily as needed (pain). (Patient not taking: Reported on 12/02/2022)     metroNIDAZOLE (METROGEL) 0.75 % vaginal gel Place 1 Applicatorful vaginally at bedtime. (Patient not taking: Reported on 12/02/2022)     Multiple Vitamins-Minerals (WOMENS MULTI PO) Take 1 tablet by mouth 4 (four) times a week. (Patient not taking: Reported on 12/02/2022)     nicotine (NICODERM CQ - DOSED IN MG/24 HR) 7 mg/24hr patch Place 1 patch (7 mg total) onto the skin daily. (Patient not taking: Reported on 12/02/2022) 28 patch 0   potassium chloride (KLOR-CON) 10 MEQ tablet Take 1 tablet (10 mEq total) by mouth daily. (Patient not taking: Reported on 12/02/2022) 90 tablet 3   thiamine (VITAMIN B1) 100 MG tablet Take 100 mg by mouth daily. (Patient not taking: Reported on 12/02/2022)     No current facility-administered medications for this visit.    Allergies:   Ibuprofen    Social History:  The patient  reports that she has been smoking cigarettes. She has a 8.50 pack-year smoking history. She has never used smokeless tobacco. She reports current alcohol use of about 6.0 standard drinks of alcohol per week. She reports current drug use. Drug: Marijuana.   Family History:  The patient's family history includes CAD in her mother; Cirrhosis in her father; Lupus in her sister.    ROS   - negative   Wt Readings from Last 3 Encounters:  12/02/22 150 lb 6.4 oz (68.2 kg)  08/29/22 144 lb (65.3 kg)  07/17/22 138 lb 0.1 oz (62.6 kg)      Physical Exam: Blood pressure (!) 120/90, pulse (!) 103, height 5' 5.5" (1.664 m), weight 150 lb 6.4 oz (68.2 kg), SpO2 99 %.  HYPERTENSION CONTROL Vitals:   12/02/22 0952 12/02/22 1013  BP: (!) 122/92 (!) 120/90    The patient's blood pressure is elevated above target today.  In order to address the patient's elevated BP: A new  medication was prescribed today.       GEN:  Well nourished, well developed in no acute distress HEENT: Normal NECK: No JVD; No carotid bruits LYMPHATICS: No lymphadenopathy CARDIAC: RRR , no murmurs, rubs, gallops, sternotomy is well healed RESPIRATORY:  Clear to auscultation without rales, wheezing or rhonchi  ABDOMEN: Soft, non-tender, non-distended MUSCULOSKELETAL:  No edema; No deformity  SKIN: Warm and dry NEUROLOGIC:  Alert and oriented x 3     EKG:     Recent Labs: 07/10/2022: Magnesium 2.0 07/11/2022: ALT 14 07/12/2022: B Natriuretic Peptide 531.1 07/16/2022: BUN 5; Creatinine, Ser 0.81; Hemoglobin 9.6; Platelets 344; Potassium 3.4; Sodium 134    Lipid Panel    Component Value Date/Time   CHOL 162 04/25/2019 1045   TRIG 133 04/25/2019 1045   HDL 52 04/25/2019 1045   CHOLHDL 3.1 04/25/2019 1045   CHOLHDL 4.0 Ratio 11/12/2007 2044   VLDL 32 11/12/2007 2044   LDLCALC 87 04/25/2019 1045       Other studies Reviewed: Additional studies/ records that were reviewed today include: .  Cardiac Cath 04/10/16 Conclusion     LV end diastolic pressure is normal. There is moderate left ventricular systolic dysfunction. The left ventricular ejection fraction  is 35% by visual estimate. There is no aortic valve stenosis. LV end diastolic pressure is normal.   Nonischemic cardiomyopathy.  Continue medical therapy.    Indications   Acute combined systolic and diastolic heart failure (HCC) [I50.41 (ICD-10-CM)]  Dyspnea [R06.00 (ICD-10-CM)]   Hemo Data   Flowsheet Row Most Recent Value  Fick Cardiac Output 4.36 L/min  Fick Cardiac Output Index 2.46 (L/min)/BSA  RA A Wave 9 mmHg  RA V Wave 6 mmHg  RA Mean 4 mmHg  RV Systolic Pressure 28 mmHg  RV Diastolic Pressure 3 mmHg  RV EDP 6 mmHg  PA Systolic Pressure 28 mmHg  PA Diastolic Pressure 13 mmHg  PA Mean 20 mmHg  PW A Wave 10 mmHg  PW V Wave 9 mmHg  PW Mean 8 mmHg  AO Systolic Pressure 128 mmHg  AO  Diastolic Pressure 77 mmHg  AO Mean 99 mmHg  LV Systolic Pressure 142 mmHg  LV Diastolic Pressure 7 mmHg  LV EDP 12 mmHg  Arterial Occlusion Pressure Extended Systolic Pressure 126 mmHg  Arterial Occlusion Pressure Extended Diastolic Pressure 76 mmHg  Arterial Occlusion Pressure Extended Mean Pressure 98 mmHg  Left Ventricular Apex Extended Systolic Pressure 137 mmHg  Left Ventricular Apex Extended Diastolic Pressure 8 mmHg  Left Ventricular Apex Extended EDP Pressure 9 mmHg  QP/QS 1  TPVR Index 8.13 HRUI  TSVR Index 40.21 HRUI  PVR SVR Ratio 0.13  TPVR/TSVR Ratio    ECHO: 04/08/16 Study Conclusions   - Left ventricle: The cavity size was mildly dilated. Wall   thickness was increased in a pattern of mild LVH. The estimated   ejection fraction was 35%. Diffuse hypokinesis. Features are   consistent with a pseudonormal left ventricular filling pattern,   with concomitant abnormal relaxation and increased filling   pressure (grade 2 diastolic dysfunction). - Aortic valve: Trileaflet; mildly calcified leaflets. There was no   stenosis. There was moderate regurgitation. There was no   holodiastolic flow reversal noted in the descending thoracic   aorta. - Mitral valve: There was moderate to severe regurgitation directed   centrally. - Left atrium: The atrium was mildly dilated. - Right ventricle: The cavity size was normal. Systolic function   was normal. - Pulmonary arteries: No complete TR doppler jet so unable to   estimate PA systolic pressure. - Systemic veins: IVC measured 1.7 cm with < 50% respirophasic   variation, suggesting RA pressure 8 mmHg.   Impressions:   - Mildly dilated LV with mild LV hypertrophy. EF 35%, diffuse   hypokinesis. Moderate diastolic dysfunction. Moderate aortic   insufficiency with trileaflet aortic valve. Moderate to severe   mitral regurgitation. MR was central, no definite leaflet   abnormalities noted.     ASSESSMENT AND PLAN:  1.   Acute combined systolic and diastolic heart failure (HCC)-    Echo from May 3 shows normal LV systolic and diastolic function .   Moderately elevated pulmonary artery pressures.    Estimated pulmonary pressure is 57 mm Hg.  Trivial MR        2.  Hypertensive heart disease with heart failure (HCC)         3. aortic insufficiency-   she is now status post aortic valve replacement.  Seems to be doing quite well. She still has sinus tachycardia.  Will stop the carvedilol and start her on metoprolol 50 mg twice a day.  Will continue to titrate up her metoprolol as needed to maintain adequate heart rate.  I suspect that a good bit of her tachycardia may be due to her heavy alcohol use.  I have encouraged her to work on greatly reducing her alcohol intake as this will help prevent further heart issues.  She will try to work on it.     4.   ETOH abuse: .  Encouraged her to cut back   5.   Mitral Regurgitation-    trivial   6.   Tobacco abuse.:   has non small cell lung ca,  treated with XRT.    Encouraged her to stop smoking        Kristeen Miss, MD  12/02/2022 10:16 AM    [facility] [address] [place],  [address] [place], [place]  [address] Pager [phone] Phone: [phone]; Fax: [phone]

## 2022-12-24 ENCOUNTER — Telehealth: Payer: Self-pay | Admitting: Radiation Oncology

## 2022-12-24 ENCOUNTER — Telehealth: Payer: Self-pay | Admitting: *Deleted

## 2022-12-24 NOTE — Telephone Encounter (Signed)
Pt called asking about the f/u appts she is supposed to have. Pt noted no imaging has been scheduled at this time. Dr. Joellen Jersey nurse was notified who will make sure this appt is scheduled. Pt was advised she will get a c/b from our team once appt is made.

## 2022-12-24 NOTE — Telephone Encounter (Signed)
CALLED PATIENT TO INFORM OF CT FOR 01-06-23- ARRIVAL TIME- 7:45 AM @ [facility], NO RESTRICTIONS TO TEST, PATIENT TO RECEIVE RESULTS FROM ALISON PERKINS ON 01-12-23 @ 1:30 PM VIA TELEPHONE, SPOKE WITH PATIENT AND SHE IS AWARE OF THESE APPTS. AND THE INSTRUCTIONS

## 2022-12-30 ENCOUNTER — Ambulatory Visit: Payer: Self-pay | Admitting: Radiation Oncology

## 2023-01-06 ENCOUNTER — Encounter (HOSPITAL_COMMUNITY): Payer: Self-pay

## 2023-01-06 ENCOUNTER — Ambulatory Visit (HOSPITAL_COMMUNITY)
Admission: RE | Admit: 2023-01-06 | Discharge: 2023-01-06 | Disposition: A | Discharge status: Home / Self Care | Payer: 59 | Source: Ambulatory Visit | Attending: Radiation Oncology | Admitting: Radiation Oncology

## 2023-01-06 DIAGNOSIS — C3412 Malignant neoplasm of upper lobe, left bronchus or lung: Secondary | ICD-10-CM

## 2023-01-06 DIAGNOSIS — C3490 Malignant neoplasm of unspecified part of unspecified bronchus or lung: Secondary | ICD-10-CM | POA: Diagnosis not present

## 2023-01-06 LAB — POCT I-STAT CREATININE: Creatinine, Ser: 0.8 mg/dL (ref 0.44–1.00)

## 2023-01-06 MED ORDER — IOHEXOL 300 MG/ML  SOLN
100.0000 mL | Freq: Once | INTRAMUSCULAR | Status: AC | PRN
  Administered 2023-01-06: 100 mL via INTRAVENOUS

## 2023-01-12 ENCOUNTER — Ambulatory Visit
Admission: RE | Admit: 2023-01-12 | Discharge: 2023-01-12 | Disposition: A | Discharge status: Home / Self Care | Payer: 59 | Source: Ambulatory Visit | Attending: Radiation Oncology | Admitting: Radiation Oncology

## 2023-01-12 DIAGNOSIS — C3412 Malignant neoplasm of upper lobe, left bronchus or lung: Secondary | ICD-10-CM

## 2023-01-12 NOTE — Progress Notes (Signed)
Radiation Oncology         ([phone]) [phone] ________________________________  Name: Rachael Stone        MRN: [id number]  Date of Service: 01/12/2023 DOB: [birth year]  JO:[id number], Greggory Stallion, MD  Corliss Skains, MD     REFERRING PHYSICIAN: Corliss Skains, MD   DIAGNOSIS: The encounter diagnosis was Malignant neoplasm of upper lobe of left lung (HCC).   HISTORY OF PRESENT ILLNESS: Rachael Stone is a 57 y.o. female with a history of early stage lung cancer.  The patient was found to have a nodule in the lingula of the left upper lobe in August 2023 and was referred to pulmonary medicine.  Her scan at that time had notations that she had been experiencing a 30 pound weight loss and cough.  In comparison apparently this nodule had been seen on imaging in the ER the year before.  The lesion measured 1.5 cm and was concerning for suspicious features to indicate a malignant process.  There was also a bilobed nodule versus 2 adjacent nodules in the left upper lobe posterior apex measuring 13 mm and stable since her scan in 2017.  She was counseled on PET imaging which was performed on 04/10/2022; the lingular nodule was not significantly hypermetabolic but overall enlargement had been seen and felt to still remain concerning.  She underwent a bronchoscopy on 04/15/2022 and cytology from the lingula fine-needle aspirate target 1 was consistent with adenocarcinoma a second target fine-needle aspirate was negative.  She was referred to cardiothoracic surgery and met with Dr. Cliffton Asters.  Given her other underlying cardiac disease including aortic valvular disease she saw cardiology and underwent echocardiogram and coronary angiography in October 2022.  She had significant valvular disease and underwent aortic valve replacement on 07/09/2022.  This was with Dr. Cliffton Asters as well.  A repeat PET scan on 10/08/22 showed the known lesion in the lingula measuring 1.8 cm with an SUV of 1.7, and a solid LUL  nodule measuring 8 mm was stable and did not show any hypermetabolism. She received SBRT to the LUL at the level of the lingula which she tolerated well and completed on 11/14/22.  Post treatment imaging on 01/06/2023 showed improvement now measuring 1.3 cm in greatest dimension.  No new nodules were appreciated, and stability of the nodule in the left lung apex was noted. She's contacted to review this scan by  phone.    PREVIOUS RADIATION THERAPY:   SBRT Treatment: First Treatment Date: 2022-11-03 - Last Treatment Date: 2022-11-14 Plan Name: Lung_L_UHRT Site: LUL Lingula Technique: IMRT Mode: Photon Dose Per Fraction: 6 Gy Prescribed Dose (Delivered / Prescribed): 60 Gy / 60 Gy Prescribed Fxs (Delivered / Prescribed): 10 / 10   PAST MEDICAL HISTORY:  Past Medical History:  Diagnosis Date   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 04/08/2016 Echo: EF 35%, diff HK, mild LVH, Gr2 DD, mod AI, mod to sev MR, mildly dil LA.   COPD (chronic obstructive pulmonary disease) (HCC)    DEPRESSIVE DISORDER NOT ELSEWHERE CLASSIFIED 12/14/2008   no current problem per patient on 04/14/22.  Qualifier: Diagnosis of  By: Deirdre Peer MD, [name]   DYSFUNCTIONAL UTERINE BLEEDING 11/12/2007   Qualifier: Diagnosis of  By: Deirdre Peer MD, [name]     Dyspnea    ETOH abuse    Hypertension    Hypertensive heart disease with heart failure (HCC)  Marijuana abuse    Last use 04/14/22   MENORRHAGIA 12/14/2008   Qualifier: Diagnosis of  By: Deirdre Peer MD, [name]     Moderate aortic insufficiency    a. 04/08/2016 Echo: mod AI.   Moderate to Severe Mitral Regurgitation    a. 04/08/2016 Echo: mod-sev MR directed centrally.   Tobacco abuse        PAST SURGICAL HISTORY: Past Surgical History:  Procedure Laterality Date   AORTIC VALVE REPLACEMENT N/A 07/09/2022   Procedure: AORTIC VALVE REPLACEMENT (AVR) USING INSPIRIS RESILIA AORTIC VALVE;  Surgeon:  Corliss Skains, MD;  Location: [facility] OR;  Service: Open Heart Surgery;  Laterality: N/A;   BRONCHIAL BIOPSY  04/15/2022   Procedure: BRONCHIAL BIOPSIES;  Surgeon: Josephine Igo, DO;  Location: [facility] ENDOSCOPY;  Service: Pulmonary;;   BRONCHIAL NEEDLE ASPIRATION BIOPSY  04/15/2022   Procedure: BRONCHIAL NEEDLE ASPIRATION BIOPSIES;  Surgeon: Josephine Igo, DO;  Location: [facility] ENDOSCOPY;  Service: Pulmonary;;   BUBBLE STUDY  05/12/2022   Procedure: BUBBLE STUDY;  Surgeon: Sande Rives, MD;  Location: [facility] ENDOSCOPY;  Service: Cardiovascular;;   CARDIAC CATHETERIZATION N/A 04/10/2016   Procedure: Right/Left Heart Cath and Coronary Angiography;  Surgeon: Corky Crafts, MD;  Location: [facility] INVASIVE CV LAB;  Service: Cardiovascular;  Laterality: N/A;   EXPLORATORY LAPAROTOMY  07/14/2001   Ectopic Pregnancy - unsure which side or if tube was removed   RIGHT/LEFT HEART CATH AND CORONARY ANGIOGRAPHY N/A 05/12/2022   Procedure: RIGHT/LEFT HEART CATH AND CORONARY ANGIOGRAPHY;  Surgeon: Tonny Bollman, MD;  Location: [facility] INVASIVE CV LAB;  Service: Cardiovascular;  Laterality: N/A;   TEE WITHOUT CARDIOVERSION N/A 06/26/2017   Procedure: TRANSESOPHAGEAL ECHOCARDIOGRAM (TEE);  Surgeon: Elease Hashimoto Deloris Ping, MD;  Location: [facility] ENDOSCOPY;  Service: Cardiovascular;  Laterality: N/A;   TEE WITHOUT CARDIOVERSION N/A 05/12/2022   Procedure: TRANSESOPHAGEAL ECHOCARDIOGRAM (TEE);  Surgeon: Sande Rives, MD;  Location: [facility] ENDOSCOPY;  Service: Cardiovascular;  Laterality: N/A;   TEE WITHOUT CARDIOVERSION N/A 07/09/2022   Procedure: TRANSESOPHAGEAL ECHOCARDIOGRAM (TEE);  Surgeon: Corliss Skains, MD;  Location: [facility] OR;  Service: Open Heart Surgery;  Laterality: N/A;   TONSILLECTOMY     TOOTH EXTRACTION N/A 06/26/2022   Procedure: DENTAL RESTORATION/EXTRACTIONS;  Surgeon: Sharman Cheek, DMD;  Location: [facility] OR;  Service: Dentistry;  Laterality: N/A;   TUBAL LIGATION       FAMILY HISTORY:   Family History  Problem Relation Age of Onset   CAD Mother        First MI @ 17 - 8 total.   Cirrhosis Father        alcoholic - died in his 51's.   Lupus Sister    Colon cancer Neg Hx    Colon polyps Neg Hx      SOCIAL HISTORY:  reports that she has been smoking cigarettes. She has a 8.50 pack-year smoking history. She has never used smokeless tobacco. She reports current alcohol use of about 6.0 standard drinks of alcohol per week. She reports current drug use. Drug: Marijuana. The patient is single and lives in [place]. 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 She is trying to file for disability with her heart history. She's meeting a lot of resistance and is working with an attorney, but she was under the impression she wouldn't be able to return to work for an extended period of time based on her surgery.    ALLERGIES: Ibuprofen  MEDICATIONS:  Current Outpatient Medications  Medication Sig Dispense Refill   acetaminophen (TYLENOL) 500 MG tablet Take 500 mg by mouth every 8 (eight) hours as needed for moderate pain.     amLODipine-olmesartan (AZOR) 5-20 MG tablet Take 1 tablet by mouth daily.     aspirin EC 81 MG tablet Take 81 mg by mouth 2 (two) times a week. Swallow whole.     cyanocobalamin (VITAMIN B12) 1000 MCG tablet Take 1,000 mcg by mouth daily. (Patient not taking: Reported on 12/02/2022)     furosemide (LASIX) 20 MG tablet Take 1 tablet (20 mg total) by mouth daily. (Patient not taking: Reported on 12/02/2022) 90 tablet 3   gabapentin (NEURONTIN) 100 MG capsule Take 100 mg by mouth in the morning and at bedtime.     Menthol, Topical Analgesic, (ICY HOT EX) Apply 1 Application topically daily as needed (pain). (Patient not taking: Reported on 12/02/2022)     metoprolol tartrate (LOPRESSOR) 50 MG tablet Take 1 tablet (50 mg total) by mouth 2 (two) times daily. 180 tablet 3   metroNIDAZOLE (METROGEL) 0.75 % vaginal gel  Place 1 Applicatorful vaginally at bedtime. (Patient not taking: Reported on 12/02/2022)     Multiple Vitamins-Minerals (WOMENS MULTI PO) Take 1 tablet by mouth 4 (four) times a week. (Patient not taking: Reported on 12/02/2022)     nicotine (NICODERM CQ - DOSED IN MG/24 HR) 7 mg/24hr patch Place 1 patch (7 mg total) onto the skin daily. (Patient not taking: Reported on 12/02/2022) 28 patch 0   potassium chloride (KLOR-CON) 10 MEQ tablet Take 1 tablet (10 mEq total) by mouth daily. (Patient not taking: Reported on 12/02/2022) 90 tablet 3   rosuvastatin (CRESTOR) 10 MG tablet Take 10 mg by mouth at bedtime.     thiamine (VITAMIN B1) 100 MG tablet Take 100 mg by mouth daily. (Patient not taking: Reported on 12/02/2022)     No current facility-administered medications for this visit.     REVIEW OF SYSTEMS: On review of systems, the patient reports she's tired, and has been reminded by clinicians it can take a while to feel "at baseline" after her valve surgery. She's not sure about how to make plans for the future. She's lost her home and is now living in an RV at a family member's house. She still has her insurance through [employer]. She denies any trouble with shortness of breath or chest pain. No other complaints are verbalized.      PHYSICAL EXAM:  Unable to determine due to encounter type.   ECOG = 1  0 - Asymptomatic (Fully active, able to carry on all predisease activities without restriction)  1 - Symptomatic but completely ambulatory (Restricted in physically strenuous activity but ambulatory and able to carry out work of a light or sedentary nature. For example, light housework, office work)  2 - Symptomatic, <50% in bed during the day (Ambulatory and capable of all self care but unable to carry out any work activities. Up and about more than 50% of waking hours)  3 - Symptomatic, >50% in bed, but not bedbound (Capable of only limited self-care, confined to bed or chair 50% or more of  waking hours)  4 - Bedbound (Completely disabled. Cannot carry on any self-care. Totally confined to bed or chair)  5 - Death   Santiago Glad MM, Creech RH, Tormey DC, et al. [phone]). "Toxicity and response criteria of the [facility]". Am. Evlyn Clines. Oncol. 5 (6): [phone]  LABORATORY DATA:  Lab Results  Component Value Date   WBC 9.8 07/16/2022   HGB 9.6 (L) 07/16/2022   HCT 29.1 (L) 07/16/2022   MCV 89.0 07/16/2022   PLT 344 07/16/2022   Lab Results  Component Value Date   NA 134 (L) 07/16/2022   K 3.4 (L) 07/16/2022   CL 99 07/16/2022   CO2 22 07/16/2022   Lab Results  Component Value Date   ALT 14 07/11/2022   AST 28 07/11/2022   ALKPHOS 48 07/11/2022   BILITOT 1.1 07/11/2022      RADIOGRAPHY: CT CHEST W CONTRAST  Result Date: 01/09/2023 CLINICAL DATA:  Non-small cell lung cancer status post radiation therapy. * Tracking Code: BO * EXAM: CT CHEST WITH CONTRAST TECHNIQUE: Multidetector CT imaging of the chest was performed during intravenous contrast administration. RADIATION DOSE REDUCTION: This exam was performed according to the departmental dose-optimization program which includes automated exposure control, adjustment of the mA and/or kV according to patient size and/or use of iterative reconstruction technique. CONTRAST:  OMNIPAQUE IOHEXOL 300 MG/ML  SOLN COMPARISON:  PET 10/08/2022 and CT chest 02/27/2022. FINDINGS: Cardiovascular: Aortic valve replacement. Coronary artery calcification. Heart is at the upper limits of normal in size. Left ventricle appears hypertrophied. No pericardial effusion. Mediastinum/Nodes: Numerous mediastinal and hilar lymph nodes, none enlarged by CT size criteria and similar to 02/27/2022. No axillary adenopathy. Esophagus is grossly unremarkable. Lungs/Pleura: Centrilobular emphysema. Bilobed nodule in the posteromedial apical left upper lobe measures 0.6 x 1.2 cm (5/33), stable from 02/27/2022. Fiducial markers in the  lingula. Irregular nodule in the inferior lingula has changed slightly in configuration, overall measuring 1.3 x 1.3 cm (5/97), previously 1.1 x 1.5 cm. No new pulmonary nodules. No pleural fluid. Airway is unremarkable. Upper Abdomen: Visualized portions of the liver and right adrenal gland are unremarkable. Slight thickening of the left adrenal gland, unchanged. No specific follow-up necessary. Visualized portions of the left kidney, spleen, pancreas, stomach and bowel otherwise grossly unremarkable. No upper abdominal adenopathy. Musculoskeletal: Degenerative changes in the spine. No worrisome lytic or sclerotic lesions. IMPRESSION: 1. Lingular nodule measures the same as 02/27/2022 but has changed slightly in configuration, suggesting very minimal improvement status post radiation therapy. 2. Bilobed nodule in the apical left upper lobe, not hypermetabolic on 10/08/2022 and stable from 02/27/2022. Recommend continued attention on follow-up. 3. Age advanced coronary artery calcification. Left ventricle appears hypertrophied. 4.  Emphysema (ICD10-J43.9). Electronically Signed   By: Leanna Battles M.D.   On: 01/09/2023 08:59       IMPRESSION/PLAN: 1. Stage IA2, cT1bN0M0, NSCLC, adenocarcinoma of the LUL. The patient is doing well and her imaging was reviewed. The LUL nodule in the apex continues to be followed and appears benign as it's been unchanged since 2017. The lingular LUL nodule has improved in size since radiation. We discussed the rationale for close follow up and that the ablative approach of the style of therapy she received leaves long term scar tissue in the site of prior cancer, so what is visible in the lingula is favored to be post radiation change rather than concerns for active cancer. She's aware of the risks of developing new disease and we reviewed the recommendations for repeat scan in 6 months time per NCCN Guidelines. She is in agreement with this plan.  2. History of cardiac disease.   She will continue to follow-up with cardiology and CT surgery.  We appreciate their input for future medical care and for clarity about her social  needs and filing concerns about disability. I encouraged her to call for follow ups so she can have further clarity on what her providers feel is realistic for her future needs.      Osker Mason, [facility]   **Disclaimer: This note was dictated with voice recognition software. Similar sounding words can inadvertently be transcribed and this note may contain transcription errors which may not have been corrected upon publication of note.**

## 2023-01-19 ENCOUNTER — Ambulatory Visit: Payer: 59 | Admitting: Cardiovascular Disease

## 2023-03-01 ENCOUNTER — Encounter: Payer: Self-pay | Admitting: Cardiovascular Disease

## 2023-03-01 NOTE — Progress Notes (Unsigned)
Cardiology Office Note   Date:  03/03/2023   ID:  Rachael Stone, DOB 04/27/1966, MRN [id number]  PCP:  Rachael Plum, MD  Cardiologist:  Dr. Elease Stone    Chief Complaint  Patient presents with   Aortic valve replacement      Notes from Rachael Stone: Rachael Stone is a 57 y.o. female who presents for post hospital for progressive dyspnea and orthopnea and has been found to have LV dysfunction (EF 35%), mod AI, and mod - sev MR.  She has a  h/o tob, alcohol, marijuana, and remote cocaine abuse.  She has no prior cardiac hx.  She had CTA of chest without PE + pulmonary edema and LUL nodule of 9mm.  Echo with EF 30% and mod-sev MR and mod AI. [name]    She had R & L cardiac cath  LV end diastolic pressure is normal. There is moderate left ventricular systolic dysfunction. The left ventricular ejection fraction is 35% by visual estimate. There is no aortic valve stenosis. LV end diastolic pressure is normal Normal coronary arteries.  She has done well no further SOB, swelling, which was in her abd or chest pain.  She has decreased cigarettes to 5 per day down from 10 per day.  Could not afford nicotene patches.  She has cut out salt in her diet.  Unfortunately she has not taken her meds for 2 days, she is worried they will hurt her heart.  We had a long discussion on what happened to her heart and the weakness of her ventricle. We discussed how important the meds were to improve her heart.     Feb.   2, 2018: Seen with her sister, Rachael Stone.   She stopped her meds when they ran out in Dec.   Says she felt better off the meds.   She says that she does not have any symptoms.   Does not want to restart meds.   Saw Rachael Stone who refilled her meds.  Patient did not Get her medications filled. She states that she does not understand why she needs to take these medications and does not want to take medications or her life.  Nov 14, 2016:  Rachael Stone is seen today with her sister,  Rachael Stone.  No CP or dyspnea.    Donates plasma twice a week.   BP is typically normal there  Dec. 6,2018:  Rachael Stone had an echocardiogram recently that revealed worsening systolic congestive heart failure and moderate to severe aortic insufficiency. Seen with her sister , Rachael Stone  No real increase in dyspnea.   September 11, 2017:   Rachael Stone is seen by her self today . Seems to be doing well   Has mild dyspnea.   Had a TEE  Her ejection fraction is now normal with an EF of 55-60%.  She has moderate aortic insufficiency, mild mitral regurgitation Her left ventricular function has improved and her aortic insufficiency and mitral regurgitation have both improved as well since her previous echo.  Complains about having to urinate so much with the lasix   Dec. 8 2020 :  Rachael Stone is seen today for follow-up of her congestive heart failure, mild mitral vegetation, hyperlipidemia.  She also has mild to moderate aortic insufficiency. Avoids salty foods.   January 13, 2020;   Rachael Stone is seen today  Echo recently showed normal LV size and function Moderate -severe AI  No real symptoms.   Not much exercise   April 03, 2020: Rachael Stone  is seen today for follow-up of her congestive heart failure, BP has been low Has a poor appetite,  Has had diarrhea for past week or 2.  Has seen GI.  Has submitted stool samples.   Breathing has been ok  Wt today is 130 lbs.  ( 19 lb weight loss in 9 months )  Drinks 4 beers + 2 shots of whiskey per night   November 06, 2020: Rachael Stone seen today for follow-up of her congestive heart failure.  When I saw her in September she was having lots of diarrhea and a poor appetite.  She had lost quite a bit of weight.  I did not think that she would tolerate restarting her heart failure medications at that time. Wt is 135 lbs ( up 5 lbs )  Still eating [employer] ( works there now)  Still drinks 4 beers and 2 shots of whisky at night ( most night )  We discussed that this amount  of ETOH will cause poor appetite, diarrhea, weight loss    October 31, 2021: Rachael Stone seen today for follow-up of her congestive heart failure, hypertension. Drinking less now 2 beers and perhaps 1 shot of whiskey now.   Oct. 23, 2023  Seen with sister Rachael Stone is seen for a work in visit. Was recenty found to have lung cancer.  Echo in May shows worsening of her aortic insufficiency She has a  h/o tob, alcohol, marijuana, and remote cocaine abuse.   hx of medication noncompliance.    She was recently found to have adenocarcinoma of the lung. We were asked to see her for her worsening AI.   She has seen Rachael Stone who is concerned that she may need her severe AI addressed prior to having partial lobectomy for her lung cancer.   Still smoking MJ. Encouraged her to stop.   Stopped smoking cigarettes .   Over the years she has had issues with medication compliance.  She also still drinks fairly heavily.  We had a long discussion taking better care of herself.  We also discussed the importance of continuing to take her medications on a regular basis.  She understands that she will do better following these procedures if she sticks with the medication and treatment plans.  We discussed transesophageal echo including the procedure, risk, benefits, options.  She understands and agrees  We also discussed heart catheterization.  We discussed the risk, benefits, options concerning heart catheterization.  She understands and agrees to proceed with that as well.  We will get this information back to Rachael Stone.     Dec 02, 2022 She is s/p AVR - Dec. 27 , 2023  Is being treated for biopsy-proven left upper lobe non-small cell lung cancer. She notes that her BP goes up after  HR has been faster  Still smoking ,  down to 6 cigarettes  Still drinking ETOH  , quite a bit at times At least 4 drinks a day ,  sometimes  8 a day  This may be contributing to her tachycardia      Aug. 19, 2024 Rachael Stone is here for follow up of her AVR Has hx of non-small cell lung cancer  Hx of ETOH abuse  Feels great  BP is elevated ( has not taken her meds yet this am)  Is not taking her rosuvastatin  Takes the lasix as needed.  Instructed her to take Kdur whenever she takes a lasix      Past  Medical History:  Diagnosis Date   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 04/08/2016 Echo: EF 35%, diff HK, mild LVH, Gr2 DD, mod AI, mod to sev MR, mildly dil LA.   COPD (chronic obstructive pulmonary disease) (HCC)    DEPRESSIVE DISORDER NOT ELSEWHERE CLASSIFIED 12/14/2008   no current problem per patient on 04/14/22.  Qualifier: Diagnosis of  By: Deirdre Peer MD, [name]   DYSFUNCTIONAL UTERINE BLEEDING 11/12/2007   Qualifier: Diagnosis of  By: Deirdre Peer MD, [name]     Dyspnea    ETOH abuse    Hypertension    Hypertensive heart disease with heart failure (HCC)    Marijuana abuse    Last use 04/14/22   MENORRHAGIA 12/14/2008   Qualifier: Diagnosis of  By: Deirdre Peer MD, [name]     Moderate aortic insufficiency    a. 04/08/2016 Echo: mod AI.   Moderate to Severe Mitral Regurgitation    a. 04/08/2016 Echo: mod-sev MR directed centrally.   Tobacco abuse     Past Surgical History:  Procedure Laterality Date   AORTIC VALVE REPLACEMENT N/A 07/09/2022   Procedure: AORTIC VALVE REPLACEMENT (AVR) USING INSPIRIS RESILIA AORTIC VALVE;  Surgeon: Corliss Skains, MD;  Location: [facility] OR;  Service: Open Heart Surgery;  Laterality: N/A;   BRONCHIAL BIOPSY  04/15/2022   Procedure: BRONCHIAL BIOPSIES;  Surgeon: Josephine Igo, DO;  Location: [facility] ENDOSCOPY;  Service: Pulmonary;;   BRONCHIAL NEEDLE ASPIRATION BIOPSY  04/15/2022   Procedure: BRONCHIAL NEEDLE ASPIRATION BIOPSIES;  Surgeon: Josephine Igo, DO;  Location: [facility] ENDOSCOPY;  Service: Pulmonary;;   BUBBLE STUDY  05/12/2022   Procedure: BUBBLE STUDY;  Surgeon: Sande Rives, MD;  Location: [facility] ENDOSCOPY;  Service: Cardiovascular;;   CARDIAC CATHETERIZATION N/A 04/10/2016   Procedure: Right/Left Heart Cath and Coronary Angiography;  Surgeon: Corky Crafts, MD;  Location: [facility] INVASIVE CV LAB;  Service: Cardiovascular;  Laterality: N/A;   EXPLORATORY LAPAROTOMY  07/14/2001   Ectopic Pregnancy - unsure which side or if tube was removed   RIGHT/LEFT HEART CATH AND CORONARY ANGIOGRAPHY N/A 05/12/2022   Procedure: RIGHT/LEFT HEART CATH AND CORONARY ANGIOGRAPHY;  Surgeon: Tonny Bollman, MD;  Location: [facility] INVASIVE CV LAB;  Service: Cardiovascular;  Laterality: N/A;   TEE WITHOUT CARDIOVERSION N/A 06/26/2017   Procedure: TRANSESOPHAGEAL ECHOCARDIOGRAM (TEE);  Surgeon: Rachael Stone Deloris Ping, MD;  Location: [facility] ENDOSCOPY;  Service: Cardiovascular;  Laterality: N/A;   TEE WITHOUT CARDIOVERSION N/A 05/12/2022   Procedure: TRANSESOPHAGEAL ECHOCARDIOGRAM (TEE);  Surgeon: Sande Rives, MD;  Location: [facility] ENDOSCOPY;  Service: Cardiovascular;  Laterality: N/A;   TEE WITHOUT CARDIOVERSION N/A 07/09/2022   Procedure: TRANSESOPHAGEAL ECHOCARDIOGRAM (TEE);  Surgeon: Corliss Skains, MD;  Location[facility] OR;  Service: Open Heart Surgery;  Laterality: N/A;   TONSILLECTOMY     TOOTH EXTRACTION N/A 06/26/2022   Procedure: DENTAL RESTORATION/EXTRACTIONS;  Surgeon: Sharman Cheek, DMD;  Location: [facility] OR;  Service: Dentistry;  Laterality: N/A;   TUBAL LIGATION       Current Outpatient Medications  Medication Sig Dispense Refill   amLODipine-olmesartan (AZOR) 5-20 MG tablet Take 1 tablet by mouth daily.     gabapentin (NEURONTIN) 100 MG capsule Take 100 mg by mouth in the morning and at bedtime.     metoprolol tartrate (LOPRESSOR) 50 MG tablet Take 1 tablet (50 mg total) by mouth 2 (two) times daily. 180 tablet 3  acetaminophen (TYLENOL) 500 MG tablet Take 500 mg by mouth every 8 (eight) hours as needed for moderate pain. (Patient not taking: Reported on 03/03/2023)      aspirin EC 81 MG tablet Take 81 mg by mouth 2 (two) times a week. Swallow whole. (Patient not taking: Reported on 03/03/2023)     cyanocobalamin (VITAMIN B12) 1000 MCG tablet Take 1,000 mcg by mouth daily. (Patient not taking: Reported on 12/02/2022)     furosemide (LASIX) 20 MG tablet Take 1 tablet (20 mg total) by mouth daily. (Patient not taking: Reported on 12/02/2022) 90 tablet 3   Menthol, Topical Analgesic, (ICY HOT EX) Apply 1 Application topically daily as needed (pain). (Patient not taking: Reported on 03/03/2023)     metroNIDAZOLE (METROGEL) 0.75 % vaginal gel Place 1 Applicatorful vaginally at bedtime. (Patient not taking: Reported on 12/02/2022)     Multiple Vitamins-Minerals (WOMENS MULTI PO) Take 1 tablet by mouth 4 (four) times a week. (Patient not taking: Reported on 12/02/2022)     nicotine (NICODERM CQ - DOSED IN MG/24 HR) 7 mg/24hr patch Place 1 patch (7 mg total) onto the skin daily. (Patient not taking: Reported on 12/02/2022) 28 patch 0   potassium chloride (KLOR-CON) 10 MEQ tablet Take 1 tablet (10 mEq total) by mouth daily. (Patient not taking: Reported on 12/02/2022) 90 tablet 3   rosuvastatin (CRESTOR) 10 MG tablet Take 10 mg by mouth at bedtime. (Patient not taking: Reported on 03/03/2023)     thiamine (VITAMIN B1) 100 MG tablet Take 100 mg by mouth daily. (Patient not taking: Reported on 12/02/2022)     No current facility-administered medications for this visit.    Allergies:   Ibuprofen    Social History:  The patient  reports that she has been smoking cigarettes. She has a 8.5 pack-year smoking history. She has never used smokeless tobacco. She reports current alcohol use of about 6.0 standard drinks of alcohol per week. She reports current drug use. Drug: Marijuana.   Family History:  The patient's family history includes CAD in her mother; Cirrhosis in her father; Lupus in her sister.    ROS   - negative   Wt Readings from Last 3 Encounters:  03/03/23 144 lb 12.8  oz (65.7 kg)  12/02/22 150 lb 6.4 oz (68.2 kg)  08/29/22 144 lb (65.3 kg)    Physical Exam: Blood pressure (!) 132/95, pulse 90, height 5' 5.5" (1.664 m), weight 144 lb 12.8 oz (65.7 kg), SpO2 99%.  HYPERTENSION CONTROL Vitals:   03/03/23 0810 03/03/23 0849  BP: (!) 132/98 (!) 132/95    The patient's blood pressure is elevated above target today.  In order to address the patient's elevated BP: Blood pressure will be monitored at home to determine if medication changes need to be made.       GEN:  Well nourished, well developed in no acute distress HEENT: Normal NECK: No JVD; No carotid bruits LYMPHATICS: No lymphadenopathy CARDIAC: RRR , soft systolic murmur  RESPIRATORY:  Clear to auscultation without rales, wheezing or rhonchi  ABDOMEN: Soft, non-tender, non-distended MUSCULOSKELETAL:  No edema; No deformity  SKIN: Warm and dry NEUROLOGIC:  Alert and oriented x 3  EKG:     Recent Labs: 07/10/2022: Magnesium 2.0 07/11/2022: ALT 14 07/12/2022: B Natriuretic Peptide 531.1 07/16/2022: BUN 5; Hemoglobin 9.6; Platelets 344; Potassium 3.4; Sodium 134 01/06/2023: Creatinine, Ser 0.80    Lipid Panel    Component Value Date/Time   CHOL 162 04/25/2019 1045  TRIG 133 04/25/2019 1045   HDL 52 04/25/2019 1045   CHOLHDL 3.1 04/25/2019 1045   CHOLHDL 4.0 Ratio 11/12/2007 2044   VLDL 32 11/12/2007 2044   LDLCALC 87 04/25/2019 1045       Other studies Reviewed: Additional studies/ records that were reviewed today include: .  Cardiac Cath 04/10/16 Conclusion     LV end diastolic pressure is normal. There is moderate left ventricular systolic dysfunction. The left ventricular ejection fraction is 35% by visual estimate. There is no aortic valve stenosis. LV end diastolic pressure is normal.   Nonischemic cardiomyopathy.  Continue medical therapy.    Indications   Acute combined systolic and diastolic heart failure (HCC) [I50.41 (ICD-10-CM)]  Dyspnea [R06.00  (ICD-10-CM)]   Hemo Data   Flowsheet Row Most Recent Value  Fick Cardiac Output 4.36 L/min  Fick Cardiac Output Index 2.46 (L/min)/BSA  RA A Wave 9 mmHg  RA V Wave 6 mmHg  RA Mean 4 mmHg  RV Systolic Pressure 28 mmHg  RV Diastolic Pressure 3 mmHg  RV EDP 6 mmHg  PA Systolic Pressure 28 mmHg  PA Diastolic Pressure 13 mmHg  PA Mean 20 mmHg  PW A Wave 10 mmHg  PW V Wave 9 mmHg  PW Mean 8 mmHg  AO Systolic Pressure 128 mmHg  AO Diastolic Pressure 77 mmHg  AO Mean 99 mmHg  LV Systolic Pressure 142 mmHg  LV Diastolic Pressure 7 mmHg  LV EDP 12 mmHg  Arterial Occlusion Pressure Extended Systolic Pressure 126 mmHg  Arterial Occlusion Pressure Extended Diastolic Pressure 76 mmHg  Arterial Occlusion Pressure Extended Mean Pressure 98 mmHg  Left Ventricular Apex Extended Systolic Pressure 137 mmHg  Left Ventricular Apex Extended Diastolic Pressure 8 mmHg  Left Ventricular Apex Extended EDP Pressure 9 mmHg  QP/QS 1  TPVR Index 8.13 HRUI  TSVR Index 40.21 HRUI  PVR SVR Ratio 0.13  TPVR/TSVR Ratio    ECHO: 04/08/16 Study Conclusions   - Left ventricle: The cavity size was mildly dilated. Wall   thickness was increased in a pattern of mild LVH. The estimated   ejection fraction was 35%. Diffuse hypokinesis. Features are   consistent with a pseudonormal left ventricular filling pattern,   with concomitant abnormal relaxation and increased filling   pressure (grade 2 diastolic dysfunction). - Aortic valve: Trileaflet; mildly calcified leaflets. There was no   stenosis. There was moderate regurgitation. There was no   holodiastolic flow reversal noted in the descending thoracic   aorta. - Mitral valve: There was moderate to severe regurgitation directed   centrally. - Left atrium: The atrium was mildly dilated. - Right ventricle: The cavity size was normal. Systolic function   was normal. - Pulmonary arteries: No complete TR doppler jet so unable to   estimate PA systolic  pressure. - Systemic veins: IVC measured 1.7 cm with < 50% respirophasic   variation, suggesting RA pressure 8 mmHg.   Impressions:   - Mildly dilated LV with mild LV hypertrophy. EF 35%, diffuse   hypokinesis. Moderate diastolic dysfunction. Moderate aortic   insufficiency with trileaflet aortic valve. Moderate to severe   mitral regurgitation. MR was central, no definite leaflet   abnormalities noted.     ASSESSMENT AND PLAN:  1.  Acute combined systolic and diastolic heart failure (HCC)-    Echo from May 3 shows normal LV systolic and diastolic function .   Moderately elevated pulmonary artery pressures.      Unfortunately she has not been taking her  medications as prescribed.  I stressed the importance that she take her medications so that we can monitor how she is doing.  2.  Hyperlipidemia: Will check lipids today.  She has not been taking her rosuvastatin.  Will make an assessment of whether or not she needs it based on today's labs.     2.  Hypertensive heart disease with heart failure (HCC) her blood pressure is high today because she has not been taking her medications.    3. aortic insufficiency-      4.   ETOH abuse: .     5.   Mitral Regurgitation-      6.   Tobacco abuse.:    still smoking    Kristeen Miss, MD  03/03/2023 8:50 AM    [facility] [address] [place],  [address] [place], [place]  [address] Pager [phone] Phone: [phone]; Fax: [phone]

## 2023-03-03 ENCOUNTER — Encounter: Payer: Self-pay | Admitting: Cardiovascular Disease

## 2023-03-03 ENCOUNTER — Ambulatory Visit: Payer: 59 | Attending: Cardiovascular Disease | Admitting: Cardiovascular Disease

## 2023-03-03 VITALS — BP 132/95 | HR 90 | Ht 65.5 in | Wt 144.8 lb

## 2023-03-03 DIAGNOSIS — Z952 Presence of prosthetic heart valve: Secondary | ICD-10-CM | POA: Diagnosis not present

## 2023-03-03 DIAGNOSIS — E782 Mixed hyperlipidemia: Secondary | ICD-10-CM | POA: Diagnosis not present

## 2023-03-03 NOTE — Patient Instructions (Signed)
Medication Instructions:  Your physician recommends that you continue on your current medications as directed. Please refer to the Current Medication list given to you today.  *If you need a refill on your cardiac medications before your next appointment, please call your pharmacy*   Lab Work: Lipids, ALT, BMET today If you have labs (blood work) drawn today and your tests are completely normal, you will receive your results only by: MyChart Message (if you have MyChart) OR A paper copy in the mail If you have any lab test that is abnormal or we need to change your treatment, we will call you to review the results.   Testing/Procedures: NONE   Follow-Up: At [facility], you and your health needs are our priority.  As part of our continuing mission to provide you with exceptional heart care, we have created designated Provider Care Teams.  These Care Teams include your primary Cardiologist (physician) and Advanced Practice Providers (APPs -  Physician Assistants and Nurse Practitioners) who all work together to provide you with the care you need, when you need it.  We recommend signing up for the patient portal called "MyChart".  Sign up information is provided on this After Visit Summary.  MyChart is used to connect with patients for Virtual Visits (Telemedicine).  Patients are able to view lab/test results, encounter notes, upcoming appointments, etc.  Non-urgent messages can be sent to your provider as well.   To learn more about what you can do with MyChart, go to [email or web address].    Your next appointment:   6 month(s)  Provider:   Welton Flakes

## 2023-03-04 ENCOUNTER — Telehealth: Payer: Self-pay | Admitting: *Deleted

## 2023-03-04 DIAGNOSIS — Z79899 Other long term (current) drug therapy: Secondary | ICD-10-CM

## 2023-03-04 DIAGNOSIS — E876 Hypokalemia: Secondary | ICD-10-CM

## 2023-03-04 DIAGNOSIS — I351 Nonrheumatic aortic (valve) insufficiency: Secondary | ICD-10-CM

## 2023-03-04 LAB — LIPID PANEL
Chol/HDL Ratio: 4.3 ratio (ref 0.0–4.4)
Cholesterol, Total: 187 mg/dL (ref 100–199)
HDL: 44 mg/dL (ref >39)
LDL Chol Calc (NIH): 116 mg/dL — ABNORMAL HIGH (ref 0–99)
Triglycerides: 155 mg/dL — ABNORMAL HIGH (ref 0–149)
VLDL Cholesterol Cal: 27 mg/dL (ref 5–40)

## 2023-03-04 LAB — BASIC METABOLIC PANEL
BUN/Creatinine Ratio: 2 — ABNORMAL LOW (ref 9–23)
BUN: 3 mg/dL — ABNORMAL LOW (ref 6–24)
CO2: 26 mmol/L (ref 20–29)
Calcium: 9.2 mg/dL (ref 8.7–10.2)
Chloride: 95 mmol/L — ABNORMAL LOW (ref 96–106)
Creatinine, Ser: 0.79 mg/dL (ref 0.57–1.00)
Glucose: 105 mg/dL — ABNORMAL HIGH (ref 70–99)
Potassium: 2.9 mmol/L — CL (ref 3.5–5.2)
Sodium: 141 mmol/L (ref 134–144)
eGFR: 88 mL/min/{1.73_m2} (ref >59)

## 2023-03-04 LAB — ALT: ALT: 36 IU/L — ABNORMAL HIGH (ref 0–32)

## 2023-03-04 MED ORDER — FUROSEMIDE 20 MG PO TABS: 20.0000 mg | ORAL_TABLET | Freq: Every day | ORAL | 3 refills | Status: DC

## 2023-03-04 MED ORDER — POTASSIUM CHLORIDE ER 10 MEQ PO TBCR: 10.0000 meq | EXTENDED_RELEASE_TABLET | Freq: Every day | ORAL | 3 refills | Status: DC

## 2023-03-04 MED ORDER — ROSUVASTATIN CALCIUM 10 MG PO TABS: 10.0000 mg | ORAL_TABLET | Freq: Every day | ORAL | 2 refills | Status: AC

## 2023-03-04 NOTE — Telephone Encounter (Signed)
-----   Message from Kristeen Miss sent at 03/04/2023  8:05 AM EDT ----- Potassium is very low She was not taking her potassium with lasix  Please remind her to take Kdur with her lasix  Please recheck BMP in 3-4 weeks  LDL is elevated.   She has not been taking her rousvastatin

## 2023-03-04 NOTE — Telephone Encounter (Signed)
The patient has been notified of the result and verbalized understanding.  All questions (if any) were answered.  Pt indeed has not been taking her potassium as prescribed.    Advised her that she is to take her potassium everyday with her lasix.    Also advised her that she needs to take her crestor as prescribed.   Pt request that I send refills for all 3 of these medications to her confirmed pharmacy of choice.    Pt states she will pick them up shortly and start taking them as prescribed.  Reiterated to her to START taking her KDUR today, as well as her other prescribed medications. Advised her again to take a dose of KDUR today and daily as prescribed thereafter.   Pt aware she will need to come back into the office for repeat BMET in 3-4 weeks, to reassess levels.  Scheduled her repeat lab work to check a BMET for 03/27/23.   Pt verbalized understanding and agrees with this plan.

## 2023-03-27 ENCOUNTER — Ambulatory Visit: Payer: 59 | Attending: Internal Medicine

## 2023-04-01 ENCOUNTER — Other Ambulatory Visit: Payer: Self-pay | Admitting: Cardiovascular Disease

## 2023-04-01 ENCOUNTER — Ambulatory Visit: Payer: 59 | Attending: Cardiovascular Disease

## 2023-04-01 ENCOUNTER — Telehealth: Payer: Self-pay | Admitting: Cardiovascular Disease

## 2023-04-01 DIAGNOSIS — I351 Nonrheumatic aortic (valve) insufficiency: Secondary | ICD-10-CM

## 2023-04-01 DIAGNOSIS — E876 Hypokalemia: Secondary | ICD-10-CM | POA: Diagnosis not present

## 2023-04-01 DIAGNOSIS — Z79899 Other long term (current) drug therapy: Secondary | ICD-10-CM

## 2023-04-01 MED ORDER — POTASSIUM CHLORIDE ER 10 MEQ PO TBCR
20.0000 meq | EXTENDED_RELEASE_TABLET | Freq: Every day | ORAL | Status: DC
Start: 2023-04-01 — End: 2024-02-10

## 2023-04-01 NOTE — Telephone Encounter (Signed)
Spoke with [facility] rep who reports critical lab result of K+2.9.   Spoke with Dr Eldridge Dace, DOD who recommends pt take KCL today and tomorrow 9/18 and 9/19 then will need to take daily and recheck BMET in 1 week.  Lab appointment scheduled for 04/09/2023.

## 2023-04-01 NOTE — Telephone Encounter (Signed)
Critical lab results. Please advise

## 2023-04-01 NOTE — Telephone Encounter (Signed)
Spoke with pt and advised of recommendations below.  Pt verbalizes understanding and agrees with current plan.  MyChart message also sent so pt could review recommendations in writing.

## 2023-04-02 ENCOUNTER — Telehealth: Payer: Self-pay

## 2023-04-02 LAB — BASIC METABOLIC PANEL
BUN/Creatinine Ratio: 4 — ABNORMAL LOW (ref 9–23)
BUN: 3 mg/dL — ABNORMAL LOW (ref 6–24)
CO2: 27 mmol/L (ref 20–29)
Calcium: 9.2 mg/dL (ref 8.7–10.2)
Chloride: 102 mmol/L (ref 96–106)
Creatinine, Ser: 0.81 mg/dL (ref 0.57–1.00)
Glucose: 99 mg/dL (ref 70–99)
Potassium: 2.9 mmol/L — CL (ref 3.5–5.2)
Sodium: 145 mmol/L — ABNORMAL HIGH (ref 134–144)
eGFR: 86 mL/min/{1.73_m2} (ref >59)

## 2023-04-02 MED ORDER — SPIRONOLACTONE 25 MG PO TABS: 25.0000 mg | ORAL_TABLET | Freq: Every day | ORAL | 3 refills | Status: DC

## 2023-04-02 NOTE — Telephone Encounter (Signed)
-----   Message from Kristeen Miss sent at 04/02/2023  1:32 PM EDT ----- I'm not sure if she is actually taking her Kdur,  please verify if she is indeed taking the potassium or did she run out ?  Lets start spironolactone 25 mg a day  Please schedule a nurse visit and bmp in 2 weeks.  Continue lasix and current kdur for now

## 2023-04-02 NOTE — Telephone Encounter (Signed)
The patient has been notified of the result and verbalized understanding.  All questions (if any) were answered. Cindi [place], RN 04/02/2023 4:18 PM   Patient stated she is taking her potassium and she started taking potassium as instructed by the DOD, Dr. Eldridge Dace yesterday. Patient stated she feels fine. Patient coming in on 04/16/23 to check lab work and see nurse for BP check.

## 2023-04-09 ENCOUNTER — Other Ambulatory Visit: Payer: 59

## 2023-04-14 ENCOUNTER — Emergency Department (HOSPITAL_COMMUNITY)
Admission: EM | Admit: 2023-04-14 | Discharge: 2023-04-14 | Disposition: A | Discharge status: Home / Self Care | Payer: 59 | Attending: Emergency Medicine | Admitting: Emergency Medicine

## 2023-04-14 ENCOUNTER — Encounter (HOSPITAL_COMMUNITY): Payer: Self-pay

## 2023-04-14 ENCOUNTER — Other Ambulatory Visit: Payer: Self-pay

## 2023-04-14 DIAGNOSIS — R197 Diarrhea, unspecified: Secondary | ICD-10-CM | POA: Diagnosis not present

## 2023-04-14 DIAGNOSIS — R252 Cramp and spasm: Secondary | ICD-10-CM | POA: Insufficient documentation

## 2023-04-14 DIAGNOSIS — N179 Acute kidney failure, unspecified: Secondary | ICD-10-CM | POA: Diagnosis not present

## 2023-04-14 DIAGNOSIS — E876 Hypokalemia: Secondary | ICD-10-CM | POA: Diagnosis not present

## 2023-04-14 DIAGNOSIS — Z85118 Personal history of other malignant neoplasm of bronchus and lung: Secondary | ICD-10-CM | POA: Insufficient documentation

## 2023-04-14 DIAGNOSIS — Z5329 Procedure and treatment not carried out because of patient's decision for other reasons: Secondary | ICD-10-CM | POA: Insufficient documentation

## 2023-04-14 LAB — BASIC METABOLIC PANEL
Anion gap: 12 (ref 5–15)
BUN: 5 mg/dL — ABNORMAL LOW (ref 6–20)
CO2: 23 mmol/L (ref 22–32)
Calcium: 8.9 mg/dL (ref 8.9–10.3)
Chloride: 102 mmol/L (ref 98–111)
Creatinine, Ser: 1.02 mg/dL — ABNORMAL HIGH (ref 0.44–1.00)
GFR, Estimated: >60 mL/min (ref >60)
Glucose, Bld: 149 mg/dL — ABNORMAL HIGH (ref 70–99)
Potassium: 3.2 mmol/L — ABNORMAL LOW (ref 3.5–5.1)
Sodium: 137 mmol/L (ref 135–145)

## 2023-04-14 LAB — CBC
HCT: 42.2 % (ref 36.0–46.0)
Hemoglobin: 13.7 g/dL (ref 12.0–15.0)
MCH: 29 pg (ref 26.0–34.0)
MCHC: 32.5 g/dL (ref 30.0–36.0)
MCV: 89.4 fL (ref 80.0–100.0)
Platelets: 250 10*3/uL (ref 150–400)
RBC: 4.72 MIL/uL (ref 3.87–5.11)
RDW: 14.7 % (ref 11.5–15.5)
WBC: 6.2 10*3/uL (ref 4.0–10.5)
nRBC: 0 % (ref 0.0–0.2)

## 2023-04-14 LAB — CK: Total CK: 74 U/L (ref 38–234)

## 2023-04-14 LAB — MAGNESIUM: Magnesium: 1.8 mg/dL (ref 1.7–2.4)

## 2023-04-14 NOTE — ED Notes (Signed)
Pt not in bed. Called pt phone with no answer. Did not see pt leave.

## 2023-04-14 NOTE — ED Provider Notes (Signed)
[place] EMERGENCY DEPARTMENT AT [facility] Provider Note   CSN: [id number] Arrival date & time: 04/14/23  4098     History Chief Complaint  Patient presents with   Body cramps/spasms   Low Potassium     HPI Rachael Stone is a 57 y.o. female presenting for evaluation of muscle cramps that have been happening for the last week and worsened yesterday. These come and go.She recently took potassium prescribed by PCP as it was low, but these have persisted. She denies any history of kidney disease, difficulty urinating. Endorses having had diarrhea since last night. She denies any fevers, chills, nausea or vomiting. She does not have any abdominal pain. She has a history of lung cancer and is s/p radiation therapy on the left. She denies any dyspnea today.   Patient's recorded medical, surgical, social, medication list and allergies were reviewed in the Snapshot window as part of the initial history.   Review of Systems   Review of Systems  Constitutional:  Positive for chills. Negative for fatigue and fever.  HENT:  Negative for congestion.   Respiratory:  Positive for cough and chest tightness.   Cardiovascular:  Negative for chest pain and leg swelling.  Gastrointestinal:  Positive for diarrhea. Negative for abdominal pain, constipation, nausea and vomiting.  Genitourinary:  Negative for difficulty urinating and dysuria.  Neurological:  Negative for tremors and weakness.   Physical Exam Updated Vital Signs BP (!) 130/92 (BP Location: Right Arm)   Pulse (!) 113   Temp 98.1 F (36.7 C)   Resp 16   Ht 5' 5.5" (1.664 m)   Wt 65.8 kg   LMP  (LMP Unknown)   SpO2 100%   BMI 23.76 kg/m  Physical Exam Constitutional:      General: She is not in acute distress.    Appearance: She is not ill-appearing or toxic-appearing.  HENT:     Head: Normocephalic and atraumatic.     Nose: No congestion.     Mouth/Throat:     Mouth: Mucous membranes are moist.  Eyes:      General: No scleral icterus.    Extraocular Movements: Extraocular movements intact.  Cardiovascular:     Rate and Rhythm: Normal rate and regular rhythm.     Heart sounds: No murmur heard.    No friction rub. No gallop.  Pulmonary:     Effort: Pulmonary effort is normal. No respiratory distress.     Breath sounds: No wheezing, rhonchi or rales.  Abdominal:     General: Abdomen is flat. There is no distension.     Tenderness: There is no abdominal tenderness. There is no guarding or rebound.  Musculoskeletal:     Right lower leg: No edema.     Left lower leg: No edema.  Skin:    General: Skin is warm.  Neurological:     General: No focal deficit present.     Mental Status: She is alert and oriented to person, place, and time.     Cranial Nerves: No cranial nerve deficit.     Motor: No weakness.    ED Course/ Medical Decision Making/ A&P Clinical Course as of 04/14/23 1252  Tue Apr 14, 2023  1149 Creatinine(!): 1.02 [CA]    Clinical Course User Index [CA] Manuela Neptune, MD     Medications Ordered in ED Medications - No data to display  Medical Decision Making:    Rachael Stone is a 57 y.o. female who presented  to the ED today with intermittent muscle cramping as detailed above.     Patient's presentation is complicated by their history of pneumonia.   Complete initial physical exam performed, notably the patient was feeling anxious, distressed and in pain. No neurological deficits were appreciated on exam.   Reviewed and confirmed nursing documentation for past medical history, family history, social history.    Initial Assessment:   With the patient's presentation, the most likely diagnosis is muscle spasms due to an electrolyte derrangement. Other diagnoses were considered including (but not limited to) rhabdomyolysis or acute infection.   Initial Plan:  Screening labs including CBC and Metabolic panel to evaluate for infectious or metabolic  etiology of disease.  CK and Magnesium  Objective evaluation as below reviewed with plan for close reassessment  Initial Study Results:   Laboratory  All laboratory results reviewed without evidence of clinically relevant pathology.    Mildly increased Cr.  Mildly decreased potassium.   Reassessment and Plan:    Patient had a mild hypokalemia along with a possibly viral gastroenteritis. She eloped from the ED not allowing re-evaluation or repletion of her electrolytes. Her potassium is improved from when she saw her PCP, stable.   Clinical Impression: No diagnosis found.   AMA   Final Clinical Impression(s) / ED Diagnoses Final diagnoses:  None    Rx / DC Orders ED Discharge Orders     None         Hassan Rowan, [place], MD 04/14/23 1540    Tegeler, Canary Brim, MD 04/16/23 1024

## 2023-04-14 NOTE — ED Triage Notes (Signed)
Pt came in via POV d/t muscle spasms & cramps intermittently all over her body the last week & then they were very bad yesterday. Pt reports her PCP told her that her Potassium was low & gave her a supplement & she doesn't feel like her s/s has gotten any better. A/Ox4, rates her pain 8/10 while in triage.

## 2023-04-16 ENCOUNTER — Ambulatory Visit: Payer: 59

## 2023-04-16 ENCOUNTER — Ambulatory Visit: Payer: 59 | Attending: Internal Medicine

## 2023-04-17 ENCOUNTER — Emergency Department (HOSPITAL_COMMUNITY): Payer: 59

## 2023-04-17 ENCOUNTER — Encounter (HOSPITAL_COMMUNITY): Payer: Self-pay

## 2023-04-17 ENCOUNTER — Other Ambulatory Visit: Payer: Self-pay

## 2023-04-17 ENCOUNTER — Observation Stay (HOSPITAL_COMMUNITY)
Admission: EM | Admit: 2023-04-17 | Discharge: 2023-04-18 | Disposition: A | Discharge status: Home / Self Care | Payer: 59 | Attending: Internal Medicine | Admitting: Internal Medicine

## 2023-04-17 DIAGNOSIS — R197 Diarrhea, unspecified: Secondary | ICD-10-CM | POA: Diagnosis present

## 2023-04-17 DIAGNOSIS — Z85118 Personal history of other malignant neoplasm of bronchus and lung: Secondary | ICD-10-CM | POA: Diagnosis not present

## 2023-04-17 DIAGNOSIS — Z952 Presence of prosthetic heart valve: Secondary | ICD-10-CM | POA: Insufficient documentation

## 2023-04-17 DIAGNOSIS — E861 Hypovolemia: Secondary | ICD-10-CM | POA: Insufficient documentation

## 2023-04-17 DIAGNOSIS — Z1152 Encounter for screening for COVID-19: Secondary | ICD-10-CM | POA: Diagnosis not present

## 2023-04-17 DIAGNOSIS — K219 Gastro-esophageal reflux disease without esophagitis: Secondary | ICD-10-CM | POA: Diagnosis not present

## 2023-04-17 DIAGNOSIS — I5030 Unspecified diastolic (congestive) heart failure: Secondary | ICD-10-CM | POA: Diagnosis not present

## 2023-04-17 DIAGNOSIS — N179 Acute kidney failure, unspecified: Secondary | ICD-10-CM | POA: Diagnosis not present

## 2023-04-17 DIAGNOSIS — F1020 Alcohol dependence, uncomplicated: Secondary | ICD-10-CM | POA: Diagnosis not present

## 2023-04-17 DIAGNOSIS — Z8679 Personal history of other diseases of the circulatory system: Secondary | ICD-10-CM | POA: Diagnosis not present

## 2023-04-17 DIAGNOSIS — Z79899 Other long term (current) drug therapy: Secondary | ICD-10-CM | POA: Insufficient documentation

## 2023-04-17 DIAGNOSIS — I110 Hypertensive heart disease with heart failure: Secondary | ICD-10-CM | POA: Insufficient documentation

## 2023-04-17 DIAGNOSIS — Z720 Tobacco use: Secondary | ICD-10-CM | POA: Diagnosis present

## 2023-04-17 DIAGNOSIS — J449 Chronic obstructive pulmonary disease, unspecified: Secondary | ICD-10-CM | POA: Insufficient documentation

## 2023-04-17 DIAGNOSIS — I10 Essential (primary) hypertension: Secondary | ICD-10-CM | POA: Diagnosis present

## 2023-04-17 DIAGNOSIS — E876 Hypokalemia: Secondary | ICD-10-CM | POA: Diagnosis not present

## 2023-04-17 DIAGNOSIS — E860 Dehydration: Principal | ICD-10-CM | POA: Insufficient documentation

## 2023-04-17 DIAGNOSIS — F17210 Nicotine dependence, cigarettes, uncomplicated: Secondary | ICD-10-CM | POA: Diagnosis not present

## 2023-04-17 DIAGNOSIS — I5032 Chronic diastolic (congestive) heart failure: Secondary | ICD-10-CM | POA: Diagnosis present

## 2023-04-17 DIAGNOSIS — K529 Noninfective gastroenteritis and colitis, unspecified: Secondary | ICD-10-CM | POA: Diagnosis present

## 2023-04-17 LAB — URINALYSIS, W/ REFLEX TO CULTURE (INFECTION SUSPECTED)
Glucose, UA: NEGATIVE mg/dL
Hgb urine dipstick: NEGATIVE
Ketones, ur: 5 mg/dL — AB
Nitrite: NEGATIVE
Protein, ur: 100 mg/dL — AB
Specific Gravity, Urine: 1.024 (ref 1.005–1.030)
pH: 5 (ref 5.0–8.0)

## 2023-04-17 LAB — CBC WITH DIFFERENTIAL/PLATELET
Abs Immature Granulocytes: 0.02 10*3/uL (ref 0.00–0.07)
Basophils Absolute: 0 10*3/uL (ref 0.0–0.1)
Basophils Relative: 1 %
Eosinophils Absolute: 0.1 10*3/uL (ref 0.0–0.5)
Eosinophils Relative: 1 %
HCT: 44.4 % (ref 36.0–46.0)
Hemoglobin: 14.6 g/dL (ref 12.0–15.0)
Immature Granulocytes: 0 %
Lymphocytes Relative: 25 %
Lymphs Abs: 1.7 10*3/uL (ref 0.7–4.0)
MCH: 30.3 pg (ref 26.0–34.0)
MCHC: 32.9 g/dL (ref 30.0–36.0)
MCV: 92.1 fL (ref 80.0–100.0)
Monocytes Absolute: 0.5 10*3/uL (ref 0.1–1.0)
Monocytes Relative: 8 %
Neutro Abs: 4.4 10*3/uL (ref 1.7–7.7)
Neutrophils Relative %: 65 %
Platelets: 250 10*3/uL (ref 150–400)
RBC: 4.82 MIL/uL (ref 3.87–5.11)
RDW: 14.7 % (ref 11.5–15.5)
WBC: 6.7 10*3/uL (ref 4.0–10.5)
nRBC: 0 % (ref 0.0–0.2)

## 2023-04-17 LAB — I-STAT CG4 LACTIC ACID, ED
Lactic Acid, Venous: 1.3 mmol/L (ref 0.5–1.9)
Lactic Acid, Venous: 1.1 mmol/L (ref 0.5–1.9)

## 2023-04-17 LAB — COMPREHENSIVE METABOLIC PANEL
ALT: 29 U/L (ref 0–44)
AST: 46 U/L — ABNORMAL HIGH (ref 15–41)
Albumin: 4.3 g/dL (ref 3.5–5.0)
Alkaline Phosphatase: 69 U/L (ref 38–126)
Anion gap: 9 (ref 5–15)
BUN: 10 mg/dL (ref 6–20)
CO2: 21 mmol/L — ABNORMAL LOW (ref 22–32)
Calcium: 9.6 mg/dL (ref 8.9–10.3)
Chloride: 104 mmol/L (ref 98–111)
Creatinine, Ser: 1.26 mg/dL — ABNORMAL HIGH (ref 0.44–1.00)
GFR, Estimated: 50 mL/min — ABNORMAL LOW (ref >60)
Glucose, Bld: 99 mg/dL (ref 70–99)
Potassium: 3.5 mmol/L (ref 3.5–5.1)
Sodium: 134 mmol/L — ABNORMAL LOW (ref 135–145)
Total Bilirubin: 0.7 mg/dL (ref 0.3–1.2)
Total Protein: 8.6 g/dL — ABNORMAL HIGH (ref 6.5–8.1)

## 2023-04-17 LAB — TSH: TSH: 0.669 u[IU]/mL (ref 0.350–4.500)

## 2023-04-17 LAB — RESP PANEL BY RT-PCR (RSV, FLU A&B, COVID)  RVPGX2
Influenza A by PCR: NEGATIVE
Influenza B by PCR: NEGATIVE
Resp Syncytial Virus by PCR: NEGATIVE
SARS Coronavirus 2 by RT PCR: NEGATIVE

## 2023-04-17 LAB — RAPID URINE DRUG SCREEN, HOSP PERFORMED
Amphetamines: NOT DETECTED
Barbiturates: NOT DETECTED
Benzodiazepines: NOT DETECTED
Cocaine: NOT DETECTED
Opiates: NOT DETECTED
Tetrahydrocannabinol: POSITIVE — AB

## 2023-04-17 LAB — BRAIN NATRIURETIC PEPTIDE: B Natriuretic Peptide: 24 pg/mL (ref 0.0–100.0)

## 2023-04-17 LAB — C DIFFICILE QUICK SCREEN W PCR REFLEX
C Diff antigen: NEGATIVE
C Diff interpretation: NOT DETECTED
C Diff toxin: NEGATIVE

## 2023-04-17 LAB — CK: Total CK: 92 U/L (ref 38–234)

## 2023-04-17 LAB — TROPONIN I (HIGH SENSITIVITY)
Troponin I (High Sensitivity): 3 ng/L (ref <18)
Troponin I (High Sensitivity): 4 ng/L (ref <18)

## 2023-04-17 LAB — PHOSPHORUS: Phosphorus: 4 mg/dL (ref 2.5–4.6)

## 2023-04-17 LAB — MAGNESIUM: Magnesium: 1.8 mg/dL (ref 1.7–2.4)

## 2023-04-17 MED ORDER — ACETAMINOPHEN 325 MG PO TABS: 650.0000 mg | ORAL_TABLET | Freq: Four times a day (QID) | ORAL | Status: DC | PRN

## 2023-04-17 MED ORDER — GABAPENTIN 100 MG PO CAPS
100.0000 mg | ORAL_CAPSULE | Freq: Two times a day (BID) | ORAL | Status: DC
  Administered 2023-04-17 – 2023-04-18 (×2): 100 mg via ORAL
  Filled 2023-04-17 (×2): qty 1

## 2023-04-17 MED ORDER — ASPIRIN 81 MG PO TBEC: 81.0000 mg | DELAYED_RELEASE_TABLET | ORAL | Status: DC

## 2023-04-17 MED ORDER — VITAMIN B-12 1000 MCG PO TABS
1000.0000 ug | ORAL_TABLET | Freq: Every day | ORAL | Status: DC
  Administered 2023-04-17 – 2023-04-18 (×2): 1000 ug via ORAL
  Filled 2023-04-17 (×2): qty 1

## 2023-04-17 MED ORDER — ENOXAPARIN SODIUM 40 MG/0.4ML IJ SOSY
40.0000 mg | PREFILLED_SYRINGE | INTRAMUSCULAR | Status: DC
  Administered 2023-04-17: 40 mg via SUBCUTANEOUS
  Filled 2023-04-17: qty 0.4

## 2023-04-17 MED ORDER — FENTANYL CITRATE PF 50 MCG/ML IJ SOSY
25.0000 ug | PREFILLED_SYRINGE | Freq: Once | INTRAMUSCULAR | Status: AC
  Administered 2023-04-17: 25 ug via INTRAVENOUS
  Filled 2023-04-17: qty 1

## 2023-04-17 MED ORDER — NICOTINE 14 MG/24HR TD PT24: 14.0000 mg | MEDICATED_PATCH | Freq: Every day | TRANSDERMAL | Status: DC

## 2023-04-17 MED ORDER — LACTATED RINGERS IV BOLUS
500.0000 mL | Freq: Once | INTRAVENOUS | Status: AC
  Administered 2023-04-17: 500 mL via INTRAVENOUS

## 2023-04-17 MED ORDER — PROCHLORPERAZINE EDISYLATE 10 MG/2ML IJ SOLN
10.0000 mg | Freq: Once | INTRAMUSCULAR | Status: AC
  Administered 2023-04-17: 10 mg via INTRAVENOUS
  Filled 2023-04-17: qty 2

## 2023-04-17 MED ORDER — POTASSIUM CHLORIDE IN NACL 20-0.9 MEQ/L-% IV SOLN
INTRAVENOUS | Status: DC
  Filled 2023-04-17 (×2): qty 1000

## 2023-04-17 MED ORDER — FENTANYL CITRATE PF 50 MCG/ML IJ SOSY: 25.0000 ug | PREFILLED_SYRINGE | INTRAMUSCULAR | Status: DC | PRN

## 2023-04-17 MED ORDER — AMLODIPINE BESYLATE 10 MG PO TABS
5.0000 mg | ORAL_TABLET | Freq: Every day | ORAL | Status: DC
  Administered 2023-04-17 – 2023-04-18 (×2): 5 mg via ORAL
  Filled 2023-04-17 (×2): qty 1

## 2023-04-17 MED ORDER — METOPROLOL TARTRATE 50 MG PO TABS
50.0000 mg | ORAL_TABLET | Freq: Two times a day (BID) | ORAL | Status: DC
  Administered 2023-04-17 – 2023-04-18 (×2): 50 mg via ORAL
  Filled 2023-04-17 (×2): qty 1

## 2023-04-17 MED ORDER — PANTOPRAZOLE SODIUM 40 MG IV SOLR
40.0000 mg | Freq: Once | INTRAVENOUS | Status: AC
  Administered 2023-04-17: 40 mg via INTRAVENOUS
  Filled 2023-04-17: qty 10

## 2023-04-17 MED ORDER — ACETAMINOPHEN 650 MG RE SUPP: 650.0000 mg | Freq: Four times a day (QID) | RECTAL | Status: DC | PRN

## 2023-04-17 MED ORDER — ONDANSETRON HCL 4 MG/2ML IJ SOLN: 4.0000 mg | Freq: Four times a day (QID) | INTRAMUSCULAR | Status: DC | PRN

## 2023-04-17 MED ORDER — ONDANSETRON HCL 4 MG PO TABS
4.0000 mg | ORAL_TABLET | Freq: Four times a day (QID) | ORAL | Status: DC | PRN
  Filled 2023-04-17: qty 1

## 2023-04-17 MED ORDER — ROSUVASTATIN CALCIUM 10 MG PO TABS
10.0000 mg | ORAL_TABLET | Freq: Every day | ORAL | Status: DC
  Administered 2023-04-17: 10 mg via ORAL
  Filled 2023-04-17: qty 1

## 2023-04-17 NOTE — ED Provider Notes (Signed)
[place] EMERGENCY DEPARTMENT AT [facility] Provider Note   CSN: [id number] Arrival date & time: 04/17/23  9629     History  Chief Complaint  Patient presents with   Abdominal Cramping         Rachael Stone is a 57 y.o. female.  HPI Patient reports has been having cramps in her legs and arms for several days.  She reports she has felt generally weak and somewhat lightheaded.  However she has continued to do most of her usual activities.  She reports that she has had her vision get somewhat blurry.  She reports she was having a lot of difficulty at work due to her symptoms.  She was on her way to work this morning and then with driving her vision was just blurry and she felt weak so she came to the emergency department.  She had been seen 3 days earlier and was treated for hypokalemia, but ended up leaving before completing treatment.  She reports she has had some loose stool.  At baseline she has 2 bowel movements per day.  She reports for the past several days she is having multiple bowel movements per day.  Stool was green.  Not bloody looking.  She denies she is having abdominal pain just frequency of stool.  She has also been taking Lasix daily and potassium with it.  She reports since her cardiology appointment at least for several weeks now, she has been taking 20 mg of Lasix a day, to potassium supplements and spironolactone.  She denies any chest pain or shortness of breath.  No headache.  Patient denies she has been feeling significantly lightheaded.  She reports this morning taking out the trash cans was the first time she really felt lightheaded and then while she was driving.  She has not had any syncope episodes.    Home Medications Prior to Admission medications   Medication Sig Start Date End Date Taking? Authorizing Provider  acetaminophen (TYLENOL) 500 MG tablet Take 500 mg by mouth every 8 (eight) hours as needed for moderate pain. Patient not taking:  Reported on 03/03/2023    [provider]  amLODipine-olmesartan (AZOR) 5-20 MG tablet Take 1 tablet by mouth daily. 03/07/22   [provider]  aspirin EC 81 MG tablet Take 81 mg by mouth 2 (two) times a week. Swallow whole. Patient not taking: Reported on 03/03/2023    [provider]  cyanocobalamin (VITAMIN B12) 1000 MCG tablet Take 1,000 mcg by mouth daily. Patient not taking: Reported on 12/02/2022    [provider]  furosemide (LASIX) 20 MG tablet Take 1 tablet (20 mg total) by mouth daily. 03/04/23   Nahser, Deloris Ping, MD  gabapentin (NEURONTIN) 100 MG capsule Take 100 mg by mouth in the morning and at bedtime. 05/27/22   [provider]  Menthol, Topical Analgesic, (ICY HOT EX) Apply 1 Application topically daily as needed (pain). Patient not taking: Reported on 03/03/2023    [provider]  metoprolol tartrate (LOPRESSOR) 50 MG tablet Take 1 tablet (50 mg total) by mouth 2 (two) times daily. 12/02/22   Nahser, Deloris Ping, MD  metroNIDAZOLE (METROGEL) 0.75 % vaginal gel Place 1 Applicatorful vaginally at bedtime. Patient not taking: Reported on 12/02/2022 06/17/22   [provider]  Multiple Vitamins-Minerals (WOMENS MULTI PO) Take 1 tablet by mouth 4 (four) times a week. Patient not taking: Reported on 12/02/2022    [provider]  nicotine (NICODERM CQ -  DOSED IN MG/24 HR) 7 mg/24hr patch Place 1 patch (7 mg total) onto the skin daily. Patient not taking: Reported on 12/02/2022 07/17/22   Leary Roca, PA-C  potassium chloride (KLOR-CON) 10 MEQ tablet Take 2 tablets (20 mEq total) by mouth daily. 04/01/23   Corky Crafts, MD  rosuvastatin (CRESTOR) 10 MG tablet Take 1 tablet (10 mg total) by mouth at bedtime. 03/04/23   Nahser, Deloris Ping, MD  spironolactone (ALDACTONE) 25 MG tablet Take 1 tablet (25 mg total) by mouth daily. 04/02/23   Nahser, Deloris Ping, MD  thiamine (VITAMIN B1) 100 MG tablet Take 100 mg by mouth  daily. Patient not taking: Reported on 12/02/2022 07/18/22   [provider]      Allergies    Ibuprofen    Review of Systems   Review of Systems  Physical Exam Updated Vital Signs BP 107/84 (BP Location: Left Arm)   Pulse (!) 107   Temp 98.9 F (37.2 C) (Oral)   Resp (!) 24   Ht 5' 5.5" (1.664 m)   Wt 65.8 kg   LMP  (LMP Unknown)   SpO2 98%   BMI 23.76 kg/m  Physical Exam Constitutional:      Comments: Patient is alert and nontoxic.  Mental status is clear.  No respiratory distress at rest.  HENT:     Mouth/Throat:     Pharynx: Oropharynx is clear.  Eyes:     Extraocular Movements: Extraocular movements intact.  Cardiovascular:     Comments: Borderline tachycardia.  No gross rub murmur gallop. Pulmonary:     Effort: Pulmonary effort is normal.     Breath sounds: Normal breath sounds.  Abdominal:     General: There is no distension.     Palpations: Abdomen is soft.     Tenderness: There is no abdominal tenderness. There is no guarding.  Musculoskeletal:        General: No swelling or tenderness. Normal range of motion.     Right lower leg: No edema.     Left lower leg: No edema.     Comments: Calves  are soft and pliable.  No peripheral edema.  Skin:    General: Skin is warm and dry.  Neurological:     General: No focal deficit present.     Mental Status: She is oriented to person, place, and time.     Coordination: Coordination normal.  Psychiatric:        Mood and Affect: Mood normal.     ED Results / Procedures / Treatments   Labs (all labs ordered are listed, but only abnormal results are displayed) Labs Reviewed  COMPREHENSIVE METABOLIC PANEL - Abnormal; Notable for the following components:      Result Value   Sodium 134 (*)    CO2 21 (*)    Creatinine, Ser 1.26 (*)    Total Protein 8.6 (*)    AST 46 (*)    GFR, Estimated 50 (*)    All other components within normal limits  RAPID URINE DRUG SCREEN, HOSP PERFORMED - Abnormal; Notable for  the following components:   Tetrahydrocannabinol POSITIVE (*)    All other components within normal limits  URINALYSIS, W/ REFLEX TO CULTURE (INFECTION SUSPECTED) - Abnormal; Notable for the following components:   Color, Urine AMBER (*)    APPearance CLOUDY (*)    Bilirubin Urine SMALL (*)    Ketones, ur 5 (*)    Protein, ur 100 (*)  Leukocytes,Ua LARGE (*)    Bacteria, UA RARE (*)    All other components within normal limits  RESP PANEL BY RT-PCR (RSV, FLU A&B, COVID)  RVPGX2  C DIFFICILE QUICK SCREEN W PCR REFLEX    GASTROINTESTINAL PANEL BY PCR, STOOL (REPLACES STOOL CULTURE)  CBC WITH DIFFERENTIAL/PLATELET  BRAIN NATRIURETIC PEPTIDE  CK  MAGNESIUM  PHOSPHORUS  TSH  I-STAT CG4 LACTIC ACID, ED  I-STAT CG4 LACTIC ACID, ED  TROPONIN I (HIGH SENSITIVITY)  TROPONIN I (HIGH SENSITIVITY)    EKG EKG Interpretation Date/Time:  Friday April 17 2023 08:17:34 EDT Ventricular Rate:  130 PR Interval:  113 QRS Duration:  74 QT Interval:  278 QTC Calculation: 409 R Axis:   37  Text Interpretation: Sinus tachycardia LAE, consider biatrial enlargement Low voltage, precordial leads Borderline repolarization abnormality rate increased from previous. inferior st depression Confirmed by Arby Barrette [id number]) on 04/17/2023 8:32:55 AM  Radiology DG Chest Port 1 View  Result Date: 04/17/2023 CLINICAL DATA:  Hypotension.  History of primary lung adenocarcinoma EXAM: PORTABLE CHEST 1 VIEW COMPARISON:  X-ray 07/16/2022 and older. FINDINGS: Status post median sternotomy. Fiduciary marker along the left lung base. Mild linear changes in the left lung base. No consolidation, pneumothorax or effusion. No edema. Overlapping cardiac leads. Normal cardiopericardial silhouette. IMPRESSION: Postop chest. Left lung base fiduciary marker with some linear changes. No pneumothorax, effusion or new consolidation Electronically Signed   By: Karen Kays M.D.   On: 04/17/2023 10:31    Procedures Procedures    CRITICAL CARE Performed by: Arby Barrette   Total critical care time: 45 minutes  Critical care time was exclusive of separately billable procedures and treating other patients.  Critical care was necessary to treat or prevent imminent or life-threatening deterioration.  Critical care was time spent personally by me on the following activities: development of treatment plan with patient and/or surrogate as well as nursing, discussions with consultants, evaluation of patient's response to treatment, examination of patient, obtaining history from patient or surrogate, ordering and performing treatments and interventions, ordering and review of laboratory studies, ordering and review of radiographic studies, pulse oximetry and re-evaluation of patient's condition.  Medications Ordered in ED Medications  0.9 % NaCl with KCl 20 mEq/ L  infusion (has no administration in time range)  lactated ringers bolus 500 mL (0 mLs Intravenous Stopped 04/17/23 0930)  lactated ringers bolus 500 mL (0 mLs Intravenous Stopped 04/17/23 1220)    ED Course/ Medical Decision Making/ A&P                                 Medical Decision Making Amount and/or Complexity of Data Reviewed Labs: ordered. Radiology: ordered.  Risk Decision regarding hospitalization.   Patient presents as outlined.  She has had mostly myalgias and fatigue, and diarrhea for a couple days.  Three days ago had hypokalemia at 3.2 and was repleted.  Today patient is hypotensive on presentation.  However she does not have fever and does not have generally ill appearance.  She has no chest pain and no shortness of breath.  differential diagnosis includes dehydration\hypovolemia\sepsis\cardiogenic shock.  Will initiate lab work and volume resuscitation and 500 cc increments.  Patient does not have fever and does not have a likely source of infection.  She is not symptomatic with her hypotension.  Consult: Reviewed Dr. Cristal Deer  etiology, at this point with patient's valve repaired and EF at 50%, okay  to volume replete for suspected volume loss from diarrhea plus Lasix.  Patient on patient's clinical appearance I have highest suspicion for volume depletion due to combination of diarrhea plus diuretics.  At this time I do not have high suspicion for sepsis.  Will continue hydration and admission to hospitalist service.  Repeat 500 cc LR bolus.  Consult: Reviewed with Dr. Robb Matar [facility] hospitalist for admission.        Final Clinical Impression(s) / ED Diagnoses Final diagnoses:  Dehydration  AKI (acute kidney injury) (HCC)  Diarrhea of presumed infectious origin  Hypotension due to hypovolemia    Rx / DC Orders ED Discharge Orders     None         Arby Barrette, MD 04/17/23 1256

## 2023-04-17 NOTE — H&P (Signed)
History and Physical    Patient: Rachael Stone KVQ:[id number] DOB: 06-18-1966 DOA: 04/17/2023 DOS: the patient was seen and examined on 04/17/2023 PCP: [name], MD  Patient coming from: Home  Chief Complaint:  Chief Complaint  Patient presents with   Abdominal Cramping        HPI: Rachael Stone is a 57 y.o. female with medical history significant of Stage IA2, cT1bN0M0 left lung lingular NSCLC treated by an following with radiation therapy, arthritis, back COPD, dilated cardiomyopathy, dysfunctional uterine bleeding, dyspnea, EtOH abuse, hypertension, but hypertensive heart disease, moderate aortic insufficiency, status post AVR, moderate mitral regurgitation, tobacco abuse who presented to the emergency department with complaints of abdominal cramping, nausea, emesis and over 20 episodes of loose stools daily for the past 3 days.  Her vision has been blurry on occasion.   No constipation, melena or hematochezia.  No flank pain, dysuria, frequency or hematuria. She denied fever, chills, rhinorrhea, sore throat, wheezing or hemoptysis.  No chest pain, palpitations, diaphoresis, PND, orthopnea or pitting edema of the lower extremities.  No polyuria, polydipsia, polyphagia or blurred vision.   Lab work: Coronavirus, influenza and RSV PCR negative.  C. difficile antigen and toxin were negative.  Urine analysis was cloudy, small bilirubin, ketones of 5 and protein of 100 mg deciliter.  Leukocyte esterase is large.  RBC 11-20, WBC 21 and rare bacteria on microscopic examination.  CBC was normal with a white count of 6.7 with a normal differential, hemoglobin 14.6 g/dL and platelets 643.  CMP showed a sodium of 134 and CO2 of 21 mmol/L with a normal anion gap.  Creatinine was 1.26 mg/deciliter, total protein 8.6 g/dL and AST 46 units/L.  The rest of the CMP measurements were normal.  Lactic acid x 2 normal.  Normal phosphorus, troponin x 2, BNP, TSH and total CK.  Imaging: Portable 1  view chest radiograph postop change.  Left lung base few scarring marker with some linear changes.  No pneumothorax, effusion or new consolidation.   ED course: Initial vital signs were temperature 97.5 F, pulse 130, respiration 15, BP 88/68 mmHg and O2 sat 100% on room air.  The patient received 500 mL of LR bolus x 2.  Review of Systems: As mentioned in the history of present illness. All other systems reviewed and are negative. Past Medical History:  Diagnosis Date   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 04/08/2016 Echo: EF 35%, diff HK, mild LVH, Gr2 DD, mod AI, mod to sev MR, mildly dil LA.   COPD (chronic obstructive pulmonary disease) (HCC)    DEPRESSIVE DISORDER NOT ELSEWHERE CLASSIFIED 12/14/2008   no current problem per patient on 04/14/22.  Qualifier: Diagnosis of  By: Deirdre Peer MD, [name]   DYSFUNCTIONAL UTERINE BLEEDING 11/12/2007   Qualifier: Diagnosis of  By: Deirdre Peer MD, [name]     Dyspnea    ETOH abuse    Hypertension    Hypertensive heart disease with heart failure (HCC)    Marijuana abuse    Last use 04/14/22   MENORRHAGIA 12/14/2008   Qualifier: Diagnosis of  By: Deirdre Peer MD, [name]     Moderate aortic insufficiency    a. 04/08/2016 Echo: mod AI.   Moderate to Severe Mitral Regurgitation    a. 04/08/2016 Echo: mod-sev MR directed centrally.   Tobacco abuse    Past Surgical History:  Procedure Laterality Date  AORTIC VALVE REPLACEMENT N/A 07/09/2022   Procedure: AORTIC VALVE REPLACEMENT (AVR) USING INSPIRIS RESILIA AORTIC VALVE;  Surgeon: Corliss Skains, MD;  Location: [facility] OR;  Service: Open Heart Surgery;  Laterality: N/A;   BRONCHIAL BIOPSY  04/15/2022   Procedure: BRONCHIAL BIOPSIES;  Surgeon: Josephine Igo, DO;  Location: [facility] ENDOSCOPY;  Service: Pulmonary;;   BRONCHIAL NEEDLE ASPIRATION BIOPSY  04/15/2022   Procedure: BRONCHIAL NEEDLE ASPIRATION BIOPSIES;  Surgeon: Josephine Igo, DO;   Location: [facility] ENDOSCOPY;  Service: Pulmonary;;   BUBBLE STUDY  05/12/2022   Procedure: BUBBLE STUDY;  Surgeon: Sande Rives, MD;  Location: [facility] ENDOSCOPY;  Service: Cardiovascular;;   CARDIAC CATHETERIZATION N/A 04/10/2016   Procedure: Right/Left Heart Cath and Coronary Angiography;  Surgeon: Corky Crafts, MD;  Location: [facility] INVASIVE CV LAB;  Service: Cardiovascular;  Laterality: N/A;   EXPLORATORY LAPAROTOMY  07/14/2001   Ectopic Pregnancy - unsure which side or if tube was removed   RIGHT/LEFT HEART CATH AND CORONARY ANGIOGRAPHY N/A 05/12/2022   Procedure: RIGHT/LEFT HEART CATH AND CORONARY ANGIOGRAPHY;  Surgeon: Tonny Bollman, MD;  Location: [facility] INVASIVE CV LAB;  Service: Cardiovascular;  Laterality: N/A;   TEE WITHOUT CARDIOVERSION N/A 06/26/2017   Procedure: TRANSESOPHAGEAL ECHOCARDIOGRAM (TEE);  Surgeon: Elease Hashimoto Deloris Ping, MD;  Location: [facility] ENDOSCOPY;  Service: Cardiovascular;  Laterality: N/A;   TEE WITHOUT CARDIOVERSION N/A 05/12/2022   Procedure: TRANSESOPHAGEAL ECHOCARDIOGRAM (TEE);  Surgeon: Sande Rives, MD;  Location: [facility] ENDOSCOPY;  Service: Cardiovascular;  Laterality: N/A;   TEE WITHOUT CARDIOVERSION N/A 07/09/2022   Procedure: TRANSESOPHAGEAL ECHOCARDIOGRAM (TEE);  Surgeon: Corliss Skains, MD;  Location: [facility] OR;  Service: Open Heart Surgery;  Laterality: N/A;   TONSILLECTOMY     TOOTH EXTRACTION N/A 06/26/2022   Procedure: DENTAL RESTORATION/EXTRACTIONS;  Surgeon: Sharman Cheek, DMD;  Location: [facility] OR;  Service: Dentistry;  Laterality: N/A;   TUBAL LIGATION     Social History:  reports that she has been smoking cigarettes. She has a 8.5 pack-year smoking history. She has never used smokeless tobacco. She reports current alcohol use of about 6.0 standard drinks of alcohol per week. She reports current drug use. Drug: Marijuana.  Allergies  Allergen Reactions   Ibuprofen Other (See Comments)    Irritates stomach    Family History  Problem  Relation Age of Onset   CAD Mother        First MI @ 46 - 8 total.   Cirrhosis Father        alcoholic - died in his 76's.   Lupus Sister    Colon cancer Neg Hx    Colon polyps Neg Hx     Prior to Admission medications   Medication Sig Start Date End Date Taking? Authorizing Provider  acetaminophen (TYLENOL) 500 MG tablet Take 500 mg by mouth every 8 (eight) hours as needed for moderate pain. Patient not taking: Reported on 03/03/2023    [provider]  amLODipine-olmesartan (AZOR) 5-20 MG tablet Take 1 tablet by mouth daily. 03/07/22   [provider]  aspirin EC 81 MG tablet Take 81 mg by mouth 2 (two) times a week. Swallow whole. Patient not taking: Reported on 03/03/2023    [provider]  cyanocobalamin (VITAMIN B12) 1000 MCG tablet Take 1,000 mcg by mouth daily. Patient not taking: Reported on 12/02/2022    [provider]  furosemide (LASIX) 20 MG tablet Take 1 tablet (20 mg total) by mouth daily. 03/04/23   Nahser, Deloris Ping,  MD  gabapentin (NEURONTIN) 100 MG capsule Take 100 mg by mouth in the morning and at bedtime. 05/27/22   [provider]  Menthol, Topical Analgesic, (ICY HOT EX) Apply 1 Application topically daily as needed (pain). Patient not taking: Reported on 03/03/2023    [provider]  metoprolol tartrate (LOPRESSOR) 50 MG tablet Take 1 tablet (50 mg total) by mouth 2 (two) times daily. 12/02/22   Nahser, Deloris Ping, MD  metroNIDAZOLE (METROGEL) 0.75 % vaginal gel Place 1 Applicatorful vaginally at bedtime. Patient not taking: Reported on 12/02/2022 06/17/22   [provider]  Multiple Vitamins-Minerals (WOMENS MULTI PO) Take 1 tablet by mouth 4 (four) times a week. Patient not taking: Reported on 12/02/2022    [provider]  nicotine (NICODERM CQ - DOSED IN MG/24 HR) 7 mg/24hr patch Place 1 patch (7 mg total) onto the skin daily. Patient not taking: Reported on 12/02/2022 07/17/22   Leary Roca,  PA-C  potassium chloride (KLOR-CON) 10 MEQ tablet Take 2 tablets (20 mEq total) by mouth daily. 04/01/23   Corky Crafts, MD  rosuvastatin (CRESTOR) 10 MG tablet Take 1 tablet (10 mg total) by mouth at bedtime. 03/04/23   Nahser, Deloris Ping, MD  spironolactone (ALDACTONE) 25 MG tablet Take 1 tablet (25 mg total) by mouth daily. 04/02/23   Nahser, Deloris Ping, MD  thiamine (VITAMIN B1) 100 MG tablet Take 100 mg by mouth daily. Patient not taking: Reported on 12/02/2022 07/18/22   [provider]    Physical Exam: Vitals:   04/17/23 1048 04/17/23 1051 04/17/23 1100 04/17/23 1200  BP: 92/77 92/76 (!) 80/67 (!) 87/67  Pulse: 99 (!) 101 (!) 115 (!) 112  Resp: 19 (!) 27 (!) 24 18  Temp:      TempSrc:      SpO2: 100% 100% 97% 98%  Weight:      Height:       Physical Exam Vitals and nursing note reviewed.  Constitutional:      General: She is awake. She is not in acute distress.    Appearance: Normal appearance. She is normal weight. She is ill-appearing.  HENT:     Head: Normocephalic.     Nose: No rhinorrhea.     Mouth/Throat:     Mouth: Mucous membranes are dry.  Eyes:     General: No scleral icterus.    Pupils: Pupils are equal, round, and reactive to light.  Neck:     Vascular: No JVD.  Cardiovascular:     Rate and Rhythm: Normal rate and regular rhythm.     Heart sounds: S1 normal and S2 normal.  Pulmonary:     Effort: Pulmonary effort is normal.  Abdominal:     General: Bowel sounds are normal.     Palpations: Abdomen is soft.     Tenderness: There is abdominal tenderness. There is no right CVA tenderness, left CVA tenderness or guarding.  Musculoskeletal:     Cervical back: Neck supple.     Right lower leg: No edema.     Left lower leg: No edema.  Skin:    General: Skin is warm and dry.  Neurological:     General: No focal deficit present.     Mental Status: She is alert and oriented to person, place, and time.  Psychiatric:        Mood and Affect: Mood  normal.        Behavior: Behavior normal. Behavior is cooperative.  Data Reviewed:  Results are pending, will review when available.  08/14/2022 transthoracic echocardiogram IMPRESSIONS:   1. Left ventricular ejection fraction, by estimation, is 55%. The left  ventricle has low normal function. The left ventricle has no regional wall  motion abnormalities. Left ventricular diastolic parameters are consistent  with Grade I diastolic  dysfunction (impaired relaxation).   2. Right ventricular systolic function is normal. The right ventricular  size is normal. Tricuspid regurgitation signal is inadequate for assessing  PA pressure.   3. The mitral valve is grossly normal. Trivial mitral valve  regurgitation.   4. The aortic valve has been repaired/replaced. Aortic valve  regurgitation is not visualized. There is a 23 mm Inspiris Resilia valve  present in the aortic position. Echo findings are consistent with normal  structure and function of the aortic valve  prosthesis. Aortic valve area, by VTI measures 2.66 cm. Aortic valve mean  gradient measures 10.5 mmHg. Aortic valve Vmax measures 2.14 m/s. DI 0.7   5. The inferior vena cava is normal in size with greater than 50%  respiratory variability, suggesting right atrial pressure of 3 mmHg.   Assessment and Plan: Principal Problem:   AKI (acute kidney injury) (HCC) In the setting of:   Acute gastroenteritis Observation/telemetry. Continue IV fluids. Hold diuretic. Avoid hypotension. Avoid nephrotoxins. Monitor intake and output. Monitor renal function electrolytes. Full liquid diet. Pantoprazole 40 mg IVP x 1. -Then pantoprazole 40 mg p.o. daily. Follow-up GI pathogen PCR.  Active Problems:   GASTROESOPHAGEAL REFLUX, NO ESOPHAGITIS On PPI as above.    Tobacco abuse Tobacco cessation advised. Nicotine replacement therapy ordered.    Essential hypertension Hold diuretics. Continue metoprolol 80 mg p.o. twice  daily. Continue amlodipine 5 mg p.o. twice daily.    Alcoholism (HCC) In remission per patient.    Hypokalemia Resolved. Will continue supplementation though. Follow-up potassium level.    HFimpEF (heart failure with improved EF) Volume depleted. Holding diuretics. Continue beta-blocker twice daily. Follow-up with cardiology as an outpatient.    Advance Care Planning:   Code Status: Full Code   Consults:   Family Communication:   Severity of Illness: The appropriate patient status for this patient is OBSERVATION. Observation status is judged to be reasonable and necessary in order to provide the required intensity of service to ensure the patient's safety. The patient's presenting symptoms, physical exam findings, and initial radiographic and laboratory data in the context of their medical condition is felt to place them at decreased risk for further clinical deterioration. Furthermore, it is anticipated that the patient will be medically stable for discharge from the hospital within 2 midnights of admission.   Author: Bobette Mo, MD 04/17/2023 12:29 PM  For on call review [email or web address].   This document was prepared using Dragon voice recognition software and may contain some unintended transcription errors.

## 2023-04-17 NOTE — ED Notes (Signed)
Pt attempted to use the bathroom but was not able to at this time.

## 2023-04-17 NOTE — ED Notes (Signed)
ED TO INPATIENT HANDOFF REPORT  ED Nurse Name and Phone #: Rebecca Eaton RN  S Name/Age/Gender Rachael Stone 57 y.o. female Room/Bed: WA17/WA17  Code Status   Code Status: Full Code  Home/SNF/Other Home Patient oriented to: self, place, time, and situation Is this baseline? Yes   Triage Complete: Triage complete  Chief Complaint AKI (acute kidney injury) (HCC) [N17.9]  Triage Note Pt complaining of generalized cramps, and blurry vision. Cramps since Tuesday, and blurry vision since Wednesday. Pt was seen at [facility] recently, was told she had hypokalemia. Denies any new pain, pt does endorse nausea, diarrhea, fevers/chills.     Allergies Allergies  Allergen Reactions   Ibuprofen Other (See Comments)    Irritates stomach    Level of Care/Admitting Diagnosis ED Disposition     ED Disposition  Admit   Condition  --   Comment  Hospital Area: [facility] [[id number]]  Level of Care: Telemetry [5]  Admit to tele based on following criteria: Monitor QTC interval  May place patient in observation at [facility] or [facility] if equivalent level of care is available:: No  Covid Evaluation: Asymptomatic - no recent exposure (last 10 days) testing not required  Diagnosis: AKI (acute kidney injury) [facility]) [[id number]]  Admitting Physician: Bobette Mo [[id number]]  Attending Physician: Bobette Mo [[id number]]          B Medical/Surgery History Past Medical History:  Diagnosis Date   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 04/08/2016 Echo: EF 35%, diff HK, mild LVH, Gr2 DD, mod AI, mod to sev MR, mildly dil LA.   COPD (chronic obstructive pulmonary disease) (HCC)    DEPRESSIVE DISORDER NOT ELSEWHERE CLASSIFIED 12/14/2008   no current problem per patient on 04/14/22.  Qualifier: Diagnosis of  By: Deirdre Peer MD, [name]   DYSFUNCTIONAL UTERINE BLEEDING 11/12/2007   Qualifier:  Diagnosis of  By: Deirdre Peer MD, [name]     Dyspnea    ETOH abuse    Hypertension    Hypertensive heart disease with heart failure (HCC)    Marijuana abuse    Last use 04/14/22   MENORRHAGIA 12/14/2008   Qualifier: Diagnosis of  By: Deirdre Peer MD, [name]     Moderate aortic insufficiency    a. 04/08/2016 Echo: mod AI.   Moderate to Severe Mitral Regurgitation    a. 04/08/2016 Echo: mod-sev MR directed centrally.   Tobacco abuse    Past Surgical History:  Procedure Laterality Date   AORTIC VALVE REPLACEMENT N/A 07/09/2022   Procedure: AORTIC VALVE REPLACEMENT (AVR) USING INSPIRIS RESILIA AORTIC VALVE;  Surgeon: Corliss Skains, MD;  Location: [facility] OR;  Service: Open Heart Surgery;  Laterality: N/A;   BRONCHIAL BIOPSY  04/15/2022   Procedure: BRONCHIAL BIOPSIES;  Surgeon: Josephine Igo, DO;  Location: [facility] ENDOSCOPY;  Service: Pulmonary;;   BRONCHIAL NEEDLE ASPIRATION BIOPSY  04/15/2022   Procedure: BRONCHIAL NEEDLE ASPIRATION BIOPSIES;  Surgeon: Josephine Igo, DO;  Location: [facility] ENDOSCOPY;  Service: Pulmonary;;   BUBBLE STUDY  05/12/2022   Procedure: BUBBLE STUDY;  Surgeon: Sande Rives, MD;  Location: [facility] ENDOSCOPY;  Service: Cardiovascular;;   CARDIAC CATHETERIZATION N/A 04/10/2016   Procedure: Right/Left Heart Cath and Coronary Angiography;  Surgeon: Corky Crafts, MD;  Location: [facility] INVASIVE CV LAB;  Service: Cardiovascular;  Laterality: N/A;   EXPLORATORY LAPAROTOMY  07/14/2001   Ectopic Pregnancy -  unsure which side or if tube was removed   RIGHT/LEFT HEART CATH AND CORONARY ANGIOGRAPHY N/A 05/12/2022   Procedure: RIGHT/LEFT HEART CATH AND CORONARY ANGIOGRAPHY;  Surgeon: Tonny Bollman, MD;  Location: [facility] INVASIVE CV LAB;  Service: Cardiovascular;  Laterality: N/A;   TEE WITHOUT CARDIOVERSION N/A 06/26/2017   Procedure: TRANSESOPHAGEAL ECHOCARDIOGRAM (TEE);  Surgeon: Elease Hashimoto Deloris Ping, MD;  Location: [facility] ENDOSCOPY;  Service: Cardiovascular;  Laterality: N/A;   TEE WITHOUT  CARDIOVERSION N/A 05/12/2022   Procedure: TRANSESOPHAGEAL ECHOCARDIOGRAM (TEE);  Surgeon: Sande Rives, MD;  Location: [facility] ENDOSCOPY;  Service: Cardiovascular;  Laterality: N/A;   TEE WITHOUT CARDIOVERSION N/A 07/09/2022   Procedure: TRANSESOPHAGEAL ECHOCARDIOGRAM (TEE);  Surgeon: Corliss Skains, MD;  Location: [facility] OR;  Service: Open Heart Surgery;  Laterality: N/A;   TONSILLECTOMY     TOOTH EXTRACTION N/A 06/26/2022   Procedure: DENTAL RESTORATION/EXTRACTIONS;  Surgeon: Sharman Cheek, DMD;  Location: [facility] OR;  Service: Dentistry;  Laterality: N/A;   TUBAL LIGATION       A IV Location/Drains/Wounds Patient Lines/Drains/Airways Status     Active Line/Drains/Airways     Name Placement date Placement time Site Days   Peripheral IV 04/17/23 20 G Right Antecubital 04/17/23  0840  Antecubital  less than 1            Intake/Output Last 24 hours  Intake/Output Summary (Last 24 hours) at 04/17/2023 1548 Last data filed at 04/17/2023 1220 Gross per 24 hour  Intake 1007.23 ml  Output --  Net 1007.23 ml    Labs/Imaging Results for orders placed or performed during the hospital encounter of 04/17/23 (from the past 48 hour(s))  Comprehensive metabolic panel     Status: Abnormal   Collection Time: 04/17/23  8:45 AM  Result Value Ref Range   Sodium 134 (L) 135 - 145 mmol/L   Potassium 3.5 3.5 - 5.1 mmol/L   Chloride 104 98 - 111 mmol/L   CO2 21 (L) 22 - 32 mmol/L   Glucose, Bld 99 70 - 99 mg/dL    Comment: Glucose reference range applies only to samples taken after fasting for at least 8 hours.   BUN 10 6 - 20 mg/dL   Creatinine, Ser 1.61 (H) 0.44 - 1.00 mg/dL   Calcium 9.6 8.9 - 09.6 mg/dL   Total Protein 8.6 (H) 6.5 - 8.1 g/dL   Albumin 4.3 3.5 - 5.0 g/dL   AST 46 (H) 15 - 41 U/L   ALT 29 0 - 44 U/L   Alkaline Phosphatase 69 38 - 126 U/L   Total Bilirubin 0.7 0.3 - 1.2 mg/dL   GFR, Estimated 50 (L) >60 mL/min    Comment: (NOTE) Calculated using the CKD-EPI  Creatinine Equation (2021)    Anion gap 9 5 - 15    Comment: Performed at [facility], [address] [place], [place] [address]  CBC with Differential     Status: None   Collection Time: 04/17/23  8:45 AM  Result Value Ref Range   WBC 6.7 4.0 - 10.5 K/uL   RBC 4.82 3.87 - 5.11 MIL/uL   Hemoglobin 14.6 12.0 - 15.0 g/dL   HCT 98.1 19.1 - 47.8 %   MCV 92.1 80.0 - 100.0 fL   MCH 30.3 26.0 - 34.0 pg   MCHC 32.9 30.0 - 36.0 g/dL   RDW 29.5 62.1 - 30.8 %   Platelets 250 150 - 400 K/uL   nRBC 0.0 0.0 - 0.2 %   Neutrophils  Relative % 65 %   Neutro Abs 4.4 1.7 - 7.7 K/uL   Lymphocytes Relative 25 %   Lymphs Abs 1.7 0.7 - 4.0 K/uL   Monocytes Relative 8 %   Monocytes Absolute 0.5 0.1 - 1.0 K/uL   Eosinophils Relative 1 %   Eosinophils Absolute 0.1 0.0 - 0.5 K/uL   Basophils Relative 1 %   Basophils Absolute 0.0 0.0 - 0.1 K/uL   Immature Granulocytes 0 %   Abs Immature Granulocytes 0.02 0.00 - 0.07 K/uL    Comment: Performed at [facility], [address] [place], [place] [address]  Troponin I (High Sensitivity)     Status: None   Collection Time: 04/17/23  8:45 AM  Result Value Ref Range   Troponin I (High Sensitivity) 3 <18 ng/L    Comment: (NOTE) Elevated high sensitivity troponin I (hsTnI) values and significant  changes across serial measurements may suggest ACS but many other  chronic and acute conditions are known to elevate hsTnI results.  Refer to the "Links" section for chest pain algorithms and additional  guidance. Performed at [facility], [address], [place], [place] [address]   Brain natriuretic peptide     Status: None   Collection Time: 04/17/23  8:45 AM  Result Value Ref Range   B Natriuretic Peptide 24.0 0.0 - 100.0 pg/mL    Comment: Performed at [facility], [address] [place], [place] [address]  CK     Status: None   Collection Time: 04/17/23  8:45 AM  Result Value  Ref Range   Total CK 92 38 - 234 U/L    Comment: Performed at [facility], [address], [place], [place] [address]  Magnesium     Status: None   Collection Time: 04/17/23  8:45 AM  Result Value Ref Range   Magnesium 1.8 1.7 - 2.4 mg/dL    Comment: Performed at [facility], [address] [place], [place] [address]  Phosphorus     Status: None   Collection Time: 04/17/23  8:45 AM  Result Value Ref Range   Phosphorus 4.0 2.5 - 4.6 mg/dL    Comment: Performed at [facility] [address] [place], [place] [address]  TSH     Status: None   Collection Time: 04/17/23  8:45 AM  Result Value Ref Range   TSH 0.669 0.350 - 4.500 uIU/mL    Comment: Performed by a 3rd Generation assay with a functional sensitivity of <=0.01 uIU/mL. Performed at [facility], [address], [place], [place] [address]   I-Stat Lactic Acid, ED     Status: None   Collection Time: 04/17/23  8:57 AM  Result Value Ref Range   Lactic Acid, Venous 1.3 0.5 - 1.9 mmol/L  Troponin I (High Sensitivity)     Status: None   Collection Time: 04/17/23 10:33 AM  Result Value Ref Range   Troponin I (High Sensitivity) 4 <18 ng/L    Comment: (NOTE) Elevated high sensitivity troponin I (hsTnI) values and significant  changes across serial measurements may suggest ACS but many other  chronic and acute conditions are known to elevate hsTnI results.  Refer to the "Links" section for chest pain algorithms and additional  guidance. Performed at [facility], [address] [place], [place] [address]   I-Stat Lactic Acid, ED     Status: None   Collection Time: 04/17/23 10:46  AM  Result Value Ref Range   Lactic Acid, Venous 1.1 0.5 - 1.9 mmol/L  Urine rapid drug screen (hosp performed)     Status: Abnormal   Collection Time: 04/17/23 10:57 AM  Result Value Ref Range   Opiates NONE DETECTED NONE DETECTED   Cocaine NONE  DETECTED NONE DETECTED   Benzodiazepines NONE DETECTED NONE DETECTED   Amphetamines NONE DETECTED NONE DETECTED   Tetrahydrocannabinol POSITIVE (A) NONE DETECTED   Barbiturates NONE DETECTED NONE DETECTED    Comment: (NOTE) DRUG SCREEN FOR MEDICAL PURPOSES ONLY.  IF CONFIRMATION IS NEEDED FOR ANY PURPOSE, NOTIFY LAB WITHIN 5 DAYS.  LOWEST DETECTABLE LIMITS FOR URINE DRUG SCREEN Drug Class                     Cutoff (ng/mL) Amphetamine and metabolites    1000 Barbiturate and metabolites    200 Benzodiazepine                 200 Opiates and metabolites        300 Cocaine and metabolites        300 THC                            50 Performed at [facility], [address] [place], [place] [address]   Urinalysis, w/ Reflex to Culture (Infection Suspected) -     Status: Abnormal   Collection Time: 04/17/23 10:57 AM  Result Value Ref Range   Specimen Source URINE, CLEAN CATCH    Color, Urine AMBER (A) YELLOW    Comment: BIOCHEMICALS MAY BE AFFECTED BY COLOR   APPearance CLOUDY (A) CLEAR   Specific Gravity, Urine 1.024 1.005 - 1.030   pH 5.0 5.0 - 8.0   Glucose, UA NEGATIVE NEGATIVE mg/dL   Hgb urine dipstick NEGATIVE NEGATIVE   Bilirubin Urine SMALL (A) NEGATIVE   Ketones, ur 5 (A) NEGATIVE mg/dL   Protein, ur 191 (A) NEGATIVE mg/dL   Nitrite NEGATIVE NEGATIVE   Leukocytes,Ua LARGE (A) NEGATIVE   RBC / HPF 11-20 0 - 5 RBC/hpf   WBC, UA 21-50 0 - 5 WBC/hpf    Comment:        Reflex urine culture not performed if WBC <=10, OR if Squamous epithelial cells >5. If Squamous epithelial cells >5 suggest recollection.    Bacteria, UA RARE (A) NONE SEEN   Squamous Epithelial / HPF 11-20 0 - 5 /HPF   Mucus PRESENT    Hyaline Casts, UA PRESENT     Comment: Performed at [facility], [address], [place], [place] [address]  C Difficile Quick Screen w PCR reflex     Status: None   Collection Time: 04/17/23 11:44 AM   Specimen: STOOL   Result Value Ref Range   C Diff antigen NEGATIVE NEGATIVE   C Diff toxin NEGATIVE NEGATIVE   C Diff interpretation No C. difficile detected.     Comment: Performed at [facility], [address], [place], [place] [address]  Resp panel by RT-PCR (RSV, Flu A&B, Covid) Anterior Nasal Swab     Status: None   Collection Time: 04/17/23 11:50 AM   Specimen: Anterior Nasal Swab  Result Value Ref Range   SARS Coronavirus 2 by RT PCR NEGATIVE NEGATIVE    Comment: (NOTE) SARS-CoV-2 target nucleic acids are NOT DETECTED.  The SARS-CoV-2 RNA is generally detectable in upper respiratory specimens  during the acute phase of infection. The lowest concentration of SARS-CoV-2 viral copies this assay can detect is 138 copies/mL. A negative result does not preclude SARS-Cov-2 infection and should not be used as the sole basis for treatment or other patient management decisions. A negative result may occur with  improper specimen collection/handling, submission of specimen other than nasopharyngeal swab, presence of viral mutation(s) within the areas targeted by this assay, and inadequate number of viral copies(<138 copies/mL). A negative result must be combined with clinical observations, patient history, and epidemiological information. The expected result is Negative.  Fact Sheet for Patients:  BloggerCourse.com  Fact Sheet for Healthcare Providers:  SeriousBroker.it  This test is no t yet approved or cleared by the [place] FDA and  has been authorized for detection and/or diagnosis of SARS-CoV-2 by FDA under an Emergency Use Authorization (EUA). This EUA will remain  in effect (meaning this test can be used) for the duration of the COVID-19 declaration under Section 564(b)(1) of the Act, 21 U.S.C.section 360bbb-3(b)(1), unless the authorization is terminated  or revoked sooner.       Influenza A by PCR NEGATIVE  NEGATIVE   Influenza B by PCR NEGATIVE NEGATIVE    Comment: (NOTE) The Xpert Xpress SARS-CoV-2/FLU/RSV plus assay is intended as an aid in the diagnosis of influenza from Nasopharyngeal swab specimens and should not be used as a sole basis for treatment. Nasal washings and aspirates are unacceptable for Xpert Xpress SARS-CoV-2/FLU/RSV testing.  Fact Sheet for Patients: BloggerCourse.com  Fact Sheet for Healthcare Providers: SeriousBroker.it  This test is not yet approved or cleared by the [place] FDA and has been authorized for detection and/or diagnosis of SARS-CoV-2 by FDA under an Emergency Use Authorization (EUA). This EUA will remain in effect (meaning this test can be used) for the duration of the COVID-19 declaration under Section 564(b)(1) of the Act, 21 U.S.C. section 360bbb-3(b)(1), unless the authorization is terminated or revoked.     Resp Syncytial Virus by PCR NEGATIVE NEGATIVE    Comment: (NOTE) Fact Sheet for Patients: BloggerCourse.com  Fact Sheet for Healthcare Providers: SeriousBroker.it  This test is not yet approved or cleared by the [place] FDA and has been authorized for detection and/or diagnosis of SARS-CoV-2 by FDA under an Emergency Use Authorization (EUA). This EUA will remain in effect (meaning this test can be used) for the duration of the COVID-19 declaration under Section 564(b)(1) of the Act, 21 U.S.C. section 360bbb-3(b)(1), unless the authorization is terminated or revoked.  Performed at [facility], [address] [place], [place] [address]    DG Chest Port 1 View  Result Date: 04/17/2023 CLINICAL DATA:  Hypotension.  History of primary lung adenocarcinoma EXAM: PORTABLE CHEST 1 VIEW COMPARISON:  X-ray 07/16/2022 and older. FINDINGS: Status post median sternotomy. Fiduciary marker along the left lung base.  Mild linear changes in the left lung base. No consolidation, pneumothorax or effusion. No edema. Overlapping cardiac leads. Normal cardiopericardial silhouette. IMPRESSION: Postop chest. Left lung base fiduciary marker with some linear changes. No pneumothorax, effusion or new consolidation Electronically Signed   By: Karen Kays M.D.   On: 04/17/2023 10:31    Pending Labs Unresulted Labs (From admission, onward)     Start     Ordered   04/18/23 0500  HIV Antibody (routine testing w rflx)  (HIV Antibody (Routine testing w reflex) panel)  Tomorrow morning,   R        04/17/23 1407   04/18/23  0500  CBC  Tomorrow morning,   R        04/17/23 1407   04/18/23 0500  Comprehensive metabolic panel  Tomorrow morning,   R        04/17/23 1407   04/17/23 1139  Gastrointestinal Panel by PCR , Stool  (Gastrointestinal Panel by PCR, Stool                                                                                                                                                     **Does Not include CLOSTRIDIUM DIFFICILE testing. **If CDIFF testing is needed, place order from the "C Difficile Testing" order set.**)  Once,   URGENT        04/17/23 1138            Vitals/Pain Today's Vitals   04/17/23 1330 04/17/23 1400 04/17/23 1500 04/17/23 1530  BP: 102/72 100/79 103/83 (!) 124/97  Pulse: (!) 103 (!) 105 (!) 107 (!) 101  Resp: (!) 22 (!) 32 (!) 29 (!) 21  Temp:      TempSrc:      SpO2: 100% 99% 96% 100%  Weight:      Height:      PainSc:        Isolation Precautions No active isolations  Medications Medications  0.9 % NaCl with KCl 20 mEq/ L  infusion ( Intravenous New Bag/Given 04/17/23 1320)  enoxaparin (LOVENOX) injection 40 mg (has no administration in time range)  acetaminophen (TYLENOL) tablet 650 mg (has no administration in time range)    Or  acetaminophen (TYLENOL) suppository 650 mg (has no administration in time range)  ondansetron (ZOFRAN) tablet 4 mg (has no  administration in time range)    Or  ondansetron (ZOFRAN) injection 4 mg (has no administration in time range)  nicotine (NICODERM CQ - dosed in mg/24 hours) patch 14 mg (has no administration in time range)  pantoprazole (PROTONIX) injection 40 mg (has no administration in time range)  prochlorperazine (COMPAZINE) injection 10 mg (has no administration in time range)  fentaNYL (SUBLIMAZE) injection 25 mcg (has no administration in time range)  fentaNYL (SUBLIMAZE) injection 25 mcg (has no administration in time range)  lactated ringers bolus 500 mL (0 mLs Intravenous Stopped 04/17/23 0930)  lactated ringers bolus 500 mL (0 mLs Intravenous Stopped 04/17/23 1220)    Mobility walks     Focused Assessments     R Recommendations: See Admitting Provider Note  Report given to:   Additional Notes:

## 2023-04-17 NOTE — ED Triage Notes (Addendum)
Pt complaining of generalized cramps, and blurry vision. Cramps since Tuesday, and blurry vision since Wednesday. Pt was seen at [facility] recently, was told she had hypokalemia. Denies any new pain, pt does endorse nausea, diarrhea, fevers/chills.

## 2023-04-18 DIAGNOSIS — N179 Acute kidney failure, unspecified: Secondary | ICD-10-CM | POA: Diagnosis not present

## 2023-04-18 LAB — COMPREHENSIVE METABOLIC PANEL
ALT: 22 U/L (ref 0–44)
AST: 26 U/L (ref 15–41)
Albumin: 3.7 g/dL (ref 3.5–5.0)
Alkaline Phosphatase: 54 U/L (ref 38–126)
Anion gap: 10 (ref 5–15)
BUN: 14 mg/dL (ref 6–20)
CO2: 18 mmol/L — ABNORMAL LOW (ref 22–32)
Calcium: 9 mg/dL (ref 8.9–10.3)
Chloride: 104 mmol/L (ref 98–111)
Creatinine, Ser: 0.86 mg/dL (ref 0.44–1.00)
GFR, Estimated: >60 mL/min (ref >60)
Glucose, Bld: 118 mg/dL — ABNORMAL HIGH (ref 70–99)
Potassium: 4.5 mmol/L (ref 3.5–5.1)
Sodium: 132 mmol/L — ABNORMAL LOW (ref 135–145)
Total Bilirubin: 0.6 mg/dL (ref 0.3–1.2)
Total Protein: 7.5 g/dL (ref 6.5–8.1)

## 2023-04-18 LAB — GASTROINTESTINAL PANEL BY PCR, STOOL (REPLACES STOOL CULTURE)

## 2023-04-18 LAB — CBC
HCT: 40.8 % (ref 36.0–46.0)
Hemoglobin: 13.3 g/dL (ref 12.0–15.0)
MCH: 29.9 pg (ref 26.0–34.0)
MCHC: 32.6 g/dL (ref 30.0–36.0)
MCV: 91.7 fL (ref 80.0–100.0)
Platelets: 200 10*3/uL (ref 150–400)
RBC: 4.45 MIL/uL (ref 3.87–5.11)
RDW: 14.7 % (ref 11.5–15.5)
WBC: 7 10*3/uL (ref 4.0–10.5)
nRBC: 0 % (ref 0.0–0.2)

## 2023-04-18 LAB — HIV ANTIBODY (ROUTINE TESTING W REFLEX): HIV Screen 4th Generation wRfx: NONREACTIVE

## 2023-04-18 NOTE — Progress Notes (Signed)
   04/18/23 0952  TOC Brief Assessment  Insurance and Status Reviewed  Patient has primary care physician Yes  Home environment has been reviewed home with s/o  Prior level of function: independent  Prior/Current Home Services No current home services  Social Determinants of Health Reivew SDOH reviewed no interventions necessary  Readmission risk has been reviewed Yes  Transition of care needs no transition of care needs at this time

## 2023-04-18 NOTE — Plan of Care (Signed)
  Problem: Education: Goal: Will demonstrate proper wound care and an understanding of methods to prevent future damage Outcome: Adequate for Discharge Goal: Knowledge of disease or condition will improve Outcome: Adequate for Discharge Goal: Knowledge of the prescribed therapeutic regimen will improve Outcome: Adequate for Discharge Goal: Individualized Educational Video(s) Outcome: Adequate for Discharge   Problem: Activity: Goal: Risk for activity intolerance will decrease Outcome: Adequate for Discharge   Problem: Cardiac: Goal: Will achieve and/or maintain hemodynamic stability Outcome: Adequate for Discharge   Problem: Clinical Measurements: Goal: Postoperative complications will be avoided or minimized Outcome: Adequate for Discharge   Problem: Respiratory: Goal: Respiratory status will improve Outcome: Adequate for Discharge   Problem: Skin Integrity: Goal: Wound healing without signs and symptoms of infection Outcome: Adequate for Discharge Goal: Risk for impaired skin integrity will decrease Outcome: Adequate for Discharge   Problem: Urinary Elimination: Goal: Ability to achieve and maintain adequate renal perfusion and functioning will improve Outcome: Adequate for Discharge   Problem: Education: Goal: Understanding of CV disease, CV risk reduction, and recovery process will improve Outcome: Adequate for Discharge Goal: Individualized Educational Video(s) Outcome: Adequate for Discharge   Problem: Activity: Goal: Ability to return to baseline activity level will improve Outcome: Adequate for Discharge   Problem: Cardiovascular: Goal: Ability to achieve and maintain adequate cardiovascular perfusion will improve Outcome: Adequate for Discharge Goal: Vascular access site(s) Level 0-1 will be maintained Outcome: Adequate for Discharge   Problem: Health Behavior/Discharge Planning: Goal: Ability to safely manage health-related needs after discharge will  improve Outcome: Adequate for Discharge   Problem: Education: Goal: Knowledge of General Education information will improve Description: Including pain rating scale, medication(s)/side effects and non-pharmacologic comfort measures Outcome: Adequate for Discharge   Problem: Health Behavior/Discharge Planning: Goal: Ability to manage health-related needs will improve Outcome: Adequate for Discharge   Problem: Clinical Measurements: Goal: Ability to maintain clinical measurements within normal limits will improve Outcome: Adequate for Discharge Goal: Will remain free from infection Outcome: Adequate for Discharge Goal: Diagnostic test results will improve Outcome: Adequate for Discharge Goal: Respiratory complications will improve Outcome: Adequate for Discharge Goal: Cardiovascular complication will be avoided Outcome: Adequate for Discharge   Problem: Activity: Goal: Risk for activity intolerance will decrease Outcome: Adequate for Discharge   Problem: Nutrition: Goal: Adequate nutrition will be maintained Outcome: Adequate for Discharge   Problem: Coping: Goal: Level of anxiety will decrease Outcome: Adequate for Discharge   Problem: Elimination: Goal: Will not experience complications related to bowel motility Outcome: Adequate for Discharge Goal: Will not experience complications related to urinary retention Outcome: Adequate for Discharge   Problem: Pain Managment: Goal: General experience of comfort will improve Outcome: Adequate for Discharge   Problem: Safety: Goal: Ability to remain free from injury will improve Outcome: Adequate for Discharge   Problem: Skin Integrity: Goal: Risk for impaired skin integrity will decrease Outcome: Adequate for Discharge

## 2023-04-18 NOTE — Discharge Summary (Signed)
Physician Discharge Summary  Rachael Stone WUJ:[id number] DOB: 1965-12-08 DOA: 04/17/2023  PCP: [name], MD  Admit date: 04/17/2023 Discharge date: 04/18/2023  Admitted From: Home Disposition:  Home  Recommendations for Outpatient Follow-up:  Follow up with PCP   Discharge Condition: Stable CODE STATUS: Full  Diet recommendation: Heart healthy   Brief/Interim Summary: From H&P:  Rachael Stone is a 57 y.o. female with medical history significant of Stage IA2, cT1bN0M0 left lung lingular NSCLC treated by an following with radiation therapy, arthritis, back COPD, dilated cardiomyopathy, dysfunctional uterine bleeding, dyspnea, EtOH abuse, hypertension, but hypertensive heart disease, moderate aortic insufficiency, status post AVR, moderate mitral regurgitation, tobacco abuse who presented to the emergency department with complaints of abdominal cramping, nausea, emesis and over 20 episodes of loose stools daily for the past 3 days.  Her vision has been blurry on occasion.   No constipation, melena or hematochezia.  No flank pain, dysuria, frequency or hematuria. She denied fever, chills, rhinorrhea, sore throat, wheezing or hemoptysis.  No chest pain, palpitations, diaphoresis, PND, orthopnea or pitting edema of the lower extremities.  No polyuria, polydipsia, polyphagia or blurred vision.    Lab work: Coronavirus, influenza and RSV PCR negative.  C. difficile antigen and toxin were negative.  Urine analysis was cloudy, small bilirubin, ketones of 5 and protein of 100 mg deciliter.  Leukocyte esterase is large.  RBC 11-20, WBC 21 and rare bacteria on microscopic examination.  CBC was normal with a white count of 6.7 with a normal differential, hemoglobin 14.6 g/dL and platelets 956.  CMP showed a sodium of 134 and CO2 of 21 mmol/L with a normal anion gap.  Creatinine was 1.26 mg/deciliter, total protein 8.6 g/dL and AST 46 units/L.  The rest of the CMP measurements were normal.   Lactic acid x 2 normal.  Normal phosphorus, troponin x 2, BNP, TSH and total CK.   Imaging: Portable 1 view chest radiograph postop change.  Left lung base few scarring marker with some linear changes.  No pneumothorax, effusion or new consolidation.   ED course: Initial vital signs were temperature 97.5 F, pulse 130, respiration 15, BP 88/68 mmHg and O2 sat 100% on room air.  The patient received 500 mL of LR bolus x 2.  Patient had rapid clinical improvement with supportive measures.  Diet was advanced.  On morning of discharge, her creatinine had returned back to normal, tolerating soft diet without any issues.  GI PCR pending at time of discharge.  C. difficile was negative.   Discharge Diagnoses:   Principal Problem:   AKI (acute kidney injury) (HCC) Active Problems:   GASTROESOPHAGEAL REFLUX, NO ESOPHAGITIS   Tobacco abuse   Essential hypertension   Alcoholism (HCC)   Hypokalemia   HFimpEF (heart failure with improved EF)   Acute gastroenteritis   Discharge Instructions  Discharge Instructions     Call MD for:  difficulty breathing, headache or visual disturbances   Complete by: As directed    Call MD for:  extreme fatigue   Complete by: As directed    Call MD for:  persistant dizziness or light-headedness   Complete by: As directed    Call MD for:  persistant nausea and vomiting   Complete by: As directed    Call MD for:  severe uncontrolled pain   Complete by: As directed    Call MD for:  temperature >100.4   Complete by: As directed    Diet - low sodium heart healthy  Complete by: As directed    Discharge instructions   Complete by: As directed    You were cared for by a hospitalist during your hospital stay. If you have any questions about your discharge medications or the care you received while you were in the hospital after you are discharged, you can call the unit and ask to speak with the hospitalist on call if the hospitalist that took care of you is not  available. Once you are discharged, your primary care physician will handle any further medical issues. Please note that NO REFILLS for any discharge medications will be authorized once you are discharged, as it is imperative that you return to your primary care physician (or establish a relationship with a primary care physician if you do not have one) for your aftercare needs so that they can reassess your need for medications and monitor your lab values.   Increase activity slowly   Complete by: As directed       Allergies as of 04/18/2023       Reactions   Ibuprofen Other (See Comments)   Irritates stomach        Medication List     TAKE these medications    acetaminophen 500 MG tablet Commonly known as: TYLENOL Take 500 mg by mouth every 8 (eight) hours as needed for moderate pain.   amLODipine 5 MG tablet Commonly known as: NORVASC Take 5 mg by mouth daily.   aspirin EC 81 MG tablet Take 81 mg by mouth 2 (two) times a week. Swallow whole.   cyanocobalamin 1000 MCG tablet Commonly known as: VITAMIN B12 Take 1,000 mcg by mouth daily.   furosemide 20 MG tablet Commonly known as: LASIX Take 1 tablet (20 mg total) by mouth daily.   gabapentin 100 MG capsule Commonly known as: NEURONTIN Take 100 mg by mouth in the morning and at bedtime.   ICY HOT EX Apply 1 Application topically daily as needed (pain).   metoprolol tartrate 50 MG tablet Commonly known as: LOPRESSOR Take 1 tablet (50 mg total) by mouth 2 (two) times daily.   potassium chloride 10 MEQ tablet Commonly known as: KLOR-CON Take 2 tablets (20 mEq total) by mouth daily.   rosuvastatin 10 MG tablet Commonly known as: CRESTOR Take 1 tablet (10 mg total) by mouth at bedtime.   spironolactone 25 MG tablet Commonly known as: ALDACTONE Take 1 tablet (25 mg total) by mouth daily.        Follow-up Information     [name], MD Follow up.   Specialty: Internal Medicine Contact  information: [address] [place] [address] [phone]                Allergies  Allergen Reactions   Ibuprofen Other (See Comments)    Irritates stomach      Procedures/Studies: DG Chest Port 1 View  Result Date: 04/17/2023 CLINICAL DATA:  Hypotension.  History of primary lung adenocarcinoma EXAM: PORTABLE CHEST 1 VIEW COMPARISON:  X-ray 07/16/2022 and older. FINDINGS: Status post median sternotomy. Fiduciary marker along the left lung base. Mild linear changes in the left lung base. No consolidation, pneumothorax or effusion. No edema. Overlapping cardiac leads. Normal cardiopericardial silhouette. IMPRESSION: Postop chest. Left lung base fiduciary marker with some linear changes. No pneumothorax, effusion or new consolidation Electronically Signed   By: Karen Kays M.D.   On: 04/17/2023 10:31      Discharge Exam: Vitals:   04/18/23 0155 04/18/23  0528  BP: 90/70 115/81  Pulse: 82 76  Resp: 18 18  Temp: 99.1 F (37.3 C) 98.2 F (36.8 C)  SpO2: 100% 100%    General: Pt is alert, awake, not in acute distress Cardiovascular: RRR, S1/S2 +, no edema Respiratory: CTA bilaterally, no wheezing, no rhonchi, no respiratory distress, no conversational dyspnea  Abdominal: Soft, NT, ND, bowel sounds + Extremities: no edema, no cyanosis Psych: Normal mood and affect, stable judgement and insight     The results of significant diagnostics from this hospitalization (including imaging, microbiology, ancillary and laboratory) are listed below for reference.     Microbiology: Recent Results (from the past 240 hour(s))  C Difficile Quick Screen w PCR reflex     Status: None   Collection Time: 04/17/23 11:44 AM   Specimen: STOOL  Result Value Ref Range Status   C Diff antigen NEGATIVE NEGATIVE Final   C Diff toxin NEGATIVE NEGATIVE Final   C Diff interpretation No C. difficile detected.  Final    Comment: Performed at [facility], [address], [place], [place] [address]  Resp panel by RT-PCR (RSV, Flu A&B, Covid) Anterior Nasal Swab     Status: None   Collection Time: 04/17/23 11:50 AM   Specimen: Anterior Nasal Swab  Result Value Ref Range Status   SARS Coronavirus 2 by RT PCR NEGATIVE NEGATIVE Final    Comment: (NOTE) SARS-CoV-2 target nucleic acids are NOT DETECTED.  The SARS-CoV-2 RNA is generally detectable in upper respiratory specimens during the acute phase of infection. The lowest concentration of SARS-CoV-2 viral copies this assay can detect is 138 copies/mL. A negative result does not preclude SARS-Cov-2 infection and should not be used as the sole basis for treatment or other patient management decisions. A negative result may occur with  improper specimen collection/handling, submission of specimen other than nasopharyngeal swab, presence of viral mutation(s) within the areas targeted by this assay, and inadequate number of viral copies(<138 copies/mL). A negative result must be combined with clinical observations, patient history, and epidemiological information. The expected result is Negative.  Fact Sheet for Patients:  BloggerCourse.com  Fact Sheet for Healthcare Providers:  SeriousBroker.it  This test is no t yet approved or cleared by the [place] FDA and  has been authorized for detection and/or diagnosis of SARS-CoV-2 by FDA under an Emergency Use Authorization (EUA). This EUA will remain  in effect (meaning this test can be used) for the duration of the COVID-19 declaration under Section 564(b)(1) of the Act, 21 U.S.C.section 360bbb-3(b)(1), unless the authorization is terminated  or revoked sooner.       Influenza A by PCR NEGATIVE NEGATIVE Final   Influenza B by PCR NEGATIVE NEGATIVE Final    Comment: (NOTE) The Xpert Xpress SARS-CoV-2/FLU/RSV plus assay is intended as an aid in the diagnosis of influenza from Nasopharyngeal  swab specimens and should not be used as a sole basis for treatment. Nasal washings and aspirates are unacceptable for Xpert Xpress SARS-CoV-2/FLU/RSV testing.  Fact Sheet for Patients: BloggerCourse.com  Fact Sheet for Healthcare Providers: SeriousBroker.it  This test is not yet approved or cleared by the [place] FDA and has been authorized for detection and/or diagnosis of SARS-CoV-2 by FDA under an Emergency Use Authorization (EUA). This EUA will remain in effect (meaning this test can be used) for the duration of the COVID-19 declaration under Section 564(b)(1) of the Act, 21 U.S.C. section 360bbb-3(b)(1), unless the authorization is terminated or revoked.  Resp Syncytial Virus by PCR NEGATIVE NEGATIVE Final    Comment: (NOTE) Fact Sheet for Patients: BloggerCourse.com  Fact Sheet for Healthcare Providers: SeriousBroker.it  This test is not yet approved or cleared by the [place] FDA and has been authorized for detection and/or diagnosis of SARS-CoV-2 by FDA under an Emergency Use Authorization (EUA). This EUA will remain in effect (meaning this test can be used) for the duration of the COVID-19 declaration under Section 564(b)(1) of the Act, 21 U.S.C. section 360bbb-3(b)(1), unless the authorization is terminated or revoked.  Performed at [facility], [address], [place], [place] [address]      Labs: BNP (last 3 results) Recent Labs    07/12/22 0306 04/17/23 0845  BNP 531.1* 24.0   Basic Metabolic Panel: Recent Labs  Lab 04/14/23 0833 04/14/23 0834 04/17/23 0845 04/18/23 0438  NA 137  --  134* 132*  K 3.2*  --  3.5 4.5  CL 102  --  104 104  CO2 23  --  21* 18*  GLUCOSE 149*  --  99 118*  BUN 5*  --  10 14  CREATININE 1.02*  --  1.26* 0.86  CALCIUM 8.9  --  9.6 9.0  MG  --  1.8 1.8  --   PHOS  --   --  4.0  --     Liver Function Tests: Recent Labs  Lab 04/17/23 0845 04/18/23 0438  AST 46* 26  ALT 29 22  ALKPHOS 69 54  BILITOT 0.7 0.6  PROT 8.6* 7.5  ALBUMIN 4.3 3.7   No results for input(s): "LIPASE", "AMYLASE" in the last 168 hours. No results for input(s): "AMMONIA" in the last 168 hours. CBC: Recent Labs  Lab 04/14/23 0833 04/17/23 0845 04/18/23 0438  WBC 6.2 6.7 7.0  NEUTROABS  --  4.4  --   HGB 13.7 14.6 13.3  HCT 42.2 44.4 40.8  MCV 89.4 92.1 91.7  PLT 250 250 200   Cardiac Enzymes: Recent Labs  Lab 04/14/23 0834 04/17/23 0845  CKTOTAL 74 92   BNP: Invalid input(s): "POCBNP" CBG: No results for input(s): "GLUCAP" in the last 168 hours. D-Dimer No results for input(s): "DDIMER" in the last 72 hours. Hgb A1c No results for input(s): "HGBA1C" in the last 72 hours. Lipid Profile No results for input(s): "CHOL", "HDL", "LDLCALC", "TRIG", "CHOLHDL", "LDLDIRECT" in the last 72 hours. Thyroid function studies Recent Labs    04/17/23 0845  TSH 0.669   Anemia work up No results for input(s): "VITAMINB12", "FOLATE", "FERRITIN", "TIBC", "IRON", "RETICCTPCT" in the last 72 hours. Urinalysis    Component Value Date/Time   COLORURINE AMBER (A) 04/17/2023 1057   APPEARANCEUR CLOUDY (A) 04/17/2023 1057   LABSPEC 1.024 04/17/2023 1057   PHURINE 5.0 04/17/2023 1057   GLUCOSEU NEGATIVE 04/17/2023 1057   HGBUR NEGATIVE 04/17/2023 1057   BILIRUBINUR SMALL (A) 04/17/2023 1057   BILIRUBINUR neg 09/15/2017 1412   KETONESUR 5 (A) 04/17/2023 1057   PROTEINUR 100 (A) 04/17/2023 1057   UROBILINOGEN 0.2 01/10/2019 1328   NITRITE NEGATIVE 04/17/2023 1057   LEUKOCYTESUR LARGE (A) 04/17/2023 1057   Sepsis Labs Recent Labs  Lab 04/14/23 0833 04/17/23 0845 04/18/23 0438  WBC 6.2 6.7 7.0   Microbiology Recent Results (from the past 240 hour(s))  C Difficile Quick Screen w PCR reflex     Status: None   Collection Time: 04/17/23 11:44 AM   Specimen: STOOL  Result Value Ref  Range Status   C  Diff antigen NEGATIVE NEGATIVE Final   C Diff toxin NEGATIVE NEGATIVE Final   C Diff interpretation No C. difficile detected.  Final    Comment: Performed at [facility], [address], [place], [place] [address]  Resp panel by RT-PCR (RSV, Flu A&B, Covid) Anterior Nasal Swab     Status: None   Collection Time: 04/17/23 11:50 AM   Specimen: Anterior Nasal Swab  Result Value Ref Range Status   SARS Coronavirus 2 by RT PCR NEGATIVE NEGATIVE Final    Comment: (NOTE) SARS-CoV-2 target nucleic acids are NOT DETECTED.  The SARS-CoV-2 RNA is generally detectable in upper respiratory specimens during the acute phase of infection. The lowest concentration of SARS-CoV-2 viral copies this assay can detect is 138 copies/mL. A negative result does not preclude SARS-Cov-2 infection and should not be used as the sole basis for treatment or other patient management decisions. A negative result may occur with  improper specimen collection/handling, submission of specimen other than nasopharyngeal swab, presence of viral mutation(s) within the areas targeted by this assay, and inadequate number of viral copies(<138 copies/mL). A negative result must be combined with clinical observations, patient history, and epidemiological information. The expected result is Negative.  Fact Sheet for Patients:  BloggerCourse.com  Fact Sheet for Healthcare Providers:  SeriousBroker.it  This test is no t yet approved or cleared by the [place] FDA and  has been authorized for detection and/or diagnosis of SARS-CoV-2 by FDA under an Emergency Use Authorization (EUA). This EUA will remain  in effect (meaning this test can be used) for the duration of the COVID-19 declaration under Section 564(b)(1) of the Act, 21 U.S.C.section 360bbb-3(b)(1), unless the authorization is terminated  or revoked sooner.       Influenza A by  PCR NEGATIVE NEGATIVE Final   Influenza B by PCR NEGATIVE NEGATIVE Final    Comment: (NOTE) The Xpert Xpress SARS-CoV-2/FLU/RSV plus assay is intended as an aid in the diagnosis of influenza from Nasopharyngeal swab specimens and should not be used as a sole basis for treatment. Nasal washings and aspirates are unacceptable for Xpert Xpress SARS-CoV-2/FLU/RSV testing.  Fact Sheet for Patients: BloggerCourse.com  Fact Sheet for Healthcare Providers: SeriousBroker.it  This test is not yet approved or cleared by the [place] FDA and has been authorized for detection and/or diagnosis of SARS-CoV-2 by FDA under an Emergency Use Authorization (EUA). This EUA will remain in effect (meaning this test can be used) for the duration of the COVID-19 declaration under Section 564(b)(1) of the Act, 21 U.S.C. section 360bbb-3(b)(1), unless the authorization is terminated or revoked.     Resp Syncytial Virus by PCR NEGATIVE NEGATIVE Final    Comment: (NOTE) Fact Sheet for Patients: BloggerCourse.com  Fact Sheet for Healthcare Providers: SeriousBroker.it  This test is not yet approved or cleared by the [place] FDA and has been authorized for detection and/or diagnosis of SARS-CoV-2 by FDA under an Emergency Use Authorization (EUA). This EUA will remain in effect (meaning this test can be used) for the duration of the COVID-19 declaration under Section 564(b)(1) of the Act, 21 U.S.C. section 360bbb-3(b)(1), unless the authorization is terminated or revoked.  Performed at [facility], [address] [place], [place] [address]      Patient was seen and examined on the day of discharge and was found to be in stable condition. Time coordinating discharge: 35 minutes including assessment and coordination of care, as well as examination of the patient.  SIGNED:  Noralee Stain, DO [facility] 04/18/2023, 10:37 AM

## 2023-04-20 ENCOUNTER — Emergency Department (HOSPITAL_COMMUNITY)
Admission: EM | Admit: 2023-04-20 | Discharge: 2023-04-20 | Discharge status: Against Medical Advice | Payer: 59 | Source: Home / Self Care

## 2023-04-21 DIAGNOSIS — N179 Acute kidney failure, unspecified: Secondary | ICD-10-CM | POA: Diagnosis not present

## 2023-04-21 DIAGNOSIS — I110 Hypertensive heart disease with heart failure: Secondary | ICD-10-CM | POA: Diagnosis not present

## 2023-04-21 DIAGNOSIS — R202 Paresthesia of skin: Secondary | ICD-10-CM | POA: Diagnosis not present

## 2023-04-21 DIAGNOSIS — I5032 Chronic diastolic (congestive) heart failure: Secondary | ICD-10-CM | POA: Diagnosis not present

## 2023-04-21 DIAGNOSIS — E782 Mixed hyperlipidemia: Secondary | ICD-10-CM | POA: Diagnosis not present

## 2023-04-21 DIAGNOSIS — R252 Cramp and spasm: Secondary | ICD-10-CM | POA: Diagnosis not present

## 2023-04-21 DIAGNOSIS — J449 Chronic obstructive pulmonary disease, unspecified: Secondary | ICD-10-CM | POA: Diagnosis not present

## 2023-04-21 DIAGNOSIS — K760 Fatty (change of) liver, not elsewhere classified: Secondary | ICD-10-CM | POA: Diagnosis not present

## 2023-04-21 DIAGNOSIS — F1010 Alcohol abuse, uncomplicated: Secondary | ICD-10-CM | POA: Diagnosis not present

## 2023-04-22 ENCOUNTER — Telehealth: Payer: Self-pay | Admitting: Cardiovascular Disease

## 2023-04-22 NOTE — Telephone Encounter (Signed)
Pt c/o BP issue: STAT if pt c/o blurred vision, one-sided weakness or slurred speech  1. What are your last 5 BP readings? 78/80 yesterday  2. Are you having any other symptoms (ex. Dizziness, headache, blurred vision, passed out)? Cramping, diarrhea and lightheadedness.   3. What is your BP issue? Pt went to ED Friday and was kept until Saturday, but she's still feeling the same way.

## 2023-04-22 NOTE — Telephone Encounter (Signed)
Spoke with patient and she states her BP is 78/49, she is having dizziness, lightheadedness, diarrhea and really bad cramps. She states she went to ED Friday and saw PCP yesterday but her symptoms are not getting better they are getting worse. Did advise to hold her BP medications and she needs to return to the ED.

## 2023-04-23 DIAGNOSIS — E861 Hypovolemia: Secondary | ICD-10-CM | POA: Diagnosis not present

## 2023-04-23 DIAGNOSIS — K591 Functional diarrhea: Secondary | ICD-10-CM | POA: Diagnosis not present

## 2023-04-28 DIAGNOSIS — E782 Mixed hyperlipidemia: Secondary | ICD-10-CM | POA: Diagnosis not present

## 2023-04-28 DIAGNOSIS — N179 Acute kidney failure, unspecified: Secondary | ICD-10-CM | POA: Diagnosis not present

## 2023-05-22 ENCOUNTER — Telehealth: Payer: Self-pay

## 2023-05-22 DIAGNOSIS — E782 Mixed hyperlipidemia: Secondary | ICD-10-CM | POA: Diagnosis not present

## 2023-05-22 DIAGNOSIS — N179 Acute kidney failure, unspecified: Secondary | ICD-10-CM | POA: Diagnosis not present

## 2023-05-22 NOTE — Telephone Encounter (Signed)
Transition Care Management Unsuccessful Follow-up Telephone Call  Date of discharge and from where:  Rachael Stone 10/7  Attempts:  1st Attempt  Reason for unsuccessful TCM follow-up call:  No answer/busy   Rachael Stone  [facility], [facility] Guide, Phone: [phone] Website: [email or web address]

## 2023-05-22 NOTE — Telephone Encounter (Signed)
Transition Care Management Unsuccessful Follow-up Telephone Call  Date of discharge and from where:  [facility] 10/7  Attempts:  2nd Attempt  Reason for unsuccessful TCM follow-up call:  No answer/busy   Rachael Stone [place]  [facility], [facility] Guide, Phone: [phone] Website: [email or web address]

## 2023-05-26 DIAGNOSIS — E876 Hypokalemia: Secondary | ICD-10-CM | POA: Diagnosis not present

## 2023-05-26 DIAGNOSIS — E782 Mixed hyperlipidemia: Secondary | ICD-10-CM | POA: Diagnosis not present

## 2023-05-26 DIAGNOSIS — J449 Chronic obstructive pulmonary disease, unspecified: Secondary | ICD-10-CM | POA: Diagnosis not present

## 2023-05-26 DIAGNOSIS — I110 Hypertensive heart disease with heart failure: Secondary | ICD-10-CM | POA: Diagnosis not present

## 2023-05-26 DIAGNOSIS — K760 Fatty (change of) liver, not elsewhere classified: Secondary | ICD-10-CM | POA: Diagnosis not present

## 2023-05-26 DIAGNOSIS — I5032 Chronic diastolic (congestive) heart failure: Secondary | ICD-10-CM | POA: Diagnosis not present

## 2023-07-10 ENCOUNTER — Telehealth: Payer: Self-pay | Admitting: *Deleted

## 2023-07-10 NOTE — Telephone Encounter (Signed)
Called patient to inform of CT appt. for 07-16-23- arrival time- 7:45 am @ [facility], no restrictions to scan, patient to receive results from Laurence Aly via telephone on 07-20-23 @ 1 pm, spoke with patient and she is aware of these appts. and the instructions

## 2023-07-16 ENCOUNTER — Ambulatory Visit (HOSPITAL_COMMUNITY): Admission: RE | Admit: 2023-07-16 | Payer: 59 | Source: Ambulatory Visit

## 2023-07-17 ENCOUNTER — Telehealth: Payer: Self-pay | Admitting: *Deleted

## 2023-07-17 NOTE — Telephone Encounter (Signed)
 CALLED PATIENT TO ASK ABOUT RESCHEDULING MISSED CT AND TELEPHONE FU, SPOKE WITH PATIENT AND SHE AGREED TO DO CT AND TELEPHONE FU, CT ARRANGED FOR 07-23-23- ARRIVAL TIME- 7:45 AM @ [facility], NO RESTRICTIONS TO SCAN, AND PATIENT TO BE CALLED WITH RESULTS ON 08-10-23 @  3 PM BY ALISON PERKINS, SPOKE WITH PATIENT AND SHE AGREED TO SCAN AND TELEPHONE FU, SHE IS AWARE OF THE INSTRUCTIONS

## 2023-07-20 ENCOUNTER — Ambulatory Visit: Payer: 59 | Admitting: Radiation Oncology

## 2023-07-23 ENCOUNTER — Ambulatory Visit (HOSPITAL_COMMUNITY)
Admission: RE | Admit: 2023-07-23 | Discharge: 2023-07-23 | Disposition: A | Discharge status: Home / Self Care | Payer: 59 | Source: Ambulatory Visit | Attending: Radiation Oncology | Admitting: Radiation Oncology

## 2023-07-23 DIAGNOSIS — C3412 Malignant neoplasm of upper lobe, left bronchus or lung: Secondary | ICD-10-CM | POA: Insufficient documentation

## 2023-07-23 MED ORDER — IOHEXOL 300 MG/ML  SOLN
75.0000 mL | Freq: Once | INTRAMUSCULAR | Status: AC | PRN
  Administered 2023-07-23: 75 mL via INTRAVENOUS

## 2023-07-27 LAB — POCT I-STAT CREATININE: Creatinine, Ser: 0.9 mg/dL (ref 0.44–1.00)

## 2023-08-03 ENCOUNTER — Ambulatory Visit: Payer: 59 | Admitting: Radiation Oncology

## 2023-08-03 ENCOUNTER — Telehealth: Payer: Self-pay | Admitting: Radiation Oncology

## 2023-08-03 DIAGNOSIS — C3412 Malignant neoplasm of upper lobe, left bronchus or lung: Secondary | ICD-10-CM

## 2023-08-03 NOTE — Telephone Encounter (Signed)
I called to let the patient know the results from her recent CT scan.  We discussed continuing to follow the nodule that was treated but at this time what is described as increased consolidative change appears to be related to the outer limits of her radiation treatment fields consistent with the ablative technique of her treatment.  She is encouraged to call sooner but we would recommend repeat imaging in 6 months time.

## 2023-08-10 ENCOUNTER — Ambulatory Visit: Payer: Self-pay | Admitting: Radiation Oncology

## 2024-01-09 ENCOUNTER — Other Ambulatory Visit: Payer: Self-pay | Admitting: Cardiovascular Disease

## 2024-01-09 DIAGNOSIS — Z952 Presence of prosthetic heart valve: Secondary | ICD-10-CM

## 2024-01-09 DIAGNOSIS — R000 Tachycardia, unspecified: Secondary | ICD-10-CM

## 2024-01-28 NOTE — Progress Notes (Signed)
 Telephone nursing appointment for review of most recent CT-Chest results. I verified patient's identity x2 and began nursing interview.   Patient states issues as follows...  -Pain: Denies -Fatigue: Denies -Skin: Denies -Lungs: Denies -Appetite: Good   Patient denies any other related issues at this time.   Meaningful use complete.   Patient aware of their telephone appointment w/ Donald Husband PA-C. I left my extension [phone] in case patient needs anything. Patient verbalized understanding. This concludes the nursing interview.   Patient preferred phone # [phone]   Rosaline Minerva, LPN

## 2024-02-01 ENCOUNTER — Other Ambulatory Visit: Payer: Self-pay | Admitting: Radiation Oncology

## 2024-02-01 ENCOUNTER — Telehealth: Payer: Self-pay | Admitting: *Deleted

## 2024-02-01 DIAGNOSIS — C3412 Malignant neoplasm of upper lobe, left bronchus or lung: Secondary | ICD-10-CM

## 2024-02-01 NOTE — Telephone Encounter (Signed)
 CALLED PATIENT TO INFORM OF CT FOR 02-02-24- ARRIVAL TIME- 1:45 PM @ [facility], NO RESTRICTIONS TO SCAN, PATIENT TO RECEIVE RESULTS FROM ALISON PERKINS ON 02/08/24 @ 1 PM VIA TELEPHONE, SPOKE WITH PATIENT AND SHE IS AWARE OF THESE APPTS. AND THE INSTRUCTIONS

## 2024-02-02 ENCOUNTER — Ambulatory Visit (HOSPITAL_COMMUNITY)
Admission: RE | Admit: 2024-02-02 | Discharge: 2024-02-02 | Disposition: A | Discharge status: Home / Self Care | Source: Ambulatory Visit | Attending: Radiation Oncology | Admitting: Radiation Oncology

## 2024-02-02 ENCOUNTER — Encounter (HOSPITAL_COMMUNITY): Payer: Self-pay

## 2024-02-02 DIAGNOSIS — C3412 Malignant neoplasm of upper lobe, left bronchus or lung: Secondary | ICD-10-CM | POA: Diagnosis present

## 2024-02-02 LAB — POCT I-STAT CREATININE: Creatinine, Ser: 0.9 mg/dL (ref 0.44–1.00)

## 2024-02-02 MED ORDER — IOHEXOL 300 MG/ML  SOLN
75.0000 mL | Freq: Once | INTRAMUSCULAR | Status: AC | PRN
  Administered 2024-02-02: 75 mL via INTRAVENOUS

## 2024-02-05 ENCOUNTER — Encounter: Payer: Self-pay | Admitting: Radiation Oncology

## 2024-02-08 ENCOUNTER — Ambulatory Visit
Admission: RE | Admit: 2024-02-08 | Discharge: 2024-02-08 | Disposition: A | Discharge status: Home / Self Care | Payer: Medicaid Other | Source: Ambulatory Visit | Attending: Radiation Oncology | Admitting: Radiation Oncology

## 2024-02-08 VITALS — Ht 65.5 in | Wt 148.0 lb

## 2024-02-08 DIAGNOSIS — C3412 Malignant neoplasm of upper lobe, left bronchus or lung: Secondary | ICD-10-CM

## 2024-02-08 NOTE — Progress Notes (Signed)
 Radiation Oncology         [phone] ________________________________  Name: Rachael Stone        MRN: [id number]  Date of Service: 02/08/2024 DOB: 1965-12-15  RR:[id number], Zachary, MD  Shyrl Linnie KIDD, MD     REFERRING PHYSICIAN: Shyrl Linnie KIDD, MD   DIAGNOSIS: The encounter diagnosis was Malignant neoplasm of upper lobe of left lung (HCC).   HISTORY OF PRESENT ILLNESS: Rachael Stone is a 58 y.o. female with a history of early stage lung cancer.  The patient was found to have a nodule in the lingula of the left upper lobe in August 2023 and was referred to pulmonary medicine.  Her scan at that time had notations that she had been experiencing a 30 pound weight loss and cough.  In comparison apparently this nodule had been seen on imaging in the ER the year before.  The lesion measured 1.5 cm and was concerning for suspicious features to indicate a malignant process.  There was also a bilobed nodule versus 2 adjacent nodules in the left upper lobe posterior apex measuring 13 mm and stable since her scan in 2017.  She was counseled on PET imaging which was performed on 04/10/2022; the lingular nodule was not significantly hypermetabolic but overall enlargement had been seen and felt to still remain concerning.  She underwent a bronchoscopy on 04/15/2022 and cytology from the lingula fine-needle aspirate target 1 was consistent with adenocarcinoma a second target fine-needle aspirate was negative.  She was referred to cardiothoracic surgery and met with Dr. Shyrl.  Given her other underlying cardiac disease including aortic valvular disease she saw cardiology and underwent echocardiogram and coronary angiography in October 2022.  She had significant valvular disease and underwent aortic valve replacement on 07/09/2022.  This was with Dr. Lightfoot as well.  A repeat PET scan on 10/08/22 showed the known lesion in the lingula measuring 1.8 cm with an SUV of 1.7, and a solid LUL  nodule measuring 8 mm was stable and did not show any hypermetabolism. She received SBRT to the LUL at the level of the lingula which she tolerated well and completed on 11/14/22.  She has been followed closely with posttreatment imaging which has been reassuring.  Her most recent scan in January 2025 had measured the lingular area with evolving postradiation fibrosis up to 2.1 cm, and Dr. Dewey had reviewed her treatment plan and felt that this was consistent with her field of treatment.  She had a repeat scan on 02/02/2024 that showed persistent post radiation architectural changes in the lingula of the LUL measuring 2.2 cm. She has a stable nodule in the posterior left upper lobe that has been unchanged and measures 1.3 cm, this has been that measurement since 2017.  A new right lower lobe nodule measured 4 mm in the superior segment of the lobe that was felt to be nonspecific.  No adenopathy or new pulmonary findings were appreciated.  She continues to have evidence of steatosis of the liver, coronary artery disease, and emphysema. She's contacted to review these results.   PREVIOUS RADIATION THERAPY:   SBRT Treatment: First Treatment Date: 2022-11-03 - Last Treatment Date: 2022-11-14 Plan Name: Lung_L_UHRT Site: LUL Lingula Technique: IMRT Mode: Photon Dose Per Fraction: 6 Gy Prescribed Dose (Delivered / Prescribed): 60 Gy / 60 Gy Prescribed Fxs (Delivered / Prescribed): 10 / 10   PAST MEDICAL HISTORY:  Past Medical History:  Diagnosis Date   Adenocarcinoma of left lung (HCC) 04/15/2022  Lingular   Arthritis    Back pain    Cardiomyopathy (HCC)    a. 04/08/2016 Echo: EF 35%, diff HK, mild LVH, Gr2 DD, mod AI, mod to sev MR, mildly dil LA.   COPD (chronic obstructive pulmonary disease) (HCC)    DEPRESSIVE DISORDER NOT ELSEWHERE CLASSIFIED 12/14/2008   no current problem per patient on 04/14/22.  Qualifier: Diagnosis of  By: Jonelle MD, [name]   DYSFUNCTIONAL UTERINE BLEEDING 11/12/2007    Qualifier: Diagnosis of  By: Jonelle MD, [name]     Dyspnea    ETOH abuse    Hypertension    Hypertensive heart disease with heart failure (HCC)    Marijuana abuse    Last use 04/14/22   MENORRHAGIA 12/14/2008   Qualifier: Diagnosis of  By: Jonelle MD, [name]     Moderate aortic insufficiency    a. 04/08/2016 Echo: mod AI.   Moderate to Severe Mitral Regurgitation    a. 04/08/2016 Echo: mod-sev MR directed centrally.   Tobacco abuse        PAST SURGICAL HISTORY: Past Surgical History:  Procedure Laterality Date   AORTIC VALVE REPLACEMENT N/A 07/09/2022   Procedure: AORTIC VALVE REPLACEMENT (AVR) USING INSPIRIS RESILIA AORTIC VALVE;  Surgeon: Shyrl Linnie KIDD, MD;  Location: [facility] OR;  Service: Open Heart Surgery;  Laterality: N/A;   BRONCHIAL BIOPSY  04/15/2022   Procedure: BRONCHIAL BIOPSIES;  Surgeon: Brenna Adine CROME, DO;  Location: [facility] ENDOSCOPY;  Service: Pulmonary;;   BRONCHIAL NEEDLE ASPIRATION BIOPSY  04/15/2022   Procedure: BRONCHIAL NEEDLE ASPIRATION BIOPSIES;  Surgeon: Brenna Adine CROME, DO;  Location: [facility] ENDOSCOPY;  Service: Pulmonary;;   BUBBLE STUDY  05/12/2022   Procedure: BUBBLE STUDY;  Surgeon: Barbaraann Darryle Ned, MD;  Location[facility] ENDOSCOPY;  Service: Cardiovascular;;   CARDIAC CATHETERIZATION N/A 04/10/2016   Procedure: Right/Left Heart Cath and Coronary Angiography;  Surgeon: Candyce GORMAN Reek, MD;  Location: [facility] INVASIVE CV LAB;  Service: Cardiovascular;  Laterality: N/A;   EXPLORATORY LAPAROTOMY  07/14/2001   Ectopic Pregnancy - unsure which side or if tube was removed   RIGHT/LEFT HEART CATH AND CORONARY ANGIOGRAPHY N/A 05/12/2022   Procedure: RIGHT/LEFT HEART CATH AND CORONARY ANGIOGRAPHY;  Surgeon: Wonda Sharper, MD;  Location: [facility] INVASIVE CV LAB;  Service: Cardiovascular;  Laterality: N/A;   TEE WITHOUT CARDIOVERSION N/A 06/26/2017   Procedure: TRANSESOPHAGEAL ECHOCARDIOGRAM (TEE);  Surgeon: Alveta Aleene PARAS, MD;  Location: [facility] ENDOSCOPY;  Service:  Cardiovascular;  Laterality: N/A;   TEE WITHOUT CARDIOVERSION N/A 05/12/2022   Procedure: TRANSESOPHAGEAL ECHOCARDIOGRAM (TEE);  Surgeon: Barbaraann Darryle Ned, MD;  Location: [facility] ENDOSCOPY;  Service: Cardiovascular;  Laterality: N/A;   TEE WITHOUT CARDIOVERSION N/A 07/09/2022   Procedure: TRANSESOPHAGEAL ECHOCARDIOGRAM (TEE);  Surgeon: Shyrl Linnie KIDD, MD;  Location: [facility] OR;  Service: Open Heart Surgery;  Laterality: N/A;   TONSILLECTOMY     TOOTH EXTRACTION N/A 06/26/2022   Procedure: DENTAL RESTORATION/EXTRACTIONS;  Surgeon: Celena Lum NOVAK, DMD;  Location: [facility] OR;  Service: Dentistry;  Laterality: N/A;   TUBAL LIGATION       FAMILY HISTORY:  Family History  Problem Relation Age of Onset   CAD Mother        First MI @ 9 - 8 total.   Cirrhosis Father        alcoholic - died in his 97's.   Lupus Sister    Colon cancer Neg Hx    Colon polyps Neg Hx      SOCIAL HISTORY:  reports that she  has been smoking cigarettes. She has a 8.5 pack-year smoking history. She has never used smokeless tobacco. She reports current alcohol use of about 6.0 standard drinks of alcohol per week. She reports current drug use. Drug: Marijuana. The patient is single and lives in [place]. She has struggled with her alcohol use and has been out of work since her valve surgery.    ALLERGIES: Ibuprofen    MEDICATIONS:  Current Outpatient Medications  Medication Sig Dispense Refill   Menthol, Topical Analgesic, (ICY HOT EX) Apply 1 Application topically daily as needed (pain).     potassium chloride  (KLOR-CON ) 10 MEQ tablet Take 2 tablets (20 mEq total) by mouth daily.     spironolactone  (ALDACTONE ) 25 MG tablet Take 1 tablet (25 mg total) by mouth daily. 90 tablet 3   acetaminophen  (TYLENOL ) 500 MG tablet Take 500 mg by mouth every 8 (eight) hours as needed for moderate pain.     amLODipine  (NORVASC ) 5 MG tablet Take 5 mg by mouth daily.     aspirin  EC 81 MG tablet Take 81 mg by mouth 2 (two) times a  week. Swallow whole.     cyanocobalamin  (VITAMIN B12) 1000 MCG tablet Take 1,000 mcg by mouth daily. (Patient not taking: Reported on 02/05/2024)     furosemide  (LASIX ) 20 MG tablet Take 1 tablet (20 mg total) by mouth daily. (Patient not taking: Reported on 02/05/2024) 90 tablet 3   gabapentin  (NEURONTIN ) 100 MG capsule Take 100 mg by mouth in the morning and at bedtime. (Patient not taking: Reported on 02/05/2024)     metoprolol  tartrate (LOPRESSOR ) 50 MG tablet TAKE 1 TABLET BY MOUTH TWICE A DAY (Patient not taking: Reported on 02/05/2024) 60 tablet 0   rosuvastatin  (CRESTOR ) 10 MG tablet Take 1 tablet (10 mg total) by mouth at bedtime. (Patient not taking: Reported on 02/05/2024) 90 tablet 2   No current facility-administered medications for this encounter.     REVIEW OF SYSTEMS: On review of systems, the patient reports she is doing well overall. She is not having any chest pain or shortness of breath, fevers, or recent infections. No other complaints are verbalized.      PHYSICAL EXAM:  Unable to determine due to encounter type.   ECOG = 0  0 - Asymptomatic (Fully active, able to carry on all predisease activities without restriction)  1 - Symptomatic but completely ambulatory (Restricted in physically strenuous activity but ambulatory and able to carry out work of a light or sedentary nature. For example, light housework, office work)  2 - Symptomatic, <50% in bed during the day (Ambulatory and capable of all self care but unable to carry out any work activities. Up and about more than 50% of waking hours)  3 - Symptomatic, >50% in bed, but not bedbound (Capable of only limited self-care, confined to bed or chair 50% or more of waking hours)  4 - Bedbound (Completely disabled. Cannot carry on any self-care. Totally confined to bed or chair)  5 - Death   Raylene MM, Creech RH, Tormey DC, et al. [phone]). Toxicity and response criteria of the [facility]. Am. DOROTHA Bridges. Oncol. 5 (6): 649-55    LABORATORY DATA:  Lab Results  Component Value Date   WBC 7.0 04/18/2023   HGB 13.3 04/18/2023   HCT 40.8 04/18/2023   MCV 91.7 04/18/2023   PLT 200 04/18/2023   Lab Results  Component Value Date   NA 132 (L) 04/18/2023   K 4.5  04/18/2023   CL 104 04/18/2023   CO2 18 (L) 04/18/2023   Lab Results  Component Value Date   ALT 22 04/18/2023   AST 26 04/18/2023   ALKPHOS 54 04/18/2023   BILITOT 0.6 04/18/2023      RADIOGRAPHY: CT CHEST W CONTRAST Result Date: 02/03/2024 CLINICAL DATA:  Non-small cell lung cancer restaging, status post SBRT * Tracking Code: BO * EXAM: CT CHEST WITH CONTRAST TECHNIQUE: Multidetector CT imaging of the chest was performed during intravenous contrast administration. RADIATION DOSE REDUCTION: This exam was performed according to the departmental dose-optimization program which includes automated exposure control, adjustment of the mA and/or kV according to patient size and/or use of iterative reconstruction technique. CONTRAST:  75mL OMNIPAQUE  IOHEXOL  300 MG/ML  SOLN COMPARISON:  07/23/2023 FINDINGS: Cardiovascular: Aortic atherosclerosis. Aortic valve prosthesis. Normal heart size. Three-vessel coronary artery calcifications. No pericardial effusion. Mediastinum/Nodes: No enlarged mediastinal, hilar, or axillary lymph nodes. Thyroid  gland, trachea, and esophagus demonstrate no significant findings. Lungs/Pleura: Moderate centrilobular and paraseptal emphysema. Unchanged lobulated nodule of the posterior left upper lobe measuring 1.3 x 0.6 cm (series 7, image 29). New nodule in the superior segment right lower lobe measuring 0.4 cm (series 7, image 52). Interval increase in size and conspicuity of spiculated nodular opacity in the lingula, on today's examination measuring 2.2 x 1.8 cm, previously 1.2 x 1.1 cm when measured similarly (series 7, image 90). No pleural effusion or pneumothorax. Upper Abdomen: No acute abnormality.   Hepatic steatosis. Musculoskeletal: No chest wall abnormality. No acute osseous findings. IMPRESSION: 1. Interval increase in size and conspicuity of spiculated nodular opacity in the lingula, on today's examination measuring 2.2 x 1.8 cm, previously 1.2 x 1.1 cm when measured similarly. This could reflect evolution of radiation pneumonitis/fibrosis but is worrisome for residual or recurrent malignancy. Consider PET-CT for metabolic characterization. 2. New nodule in the superior segment right lower lobe measuring 0.4 cm, nonspecific although given findings above worrisome for a small pulmonary metastasis. 3. Unchanged lobulated nodule of the posterior left upper lobe measuring 1.3 x 0.6 cm, generally more benign in appearance and unchanged compared to prior examinations. 4. Emphysema. 5. Coronary artery disease. 6. Hepatic steatosis. Aortic Atherosclerosis (ICD10-I70.0) and Emphysema (ICD10-J43.9). Electronically Signed   By: Marolyn JONETTA Jaksch M.D.   On: 02/03/2024 06:42       IMPRESSION/PLAN: 1. Stage IA2, cT1bN0M0, NSCLC, adenocarcinoma of the LUL. The patient is doing well and her imaging was reviewed. The LUL nodule in the apex continues to be followed and appears benign as it's been unchanged since 2017. The lingular LUL nodule measurement remains similar in size. I discussed repeating her next CT in 3-4 months to follow up on the lingular region and to follow a new RLL nodule.  2. RLL nodule. This is new, subcentimeter, and will be followed closely, but if this progressed could be considered for SBRT.  2. History of cardiac disease.  She will continue to follow-up with cardiology.     Donald KYM Husband, [facility]   **Disclaimer: This note was dictated with voice recognition software. Similar sounding words can inadvertently be transcribed and this note may contain transcription errors which may not have been corrected upon publication of note.**

## 2024-02-10 ENCOUNTER — Emergency Department (HOSPITAL_COMMUNITY)

## 2024-02-10 ENCOUNTER — Encounter (HOSPITAL_COMMUNITY): Payer: Self-pay | Admitting: Emergency Medicine

## 2024-02-10 ENCOUNTER — Inpatient Hospital Stay (HOSPITAL_COMMUNITY)
Admission: EM | Admit: 2024-02-10 | Discharge: 2024-02-13 | DRG: 641 | Disposition: A | Discharge status: Home / Self Care | Attending: Internal Medicine | Admitting: Internal Medicine

## 2024-02-10 ENCOUNTER — Other Ambulatory Visit: Payer: Self-pay

## 2024-02-10 DIAGNOSIS — K760 Fatty (change of) liver, not elsewhere classified: Secondary | ICD-10-CM | POA: Diagnosis present

## 2024-02-10 DIAGNOSIS — E876 Hypokalemia: Secondary | ICD-10-CM | POA: Diagnosis present

## 2024-02-10 DIAGNOSIS — I5032 Chronic diastolic (congestive) heart failure: Secondary | ICD-10-CM | POA: Diagnosis present

## 2024-02-10 DIAGNOSIS — I700 Atherosclerosis of aorta: Secondary | ICD-10-CM | POA: Diagnosis present

## 2024-02-10 DIAGNOSIS — Z79899 Other long term (current) drug therapy: Secondary | ICD-10-CM | POA: Diagnosis not present

## 2024-02-10 DIAGNOSIS — Z952 Presence of prosthetic heart valve: Secondary | ICD-10-CM | POA: Diagnosis not present

## 2024-02-10 DIAGNOSIS — Z888 Allergy status to other drugs, medicaments and biological substances status: Secondary | ICD-10-CM

## 2024-02-10 DIAGNOSIS — Z8249 Family history of ischemic heart disease and other diseases of the circulatory system: Secondary | ICD-10-CM | POA: Diagnosis not present

## 2024-02-10 DIAGNOSIS — Z1152 Encounter for screening for COVID-19: Secondary | ICD-10-CM

## 2024-02-10 DIAGNOSIS — A084 Viral intestinal infection, unspecified: Secondary | ICD-10-CM | POA: Diagnosis present

## 2024-02-10 DIAGNOSIS — F1010 Alcohol abuse, uncomplicated: Secondary | ICD-10-CM | POA: Diagnosis present

## 2024-02-10 DIAGNOSIS — K0530 Chronic periodontitis, unspecified: Secondary | ICD-10-CM | POA: Diagnosis present

## 2024-02-10 DIAGNOSIS — Z7982 Long term (current) use of aspirin: Secondary | ICD-10-CM | POA: Diagnosis not present

## 2024-02-10 DIAGNOSIS — I110 Hypertensive heart disease with heart failure: Secondary | ICD-10-CM | POA: Diagnosis present

## 2024-02-10 DIAGNOSIS — I428 Other cardiomyopathies: Secondary | ICD-10-CM | POA: Diagnosis present

## 2024-02-10 DIAGNOSIS — Z8269 Family history of other diseases of the musculoskeletal system and connective tissue: Secondary | ICD-10-CM

## 2024-02-10 DIAGNOSIS — R9431 Abnormal electrocardiogram [ECG] [EKG]: Secondary | ICD-10-CM | POA: Diagnosis present

## 2024-02-10 DIAGNOSIS — J449 Chronic obstructive pulmonary disease, unspecified: Secondary | ICD-10-CM | POA: Diagnosis present

## 2024-02-10 DIAGNOSIS — Z716 Tobacco abuse counseling: Secondary | ICD-10-CM

## 2024-02-10 DIAGNOSIS — I351 Nonrheumatic aortic (valve) insufficiency: Secondary | ICD-10-CM | POA: Diagnosis present

## 2024-02-10 DIAGNOSIS — K529 Noninfective gastroenteritis and colitis, unspecified: Secondary | ICD-10-CM

## 2024-02-10 DIAGNOSIS — I340 Nonrheumatic mitral (valve) insufficiency: Secondary | ICD-10-CM | POA: Diagnosis present

## 2024-02-10 DIAGNOSIS — I420 Dilated cardiomyopathy: Secondary | ICD-10-CM | POA: Diagnosis present

## 2024-02-10 DIAGNOSIS — Z85118 Personal history of other malignant neoplasm of bronchus and lung: Secondary | ICD-10-CM | POA: Diagnosis not present

## 2024-02-10 DIAGNOSIS — K08109 Complete loss of teeth, unspecified cause, unspecified class: Secondary | ICD-10-CM | POA: Diagnosis present

## 2024-02-10 DIAGNOSIS — F17210 Nicotine dependence, cigarettes, uncomplicated: Secondary | ICD-10-CM | POA: Diagnosis present

## 2024-02-10 DIAGNOSIS — K000 Anodontia: Secondary | ICD-10-CM | POA: Diagnosis present

## 2024-02-10 DIAGNOSIS — I10 Essential (primary) hypertension: Secondary | ICD-10-CM | POA: Diagnosis not present

## 2024-02-10 DIAGNOSIS — F1290 Cannabis use, unspecified, uncomplicated: Secondary | ICD-10-CM | POA: Diagnosis present

## 2024-02-10 DIAGNOSIS — F17200 Nicotine dependence, unspecified, uncomplicated: Secondary | ICD-10-CM | POA: Diagnosis present

## 2024-02-10 LAB — COMPREHENSIVE METABOLIC PANEL WITH GFR
ALT: 29 U/L (ref 0–44)
AST: 46 U/L — ABNORMAL HIGH (ref 15–41)
Albumin: 3.9 g/dL (ref 3.5–5.0)
Alkaline Phosphatase: 64 U/L (ref 38–126)
Anion gap: 14 (ref 5–15)
BUN: 5 mg/dL — ABNORMAL LOW (ref 6–20)
CO2: 28 mmol/L (ref 22–32)
Calcium: 9.6 mg/dL (ref 8.9–10.3)
Chloride: 95 mmol/L — ABNORMAL LOW (ref 98–111)
Creatinine, Ser: 0.64 mg/dL (ref 0.44–1.00)
GFR, Estimated: >60 mL/min (ref >60)
Glucose, Bld: 134 mg/dL — ABNORMAL HIGH (ref 70–99)
Potassium: 2.1 mmol/L — CL (ref 3.5–5.1)
Sodium: 137 mmol/L (ref 135–145)
Total Bilirubin: 0.9 mg/dL (ref 0.0–1.2)
Total Protein: 8.2 g/dL — ABNORMAL HIGH (ref 6.5–8.1)

## 2024-02-10 LAB — URINALYSIS, ROUTINE W REFLEX MICROSCOPIC
Bacteria, UA: NONE SEEN
Bilirubin Urine: NEGATIVE
Glucose, UA: NEGATIVE mg/dL
Hgb urine dipstick: NEGATIVE
Ketones, ur: 20 mg/dL — AB
Nitrite: NEGATIVE
Protein, ur: NEGATIVE mg/dL
Specific Gravity, Urine: 1.013 (ref 1.005–1.030)
pH: 6 (ref 5.0–8.0)

## 2024-02-10 LAB — CBC
HCT: 45.5 % (ref 36.0–46.0)
HCT: 39.8 % (ref 36.0–46.0)
Hemoglobin: 15.5 g/dL — ABNORMAL HIGH (ref 12.0–15.0)
Hemoglobin: 13.8 g/dL (ref 12.0–15.0)
MCH: 31.6 pg (ref 26.0–34.0)
MCH: 31.9 pg (ref 26.0–34.0)
MCHC: 34.1 g/dL (ref 30.0–36.0)
MCHC: 34.7 g/dL (ref 30.0–36.0)
MCV: 92.9 fL (ref 80.0–100.0)
MCV: 92.1 fL (ref 80.0–100.0)
Platelets: 260 K/uL (ref 150–400)
Platelets: 228 K/uL (ref 150–400)
RBC: 4.9 MIL/uL (ref 3.87–5.11)
RBC: 4.32 MIL/uL (ref 3.87–5.11)
RDW: 16.6 % — ABNORMAL HIGH (ref 11.5–15.5)
RDW: 16.2 % — ABNORMAL HIGH (ref 11.5–15.5)
WBC: 9.1 K/uL (ref 4.0–10.5)
WBC: 10.7 K/uL — ABNORMAL HIGH (ref 4.0–10.5)
nRBC: 0 % (ref 0.0–0.2)
nRBC: 0 % (ref 0.0–0.2)

## 2024-02-10 LAB — CREATININE, SERUM
Creatinine, Ser: 0.59 mg/dL (ref 0.44–1.00)
GFR, Estimated: >60 mL/min (ref >60)

## 2024-02-10 LAB — LIPASE, BLOOD: Lipase: 25 U/L (ref 11–51)

## 2024-02-10 LAB — RESP PANEL BY RT-PCR (RSV, FLU A&B, COVID)  RVPGX2
Influenza A by PCR: NEGATIVE
Influenza B by PCR: NEGATIVE
Resp Syncytial Virus by PCR: NEGATIVE
SARS Coronavirus 2 by RT PCR: NEGATIVE

## 2024-02-10 LAB — MAGNESIUM: Magnesium: 2 mg/dL (ref 1.7–2.4)

## 2024-02-10 MED ORDER — ALBUTEROL SULFATE (2.5 MG/3ML) 0.083% IN NEBU: 3.0000 mL | INHALATION_SOLUTION | RESPIRATORY_TRACT | Status: DC | PRN

## 2024-02-10 MED ORDER — ENOXAPARIN SODIUM 40 MG/0.4ML IJ SOSY
40.0000 mg | PREFILLED_SYRINGE | INTRAMUSCULAR | Status: DC
  Administered 2024-02-10 – 2024-02-12 (×3): 40 mg via SUBCUTANEOUS
  Filled 2024-02-10 (×4): qty 0.4

## 2024-02-10 MED ORDER — SPIRONOLACTONE 25 MG PO TABS: 25.0000 mg | ORAL_TABLET | Freq: Every day | ORAL | Status: DC

## 2024-02-10 MED ORDER — ONDANSETRON HCL 4 MG/2ML IJ SOLN: 4.0000 mg | Freq: Four times a day (QID) | INTRAMUSCULAR | Status: DC | PRN

## 2024-02-10 MED ORDER — ASPIRIN 81 MG PO TBEC
81.0000 mg | DELAYED_RELEASE_TABLET | Freq: Every day | ORAL | Status: DC
  Administered 2024-02-10 – 2024-02-13 (×4): 81 mg via ORAL
  Filled 2024-02-10 (×4): qty 1

## 2024-02-10 MED ORDER — HYDROCODONE-ACETAMINOPHEN 5-325 MG PO TABS
1.0000 | ORAL_TABLET | ORAL | Status: DC | PRN
  Filled 2024-02-10: qty 2

## 2024-02-10 MED ORDER — MAGNESIUM SULFATE 2 GM/50ML IV SOLN
2.0000 g | Freq: Once | INTRAVENOUS | Status: AC
  Administered 2024-02-10: 2 g via INTRAVENOUS
  Filled 2024-02-10: qty 50

## 2024-02-10 MED ORDER — FAMOTIDINE IN NACL 20-0.9 MG/50ML-% IV SOLN
20.0000 mg | Freq: Once | INTRAVENOUS | Status: AC
  Administered 2024-02-10: 20 mg via INTRAVENOUS
  Filled 2024-02-10: qty 50

## 2024-02-10 MED ORDER — ROSUVASTATIN CALCIUM 5 MG PO TABS
10.0000 mg | ORAL_TABLET | Freq: Every day | ORAL | Status: DC
Start: 2024-02-10 — End: 2024-02-13
  Administered 2024-02-10 – 2024-02-12 (×3): 10 mg via ORAL
  Filled 2024-02-10 (×3): qty 2

## 2024-02-10 MED ORDER — ACETAMINOPHEN 650 MG RE SUPP: 650.0000 mg | Freq: Four times a day (QID) | RECTAL | Status: DC | PRN

## 2024-02-10 MED ORDER — SODIUM CHLORIDE 0.9 % IV SOLN
2.0000 g | INTRAVENOUS | Status: DC
  Administered 2024-02-10 – 2024-02-12 (×3): 2 g via INTRAVENOUS
  Filled 2024-02-10 (×4): qty 20

## 2024-02-10 MED ORDER — POTASSIUM CHLORIDE 10 MEQ/100ML IV SOLN
10.0000 meq | INTRAVENOUS | Status: AC
  Administered 2024-02-10 – 2024-02-11 (×4): 10 meq via INTRAVENOUS
  Filled 2024-02-10 (×3): qty 100

## 2024-02-10 MED ORDER — AMLODIPINE BESYLATE 5 MG PO TABS
5.0000 mg | ORAL_TABLET | Freq: Every day | ORAL | Status: DC
  Administered 2024-02-10 – 2024-02-13 (×4): 5 mg via ORAL
  Filled 2024-02-10 (×4): qty 1

## 2024-02-10 MED ORDER — CARVEDILOL 6.25 MG PO TABS
6.2500 mg | ORAL_TABLET | Freq: Two times a day (BID) | ORAL | Status: DC
  Administered 2024-02-10 – 2024-02-13 (×4): 6.25 mg via ORAL
  Filled 2024-02-10 (×6): qty 1

## 2024-02-10 MED ORDER — SODIUM CHLORIDE 0.9 % IV BOLUS
1000.0000 mL | Freq: Once | INTRAVENOUS | Status: AC
  Administered 2024-02-10: 1000 mL via INTRAVENOUS

## 2024-02-10 MED ORDER — ONDANSETRON HCL 4 MG/2ML IJ SOLN
4.0000 mg | Freq: Once | INTRAMUSCULAR | Status: AC
  Administered 2024-02-10: 4 mg via INTRAVENOUS
  Filled 2024-02-10: qty 2

## 2024-02-10 MED ORDER — ONDANSETRON HCL 4 MG PO TABS: 4.0000 mg | ORAL_TABLET | Freq: Four times a day (QID) | ORAL | Status: DC | PRN

## 2024-02-10 MED ORDER — POTASSIUM CHLORIDE 10 MEQ/100ML IV SOLN
10.0000 meq | INTRAVENOUS | Status: DC
  Administered 2024-02-10: 10 meq via INTRAVENOUS
  Filled 2024-02-10: qty 100

## 2024-02-10 MED ORDER — POTASSIUM CHLORIDE IN NACL 40-0.9 MEQ/L-% IV SOLN
INTRAVENOUS | Status: AC
  Filled 2024-02-10 (×2): qty 1000

## 2024-02-10 MED ORDER — IOHEXOL 350 MG/ML SOLN
60.0000 mL | Freq: Once | INTRAVENOUS | Status: AC | PRN
  Administered 2024-02-10: 60 mL via INTRAVENOUS

## 2024-02-10 MED ORDER — ACETAMINOPHEN 325 MG PO TABS: 650.0000 mg | ORAL_TABLET | Freq: Four times a day (QID) | ORAL | Status: DC | PRN

## 2024-02-10 MED ORDER — METRONIDAZOLE 500 MG/100ML IV SOLN
500.0000 mg | Freq: Two times a day (BID) | INTRAVENOUS | Status: DC
  Administered 2024-02-10 – 2024-02-12 (×5): 500 mg via INTRAVENOUS
  Filled 2024-02-10 (×5): qty 100

## 2024-02-10 NOTE — ED Notes (Signed)
 K+ 2.1- reported by lab/ provider made aware

## 2024-02-10 NOTE — Discharge Summary (Signed)
 History and Physical  Rachael Stone FMW:[id number] DOB: [birth year] DOA: 02/10/2024  PCP: Catalina Bare, MD Patient coming from: Home  I have personally briefly reviewed patient's old medical records in [facility]   Chief Complaint: abdominal pain Nausea and vomiting  HPI: Rachael Stone is a 58 y.o. 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 underwent bronchoscopy in October 2023 and radiation a recent scan in January 2025 showed postradiation fibrosis of 2.1 cm, repeated CT scan showed posterior left upper lobe unchanged measuring 1.3 cm since 2017 but there was a new right lower lobe nodule measuring 4 mm that was felt to be nonspecific no lymphadenopathy, aortic valve replacement on 07/09/2022, dilated nonischemic cardiomyopathy, dysfunctional uterine bleed, alcohol abuse, tobacco abuse and hypertensive heart disease comes in with abdominal pain nausea vomiting and diarrhea yesterday or 2 days prior to admission she denies any changes in her medication any fever cough or shortness of breath.  Nonbloody diarrhea and nonbloody emesis with significant poor oral intake no sick contact or travel history.  In the ED: Was found to be hypertensive, afebrile no leukocytosis, with hypokalemia renal function stable, SARS-CoV-2 influenza PCR and RSV are negative.  UA shows specific gravity of 1013 no signs of infection.  CT scan of the abdomen pelvis showed colitis   Review of Systems: All systems reviewed and apart from history of presenting illness, are negative.  Past Medical History:  Diagnosis Date   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 04/08/2016 Echo: EF 35%, diff HK, mild LVH, Gr2 DD, mod AI, mod to sev MR, mildly dil LA.   COPD (chronic obstructive pulmonary disease) (HCC)    DEPRESSIVE DISORDER NOT ELSEWHERE CLASSIFIED 12/14/2008   no current problem per patient on  04/14/22.  Qualifier: Diagnosis of  By: Jonelle MD, [name]   DYSFUNCTIONAL UTERINE BLEEDING 11/12/2007   Qualifier: Diagnosis of  By: Jonelle MD, [name]     Dyspnea    ETOH abuse    Hypertension    Hypertensive heart disease with heart failure (HCC)    Marijuana abuse    Last use 04/14/22   MENORRHAGIA 12/14/2008   Qualifier: Diagnosis of  By: Jonelle MD, [name]     Moderate aortic insufficiency    a. 04/08/2016 Echo: mod AI.   Moderate to Severe Mitral Regurgitation    a. 04/08/2016 Echo: mod-sev MR directed centrally.   Tobacco abuse    Past Surgical History:  Procedure Laterality Date   AORTIC VALVE REPLACEMENT N/A 07/09/2022   Procedure: AORTIC VALVE REPLACEMENT (AVR) USING INSPIRIS RESILIA AORTIC VALVE;  Surgeon: Shyrl Linnie KIDD, MD;  Location: [facility] OR;  Service: Open Heart Surgery;  Laterality: N/A;   BRONCHIAL BIOPSY  04/15/2022   Procedure: BRONCHIAL BIOPSIES;  Surgeon: Brenna Adine CROME, DO;  Location: [facility] ENDOSCOPY;  Service: Pulmonary;;   BRONCHIAL NEEDLE ASPIRATION BIOPSY  04/15/2022   Procedure: BRONCHIAL NEEDLE ASPIRATION BIOPSIES;  Surgeon: Brenna Adine CROME, DO;  Location: [facility] ENDOSCOPY;  Service: Pulmonary;;   BUBBLE STUDY  05/12/2022   Procedure: BUBBLE STUDY;  Surgeon: Barbaraann Darryle Ned, MD;  Location: [facility] ENDOSCOPY;  Service: Cardiovascular;;   CARDIAC CATHETERIZATION N/A 04/10/2016   Procedure: Right/Left Heart Cath and Coronary Angiography;  Surgeon: Candyce GORMAN Reek, MD;  Location: [facility] INVASIVE CV LAB;  Service: Cardiovascular;  Laterality: N/A;   EXPLORATORY LAPAROTOMY  07/14/2001   Ectopic Pregnancy - unsure which side or if tube was removed   RIGHT/LEFT HEART CATH AND CORONARY ANGIOGRAPHY N/A 05/12/2022   Procedure: RIGHT/LEFT HEART CATH AND CORONARY ANGIOGRAPHY;  Surgeon: Wonda Sharper, MD;  Location: [facility] INVASIVE CV LAB;  Service: Cardiovascular;  Laterality: N/A;   TEE WITHOUT CARDIOVERSION N/A 06/26/2017   Procedure: TRANSESOPHAGEAL ECHOCARDIOGRAM (TEE);   Surgeon: Alveta Aleene PARAS, MD;  Location: [facility] ENDOSCOPY;  Service: Cardiovascular;  Laterality: N/A;   TEE WITHOUT CARDIOVERSION N/A 05/12/2022   Procedure: TRANSESOPHAGEAL ECHOCARDIOGRAM (TEE);  Surgeon: Barbaraann Darryle Ned, MD;  Location: [facility] ENDOSCOPY;  Service: Cardiovascular;  Laterality: N/A;   TEE WITHOUT CARDIOVERSION N/A 07/09/2022   Procedure: TRANSESOPHAGEAL ECHOCARDIOGRAM (TEE);  Surgeon: Shyrl Linnie KIDD, MD;  Location: [facility] OR;  Service: Open Heart Surgery;  Laterality: N/A;   TONSILLECTOMY     TOOTH EXTRACTION N/A 06/26/2022   Procedure: DENTAL RESTORATION/EXTRACTIONS;  Surgeon: Celena Lum NOVAK, DMD;  Location: [facility] OR;  Service: Dentistry;  Laterality: N/A;   TUBAL LIGATION     Social History:  reports that she has been smoking cigarettes. She has a 8.5 pack-year smoking history. She has never used smokeless tobacco. She reports current alcohol use of about 6.0 standard drinks of alcohol per week. She reports current drug use. Drug: Marijuana.   Allergies  Allergen Reactions   Ibuprofen  Other (See Comments)    Irritates stomach    Family History  Problem Relation Age of Onset   CAD Mother        First MI @ 85 - 8 total.   Cirrhosis Father        alcoholic - died in his 31's.   Lupus Sister    Colon cancer Neg Hx    Colon polyps Neg Hx     Prior to Admission medications   Medication Sig Start Date End Date Taking? Authorizing Provider  acetaminophen  (TYLENOL ) 500 MG tablet Take 500 mg by mouth every 8 (eight) hours as needed for moderate pain.    [provider]  albuterol  (VENTOLIN  HFA) 108 (90 Base) MCG/ACT inhaler Inhale into the lungs. 02/09/24   [provider]  amLODipine  (NORVASC ) 5 MG tablet Take 5 mg by mouth daily.    [provider]  aspirin  EC 81 MG tablet Take 81 mg by mouth 2 (two) times a week. Swallow whole.    [provider]  carvedilol  (COREG ) 6.25 MG tablet Take 6.25 mg by mouth 2 (two) times daily. 02/09/24    [provider]  furosemide  (LASIX ) 20 MG tablet Take 1 tablet (20 mg total) by mouth daily. Patient not taking: Reported on 02/05/2024 03/04/23   Nahser, Aleene PARAS, MD  Menthol, Topical Analgesic, (ICY HOT EX) Apply 1 Application topically daily as needed (pain).    [provider]  potassium chloride  SA (KLOR-CON  M) 20 MEQ tablet Take 20 mEq by mouth daily. 02/09/24   [provider]  rosuvastatin  (CRESTOR ) 10 MG tablet Take 1 tablet (10 mg total) by mouth at bedtime. Patient not taking: Reported on 02/05/2024 03/04/23   Nahser, Aleene PARAS, MD  spironolactone  (ALDACTONE ) 25 MG tablet Take 1 tablet (25 mg total) by mouth daily. 04/02/23   Nahser, Aleene PARAS, MD   Physical Exam: Vitals:   02/10/24 1038 02/10/24 1137 02/10/24 1438  BP: (!) 178/110  (!) 171/109  Pulse: (!) 101  97  Resp: 18  (!) 24  Temp: 97.6 F (36.4 C)  97.9 F (36.6 C)  SpO2: 100%  100%  Weight:  67.1 kg   Height:  5' 5.5 (1.664 m)     General exam: Moderately built and nourished patient, lying comfortably supine on the gurney in no obvious distress. Head, eyes and ENT: Nontraumatic and normocephalic. Pupils equally reacting to light and accommodation. Oral mucosa moist. Neck: Supple. No JVD, carotid bruit or thyromegaly. Lymphatics: No lymphadenopathy. Respiratory system: Clear to auscultation. No increased work of breathing. Cardiovascular system: S1 and S2 heard, RRR. No JVD. Gastrointestinal system: Abdomen is nondistended, soft and nontender. Normal bowel sounds heard. No organomegaly or masses appreciated. Extremities: Symmetric 5 x 5 power. Peripheral pulses symmetrically felt.  Skin: No rashes or acute findings. Musculoskeletal system: Negative exam. Psychiatry: Pleasant and cooperative.   Labs on Admission:  Basic Metabolic Panel: Recent Labs  Lab 02/10/24 1215 02/10/24 1520  NA 137  --   K 2.1*  --   CL 95*  --   CO2 28  --   GLUCOSE 134*  --   BUN 5*  --   CREATININE 0.64   --   CALCIUM  9.6  --   MG  --  2.0   Liver Function Tests: Recent Labs  Lab 02/10/24 1215  AST 46*  ALT 29  ALKPHOS 64  BILITOT 0.9  PROT 8.2*  ALBUMIN  3.9   Recent Labs  Lab 02/10/24 1215  LIPASE 25   No results for input(s): AMMONIA in the last 168 hours. CBC: Recent Labs  Lab 02/10/24 1215  WBC 9.1  HGB 15.5*  HCT 45.5  MCV 92.9  PLT 260   Cardiac Enzymes: No results for input(s): CKTOTAL, CKMB, CKMBINDEX, TROPONINI in the last 168 hours.  BNP (last 3 results) No results for input(s): PROBNP in the last 8760 hours. CBG: No results for input(s): GLUCAP in the last 168 hours.  Radiological Exams on Admission: CT ABDOMEN PELVIS W CONTRAST Result Date: 02/10/2024 CLINICAL DATA:  Abdominal pain. EXAM: CT ABDOMEN AND PELVIS WITH CONTRAST TECHNIQUE: Multidetector CT imaging of the abdomen and pelvis was performed using the standard protocol following bolus administration of intravenous contrast. RADIATION DOSE REDUCTION: This exam was performed according to the departmental dose-optimization program which includes automated exposure control, adjustment of the mA and/or kV according to patient size and/or use of iterative reconstruction technique. CONTRAST:  60mL OMNIPAQUE  IOHEXOL  350 MG/ML SOLN COMPARISON:  CT abdomen pelvis dated 03/18/2020. FINDINGS: Lower chest: Emphysema. No intra-abdominal free air or free fluid. Hepatobiliary: Fatty liver. No biliary dilatation. The gallbladder is unremarkable. Pancreas: Unremarkable. No pancreatic ductal dilatation or surrounding inflammatory changes. Spleen: Normal in size without focal abnormality. Adrenals/Urinary Tract: The adrenal glands unremarkable small left renal inferior pole cyst. There is no hydronephrosis on either side. There is symmetric enhancement and excretion of contrast by both kidneys. The visualized ureters and urinary bladder appear unremarkable. Stomach/Bowel: Mild thickened appearance of the distal  colon likely related to underdistention. Colitis is less likely. Clinical correlation is recommended. There is no bowel obstruction. The appendix is normal. Vascular/Lymphatic: Moderate aortoiliac atherosclerotic disease. The IVC is unremarkable. No portal venous gas. There is no adenopathy. Reproductive: The uterus is grossly unremarkable. No suspicious adnexal masses. Other: None Musculoskeletal: No acute osseous pathology.  Degenerative changes IMPRESSION: 1. Underdistention of the distal colon versus less likely mild colitis. Clinical correlation is recommended. No bowel obstruction. Normal appendix. 2. Fatty liver. 3. Aortic Atherosclerosis (ICD10-I70.0) and Emphysema (ICD10-J43.9). Electronically Signed   By: Vanetta Chou M.D.   On: 02/10/2024 16:07   DG Chest  2 View Result Date: 02/10/2024 CLINICAL DATA:  Cough.  Chills.  Diarrhea. EXAM: CHEST - 2 VIEW COMPARISON:  04/17/2023. FINDINGS: Bilateral lung fields are clear. Bilateral costophrenic angles are clear. Normal cardio-mediastinal silhouette. Sternotomy wires noted.  There is prosthetic aortic valve. No acute osseous abnormalities. The soft tissues are within normal limits. IMPRESSION: No active cardiopulmonary disease. Electronically Signed   By: Ree Molt M.D.   On: 02/10/2024 13:52    EKG: Independently reviewed.  Twelve-lead EKG showed sinus rhythm normal axis U waves  Assessment/Plan Hypokalemia She  take potassium supplementation at home which she ran out of about a week ago. She has U waves on EKG Will and she has nausea and vomiting not able to tolerate anything by mouth.  Started on Zofran  for nausea. Will start her on IV potassium supplementation, recheck basic metabolic panel tomorrow. Her mag is 2.0.  Possible viral gastroenteritis: Check a norovirus and a GI panel. Will hydrate gently due to her history of nonischemic cardiomyopathy. Put on contact precautions. CT scan of the abdomen pelvis showed colitis will  start empirically on Cipro  and Flagyl  she denies any melanotic stools she is afebrile with no leukocytosis.  Hypertensive heart disease with heart failure [facility]) Last 2D echo in October 2024 showed an EF of 55% grade 1 diastolic dysfunction right ventricular systolic function was unremarkable mitral valve show mild mitral regurg and aortic valve replacement. Will start her back on her Norvasc , Coreg  and Aldactone  as her blood pressure significantly elevated.  Nonrheumatic aortic valve insufficiency Noted.  Teeth missing Noted.  Alcohol abuse: Base was 26, will monitor with CIWA protocol.   DVT Prophylaxis: lovenox  Code Status: full  Family Communication: none  Disposition Plan: inpatient      It is my clinical opinion that admission to INPATIENT is reasonable and necessary in this 58 y.o. female with nausea and vomiting with severe hypokalemia. Probably viral gastroenteritis admit to the hospital repeat electrolytes and monitor as needed hydrate gently hold diuretic therapy.   Given the aforementioned, the predictability of an adverse outcome is felt to be significant. I expect that the patient will require at least 2 midnights in the hospital to treat this condition.  Erle Odell Castor MD [facility]   02/10/2024, 5:05 PM

## 2024-02-10 NOTE — ED Triage Notes (Signed)
 Patient arrives by POV c/o chills, diarrhea, mid abdominal pain onset of 2 am. Patient states she is concerned for walking pneumonia Denies any sick contacts.

## 2024-02-10 NOTE — ED Provider Notes (Signed)
 Rachael Stone EMERGENCY DEPARTMENT AT [facility] Provider Note  CSN: [id number] Arrival date & time: 02/10/24 1027  Chief Complaint(s) Abdominal Pain  HPI Rachael Stone is a 58 y.o. female with past medical history as below, significant for COPD, alcohol misuse, hypertension, marijuana use, cardiomyopathy who presents to the ED with complaint of abdominal pain, nausea diarrhea  Reports she has been feeling unwell for the past 2 days, been having abdominal cramping, diffuse, nausea without vomiting.  Diarrhea, nonbloody nonmelanotic.  Subjective fevers and chills, poor appetite and poor p.o. intake.  No recent travel or sick contacts, no suspicious p.o. intake.  Reports she is very fatigued.  No rash.  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 04/08/2016 Echo: EF 35%, diff HK, mild LVH, Gr2 DD, mod AI, mod to sev MR, mildly dil LA.   COPD (chronic obstructive pulmonary disease) (HCC)    DEPRESSIVE DISORDER NOT ELSEWHERE CLASSIFIED 12/14/2008   no current problem per patient on 04/14/22.  Qualifier: Diagnosis of  By: Jonelle MD, [name]   DYSFUNCTIONAL UTERINE BLEEDING 11/12/2007   Qualifier: Diagnosis of  By: Jonelle MD, [name]     Dyspnea    ETOH abuse    Hypertension    Hypertensive heart disease with heart failure (HCC)    Marijuana abuse    Last use 04/14/22   MENORRHAGIA 12/14/2008   Qualifier: Diagnosis of  By: Jonelle MD, [name]     Moderate aortic insufficiency    a. 04/08/2016 Echo: mod AI.   Moderate to Severe Mitral Regurgitation    a. 04/08/2016 Echo: mod-sev MR directed centrally.   Tobacco abuse    Patient Active Problem List   Diagnosis Date Noted   AKI (acute kidney injury) (HCC) 04/17/2023   Acute gastroenteritis 04/17/2023   Malignant neoplasm of upper lobe of left lung (HCC) 09/16/2022   S/P AVR (aortic valve  replacement) 07/09/2022   Loss of teeth due to extraction 07/01/2022   Encounter for preoperative dental examination 06/16/2022   Teeth missing 06/16/2022   Caries 06/16/2022   Defective dental restoration 06/16/2022   Accretions on teeth 06/16/2022   Chronic periodontitis 06/16/2022   Attrition, teeth excessive 06/16/2022   Phobia of dental procedure 06/16/2022   Gingival recession, generalized 06/16/2022   Nodule of upper lobe of left lung 03/31/2022   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/16/2021   Sinus tachycardia 05/16/2021   Alcoholism (HCC)    Hypokalemia    Hospital discharge follow-up    BV (bacterial vaginosis) 01/26/2017   Trichomonal infection 01/26/2017   Essential hypertension 11/14/2016   Normal coronary arteries 04/11/2016   Hypertensive heart disease with heart failure (HCC)    NICM (nonischemic cardiomyopathy) (HCC)    Nonrheumatic aortic valve insufficiency    Moderate to Severe Mitral Regurgitation    Tobacco abuse    Acute pulmonary edema (HCC) 04/08/2016   Elevated blood pressure 04/08/2016   Dyspnea    Vaginal leukorrhea 09/10/2009   CHEST PAIN UNSPECIFIED 08/13/2009   DEPRESSIVE DISORDER NOT ELSEWHERE CLASSIFIED 12/14/2008   MENORRHAGIA 12/14/2008   NAUSEA, CHRONIC 12/14/2008   ELEVATED BLOOD PRESSURE WITHOUT DIAGNOSIS OF HYPERTENSION 12/14/2008   DYSFUNCTIONAL UTERINE BLEEDING 11/12/2007   TOBACCO DEPENDENCE 09/10/2006   GASTROESOPHAGEAL REFLUX, NO ESOPHAGITIS 09/10/2006   Home Medication(s) Prior to Admission medications  Medication Sig Start Date End Date Taking? Authorizing Provider  acetaminophen  (TYLENOL ) 500 MG tablet Take 500 mg by mouth every 8 (eight) hours as needed for moderate pain.    [provider]  amLODipine  (NORVASC ) 5 MG tablet Take 5 mg by mouth daily.    [provider]  aspirin  EC 81 MG tablet Take 81 mg by mouth 2 (two) times a week. Swallow whole.    [provider]  furosemide  (LASIX ) 20  MG tablet Take 1 tablet (20 mg total) by mouth daily. Patient not taking: Reported on 02/05/2024 03/04/23   Nahser, Aleene PARAS, MD  gabapentin  (NEURONTIN ) 100 MG capsule Take 100 mg by mouth in the morning and at bedtime. Patient not taking: Reported on 02/05/2024 05/27/22   [provider]  Menthol, Topical Analgesic, (ICY HOT EX) Apply 1 Application topically daily as needed (pain).    [provider]  metoprolol  tartrate (LOPRESSOR ) 50 MG tablet TAKE 1 TABLET BY MOUTH TWICE A DAY Patient not taking: Reported on 02/05/2024 01/12/24   Nahser, Aleene PARAS, MD  potassium chloride  (KLOR-CON ) 10 MEQ tablet Take 2 tablets (20 mEq total) by mouth daily. 04/01/23   Dann Candyce RAMAN, MD  rosuvastatin  (CRESTOR ) 10 MG tablet Take 1 tablet (10 mg total) by mouth at bedtime. Patient not taking: Reported on 02/05/2024 03/04/23   Nahser, Aleene PARAS, MD  spironolactone  (ALDACTONE ) 25 MG tablet Take 1 tablet (25 mg total) by mouth daily. 04/02/23   Nahser, Aleene PARAS, MD                                                                                                                                    Past Surgical History Past Surgical History:  Procedure Laterality Date   AORTIC VALVE REPLACEMENT N/A 07/09/2022   Procedure: AORTIC VALVE REPLACEMENT (AVR) USING INSPIRIS RESILIA AORTIC VALVE;  Surgeon: Shyrl Linnie KIDD, MD;  Location: [facility] OR;  Service: Open Heart Surgery;  Laterality: N/A;   BRONCHIAL BIOPSY  04/15/2022   Procedure: BRONCHIAL BIOPSIES;  Surgeon: Brenna Adine CROME, DO;  Location: [facility] ENDOSCOPY;  Service: Pulmonary;;   BRONCHIAL NEEDLE ASPIRATION BIOPSY  04/15/2022   Procedure: BRONCHIAL NEEDLE ASPIRATION BIOPSIES;  Surgeon: Brenna Adine CROME, DO;  Location: [facility] ENDOSCOPY;  Service: Pulmonary;;   BUBBLE STUDY  05/12/2022   Procedure: BUBBLE STUDY;  Surgeon: Barbaraann Darryle Ned, MD;  Location: [facility] ENDOSCOPY;  Service: Cardiovascular;;   CARDIAC CATHETERIZATION N/A 04/10/2016   Procedure:  Right/Left Heart Cath and Coronary Angiography;  Surgeon: Candyce RAMAN Dann, MD;  Location: [facility] INVASIVE CV LAB;  Service: Cardiovascular;  Laterality: N/A;   EXPLORATORY LAPAROTOMY  07/14/2001   Ectopic Pregnancy - unsure which side or if tube was removed   RIGHT/LEFT HEART CATH AND CORONARY ANGIOGRAPHY N/A 05/12/2022   Procedure: RIGHT/LEFT HEART CATH AND CORONARY ANGIOGRAPHY;  Surgeon: Wonda Sharper, MD;  Location: [facility] INVASIVE CV LAB;  Service: Cardiovascular;  Laterality: N/A;   TEE WITHOUT CARDIOVERSION N/A 06/26/2017   Procedure: TRANSESOPHAGEAL ECHOCARDIOGRAM (TEE);  Surgeon: Alveta Aleene PARAS, MD;  Location: [facility] ENDOSCOPY;  Service: Cardiovascular;  Laterality: N/A;   TEE WITHOUT CARDIOVERSION N/A 05/12/2022   Procedure: TRANSESOPHAGEAL ECHOCARDIOGRAM (TEE);  Surgeon: Barbaraann Darryle Ned, MD;  Location: [facility] ENDOSCOPY;  Service: Cardiovascular;  Laterality: N/A;   TEE WITHOUT CARDIOVERSION N/A 07/09/2022   Procedure: TRANSESOPHAGEAL ECHOCARDIOGRAM (TEE);  Surgeon: Shyrl Linnie KIDD, MD;  Location: [facility] OR;  Service: Open Heart Surgery;  Laterality: N/A;   TONSILLECTOMY     TOOTH EXTRACTION N/A 06/26/2022   Procedure: DENTAL RESTORATION/EXTRACTIONS;  Surgeon: Celena Lum NOVAK, DMD;  Location: [facility] OR;  Service: Dentistry;  Laterality: N/A;   TUBAL LIGATION     Family History Family History  Problem Relation Age of Onset   CAD Mother        First MI @ 11 - 8 total.   Cirrhosis Father        alcoholic - died in his 25's.   Lupus Sister    Colon cancer Neg Hx    Colon polyps Neg Hx     Social History Social History   Tobacco Use   Smoking status: Every Day    Current packs/day: 0.25    Average packs/day: 0.3 packs/day for 34.0 years (8.5 ttl pk-yrs)    Types: Cigarettes   Smokeless tobacco: Never  Vaping Use   Vaping status: Never Used  Substance Use Topics   Alcohol use: Yes    Alcohol/week: 6.0 standard drinks of alcohol    Types: 6 Standard drinks or equivalent per  week   Drug use: Yes    Types: Marijuana    Comment: Smokes marijuana daily.  Used to smoke cocaine laced  marijuana cigarettes for 15-20 yrs but quit cocaine 6 yrs ago.   Allergies Ibuprofen   Review of Systems A thorough review of systems was obtained and all systems are negative except as noted in the HPI and PMH.   Physical Exam Vital Signs  I have reviewed the triage vital signs BP (!) 171/109 (BP Location: Right Arm)   Pulse 97   Temp 97.9 F (36.6 C)   Resp (!) 24   Ht 5' 5.5 (1.664 m)   Wt 67.1 kg   LMP  (LMP Unknown)   SpO2 100%   BMI 24.25 kg/m  Physical Exam Vitals and nursing note reviewed.  Constitutional:      General: She is not in acute distress.    Appearance: Normal appearance.  HENT:     Head: Normocephalic and atraumatic.     Right Ear: External ear normal.     Left Ear: External ear normal.     Nose: Nose normal.     Mouth/Throat:     Mouth: Mucous membranes are dry.  Eyes:     General: No scleral icterus.       Right eye: No discharge.        Left eye: No discharge.  Cardiovascular:     Rate and Rhythm: Normal rate and regular rhythm.     Pulses: Normal pulses.     Heart sounds: Normal heart sounds.  Pulmonary:     Effort: Pulmonary effort is normal. No respiratory distress.     Breath sounds: Normal breath sounds. No stridor.  Abdominal:     General: Abdomen is flat. There is no distension.     Palpations: Abdomen is soft.     Tenderness: There is generalized  abdominal tenderness.  Musculoskeletal:     Cervical back: No rigidity.     Right lower leg: No edema.     Left lower leg: No edema.  Skin:    General: Skin is warm and dry.     Capillary Refill: Capillary refill takes less than 2 seconds.  Neurological:     Mental Status: She is alert.  Psychiatric:        Mood and Affect: Mood normal.        Behavior: Behavior normal. Behavior is cooperative.     ED Results and Treatments Labs (all labs ordered are listed, but only  abnormal results are displayed) Labs Reviewed  COMPREHENSIVE METABOLIC PANEL WITH GFR - Abnormal; Notable for the following components:      Result Value   Potassium 2.1 (*)    Chloride 95 (*)    Glucose, Bld 134 (*)    BUN 5 (*)    Total Protein 8.2 (*)    AST 46 (*)    All other components within normal limits  CBC - Abnormal; Notable for the following components:   Hemoglobin 15.5 (*)    RDW 16.6 (*)    All other components within normal limits  URINALYSIS, ROUTINE W REFLEX MICROSCOPIC - Abnormal; Notable for the following components:   APPearance HAZY (*)    Ketones, ur 20 (*)    Leukocytes,Ua MODERATE (*)    All other components within normal limits  RESP PANEL BY RT-PCR (RSV, FLU A&B, COVID)  RVPGX2  LIPASE, BLOOD  MAGNESIUM                                                                                                                           Radiology CT ABDOMEN PELVIS W CONTRAST Result Date: 02/10/2024 CLINICAL DATA:  Abdominal pain. EXAM: CT ABDOMEN AND PELVIS WITH CONTRAST TECHNIQUE: Multidetector CT imaging of the abdomen and pelvis was performed using the standard protocol following bolus administration of intravenous contrast. RADIATION DOSE REDUCTION: This exam was performed according to the departmental dose-optimization program which includes automated exposure control, adjustment of the mA and/or kV according to patient size and/or use of iterative reconstruction technique. CONTRAST:  60mL OMNIPAQUE  IOHEXOL  350 MG/ML SOLN COMPARISON:  CT abdomen pelvis dated 03/18/2020. FINDINGS: Lower chest: Emphysema. No intra-abdominal free air or free fluid. Hepatobiliary: Fatty liver. No biliary dilatation. The gallbladder is unremarkable. Pancreas: Unremarkable. No pancreatic ductal dilatation or surrounding inflammatory changes. Spleen: Normal in size without focal abnormality. Adrenals/Urinary Tract: The adrenal glands unremarkable small left renal inferior pole cyst. There is no  hydronephrosis on either side. There is symmetric enhancement and excretion of contrast by both kidneys. The visualized ureters and urinary bladder appear unremarkable. Stomach/Bowel: Mild thickened appearance of the distal colon likely related to underdistention. Colitis is less likely. Clinical correlation is recommended. There is no bowel obstruction. The appendix is normal. Vascular/Lymphatic: Moderate aortoiliac atherosclerotic disease. The IVC is unremarkable. No portal venous gas. There is no  adenopathy. Reproductive: The uterus is grossly unremarkable. No suspicious adnexal masses. Other: None Musculoskeletal: No acute osseous pathology.  Degenerative changes IMPRESSION: 1. Underdistention of the distal colon versus less likely mild colitis. Clinical correlation is recommended. No bowel obstruction. Normal appendix. 2. Fatty liver. 3. Aortic Atherosclerosis (ICD10-I70.0) and Emphysema (ICD10-J43.9). Electronically Signed   By: Vanetta Chou M.D.   On: 02/10/2024 16:07   DG Chest 2 View Result Date: 02/10/2024 CLINICAL DATA:  Cough.  Chills.  Diarrhea. EXAM: CHEST - 2 VIEW COMPARISON:  04/17/2023. FINDINGS: Bilateral lung fields are clear. Bilateral costophrenic angles are clear. Normal cardio-mediastinal silhouette. Sternotomy wires noted.  There is prosthetic aortic valve. No acute osseous abnormalities. The soft tissues are within normal limits. IMPRESSION: No active cardiopulmonary disease. Electronically Signed   By: Ree Molt M.D.   On: 02/10/2024 13:52    Pertinent labs & imaging results that were available during my care of the patient were reviewed by me and considered in my medical decision making (see MDM for details).  Medications Ordered in ED Medications  magnesium  sulfate IVPB 2 g 50 mL (2 g Intravenous New Bag/Given 02/10/24 1543)  potassium chloride  10 mEq in 100 mL IVPB (10 mEq Intravenous New Bag/Given 02/10/24 1539)  sodium chloride  0.9 % bolus 1,000 mL (1,000 mLs  Intravenous New Bag/Given 02/10/24 1536)  ondansetron  (ZOFRAN ) injection 4 mg (4 mg Intravenous Given 02/10/24 1527)  famotidine  (PEPCID ) IVPB 20 mg premix (20 mg Intravenous New Bag/Given 02/10/24 1531)  iohexol  (OMNIPAQUE ) 350 MG/ML injection 60 mL (60 mLs Intravenous Contrast Given 02/10/24 1600)                                                                                                                                     Procedures .Critical Care  Performed by: Elnor Jayson LABOR, DO Authorized by: Elnor Jayson LABOR, DO   Critical care provider statement:    Critical care time (minutes):  30   Critical care time was exclusive of:  Separately billable procedures and treating other patients   Critical care was necessary to treat or prevent imminent or life-threatening deterioration of the following conditions:  Metabolic crisis   Critical care was time spent personally by me on the following activities:  Development of treatment plan with patient or surrogate, discussions with consultants, evaluation of patient's response to treatment, examination of patient, ordering and review of laboratory studies, ordering and review of radiographic studies, ordering and performing treatments and interventions, pulse oximetry, re-evaluation of patient's condition, review of old charts and obtaining history from patient or surrogate   Care discussed with: admitting provider     (including critical care time)  Medical Decision Making / ED Course    Medical Decision Making:    NATASSJA OLLIS is a 58 y.o. female with past medical history as below, significant for COPD, alcohol misuse, hypertension, marijuana use, cardiomyopathy who presents to the ED with  complaint of abdominal pain, nausea diarrhea. The complaint involves an extensive differential diagnosis and also carries with it a high risk of complications and morbidity.  Serious etiology was considered. Ddx includes but is not limited to: Differential  diagnosis includes but is not exclusive to acute cholecystitis, intrathoracic causes for epigastric abdominal pain, gastritis, duodenitis, pancreatitis, small bowel or large bowel obstruction, abdominal aortic aneurysm, hernia, gastritis, etc.   Complete initial physical exam performed, notably the patient was in no acute distress, HDS.    Reviewed and confirmed nursing documentation for past medical history, family history, social history.  Vital signs reviewed.    Abdominal pain nausea diarrhea Hypokalemia > - Likely secondary to volume loss and poor p.o. intake - she is no longer taking Lasix , she is taking spironolactone  - Replace potassium, give magnesium  - Hypokalemia likely secondary to volume loss, poor p.o. intake \ - Potassium is 2.1, having nausea, unable to give p.o. potassium.  Recommend admission for continued potassium replacement and treatment of her colitis; she is agreeable     Admit hospitalist                Additional history obtained: -Additional history obtained from na -External records from outside source obtained and reviewed including: Chart review including previous notes, labs, imaging, consultation notes including  Home medications, prior ER visits   Lab Tests: -I ordered, reviewed, and interpreted labs.   The pertinent results include:   Labs Reviewed  COMPREHENSIVE METABOLIC PANEL WITH GFR - Abnormal; Notable for the following components:      Result Value   Potassium 2.1 (*)    Chloride 95 (*)    Glucose, Bld 134 (*)    BUN 5 (*)    Total Protein 8.2 (*)    AST 46 (*)    All other components within normal limits  CBC - Abnormal; Notable for the following components:   Hemoglobin 15.5 (*)    RDW 16.6 (*)    All other components within normal limits  URINALYSIS, ROUTINE W REFLEX MICROSCOPIC - Abnormal; Notable for the following components:   APPearance HAZY (*)    Ketones, ur 20 (*)    Leukocytes,Ua MODERATE (*)    All other  components within normal limits  RESP PANEL BY RT-PCR (RSV, FLU A&B, COVID)  RVPGX2  LIPASE, BLOOD  MAGNESIUM     Notable for hypokalemia  EKG   EKG Interpretation Date/Time:    Ventricular Rate:    PR Interval:    QRS Duration:    QT Interval:    QTC Calculation:   R Axis:      Text Interpretation:           Imaging Studies ordered: I ordered imaging studies including ctap I independently visualized the following imaging with scope of interpretation limited to determining acute life threatening conditions related to emergency care; findings noted above I agree with the radiologist interpretation If any imaging was obtained with contrast I closely monitored patient for any possible adverse reaction a/w contrast administration in the emergency department   Medicines ordered and prescription drug management: Meds ordered this encounter  Medications   magnesium  sulfate IVPB 2 g 50 mL   potassium chloride  10 mEq in 100 mL IVPB   sodium chloride  0.9 % bolus 1,000 mL   ondansetron  (ZOFRAN ) injection 4 mg   famotidine  (PEPCID ) IVPB 20 mg premix   iohexol  (OMNIPAQUE ) 350 MG/ML injection 60 mL    -I have reviewed the patients home medicines  and have made adjustments as needed   Consultations Obtained: I requested consultation with the hospitalist,  and discussed lab and imaging findings as well as pertinent plan    Cardiac Monitoring: The patient was maintained on a cardiac monitor.  I personally viewed and interpreted the cardiac monitored which showed an underlying rhythm of: nsr Continuous pulse oximetry interpreted by myself, 100% on RA.    Social Determinants of Health:  Diagnosis or treatment significantly limited by social determinants of health: current smoker Counseled patient for approximately 3 minutes regarding smoking cessation. Discussed risks of smoking and how they applied and affected their visit here today. Patient not ready to quit at this time, however  will follow up with their primary doctor when they are.   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 04/08/2016 Echo: EF 35%, diff HK, mild LVH, Gr2 DD, mod AI, mod to sev MR, mildly dil LA.   COPD (chronic obstructive pulmonary disease) (HCC)    DEPRESSIVE DISORDER NOT ELSEWHERE CLASSIFIED 12/14/2008   no current problem per patient on 04/14/22.  Qualifier: Diagnosis of  By: Jonelle MD, [name]   DYSFUNCTIONAL UTERINE BLEEDING 11/12/2007   Qualifier: Diagnosis of  By: Jonelle MD, [name]     Dyspnea    ETOH abuse    Hypertension    Hypertensive heart disease with heart failure (HCC)    Marijuana abuse    Last use 04/14/22   MENORRHAGIA 12/14/2008   Qualifier: Diagnosis of  By: Jonelle MD, [name]     Moderate aortic insufficiency    a. 04/08/2016 Echo: mod AI.   Moderate to Severe Mitral Regurgitation    a. 04/08/2016 Echo: mod-sev MR directed centrally.   Tobacco abuse       Dispostion: Disposition decision including need for hospitalization was considered, and patient admitted to the hospital.    Final Clinical Impression(s) / ED Diagnoses Final diagnoses:  Hypokalemia  Colitis  Aortic atherosclerosis (HCC)  Hepatic steatosis        Elnor Jayson LABOR, DO 02/10/24 1639

## 2024-02-10 NOTE — ED Provider Triage Note (Signed)
 Emergency Medicine Provider Triage Evaluation Note  KATTIA SELLEY , a 58 y.o. female  was evaluated in triage.  Pt complains of nausea and nonbloody diarrhea that started this morning.  Reports mild crampy abdominal pain.  Denies any fever or chills.  She does report some dry cough as well and is concern for possible pneumonia.  Review of Systems  Positive: As above Negative: As above  Physical Exam  BP (!) 178/110 (BP Location: Right Arm)   Pulse (!) 101   Temp 97.6 F (36.4 C)   Resp 18   Ht 5' 5.5 (1.664 m)   Wt 67.1 kg   LMP  (LMP Unknown)   SpO2 100%   BMI 24.25 kg/m  Gen:   Awake, no distress   Resp:  Normal effort  MSK:   Moves extremities without difficulty  Other:  No appreciable abdominal tenderness palpation.  No active vomiting  Medical Decision Making  Medically screening exam initiated at 1:13 PM.  Appropriate orders placed.  Belky A Fizer was informed that the remainder of the evaluation will be completed by another provider, this initial triage assessment does not replace that evaluation, and the importance of remaining in the ED until their evaluation is complete.     Veta Palma, PA-C 02/10/24 1314

## 2024-02-11 DIAGNOSIS — E876 Hypokalemia: Secondary | ICD-10-CM | POA: Diagnosis not present

## 2024-02-11 LAB — COMPREHENSIVE METABOLIC PANEL WITH GFR
ALT: 25 U/L (ref 0–44)
AST: 53 U/L — ABNORMAL HIGH (ref 15–41)
Albumin: 3.3 g/dL — ABNORMAL LOW (ref 3.5–5.0)
Alkaline Phosphatase: 52 U/L (ref 38–126)
Anion gap: 11 (ref 5–15)
BUN: <5 mg/dL — ABNORMAL LOW (ref 6–20)
CO2: 24 mmol/L (ref 22–32)
Calcium: 8.6 mg/dL — ABNORMAL LOW (ref 8.9–10.3)
Chloride: 104 mmol/L (ref 98–111)
Creatinine, Ser: 0.86 mg/dL (ref 0.44–1.00)
GFR, Estimated: >60 mL/min (ref >60)
Glucose, Bld: 113 mg/dL — ABNORMAL HIGH (ref 70–99)
Potassium: 2.8 mmol/L — ABNORMAL LOW (ref 3.5–5.1)
Sodium: 139 mmol/L (ref 135–145)
Total Bilirubin: 0.5 mg/dL (ref 0.0–1.2)
Total Protein: 6.8 g/dL (ref 6.5–8.1)

## 2024-02-11 MED ORDER — ONDANSETRON HCL 4 MG/2ML IJ SOLN
4.0000 mg | Freq: Four times a day (QID) | INTRAMUSCULAR | Status: DC | PRN
  Administered 2024-02-11: 4 mg via INTRAVENOUS
  Filled 2024-02-11: qty 2

## 2024-02-11 MED ORDER — POTASSIUM CHLORIDE 10 MEQ/100ML IV SOLN
INTRAVENOUS | Status: AC
  Filled 2024-02-11: qty 100

## 2024-02-11 MED ORDER — POTASSIUM CHLORIDE 20 MEQ PO PACK
40.0000 meq | PACK | Freq: Two times a day (BID) | ORAL | Status: DC
  Administered 2024-02-11 – 2024-02-12 (×3): 40 meq via ORAL
  Filled 2024-02-11 (×3): qty 2

## 2024-02-11 MED ORDER — PROCHLORPERAZINE EDISYLATE 10 MG/2ML IJ SOLN
10.0000 mg | Freq: Four times a day (QID) | INTRAMUSCULAR | Status: DC | PRN
  Administered 2024-02-11: 10 mg via INTRAVENOUS
  Filled 2024-02-11: qty 2

## 2024-02-11 MED ORDER — PANTOPRAZOLE SODIUM 40 MG IV SOLR
40.0000 mg | Freq: Two times a day (BID) | INTRAVENOUS | Status: DC
  Administered 2024-02-11 – 2024-02-13 (×4): 40 mg via INTRAVENOUS
  Filled 2024-02-11 (×4): qty 10

## 2024-02-11 NOTE — H&P (Signed)
 History and Physical   Rachael Stone FMW:[id number] DOB: 1966-02-20 DOA: 02/10/2024   PCP: Catalina Bare, MD Patient coming from: Home   I have personally briefly reviewed patient's old medical records in [facility]     Chief Complaint: abdominal pain Nausea and vomiting   HPI: BRINA UMEDA is a 58 y.o. 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 underwent bronchoscopy in October 2023 and radiation a recent scan in January 2025 showed postradiation fibrosis of 2.1 cm, repeated CT scan showed posterior left upper lobe unchanged measuring 1.3 cm since 2017 but there was a new right lower lobe nodule measuring 4 mm that was felt to be nonspecific no lymphadenopathy, aortic valve replacement on 07/09/2022, dilated nonischemic cardiomyopathy, dysfunctional uterine bleed, alcohol abuse, tobacco abuse and hypertensive heart disease comes in with abdominal pain nausea vomiting and diarrhea yesterday or 2 days prior to admission she denies any changes in her medication any fever cough or shortness of breath.  Nonbloody diarrhea and nonbloody emesis with significant poor oral intake no sick contact or travel history.   In the ED: Was found to be hypertensive, afebrile no leukocytosis, with hypokalemia renal function stable, SARS-CoV-2 influenza PCR and RSV are negative.  UA shows specific gravity of 1013 no signs of infection.  CT scan of the abdomen pelvis showed colitis     Review of Systems: All systems reviewed and apart from history of presenting illness, are negative.       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 04/08/2016 Echo: EF 35%, diff HK, mild LVH, Gr2 DD, mod AI, mod to sev MR, mildly dil LA.   COPD (chronic obstructive pulmonary disease) (HCC)     DEPRESSIVE DISORDER NOT ELSEWHERE CLASSIFIED 12/14/2008    no current  problem per patient on 04/14/22.  Qualifier: Diagnosis of  By: Jonelle MD, [name]   DYSFUNCTIONAL UTERINE BLEEDING 11/12/2007    Qualifier: Diagnosis of  By: Jonelle MD, [name]     Dyspnea     ETOH abuse     Hypertension     Hypertensive heart disease with heart failure (HCC)     Marijuana abuse      Last use 04/14/22   MENORRHAGIA 12/14/2008    Qualifier: Diagnosis of  By: Jonelle MD, [name]     Moderate aortic insufficiency      a. 04/08/2016 Echo: mod AI.   Moderate to Severe Mitral Regurgitation      a. 04/08/2016 Echo: mod-sev MR directed centrally.   Tobacco abuse               Past Surgical History:  Procedure Laterality Date   AORTIC VALVE REPLACEMENT N/A 07/09/2022    Procedure: AORTIC VALVE REPLACEMENT (AVR) USING INSPIRIS RESILIA AORTIC VALVE;  Surgeon: Shyrl Linnie KIDD, MD;  Location: [facility] OR;  Service: Open Heart Surgery;  Laterality: N/A;   BRONCHIAL BIOPSY   04/15/2022    Procedure: BRONCHIAL BIOPSIES;  Surgeon: Brenna Adine CROME, DO;  Location: [facility] ENDOSCOPY;  Service: Pulmonary;;   BRONCHIAL NEEDLE ASPIRATION BIOPSY   04/15/2022    Procedure: BRONCHIAL NEEDLE ASPIRATION BIOPSIES;  Surgeon: Brenna Adine CROME, DO;  Location: [facility] ENDOSCOPY;  Service: Pulmonary;;   BUBBLE STUDY   05/12/2022    Procedure:  BUBBLE STUDY;  Surgeon: Barbaraann Darryle Ned, MD;  Location: [facility] ENDOSCOPY;  Service: Cardiovascular;;   CARDIAC CATHETERIZATION N/A 04/10/2016    Procedure: Right/Left Heart Cath and Coronary Angiography;  Surgeon: Candyce GORMAN Reek, MD;  Location: [facility] INVASIVE CV LAB;  Service: Cardiovascular;  Laterality: N/A;   EXPLORATORY LAPAROTOMY   07/14/2001    Ectopic Pregnancy - unsure which side or if tube was removed   RIGHT/LEFT HEART CATH AND CORONARY ANGIOGRAPHY N/A 05/12/2022    Procedure: RIGHT/LEFT HEART CATH AND CORONARY ANGIOGRAPHY;  Surgeon: Wonda Sharper, MD;  Location: [facility] INVASIVE CV LAB;  Service: Cardiovascular;  Laterality: N/A;   TEE WITHOUT CARDIOVERSION N/A  06/26/2017    Procedure: TRANSESOPHAGEAL ECHOCARDIOGRAM (TEE);  Surgeon: Alveta Aleene PARAS, MD;  Location: [facility] ENDOSCOPY;  Service: Cardiovascular;  Laterality: N/A;   TEE WITHOUT CARDIOVERSION N/A 05/12/2022    Procedure: TRANSESOPHAGEAL ECHOCARDIOGRAM (TEE);  Surgeon: Barbaraann Darryle Ned, MD;  Location: [facility] ENDOSCOPY;  Service: Cardiovascular;  Laterality: N/A;   TEE WITHOUT CARDIOVERSION N/A 07/09/2022    Procedure: TRANSESOPHAGEAL ECHOCARDIOGRAM (TEE);  Surgeon: Shyrl Linnie KIDD, MD;  Location: [facility] OR;  Service: Open Heart Surgery;  Laterality: N/A;   TONSILLECTOMY       TOOTH EXTRACTION N/A 06/26/2022    Procedure: DENTAL RESTORATION/EXTRACTIONS;  Surgeon: Celena Lum NOVAK, DMD;  Location: [facility] OR;  Service: Dentistry;  Laterality: N/A;   TUBAL LIGATION            Social History:  reports that she has been smoking cigarettes. She has a 8.5 pack-year smoking history. She has never used smokeless tobacco. She reports current alcohol use of about 6.0 standard drinks of alcohol per week. She reports current drug use. Drug: Marijuana.     Allergies       Allergies  Allergen Reactions   Ibuprofen  Other (See Comments)      Irritates stomach             Family History  Problem Relation Age of Onset   CAD Mother          First MI @ 7 - 8 total.   Cirrhosis Father          alcoholic - died in his 54's.   Lupus Sister     Colon cancer Neg Hx     Colon polyps Neg Hx                   Prior to Admission medications   Medication Sig Start Date End Date Taking? Authorizing Provider  acetaminophen  (TYLENOL ) 500 MG tablet Take 500 mg by mouth every 8 (eight) hours as needed for moderate pain.       [provider]  albuterol  (VENTOLIN  HFA) 108 (90 Base) MCG/ACT inhaler Inhale into the lungs. 02/09/24     [provider]  amLODipine  (NORVASC ) 5 MG tablet Take 5 mg by mouth daily.       [provider]  aspirin  EC 81 MG tablet Take 81 mg by mouth 2 (two)  times a week. Swallow whole.       [provider]  carvedilol  (COREG ) 6.25 MG tablet Take 6.25 mg by mouth 2 (two) times daily. 02/09/24     [provider]  furosemide  (LASIX ) 20 MG tablet Take 1 tablet (20 mg total) by mouth daily. Patient not taking: Reported on 02/05/2024 03/04/23     Nahser, Aleene PARAS, MD  Menthol, Topical Analgesic, (ICY HOT EX) Apply 1 Application topically daily as needed (  pain).       [provider]  potassium chloride  SA (KLOR-CON  M) 20 MEQ tablet Take 20 mEq by mouth daily. 02/09/24     [provider]  rosuvastatin  (CRESTOR ) 10 MG tablet Take 1 tablet (10 mg total) by mouth at bedtime. Patient not taking: Reported on 02/05/2024 03/04/23     Nahser, Aleene PARAS, MD  spironolactone  (ALDACTONE ) 25 MG tablet Take 1 tablet (25 mg total) by mouth daily. 04/02/23     Nahser, Aleene PARAS, MD    Physical Exam:      Vitals:    02/10/24 1038 02/10/24 1137 02/10/24 1438  BP: (!) 178/110   (!) 171/109  Pulse: (!) 101   97  Resp: 18   (!) 24  Temp: 97.6 F (36.4 C)   97.9 F (36.6 C)  SpO2: 100%   100%  Weight:   67.1 kg    Height:   5' 5.5 (1.664 m)        General exam: Moderately built and nourished patient, lying comfortably supine on the gurney in no obvious distress. Head, eyes and ENT: Nontraumatic and normocephalic. Pupils equally reacting to light and accommodation. Oral mucosa moist. Neck: Supple. No JVD, carotid bruit or thyromegaly. Lymphatics: No lymphadenopathy. Respiratory system: Clear to auscultation. No increased work of breathing. Cardiovascular system: S1 and S2 heard, RRR. No JVD. Gastrointestinal system: Abdomen is nondistended, soft and nontender. Normal bowel sounds heard. No organomegaly or masses appreciated. Extremities: Symmetric 5 x 5 power. Peripheral pulses symmetrically felt.  Skin: No rashes or acute findings. Musculoskeletal system: Negative exam. Psychiatry: Pleasant and cooperative.     Labs on Admission:   Basic Metabolic Panel: Last Labs      Recent Labs  Lab 02/10/24 1215 02/10/24 1520  NA 137  --   K 2.1*  --   CL 95*  --   CO2 28  --   GLUCOSE 134*  --   BUN 5*  --   CREATININE 0.64  --   CALCIUM  9.6  --   MG  --  2.0      Liver Function Tests: Last Labs     Recent Labs  Lab 02/10/24 1215  AST 46*  ALT 29  ALKPHOS 64  BILITOT 0.9  PROT 8.2*  ALBUMIN  3.9      Last Labs     Recent Labs  Lab 02/10/24 1215  LIPASE 25      Last Labs  No results for input(s): AMMONIA in the last 168 hours.   CBC: Last Labs     Recent Labs  Lab 02/10/24 1215  WBC 9.1  HGB 15.5*  HCT 45.5  MCV 92.9  PLT 260      Cardiac Enzymes: Last Labs  No results for input(s): CKTOTAL, CKMB, CKMBINDEX, TROPONINI in the last 168 hours.     BNP (last 3 results) Recent Labs (within last 365 days)  No results for input(s): PROBNP in the last 8760 hours.   CBG: Last Labs  No results for input(s): GLUCAP in the last 168 hours.     Radiological Exams on Admission:  Imaging Results (Last 48 hours)  CT ABDOMEN PELVIS W CONTRAST Result Date: 02/10/2024 CLINICAL DATA:  Abdominal pain. EXAM: CT ABDOMEN AND PELVIS WITH CONTRAST TECHNIQUE: Multidetector CT imaging of the abdomen and pelvis was performed using the standard protocol following bolus administration of intravenous contrast. RADIATION DOSE REDUCTION: This exam was performed according to the departmental dose-optimization program which includes automated  exposure control, adjustment of the mA and/or kV according to patient size and/or use of iterative reconstruction technique. CONTRAST:  60mL OMNIPAQUE  IOHEXOL  350 MG/ML SOLN COMPARISON:  CT abdomen pelvis dated 03/18/2020. FINDINGS: Lower chest: Emphysema. No intra-abdominal free air or free fluid. Hepatobiliary: Fatty liver. No biliary dilatation. The gallbladder is unremarkable. Pancreas: Unremarkable. No pancreatic ductal dilatation or surrounding inflammatory  changes. Spleen: Normal in size without focal abnormality. Adrenals/Urinary Tract: The adrenal glands unremarkable small left renal inferior pole cyst. There is no hydronephrosis on either side. There is symmetric enhancement and excretion of contrast by both kidneys. The visualized ureters and urinary bladder appear unremarkable. Stomach/Bowel: Mild thickened appearance of the distal colon likely related to underdistention. Colitis is less likely. Clinical correlation is recommended. There is no bowel obstruction. The appendix is normal. Vascular/Lymphatic: Moderate aortoiliac atherosclerotic disease. The IVC is unremarkable. No portal venous gas. There is no adenopathy. Reproductive: The uterus is grossly unremarkable. No suspicious adnexal masses. Other: None Musculoskeletal: No acute osseous pathology.  Degenerative changes IMPRESSION: 1. Underdistention of the distal colon versus less likely mild colitis. Clinical correlation is recommended. No bowel obstruction. Normal appendix. 2. Fatty liver. 3. Aortic Atherosclerosis (ICD10-I70.0) and Emphysema (ICD10-J43.9). Electronically Signed   By: Vanetta Chou M.D.   On: 02/10/2024 16:07    DG Chest 2 View Result Date: 02/10/2024 CLINICAL DATA:  Cough.  Chills.  Diarrhea. EXAM: CHEST - 2 VIEW COMPARISON:  04/17/2023. FINDINGS: Bilateral lung fields are clear. Bilateral costophrenic angles are clear. Normal cardio-mediastinal silhouette. Sternotomy wires noted.  There is prosthetic aortic valve. No acute osseous abnormalities. The soft tissues are within normal limits. IMPRESSION: No active cardiopulmonary disease. Electronically Signed   By: Ree Molt M.D.   On: 02/10/2024 13:52       EKG: Independently reviewed.  Twelve-lead EKG showed sinus rhythm normal axis U waves   Assessment/Plan Hypokalemia She  take potassium supplementation at home which she ran out of about a week ago. She has U waves on EKG Will and she has nausea and vomiting not able  to tolerate anything by mouth.  Started on Zofran  for nausea. Will start her on IV potassium supplementation, recheck basic metabolic panel tomorrow. Her mag is 2.0.   Possible viral gastroenteritis: Check a norovirus and a GI panel. Will hydrate gently due to her history of nonischemic cardiomyopathy. Put on contact precautions. CT scan of the abdomen pelvis showed colitis will start empirically on Cipro  and Flagyl  she denies any melanotic stools she is afebrile with no leukocytosis.   Hypertensive heart disease with heart failure [facility]) Last 2D echo in October 2024 showed an EF of 55% grade 1 diastolic dysfunction right ventricular systolic function was unremarkable mitral valve show mild mitral regurg and aortic valve replacement. Will start her back on her Norvasc , Coreg  and Aldactone  as her blood pressure significantly elevated.   Nonrheumatic aortic valve insufficiency Noted.   Teeth missing Noted.   Alcohol abuse: Base was 26, will monitor with CIWA protocol.   DVT Prophylaxis: lovenox  Code Status: full  Family Communication: none  Disposition Plan: inpatient          It is my clinical opinion that admission to INPATIENT is reasonable and necessary in this 59 y.o. female with nausea and vomiting with severe hypokalemia. Probably viral gastroenteritis admit to the hospital repeat electrolytes and monitor as needed hydrate gently hold diuretic therapy.     Given the aforementioned, the predictability of an adverse outcome is felt to be significant.  I expect that the patient will require at least 2 midnights in the hospital to treat this condition.   Erle Odell Castor MD [facility]    02/10/2024, 5:05 PM

## 2024-02-11 NOTE — Progress Notes (Signed)
 [facility] PROGRESS NOTE    Progress Note  Rachael Stone  FMW:[id number] DOB: 03-26-1966 DOA: 02/10/2024 PCP: Catalina Bare, MD     Brief Narrative:   Rachael Stone is an 58 y.o. 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 underwent bronchoscopy in October 2023 and radiation, felt not to be surgical candidate, a recent scan in January 2025 showed postradiation fibrosis of 2.1 cm, repeated CT scan showed posterior left upper lobe unchanged measuring 1.3 cm since 2017, aortic valve replacement on 07/09/2022, dilated nonischemic cardiomyopathy, dysfunctional uterine bleed, alcohol abuse, tobacco abuse and hypertensive heart disease comes in with abdominal pain nausea vomiting and diarrhea yesterday or 2 days prior to admission    Assessment/Plan:   Hypokalemia Likely due to diarrhea she was started on IV potassium basic metabolic panel is pending this morning. Magnesium  is 2.0. As her potassium is improved she can probably be discharged today.  Intractable nausea and vomiting possibly due to viral gastroenteritis Respiratory panel was negative, CT scan of the abdomen pelvis showed possible colitis. She was started empirically on Rocephin  and Flagyl . Norovirus and GI panel are pending.  Prolonged QTc: Try to keep potassium greater than 4 magnesium  greater than 2.  Hypertensive heart disease: She started back on her Norvasc , Coreg  and her blood pressure is well-controlled.  Nonrheumatic aortic valve insufficiency: Status post aortic valve replacement. Currently stable.  Missing teeth: Noted.  TOBACCO DEPENDENCE Noted.  Alcohol abuse: Continue to monitor.   DVT prophylaxis: lovenox  Family Communication:none Status is: Inpatient Remains inpatient appropriate because: Hypokalemia and diarrhea    Code Status:     Code Status Orders  (From admission, onward)           Start      Ordered   02/10/24 1709  Full code  Continuous       Question:  By:  Answer:  Consent: discussion documented in EHR   02/10/24 1714           Code Status History     Date Active Date Inactive Code Status Order ID Comments User Context   04/17/2023 1407 04/18/2023 1616 Full Code [id number]  Celinda Alm Lot, MD ED   07/09/2022 1213 07/17/2022 1736 Full Code [id number]  Ricke Con BROCKS, PA-C Inpatient   05/12/2022 1002 05/12/2022 1731 Full Code [id number]  Wonda Sharper, MD Inpatient   04/08/2016 0031 04/11/2016 1457 Full Code [id number]  Franky Redia SAILOR, MD Inpatient         IV Access:   Peripheral IV   Procedures and diagnostic studies:   CT ABDOMEN PELVIS W CONTRAST Result Date: 02/10/2024 CLINICAL DATA:  Abdominal pain. EXAM: CT ABDOMEN AND PELVIS WITH CONTRAST TECHNIQUE: Multidetector CT imaging of the abdomen and pelvis was performed using the standard protocol following bolus administration of intravenous contrast. RADIATION DOSE REDUCTION: This exam was performed according to the departmental dose-optimization program which includes automated exposure control, adjustment of the mA and/or kV according to patient size and/or use of iterative reconstruction technique. CONTRAST:  60mL OMNIPAQUE  IOHEXOL  350 MG/ML SOLN COMPARISON:  CT abdomen pelvis dated 03/18/2020. FINDINGS: Lower chest: Emphysema. No intra-abdominal free air or free fluid. Hepatobiliary: Fatty liver. No biliary dilatation. The gallbladder is unremarkable. Pancreas: Unremarkable. No pancreatic ductal dilatation or surrounding inflammatory changes. Spleen: Normal in size without focal abnormality. Adrenals/Urinary Tract: The adrenal glands unremarkable small left renal inferior pole cyst. There is no hydronephrosis on either side. There is symmetric enhancement  and excretion of contrast by both kidneys. The visualized ureters and urinary bladder appear unremarkable. Stomach/Bowel: Mild thickened appearance of the  distal colon likely related to underdistention. Colitis is less likely. Clinical correlation is recommended. There is no bowel obstruction. The appendix is normal. Vascular/Lymphatic: Moderate aortoiliac atherosclerotic disease. The IVC is unremarkable. No portal venous gas. There is no adenopathy. Reproductive: The uterus is grossly unremarkable. No suspicious adnexal masses. Other: None Musculoskeletal: No acute osseous pathology.  Degenerative changes IMPRESSION: 1. Underdistention of the distal colon versus less likely mild colitis. Clinical correlation is recommended. No bowel obstruction. Normal appendix. 2. Fatty liver. 3. Aortic Atherosclerosis (ICD10-I70.0) and Emphysema (ICD10-J43.9). Electronically Signed   By: Vanetta Chou M.D.   On: 02/10/2024 16:07   DG Chest 2 View Result Date: 02/10/2024 CLINICAL DATA:  Cough.  Chills.  Diarrhea. EXAM: CHEST - 2 VIEW COMPARISON:  04/17/2023. FINDINGS: Bilateral lung fields are clear. Bilateral costophrenic angles are clear. Normal cardio-mediastinal silhouette. Sternotomy wires noted.  There is prosthetic aortic valve. No acute osseous abnormalities. The soft tissues are within normal limits. IMPRESSION: No active cardiopulmonary disease. Electronically Signed   By: Ree Molt M.D.   On: 02/10/2024 13:52     Medical Consultants:   None.   Subjective:    Rachael Stone she relates no further diarrhea.  Objective:    Vitals:   02/10/24 1438 02/10/24 1800 02/10/24 1843 02/10/24 1948  BP: (!) 171/109 128/88 128/88 110/76  Pulse: 97 90 90 85  Resp: (!) 24 16 16 16   Temp: 97.9 F (36.6 C) 97.7 F (36.5 C) 97.7 F (36.5 C) 98.1 F (36.7 C)  TempSrc:  Oral Oral   SpO2: 100% 100%  99%  Weight:   67 kg   Height:   5' 6 (1.676 m)    SpO2: 99 %   Intake/Output Summary (Last 24 hours) at 02/11/2024 0630 Last data filed at 02/10/2024 1800 Gross per 24 hour  Intake 173.78 ml  Output --  Net 173.78 ml   Filed Weights   02/10/24  1137 02/10/24 1843  Weight: 67.1 kg 67 kg    Exam: General exam: In no acute distress. Respiratory system: Good air movement and clear to auscultation. Cardiovascular system: S1 & S2 heard, RRR. No JVD. Gastrointestinal system: Abdomen is nondistended, soft and nontender.  Extremities: No pedal edema. Skin: No rashes, lesions or ulcers Psychiatry: Judgement and insight appear normal. Mood & affect appropriate.    Data Reviewed:    Labs: Basic Metabolic Panel: Recent Labs  Lab 02/10/24 1215 02/10/24 1520 02/10/24 1847  NA 137  --   --   K 2.1*  --   --   CL 95*  --   --   CO2 28  --   --   GLUCOSE 134*  --   --   BUN 5*  --   --   CREATININE 0.64  --  0.59  CALCIUM  9.6  --   --   MG  --  2.0  --    GFR Estimated Creatinine Clearance: 73.5 mL/min (by C-G formula based on SCr of 0.59 mg/dL). Liver Function Tests: Recent Labs  Lab 02/10/24 1215  AST 46*  ALT 29  ALKPHOS 64  BILITOT 0.9  PROT 8.2*  ALBUMIN  3.9   Recent Labs  Lab 02/10/24 1215  LIPASE 25   No results for input(s): AMMONIA in the last 168 hours. Coagulation profile No results for input(s): INR, PROTIME in the last 168  hours. COVID-19 Labs  No results for input(s): DDIMER, FERRITIN, LDH, CRP in the last 72 hours.  Lab Results  Component Value Date   SARSCOV2NAA NEGATIVE 02/10/2024   SARSCOV2NAA NEGATIVE 04/17/2023   SARSCOV2NAA NEGATIVE 07/08/2022   SARSCOV2NAA NEGATIVE 04/15/2022    CBC: Recent Labs  Lab 02/10/24 1215 02/10/24 1847  WBC 9.1 10.7*  HGB 15.5* 13.8  HCT 45.5 39.8  MCV 92.9 92.1  PLT 260 228   Cardiac Enzymes: No results for input(s): CKTOTAL, CKMB, CKMBINDEX, TROPONINI in the last 168 hours. BNP (last 3 results) No results for input(s): PROBNP in the last 8760 hours. CBG: No results for input(s): GLUCAP in the last 168 hours. D-Dimer: No results for input(s): DDIMER in the last 72 hours. Hgb A1c: No results for input(s): HGBA1C  in the last 72 hours. Lipid Profile: No results for input(s): CHOL, HDL, LDLCALC, TRIG, CHOLHDL, LDLDIRECT in the last 72 hours. Thyroid  function studies: No results for input(s): TSH, T4TOTAL, T3FREE, THYROIDAB in the last 72 hours.  Invalid input(s): FREET3 Anemia work up: No results for input(s): VITAMINB12, FOLATE, FERRITIN, TIBC, IRON, RETICCTPCT in the last 72 hours. Sepsis Labs: Recent Labs  Lab 02/10/24 1215 02/10/24 1847  WBC 9.1 10.7*   Microbiology Recent Results (from the past 240 hours)  Resp panel by RT-PCR (RSV, Flu A&B, Covid) Anterior Nasal Swab     Status: None   Collection Time: 02/10/24  2:34 PM   Specimen: Anterior Nasal Swab  Result Value Ref Range Status   SARS Coronavirus 2 by RT PCR NEGATIVE NEGATIVE Final   Influenza A by PCR NEGATIVE NEGATIVE Final   Influenza B by PCR NEGATIVE NEGATIVE Final    Comment: (NOTE) The Xpert Xpress SARS-CoV-2/FLU/RSV plus assay is intended as an aid in the diagnosis of influenza from Nasopharyngeal swab specimens and should not be used as a sole basis for treatment. Nasal washings and aspirates are unacceptable for Xpert Xpress SARS-CoV-2/FLU/RSV testing.  Fact Sheet for Patients: BloggerCourse.com  Fact Sheet for Healthcare Providers: SeriousBroker.it  This test is not yet approved or cleared by the [place]  FDA and has been authorized for detection and/or diagnosis of SARS-CoV-2 by FDA under an Emergency Use Authorization (EUA). This EUA will remain in effect (meaning this test can be used) for the duration of the COVID-19 declaration under Section 564(b)(1) of the Act, 21 U.S.C. section 360bbb-3(b)(1), unless the authorization is terminated or revoked.     Resp Syncytial Virus by PCR NEGATIVE NEGATIVE Final    Comment: (NOTE) Fact Sheet for Patients: BloggerCourse.com  Fact Sheet for Healthcare  Providers: SeriousBroker.it  This test is not yet approved or cleared by the [place]  FDA and has been authorized for detection and/or diagnosis of SARS-CoV-2 by FDA under an Emergency Use Authorization (EUA). This EUA will remain in effect (meaning this test can be used) for the duration of the COVID-19 declaration under Section 564(b)(1) of the Act, 21 U.S.C. section 360bbb-3(b)(1), unless the authorization is terminated or revoked.  Performed at [facility], [address] [place], [place] [address]      Medications:    amLODipine   5 mg Oral Daily   aspirin  EC  81 mg Oral Daily   carvedilol   6.25 mg Oral BID   enoxaparin  (LOVENOX ) injection  40 mg Subcutaneous Q24H   rosuvastatin   10 mg Oral QHS   Continuous Infusions:  0.9 % NaCl with KCl 40 mEq / L 50 mL/hr at 02/10/24 2028   cefTRIAXone  (  ROCEPHIN )  IV 2 g (02/10/24 2038)   metronidazole  500 mg (02/10/24 2132)   potassium chloride         LOS: 1 day   Rachael Stone  [facility]  02/11/2024, 6:30 AM

## 2024-02-12 DIAGNOSIS — E876 Hypokalemia: Secondary | ICD-10-CM | POA: Diagnosis not present

## 2024-02-12 LAB — BASIC METABOLIC PANEL WITH GFR
Anion gap: 12 (ref 5–15)
BUN: <5 mg/dL — ABNORMAL LOW (ref 6–20)
CO2: 28 mmol/L (ref 22–32)
Calcium: 8.5 mg/dL — ABNORMAL LOW (ref 8.9–10.3)
Chloride: 98 mmol/L (ref 98–111)
Creatinine, Ser: 0.77 mg/dL (ref 0.44–1.00)
GFR, Estimated: >60 mL/min (ref >60)
Glucose, Bld: 97 mg/dL (ref 70–99)
Potassium: 2.5 mmol/L — CL (ref 3.5–5.1)
Sodium: 138 mmol/L (ref 135–145)

## 2024-02-12 LAB — GLUCOSE, CAPILLARY: Glucose-Capillary: 99 mg/dL (ref 70–99)

## 2024-02-12 MED ORDER — POTASSIUM CHLORIDE 20 MEQ PO PACK
40.0000 meq | PACK | Freq: Two times a day (BID) | ORAL | Status: AC
  Administered 2024-02-12: 40 meq via ORAL
  Filled 2024-02-12 (×2): qty 2

## 2024-02-12 NOTE — Progress Notes (Addendum)
 [facility] PROGRESS NOTE    Progress Note  KIMMERLY LORA  FMW:[id number] DOB: [birth year] DOA: 02/10/2024 PCP: Catalina Bare, MD     Brief Narrative:   Rachael Stone is an 58 y.o. 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 underwent bronchoscopy in October 2023 and radiation, felt not to be surgical candidate, a recent scan in January 2025 showed postradiation fibrosis of 2.1 cm, repeated CT scan showed posterior left upper lobe unchanged measuring 1.3 cm since 2017, aortic valve replacement on 07/09/2022, dilated nonischemic cardiomyopathy, dysfunctional uterine bleed, alcohol abuse, tobacco abuse and hypertensive heart disease comes in with abdominal pain nausea vomiting and diarrhea yesterday or 2 days prior to admission    Assessment/Plan:   Hypokalemia Basic metabolic panels pending this morning potassium was replaced orally yesterday. Magnesium  is 2.0. No further nausea.  Intractable nausea and vomiting possibly due to viral gastroenteritis Respiratory panel was negative, CT scan of the abdomen pelvis showed possible colitis. Continue Rocephin  and Flagyl , no further diarrhea. Continue Protonix  twice a day.  Prolonged QTc: Try to keep potassium greater than 4 magnesium  greater than 2. Basic metabolic panels pending this morning potassium was repleted yesterday.  Hypertensive heart disease/Chronic diastolic dysfunction: She started back on her Norvasc , Coreg  and her blood pressure is well-controlled.  Nonrheumatic aortic valve insufficiency: Status post aortic valve replacement. Currently stable.  Missing teeth: Noted.  TOBACCO DEPENDENCE Noted.  Alcohol abuse: Continue to monitor.   DVT prophylaxis: lovenox  Family Communication:none Status is: Inpatient Remains inpatient appropriate because: Hypokalemia and diarrhea    Code Status:     Code Status Orders  (From admission,  onward)           Start     Ordered   02/10/24 1709  Full code  Continuous       Question:  By:  Answer:  Consent: discussion documented in EHR   02/10/24 1714           Code Status History     Date Active Date Inactive Code Status Order ID Comments User Context   04/17/2023 1407 04/18/2023 1616 Full Code [id number]  Celinda Alm Lot, MD ED   07/09/2022 1213 07/17/2022 1736 Full Code [id number]  Ricke Con BROCKS, PA-C Inpatient   05/12/2022 1002 05/12/2022 1731 Full Code [id number]  Wonda Sharper, MD Inpatient   04/08/2016 0031 04/11/2016 1457 Full Code [id number]  Franky Redia SAILOR, MD Inpatient         IV Access:   Peripheral IV   Procedures and diagnostic studies:   CT ABDOMEN PELVIS W CONTRAST Result Date: 02/10/2024 CLINICAL DATA:  Abdominal pain. EXAM: CT ABDOMEN AND PELVIS WITH CONTRAST TECHNIQUE: Multidetector CT imaging of the abdomen and pelvis was performed using the standard protocol following bolus administration of intravenous contrast. RADIATION DOSE REDUCTION: This exam was performed according to the departmental dose-optimization program which includes automated exposure control, adjustment of the mA and/or kV according to patient size and/or use of iterative reconstruction technique. CONTRAST:  60mL OMNIPAQUE  IOHEXOL  350 MG/ML SOLN COMPARISON:  CT abdomen pelvis dated 03/18/2020. FINDINGS: Lower chest: Emphysema. No intra-abdominal free air or free fluid. Hepatobiliary: Fatty liver. No biliary dilatation. The gallbladder is unremarkable. Pancreas: Unremarkable. No pancreatic ductal dilatation or surrounding inflammatory changes. Spleen: Normal in size without focal abnormality. Adrenals/Urinary Tract: The adrenal glands unremarkable small left renal inferior pole cyst. There is no hydronephrosis on either side. There is symmetric enhancement and excretion of contrast  by both kidneys. The visualized ureters and urinary bladder appear unremarkable. Stomach/Bowel:  Mild thickened appearance of the distal colon likely related to underdistention. Colitis is less likely. Clinical correlation is recommended. There is no bowel obstruction. The appendix is normal. Vascular/Lymphatic: Moderate aortoiliac atherosclerotic disease. The IVC is unremarkable. No portal venous gas. There is no adenopathy. Reproductive: The uterus is grossly unremarkable. No suspicious adnexal masses. Other: None Musculoskeletal: No acute osseous pathology.  Degenerative changes IMPRESSION: 1. Underdistention of the distal colon versus less likely mild colitis. Clinical correlation is recommended. No bowel obstruction. Normal appendix. 2. Fatty liver. 3. Aortic Atherosclerosis (ICD10-I70.0) and Emphysema (ICD10-J43.9). Electronically Signed   By: Vanetta Chou M.D.   On: 02/10/2024 16:07   DG Chest 2 View Result Date: 02/10/2024 CLINICAL DATA:  Cough.  Chills.  Diarrhea. EXAM: CHEST - 2 VIEW COMPARISON:  04/17/2023. FINDINGS: Bilateral lung fields are clear. Bilateral costophrenic angles are clear. Normal cardio-mediastinal silhouette. Sternotomy wires noted.  There is prosthetic aortic valve. No acute osseous abnormalities. The soft tissues are within normal limits. IMPRESSION: No active cardiopulmonary disease. Electronically Signed   By: Ree Molt M.D.   On: 02/10/2024 13:52     Medical Consultants:   None.   Subjective:    Madalee A Kluttz for the diarrhea and nausea is improved.  Objective:    Vitals:   02/11/24 2110 02/12/24 0525 02/12/24 0527 02/12/24 0749  BP: (!) 133/93 129/89 117/85 115/81  Pulse: 81 80  72  Resp: 16   18  Temp: 97.6 F (36.4 C) 97.8 F (36.6 C)  97.9 F (36.6 C)  TempSrc: Oral Oral  Oral  SpO2: 100% 100%  100%  Weight:      Height:       SpO2: 100 %   Intake/Output Summary (Last 24 hours) at 02/12/2024 0832 Last data filed at 02/11/2024 1819 Gross per 24 hour  Intake 1306.62 ml  Output --  Net 1306.62 ml   Filed Weights   02/10/24  1137 02/10/24 1843  Weight: 67.1 kg 67 kg    Exam: General exam: In no acute distress. Respiratory system: Good air movement and clear to auscultation. Cardiovascular system: S1 & S2 heard, RRR. No JVD. Gastrointestinal system: Abdomen is nondistended, soft and nontender.  Extremities: No pedal edema. Skin: No rashes, lesions or ulcers Psychiatry: Judgement and insight appear normal. Mood & affect appropriate.   Data Reviewed:    Labs: Basic Metabolic Panel: Recent Labs  Lab 02/10/24 1215 02/10/24 1520 02/10/24 1847 02/11/24 0629  NA 137  --   --  139  K 2.1*  --   --  2.8*  CL 95*  --   --  104  CO2 28  --   --  24  GLUCOSE 134*  --   --  113*  BUN 5*  --   --  <5*  CREATININE 0.64  --  0.59 0.86  CALCIUM  9.6  --   --  8.6*  MG  --  2.0  --   --    GFR Estimated Creatinine Clearance: 68.4 mL/min (by C-G formula based on SCr of 0.86 mg/dL). Liver Function Tests: Recent Labs  Lab 02/10/24 1215 02/11/24 0629  AST 46* 53*  ALT 29 25  ALKPHOS 64 52  BILITOT 0.9 0.5  PROT 8.2* 6.8  ALBUMIN  3.9 3.3*   Recent Labs  Lab 02/10/24 1215  LIPASE 25   No results for input(s): AMMONIA in the last 168 hours. Coagulation profile  No results for input(s): INR, PROTIME in the last 168 hours. COVID-19 Labs  No results for input(s): DDIMER, FERRITIN, LDH, CRP in the last 72 hours.  Lab Results  Component Value Date   SARSCOV2NAA NEGATIVE 02/10/2024   SARSCOV2NAA NEGATIVE 04/17/2023   SARSCOV2NAA NEGATIVE 07/08/2022   SARSCOV2NAA NEGATIVE 04/15/2022    CBC: Recent Labs  Lab 02/10/24 1215 02/10/24 1847  WBC 9.1 10.7*  HGB 15.5* 13.8  HCT 45.5 39.8  MCV 92.9 92.1  PLT 260 228   Cardiac Enzymes: No results for input(s): CKTOTAL, CKMB, CKMBINDEX, TROPONINI in the last 168 hours. BNP (last 3 results) No results for input(s): PROBNP in the last 8760 hours. CBG: Recent Labs  Lab 02/12/24 0747  GLUCAP 99   D-Dimer: No results for  input(s): DDIMER in the last 72 hours. Hgb A1c: No results for input(s): HGBA1C in the last 72 hours. Lipid Profile: No results for input(s): CHOL, HDL, LDLCALC, TRIG, CHOLHDL, LDLDIRECT in the last 72 hours. Thyroid  function studies: No results for input(s): TSH, T4TOTAL, T3FREE, THYROIDAB in the last 72 hours.  Invalid input(s): FREET3 Anemia work up: No results for input(s): VITAMINB12, FOLATE, FERRITIN, TIBC, IRON, RETICCTPCT in the last 72 hours. Sepsis Labs: Recent Labs  Lab 02/10/24 1215 02/10/24 1847  WBC 9.1 10.7*   Microbiology Recent Results (from the past 240 hours)  Resp panel by RT-PCR (RSV, Flu A&B, Covid) Anterior Nasal Swab     Status: None   Collection Time: 02/10/24  2:34 PM   Specimen: Anterior Nasal Swab  Result Value Ref Range Status   SARS Coronavirus 2 by RT PCR NEGATIVE NEGATIVE Final   Influenza A by PCR NEGATIVE NEGATIVE Final   Influenza B by PCR NEGATIVE NEGATIVE Final    Comment: (NOTE) The Xpert Xpress SARS-CoV-2/FLU/RSV plus assay is intended as an aid in the diagnosis of influenza from Nasopharyngeal swab specimens and should not be used as a sole basis for treatment. Nasal washings and aspirates are unacceptable for Xpert Xpress SARS-CoV-2/FLU/RSV testing.  Fact Sheet for Patients: BloggerCourse.com  Fact Sheet for Healthcare Providers: SeriousBroker.it  This test is not yet approved or cleared by the [place]  FDA and has been authorized for detection and/or diagnosis of SARS-CoV-2 by FDA under an Emergency Use Authorization (EUA). This EUA will remain in effect (meaning this test can be used) for the duration of the COVID-19 declaration under Section 564(b)(1) of the Act, 21 U.S.C. section 360bbb-3(b)(1), unless the authorization is terminated or revoked.     Resp Syncytial Virus by PCR NEGATIVE NEGATIVE Final    Comment: (NOTE) Fact Sheet  for Patients: BloggerCourse.com  Fact Sheet for Healthcare Providers: SeriousBroker.it  This test is not yet approved or cleared by the [place]  FDA and has been authorized for detection and/or diagnosis of SARS-CoV-2 by FDA under an Emergency Use Authorization (EUA). This EUA will remain in effect (meaning this test can be used) for the duration of the COVID-19 declaration under Section 564(b)(1) of the Act, 21 U.S.C. section 360bbb-3(b)(1), unless the authorization is terminated or revoked.  Performed at [facility], [address] [place], [place] [address]      Medications:    amLODipine   5 mg Oral Daily   aspirin  EC  81 mg Oral Daily   carvedilol   6.25 mg Oral BID   enoxaparin  (LOVENOX ) injection  40 mg Subcutaneous Q24H   pantoprazole  (PROTONIX ) IV  40 mg Intravenous Q12H   potassium chloride   40 mEq Oral BID  rosuvastatin   10 mg Oral QHS   Continuous Infusions:  cefTRIAXone  (ROCEPHIN )  IV 2 g (02/11/24 1819)   metronidazole  500 mg (02/11/24 1816)      LOS: 2 days   Erle Odell Castor  [facility]  02/12/2024, 8:32 AM

## 2024-02-13 DIAGNOSIS — E876 Hypokalemia: Secondary | ICD-10-CM | POA: Diagnosis not present

## 2024-02-13 LAB — BASIC METABOLIC PANEL WITH GFR
Anion gap: 14 (ref 5–15)
BUN: <5 mg/dL — ABNORMAL LOW (ref 6–20)
CO2: 22 mmol/L (ref 22–32)
Calcium: 8.8 mg/dL — ABNORMAL LOW (ref 8.9–10.3)
Chloride: 102 mmol/L (ref 98–111)
Creatinine, Ser: 0.79 mg/dL (ref 0.44–1.00)
GFR, Estimated: >60 mL/min (ref >60)
Glucose, Bld: 107 mg/dL — ABNORMAL HIGH (ref 70–99)
Potassium: 3.1 mmol/L — ABNORMAL LOW (ref 3.5–5.1)
Sodium: 138 mmol/L (ref 135–145)

## 2024-02-13 MED ORDER — POTASSIUM CHLORIDE CRYS ER 20 MEQ PO TBCR
40.0000 meq | EXTENDED_RELEASE_TABLET | Freq: Once | ORAL | Status: AC
  Administered 2024-02-13: 40 meq via ORAL
  Filled 2024-02-13: qty 2

## 2024-02-13 MED ORDER — AMOXICILLIN-POT CLAVULANATE 875-125 MG PO TABS: 1.0000 | ORAL_TABLET | Freq: Two times a day (BID) | ORAL | 0 refills | Status: AC

## 2024-02-13 MED ORDER — PANTOPRAZOLE SODIUM 40 MG PO TBEC: 40.0000 mg | DELAYED_RELEASE_TABLET | Freq: Every day | ORAL | 1 refills | Status: DC

## 2024-02-13 MED ORDER — AMOXICILLIN-POT CLAVULANATE 875-125 MG PO TABS
1.0000 | ORAL_TABLET | Freq: Two times a day (BID) | ORAL | Status: DC
  Administered 2024-02-13: 1 via ORAL
  Filled 2024-02-13: qty 1

## 2024-02-13 NOTE — Progress Notes (Signed)
 [facility] PROGRESS NOTE    Progress Note  Rachael Stone  FMW:[id number] DOB: 24-Nov-1965 DOA: 02/10/2024 PCP: Catalina Bare, MD     Brief Narrative:   Rachael Stone is an 58 y.o. 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 underwent bronchoscopy in October 2023 and radiation, felt not to be surgical candidate, a recent scan in January 2025 showed postradiation fibrosis of 2.1 cm, repeated CT scan showed posterior left upper lobe unchanged measuring 1.3 cm since 2017, aortic valve replacement on 07/09/2022, dilated nonischemic cardiomyopathy, dysfunctional uterine bleed, alcohol abuse, tobacco abuse and hypertensive heart disease comes in with abdominal pain nausea vomiting and diarrhea yesterday or 2 days prior to admission    Assessment/Plan:   Hypokalemia She when she took her potassium yesterday, labs are pending this morning if potassium improved can be discharged home. Patient is very insistent on going home.  Intractable nausea and vomiting possibly due to viral gastroenteritis Tolerating her diet transition antibiotics to orals. Continue Protonix  twice a day.  Prolonged QTc: Try to keep potassium greater than 4 magnesium  greater than 2. Basic metabolic panels pending this morning potassium was repleted yesterday.  Hypertensive heart disease/Chronic diastolic dysfunction: She started back on her Norvasc , Coreg  and her blood pressure is well-controlled.  Nonrheumatic aortic valve insufficiency: Status post aortic valve replacement. Currently stable.  Missing teeth: Noted.  TOBACCO DEPENDENCE Noted.  Alcohol abuse: Continue to monitor.   DVT prophylaxis: lovenox  Family Communication:none Status is: Inpatient Remains inpatient appropriate because: Hypokalemia and diarrhea    Code Status:     Code Status Orders  (From admission, onward)           Start     Ordered    02/10/24 1709  Full code  Continuous       Question:  By:  Answer:  Consent: discussion documented in EHR   02/10/24 1714           Code Status History     Date Active Date Inactive Code Status Order ID Comments User Context   04/17/2023 1407 04/18/2023 1616 Full Code [id number]  Celinda Alm Lot, MD ED   07/09/2022 1213 07/17/2022 1736 Full Code [id number]  Ricke Con BROCKS, PA-C Inpatient   05/12/2022 1002 05/12/2022 1731 Full Code [id number]  Wonda Sharper, MD Inpatient   04/08/2016 0031 04/11/2016 1457 Full Code [id number]  Franky Redia SAILOR, MD Inpatient         IV Access:   Peripheral IV   Procedures and diagnostic studies:   No results found.    Medical Consultants:   None.   Subjective:    Rachael Stone no further vomiting nausea improved.  Objective:    Vitals:   02/12/24 1351 02/12/24 2004 02/13/24 0532 02/13/24 0724  BP: (!) 125/95 96/72 123/89 122/84  Pulse: 71 80 78 64  Resp: 18     Temp: 98 F (36.7 C) 98.2 F (36.8 C) 98.2 F (36.8 C) 97.7 F (36.5 C)  TempSrc: Oral Oral Oral Oral  SpO2: 100% 100% 100% 100%  Weight:      Height:       SpO2: 100 %   Intake/Output Summary (Last 24 hours) at 02/13/2024 0900 Last data filed at 02/12/2024 1709 Gross per 24 hour  Intake 194.33 ml  Output --  Net 194.33 ml   Filed Weights   02/10/24 1137 02/10/24 1843  Weight: 67.1 kg 67 kg    Exam: General  exam: In no acute distress. Respiratory system: Good air movement and clear to auscultation. Cardiovascular system: S1 & S2 heard, RRR. No JVD. Gastrointestinal system: Abdomen is nondistended, soft and nontender.  Extremities: No pedal edema. Skin: No rashes, lesions or ulcers Psychiatry: Judgement and insight appear normal. Mood & affect appropriate.   Data Reviewed:    Labs: Basic Metabolic Panel: Recent Labs  Lab 02/10/24 1215 02/10/24 1520 02/10/24 1847 02/11/24 0629 02/12/24 0926  NA 137  --   --  139 138  K 2.1*  --    --  2.8* 2.5*  CL 95*  --   --  104 98  CO2 28  --   --  24 28  GLUCOSE 134*  --   --  113* 97  BUN 5*  --   --  <5* <5*  CREATININE 0.64  --  0.59 0.86 0.77  CALCIUM  9.6  --   --  8.6* 8.5*  MG  --  2.0  --   --   --    GFR Estimated Creatinine Clearance: 73.5 mL/min (by C-G formula based on SCr of 0.77 mg/dL). Liver Function Tests: Recent Labs  Lab 02/10/24 1215 02/11/24 0629  AST 46* 53*  ALT 29 25  ALKPHOS 64 52  BILITOT 0.9 0.5  PROT 8.2* 6.8  ALBUMIN  3.9 3.3*   Recent Labs  Lab 02/10/24 1215  LIPASE 25   No results for input(s): AMMONIA in the last 168 hours. Coagulation profile No results for input(s): INR, PROTIME in the last 168 hours. COVID-19 Labs  No results for input(s): DDIMER, FERRITIN, LDH, CRP in the last 72 hours.  Lab Results  Component Value Date   SARSCOV2NAA NEGATIVE 02/10/2024   SARSCOV2NAA NEGATIVE 04/17/2023   SARSCOV2NAA NEGATIVE 07/08/2022   SARSCOV2NAA NEGATIVE 04/15/2022    CBC: Recent Labs  Lab 02/10/24 1215 02/10/24 1847  WBC 9.1 10.7*  HGB 15.5* 13.8  HCT 45.5 39.8  MCV 92.9 92.1  PLT 260 228   Cardiac Enzymes: No results for input(s): CKTOTAL, CKMB, CKMBINDEX, TROPONINI in the last 168 hours. BNP (last 3 results) No results for input(s): PROBNP in the last 8760 hours. CBG: Recent Labs  Lab 02/12/24 0747  GLUCAP 99   D-Dimer: No results for input(s): DDIMER in the last 72 hours. Hgb A1c: No results for input(s): HGBA1C in the last 72 hours. Lipid Profile: No results for input(s): CHOL, HDL, LDLCALC, TRIG, CHOLHDL, LDLDIRECT in the last 72 hours. Thyroid  function studies: No results for input(s): TSH, T4TOTAL, T3FREE, THYROIDAB in the last 72 hours.  Invalid input(s): FREET3 Anemia work up: No results for input(s): VITAMINB12, FOLATE, FERRITIN, TIBC, IRON, RETICCTPCT in the last 72 hours. Sepsis Labs: Recent Labs  Lab 02/10/24 1215 02/10/24 1847   WBC 9.1 10.7*   Microbiology Recent Results (from the past 240 hours)  Resp panel by RT-PCR (RSV, Flu A&B, Covid) Anterior Nasal Swab     Status: None   Collection Time: 02/10/24  2:34 PM   Specimen: Anterior Nasal Swab  Result Value Ref Range Status   SARS Coronavirus 2 by RT PCR NEGATIVE NEGATIVE Final   Influenza A by PCR NEGATIVE NEGATIVE Final   Influenza B by PCR NEGATIVE NEGATIVE Final    Comment: (NOTE) The Xpert Xpress SARS-CoV-2/FLU/RSV plus assay is intended as an aid in the diagnosis of influenza from Nasopharyngeal swab specimens and should not be used as a sole basis for treatment. Nasal washings and aspirates are unacceptable for Xpert Xpress  SARS-CoV-2/FLU/RSV testing.  Fact Sheet for Patients: BloggerCourse.com  Fact Sheet for Healthcare Providers: SeriousBroker.it  This test is not yet approved or cleared by the [place]  FDA and has been authorized for detection and/or diagnosis of SARS-CoV-2 by FDA under an Emergency Use Authorization (EUA). This EUA will remain in effect (meaning this test can be used) for the duration of the COVID-19 declaration under Section 564(b)(1) of the Act, 21 U.S.C. section 360bbb-3(b)(1), unless the authorization is terminated or revoked.     Resp Syncytial Virus by PCR NEGATIVE NEGATIVE Final    Comment: (NOTE) Fact Sheet for Patients: BloggerCourse.com  Fact Sheet for Healthcare Providers: SeriousBroker.it  This test is not yet approved or cleared by the [place]  FDA and has been authorized for detection and/or diagnosis of SARS-CoV-2 by FDA under an Emergency Use Authorization (EUA). This EUA will remain in effect (meaning this test can be used) for the duration of the COVID-19 declaration under Section 564(b)(1) of the Act, 21 U.S.C. section 360bbb-3(b)(1), unless the authorization is terminated  or revoked.  Performed at [facility], [address] [place], [place] [address]      Medications:    amLODipine   5 mg Oral Daily   aspirin  EC  81 mg Oral Daily   carvedilol   6.25 mg Oral BID   enoxaparin  (LOVENOX ) injection  40 mg Subcutaneous Q24H   pantoprazole  (PROTONIX ) IV  40 mg Intravenous Q12H   potassium chloride   40 mEq Oral BID   potassium chloride   40 mEq Oral BID   rosuvastatin   10 mg Oral QHS   Continuous Infusions:  cefTRIAXone  (ROCEPHIN )  IV 2 g (02/12/24 1816)   metronidazole  500 mg (02/12/24 1817)      LOS: 3 days   Erle Odell Castor  [facility]  02/13/2024, 9:00 AM

## 2024-02-13 NOTE — Progress Notes (Signed)
 Patient discharged to home, AVS reviewed. Family to provide transportation.

## 2024-02-13 NOTE — Discharge Summary (Signed)
 Physician Discharge Summary  Rachael Stone FMW:[id number] DOB: 1965-10-25 DOA: 02/10/2024  PCP: Catalina Bare, MD  Admit date: 02/10/2024 Discharge date: 02/13/2024  Admitted From: Home  Disposition:  Home   Recommendations for Outpatient Follow-up:  Follow up with PCP in 1-2 weeks Please obtain BMP/CBC in one week   Home Health:No  Equipment/Devices:None   Discharge Condition:Stable  CODE STATUS:Full  Diet recommendation: Heart Healthy   Brief/Interim Summary: 58 y.o. 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 underwent bronchoscopy in October 2023 and radiation, felt not to be surgical candidate, a recent scan in January 2025 showed postradiation fibrosis of 2.1 cm, repeated CT scan showed posterior left upper lobe unchanged measuring 1.3 cm since 2017, aortic valve replacement on 07/09/2022, dilated nonischemic cardiomyopathy, dysfunctional uterine bleed, alcohol abuse, tobacco abuse and hypertensive heart disease comes in with abdominal pain nausea vomiting and diarrhea yesterday or 2 days prior to admission   Discharge Diagnoses:  Principal Problem:   Hypokalemia Active Problems:   TOBACCO DEPENDENCE   Hypertensive heart disease with heart failure (HCC)   Nonrheumatic aortic valve insufficiency   Moderate to Severe Mitral Regurgitation   Essential hypertension   Teeth missing   Chronic periodontitis   Viral gastroenteritis  Hypokalemia: She relates she was not taking her potassium at home she had some type of viral gastroenteritis with nausea vomiting and diarrhea. Her potassium was repleted and improved. She will continue to take her potassium as she has at home.  She reassured me that she had potassium pills at home she was is not taking them.  Intractable nausea vomiting possibly due to viral gastroenteritis: Her diarrhea resolved she was started on Protonix  which should continue twice a  day. She was switched to oral antibiotics she will finish 2 additional days and outpatient.  Prolonged QTc: Try to keep potassium greater 4 magnesium  greater than 2.  Hypertensive heart disease/chronic diastolic dysfunction: No changes made to her medication.  Nonrheumatic aortic valvular insufficiency: With a history of aortic valve replacement, currently stable.  Missing teeth/poor oral hygiene: Noted.  Tobacco dependence: Noted.  Alcohol abuse: No signs of withdrawal.  Discharge Instructions  Discharge Instructions     Diet - low sodium heart healthy   Complete by: As directed    Increase activity slowly   Complete by: As directed       Allergies as of 02/13/2024       Reactions   Ibuprofen  Other (See Comments)   Irritates stomach        Medication List     TAKE these medications    acetaminophen  500 MG tablet Commonly known as: TYLENOL  Take 500 mg by mouth every 8 (eight) hours as needed for moderate pain.   albuterol  108 (90 Base) MCG/ACT inhaler Commonly known as: VENTOLIN  HFA Inhale 1-2 puffs into the lungs every 4 (four) hours as needed for shortness of breath or wheezing.   amLODipine  5 MG tablet Commonly known as: NORVASC  Take 2.5 mg by mouth daily.   amoxicillin -clavulanate 875-125 MG tablet Commonly known as: AUGMENTIN  Take 1 tablet by mouth every 12 (twelve) hours for 3 days.   aspirin  EC 81 MG tablet Take 81 mg by mouth 2 (two) times a week. Swallow whole.   carvedilol  6.25 MG tablet Commonly known as: COREG  Take 6.25 mg by mouth 2 (two) times daily.   pantoprazole  40 MG tablet Commonly known as: Protonix  Take 1 tablet (40 mg total) by mouth  daily.   potassium chloride  SA 20 MEQ tablet Commonly known as: KLOR-CON  M Take 20 mEq by mouth daily.   rosuvastatin  10 MG tablet Commonly known as: CRESTOR  Take 1 tablet (10 mg total) by mouth at bedtime.        Follow-up Information     Catalina Bare, MD Follow up.    Specialty: Internal Medicine Contact information: [address] [place] [address] [phone]                Allergies  Allergen Reactions   Ibuprofen  Other (See Comments)    Irritates stomach    Consultations: None   Procedures/Studies: CT ABDOMEN PELVIS W CONTRAST Result Date: 02/10/2024 CLINICAL DATA:  Abdominal pain. EXAM: CT ABDOMEN AND PELVIS WITH CONTRAST TECHNIQUE: Multidetector CT imaging of the abdomen and pelvis was performed using the standard protocol following bolus administration of intravenous contrast. RADIATION DOSE REDUCTION: This exam was performed according to the departmental dose-optimization program which includes automated exposure control, adjustment of the mA and/or kV according to patient size and/or use of iterative reconstruction technique. CONTRAST:  60mL OMNIPAQUE  IOHEXOL  350 MG/ML SOLN COMPARISON:  CT abdomen pelvis dated 03/18/2020. FINDINGS: Lower chest: Emphysema. No intra-abdominal free air or free fluid. Hepatobiliary: Fatty liver. No biliary dilatation. The gallbladder is unremarkable. Pancreas: Unremarkable. No pancreatic ductal dilatation or surrounding inflammatory changes. Spleen: Normal in size without focal abnormality. Adrenals/Urinary Tract: The adrenal glands unremarkable small left renal inferior pole cyst. There is no hydronephrosis on either side. There is symmetric enhancement and excretion of contrast by both kidneys. The visualized ureters and urinary bladder appear unremarkable. Stomach/Bowel: Mild thickened appearance of the distal colon likely related to underdistention. Colitis is less likely. Clinical correlation is recommended. There is no bowel obstruction. The appendix is normal. Vascular/Lymphatic: Moderate aortoiliac atherosclerotic disease. The IVC is unremarkable. No portal venous gas. There is no adenopathy. Reproductive: The uterus is grossly unremarkable. No suspicious adnexal masses. Other: None  Musculoskeletal: No acute osseous pathology.  Degenerative changes IMPRESSION: 1. Underdistention of the distal colon versus less likely mild colitis. Clinical correlation is recommended. No bowel obstruction. Normal appendix. 2. Fatty liver. 3. Aortic Atherosclerosis (ICD10-I70.0) and Emphysema (ICD10-J43.9). Electronically Signed   By: Vanetta Chou M.D.   On: 02/10/2024 16:07   DG Chest 2 View Result Date: 02/10/2024 CLINICAL DATA:  Cough.  Chills.  Diarrhea. EXAM: CHEST - 2 VIEW COMPARISON:  04/17/2023. FINDINGS: Bilateral lung fields are clear. Bilateral costophrenic angles are clear. Normal cardio-mediastinal silhouette. Sternotomy wires noted.  There is prosthetic aortic valve. No acute osseous abnormalities. The soft tissues are within normal limits. IMPRESSION: No active cardiopulmonary disease. Electronically Signed   By: Ree Molt M.D.   On: 02/10/2024 13:52   CT CHEST W CONTRAST Result Date: 02/03/2024 CLINICAL DATA:  Non-small cell lung cancer restaging, status post SBRT * Tracking Code: BO * EXAM: CT CHEST WITH CONTRAST TECHNIQUE: Multidetector CT imaging of the chest was performed during intravenous contrast administration. RADIATION DOSE REDUCTION: This exam was performed according to the departmental dose-optimization program which includes automated exposure control, adjustment of the mA and/or kV according to patient size and/or use of iterative reconstruction technique. CONTRAST:  75mL OMNIPAQUE  IOHEXOL  300 MG/ML  SOLN COMPARISON:  07/23/2023 FINDINGS: Cardiovascular: Aortic atherosclerosis. Aortic valve prosthesis. Normal heart size. Three-vessel coronary artery calcifications. No pericardial effusion. Mediastinum/Nodes: No enlarged mediastinal, hilar, or axillary lymph nodes. Thyroid  gland, trachea, and esophagus demonstrate no significant findings. Lungs/Pleura: Moderate centrilobular and paraseptal emphysema.  Unchanged lobulated nodule of the posterior left upper lobe measuring  1.3 x 0.6 cm (series 7, image 29). New nodule in the superior segment right lower lobe measuring 0.4 cm (series 7, image 52). Interval increase in size and conspicuity of spiculated nodular opacity in the lingula, on today's examination measuring 2.2 x 1.8 cm, previously 1.2 x 1.1 cm when measured similarly (series 7, image 90). No pleural effusion or pneumothorax. Upper Abdomen: No acute abnormality.  Hepatic steatosis. Musculoskeletal: No chest wall abnormality. No acute osseous findings. IMPRESSION: 1. Interval increase in size and conspicuity of spiculated nodular opacity in the lingula, on today's examination measuring 2.2 x 1.8 cm, previously 1.2 x 1.1 cm when measured similarly. This could reflect evolution of radiation pneumonitis/fibrosis but is worrisome for residual or recurrent malignancy. Consider PET-CT for metabolic characterization. 2. New nodule in the superior segment right lower lobe measuring 0.4 cm, nonspecific although given findings above worrisome for a small pulmonary metastasis. 3. Unchanged lobulated nodule of the posterior left upper lobe measuring 1.3 x 0.6 cm, generally more benign in appearance and unchanged compared to prior examinations. 4. Emphysema. 5. Coronary artery disease. 6. Hepatic steatosis. Aortic Atherosclerosis (ICD10-I70.0) and Emphysema (ICD10-J43.9). Electronically Signed   By: Marolyn JONETTA Jaksch M.D.   On: 02/03/2024 06:42   (Echo, Carotid, EGD, Colonoscopy, ERCP)    Subjective: No complains  Discharge Exam: Vitals:   02/13/24 0532 02/13/24 0724  BP: 123/89 122/84  Pulse: 78 64  Resp:    Temp: 98.2 F (36.8 C) 97.7 F (36.5 C)  SpO2: 100% 100%   Vitals:   02/12/24 1351 02/12/24 2004 02/13/24 0532 02/13/24 0724  BP: (!) 125/95 96/72 123/89 122/84  Pulse: 71 80 78 64  Resp: 18     Temp: 98 F (36.7 C) 98.2 F (36.8 C) 98.2 F (36.8 C) 97.7 F (36.5 C)  TempSrc: Oral Oral Oral Oral  SpO2: 100% 100% 100% 100%  Weight:      Height:         General: Pt is alert, awake, not in acute distress Cardiovascular: RRR, S1/S2 +, no rubs, no gallops Respiratory: CTA bilaterally, no wheezing, no rhonchi Abdominal: Soft, NT, ND, bowel sounds + Extremities: no edema, no cyanosis    The results of significant diagnostics from this hospitalization (including imaging, microbiology, ancillary and laboratory) are listed below for reference.     Microbiology: Recent Results (from the past 240 hours)  Resp panel by RT-PCR (RSV, Flu A&B, Covid) Anterior Nasal Swab     Status: None   Collection Time: 02/10/24  2:34 PM   Specimen: Anterior Nasal Swab  Result Value Ref Range Status   SARS Coronavirus 2 by RT PCR NEGATIVE NEGATIVE Final   Influenza A by PCR NEGATIVE NEGATIVE Final   Influenza B by PCR NEGATIVE NEGATIVE Final    Comment: (NOTE) The Xpert Xpress SARS-CoV-2/FLU/RSV plus assay is intended as an aid in the diagnosis of influenza from Nasopharyngeal swab specimens and should not be used as a sole basis for treatment. Nasal washings and aspirates are unacceptable for Xpert Xpress SARS-CoV-2/FLU/RSV testing.  Fact Sheet for Patients: BloggerCourse.com  Fact Sheet for Healthcare Providers: SeriousBroker.it  This test is not yet approved or cleared by the [place]  FDA and has been authorized for detection and/or diagnosis of SARS-CoV-2 by FDA under an Emergency Use Authorization (EUA). This EUA will remain in effect (meaning this test can be used) for the duration of the COVID-19 declaration under Section 564(b)(1) of the  Act, 21 U.S.C. section 360bbb-3(b)(1), unless the authorization is terminated or revoked.     Resp Syncytial Virus by PCR NEGATIVE NEGATIVE Final    Comment: (NOTE) Fact Sheet for Patients: BloggerCourse.com  Fact Sheet for Healthcare Providers: SeriousBroker.it  This test is not yet approved or  cleared by the [place]  FDA and has been authorized for detection and/or diagnosis of SARS-CoV-2 by FDA under an Emergency Use Authorization (EUA). This EUA will remain in effect (meaning this test can be used) for the duration of the COVID-19 declaration under Section 564(b)(1) of the Act, 21 U.S.C. section 360bbb-3(b)(1), unless the authorization is terminated or revoked.  Performed at [facility], [address] [place], [place] [address]      Labs: BNP (last 3 results) Recent Labs    04/17/23 0845  BNP 24.0   Basic Metabolic Panel: Recent Labs  Lab 02/10/24 1215 02/10/24 1520 02/10/24 1847 02/11/24 0629 02/12/24 0926 02/13/24 1055  NA 137  --   --  139 138 138  K 2.1*  --   --  2.8* 2.5* 3.1*  CL 95*  --   --  104 98 102  CO2 28  --   --  24 28 22   GLUCOSE 134*  --   --  113* 97 107*  BUN 5*  --   --  <5* <5* <5*  CREATININE 0.64  --  0.59 0.86 0.77 0.79  CALCIUM  9.6  --   --  8.6* 8.5* 8.8*  MG  --  2.0  --   --   --   --    Liver Function Tests: Recent Labs  Lab 02/10/24 1215 02/11/24 0629  AST 46* 53*  ALT 29 25  ALKPHOS 64 52  BILITOT 0.9 0.5  PROT 8.2* 6.8  ALBUMIN  3.9 3.3*   Recent Labs  Lab 02/10/24 1215  LIPASE 25   No results for input(s): AMMONIA in the last 168 hours. CBC: Recent Labs  Lab 02/10/24 1215 02/10/24 1847  WBC 9.1 10.7*  HGB 15.5* 13.8  HCT 45.5 39.8  MCV 92.9 92.1  PLT 260 228   Cardiac Enzymes: No results for input(s): CKTOTAL, CKMB, CKMBINDEX, TROPONINI in the last 168 hours. BNP: Invalid input(s): POCBNP CBG: Recent Labs  Lab 02/12/24 0747  GLUCAP 99   D-Dimer No results for input(s): DDIMER in the last 72 hours. Hgb A1c No results for input(s): HGBA1C in the last 72 hours. Lipid Profile No results for input(s): CHOL, HDL, LDLCALC, TRIG, CHOLHDL, LDLDIRECT in the last 72 hours. Thyroid  function studies No results for input(s): TSH, T4TOTAL, T3FREE,  THYROIDAB in the last 72 hours.  Invalid input(s): FREET3 Anemia work up No results for input(s): VITAMINB12, FOLATE, FERRITIN, TIBC, IRON, RETICCTPCT in the last 72 hours. Urinalysis    Component Value Date/Time   COLORURINE YELLOW 02/10/2024 1140   APPEARANCEUR HAZY (A) 02/10/2024 1140   LABSPEC 1.013 02/10/2024 1140   PHURINE 6.0 02/10/2024 1140   GLUCOSEU NEGATIVE 02/10/2024 1140   HGBUR NEGATIVE 02/10/2024 1140   BILIRUBINUR NEGATIVE 02/10/2024 1140   BILIRUBINUR neg 09/15/2017 1412   KETONESUR 20 (A) 02/10/2024 1140   PROTEINUR NEGATIVE 02/10/2024 1140   UROBILINOGEN 0.2 01/10/2019 1328   NITRITE NEGATIVE 02/10/2024 1140   LEUKOCYTESUR MODERATE (A) 02/10/2024 1140   Sepsis Labs Recent Labs  Lab 02/10/24 1215 02/10/24 1847  WBC 9.1 10.7*   Microbiology Recent Results (from the past 240 hours)  Resp panel by RT-PCR (RSV, Flu A&B,  Covid) Anterior Nasal Swab     Status: None   Collection Time: 02/10/24  2:34 PM   Specimen: Anterior Nasal Swab  Result Value Ref Range Status   SARS Coronavirus 2 by RT PCR NEGATIVE NEGATIVE Final   Influenza A by PCR NEGATIVE NEGATIVE Final   Influenza B by PCR NEGATIVE NEGATIVE Final    Comment: (NOTE) The Xpert Xpress SARS-CoV-2/FLU/RSV plus assay is intended as an aid in the diagnosis of influenza from Nasopharyngeal swab specimens and should not be used as a sole basis for treatment. Nasal washings and aspirates are unacceptable for Xpert Xpress SARS-CoV-2/FLU/RSV testing.  Fact Sheet for Patients: BloggerCourse.com  Fact Sheet for Healthcare Providers: SeriousBroker.it  This test is not yet approved or cleared by the [place]  FDA and has been authorized for detection and/or diagnosis of SARS-CoV-2 by FDA under an Emergency Use Authorization (EUA). This EUA will remain in effect (meaning this test can be used) for the duration of the COVID-19 declaration  under Section 564(b)(1) of the Act, 21 U.S.C. section 360bbb-3(b)(1), unless the authorization is terminated or revoked.     Resp Syncytial Virus by PCR NEGATIVE NEGATIVE Final    Comment: (NOTE) Fact Sheet for Patients: BloggerCourse.com  Fact Sheet for Healthcare Providers: SeriousBroker.it  This test is not yet approved or cleared by the [place]  FDA and has been authorized for detection and/or diagnosis of SARS-CoV-2 by FDA under an Emergency Use Authorization (EUA). This EUA will remain in effect (meaning this test can be used) for the duration of the COVID-19 declaration under Section 564(b)(1) of the Act, 21 U.S.C. section 360bbb-3(b)(1), unless the authorization is terminated or revoked.  Performed at [facility], [address] [place], [place] [address]      Time coordinating discharge: Over 30 minutes  SIGNED:   Erle Odell Castor, MD  [facility] 02/13/2024, 12:59 PM Pager   If 7PM-7AM, please contact night-coverage [email or web address] Password [email or web address]

## 2024-05-05 ENCOUNTER — Telehealth: Payer: Self-pay | Admitting: *Deleted

## 2024-05-05 NOTE — Telephone Encounter (Signed)
 CALLED PATIENT TO INFORM OF CT FOR 05-10-24- ARRIVAL TIME- 3:45 PM @ [facility], NO RESTRICTIONS TO SCAN, PATIENT TO RECEIVE RESULTS FROM [facility] PERKINS ON 05-23-24 @ 2 PM VIA TELEPHONE, LVM FOR A RETURN CALL

## 2024-05-09 NOTE — Progress Notes (Incomplete)
 Follow up call to discuss results from chest CT on 05/10/24

## 2024-05-10 ENCOUNTER — Ambulatory Visit (HOSPITAL_COMMUNITY)
Admission: RE | Admit: 2024-05-10 | Discharge: 2024-05-10 | Disposition: A | Discharge status: Home / Self Care | Source: Ambulatory Visit | Attending: Radiation Oncology | Admitting: Radiation Oncology

## 2024-05-10 DIAGNOSIS — C3412 Malignant neoplasm of upper lobe, left bronchus or lung: Secondary | ICD-10-CM | POA: Insufficient documentation

## 2024-05-10 DIAGNOSIS — I10 Essential (primary) hypertension: Secondary | ICD-10-CM | POA: Diagnosis present

## 2024-05-10 LAB — POCT I-STAT CREATININE: Creatinine, Ser: 0.9 mg/dL (ref 0.44–1.00)

## 2024-05-10 MED ORDER — IOHEXOL 300 MG/ML  SOLN
75.0000 mL | Freq: Once | INTRAMUSCULAR | Status: AC | PRN
  Administered 2024-05-10: 75 mL via INTRAVENOUS

## 2024-05-10 MED ORDER — SODIUM CHLORIDE (PF) 0.9 % IJ SOLN
INTRAMUSCULAR | Status: AC
  Filled 2024-05-10: qty 50

## 2024-05-12 ENCOUNTER — Telehealth: Payer: Self-pay | Admitting: Radiation Oncology

## 2024-05-12 DIAGNOSIS — C3412 Malignant neoplasm of upper lobe, left bronchus or lung: Secondary | ICD-10-CM

## 2024-05-12 NOTE — Telephone Encounter (Signed)
 I called the patient to review her CT scan which is reassuring. I recommended another scan for surveillance per NCCN guidelines in 6 months time. She is in agreement with this plan.

## 2024-05-17 ENCOUNTER — Emergency Department (HOSPITAL_COMMUNITY)
Admission: EM | Admit: 2024-05-17 | Discharge: 2024-05-18 | Disposition: A | Discharge status: Home / Self Care | Attending: Emergency Medicine | Admitting: Emergency Medicine

## 2024-05-17 ENCOUNTER — Other Ambulatory Visit: Payer: Self-pay

## 2024-05-17 ENCOUNTER — Encounter (HOSPITAL_COMMUNITY): Payer: Self-pay

## 2024-05-17 DIAGNOSIS — I10 Essential (primary) hypertension: Secondary | ICD-10-CM | POA: Diagnosis not present

## 2024-05-17 DIAGNOSIS — Z7982 Long term (current) use of aspirin: Secondary | ICD-10-CM | POA: Insufficient documentation

## 2024-05-17 DIAGNOSIS — E876 Hypokalemia: Secondary | ICD-10-CM | POA: Insufficient documentation

## 2024-05-17 DIAGNOSIS — R110 Nausea: Secondary | ICD-10-CM | POA: Insufficient documentation

## 2024-05-17 DIAGNOSIS — J449 Chronic obstructive pulmonary disease, unspecified: Secondary | ICD-10-CM | POA: Insufficient documentation

## 2024-05-17 DIAGNOSIS — Z85118 Personal history of other malignant neoplasm of bronchus and lung: Secondary | ICD-10-CM | POA: Diagnosis not present

## 2024-05-17 DIAGNOSIS — Z79899 Other long term (current) drug therapy: Secondary | ICD-10-CM | POA: Diagnosis not present

## 2024-05-17 DIAGNOSIS — R7401 Elevation of levels of liver transaminase levels: Secondary | ICD-10-CM | POA: Insufficient documentation

## 2024-05-17 LAB — CBC WITH DIFFERENTIAL/PLATELET
Abs Immature Granulocytes: 0.03 K/uL (ref 0.00–0.07)
Basophils Absolute: 0 K/uL (ref 0.0–0.1)
Basophils Relative: 0 %
Eosinophils Absolute: 0 K/uL (ref 0.0–0.5)
Eosinophils Relative: 0 %
HCT: 44.2 % (ref 36.0–46.0)
Hemoglobin: 14.6 g/dL (ref 12.0–15.0)
Immature Granulocytes: 0 %
Lymphocytes Relative: 11 %
Lymphs Abs: 1.1 K/uL (ref 0.7–4.0)
MCH: 31.7 pg (ref 26.0–34.0)
MCHC: 33 g/dL (ref 30.0–36.0)
MCV: 96.1 fL (ref 80.0–100.0)
Monocytes Absolute: 0.6 K/uL (ref 0.1–1.0)
Monocytes Relative: 6 %
Neutro Abs: 8.3 K/uL — ABNORMAL HIGH (ref 1.7–7.7)
Neutrophils Relative %: 83 %
Platelets: 295 K/uL (ref 150–400)
RBC: 4.6 MIL/uL (ref 3.87–5.11)
RDW: 17 % — ABNORMAL HIGH (ref 11.5–15.5)
WBC: 10 K/uL (ref 4.0–10.5)
nRBC: 0 % (ref 0.0–0.2)

## 2024-05-17 LAB — COMPREHENSIVE METABOLIC PANEL WITH GFR
ALT: 82 U/L — ABNORMAL HIGH (ref 0–44)
AST: 115 U/L — ABNORMAL HIGH (ref 15–41)
Albumin: 4.2 g/dL (ref 3.5–5.0)
Alkaline Phosphatase: 63 U/L (ref 38–126)
Anion gap: 14 (ref 5–15)
BUN: 11 mg/dL (ref 6–20)
CO2: 26 mmol/L (ref 22–32)
Calcium: 9.9 mg/dL (ref 8.9–10.3)
Chloride: 97 mmol/L — ABNORMAL LOW (ref 98–111)
Creatinine, Ser: 0.77 mg/dL (ref 0.44–1.00)
GFR, Estimated: >60 mL/min (ref >60)
Glucose, Bld: 103 mg/dL — ABNORMAL HIGH (ref 70–99)
Potassium: 3.4 mmol/L — ABNORMAL LOW (ref 3.5–5.1)
Sodium: 137 mmol/L (ref 135–145)
Total Bilirubin: 0.8 mg/dL (ref 0.0–1.2)
Total Protein: 8 g/dL (ref 6.5–8.1)

## 2024-05-17 LAB — LIPASE, BLOOD: Lipase: 35 U/L (ref 11–51)

## 2024-05-17 MED ORDER — PANTOPRAZOLE SODIUM 40 MG PO TBEC: 40.0000 mg | DELAYED_RELEASE_TABLET | Freq: Every day | ORAL | 0 refills | Status: AC

## 2024-05-17 MED ORDER — CARVEDILOL 6.25 MG PO TABS: 6.2500 mg | ORAL_TABLET | Freq: Two times a day (BID) | ORAL | 0 refills | Status: AC

## 2024-05-17 MED ORDER — SODIUM CHLORIDE 0.9 % IV BOLUS
1000.0000 mL | Freq: Once | INTRAVENOUS | Status: AC
  Administered 2024-05-17: 1000 mL via INTRAVENOUS

## 2024-05-17 MED ORDER — AMLODIPINE BESYLATE 5 MG PO TABS
5.0000 mg | ORAL_TABLET | Freq: Once | ORAL | Status: AC
Start: 2024-05-17 — End: 2024-05-17
  Administered 2024-05-17: 5 mg via ORAL
  Filled 2024-05-17: qty 1

## 2024-05-17 MED ORDER — OXYCODONE HCL 5 MG PO TABS
5.0000 mg | ORAL_TABLET | Freq: Once | ORAL | Status: AC
  Administered 2024-05-18: 5 mg via ORAL
  Filled 2024-05-17: qty 1

## 2024-05-17 MED ORDER — AMLODIPINE BESYLATE 5 MG PO TABS: 2.5000 mg | ORAL_TABLET | Freq: Every day | ORAL | 0 refills | Status: AC

## 2024-05-17 MED ORDER — ONDANSETRON HCL 4 MG/2ML IJ SOLN
4.0000 mg | Freq: Once | INTRAMUSCULAR | Status: AC
  Administered 2024-05-17: 4 mg via INTRAVENOUS
  Filled 2024-05-17: qty 2

## 2024-05-17 MED ORDER — MORPHINE SULFATE (PF) 4 MG/ML IV SOLN
4.0000 mg | Freq: Once | INTRAVENOUS | Status: AC
  Administered 2024-05-17: 4 mg via INTRAVENOUS
  Filled 2024-05-17: qty 1

## 2024-05-17 MED ORDER — POTASSIUM CHLORIDE CRYS ER 20 MEQ PO TBCR
40.0000 meq | EXTENDED_RELEASE_TABLET | Freq: Once | ORAL | Status: AC
  Administered 2024-05-17: 40 meq via ORAL
  Filled 2024-05-17: qty 2

## 2024-05-17 NOTE — ED Notes (Signed)
Pt aware of needing a urine sample.

## 2024-05-17 NOTE — ED Triage Notes (Signed)
 Pt to er, pt states that for the past three days she has been having fever chill, vomiting and diarrhea. Pt also reports some abd pain.

## 2024-05-17 NOTE — ED Provider Notes (Signed)
 [place] [facility]ENC[facility] DEPARTMENT AT [facility] Provider Note   CSN: [id number] Arrival date & time: 05/17/24  1734     Patient presents with: Abdominal Pain   Rachael Stone is a 58 y.o. female.  {Add pertinent medical, surgical, social history, OB history to HPI:9480} 58 year old female with past medical history of cardiomyopathy, hypertension, alcohol use, marijuana use, hypertension, COPD and adenocarcinoma of the left lung presents with complaint of nausea without vomiting.  Symptoms started three days ago, associated with chills and loose stools.  Denies blood in stools, fevers, abdominal pain.  No specific known sick contacts, no recent antibiotics, no recent travel.        Prior to Admission medications   Medication Sig Start Date End Date Taking? Authorizing Provider  acetaminophen  (TYLENOL ) 500 MG tablet Take 500 mg by mouth every 8 (eight) hours as needed for moderate pain.    [provider]  albuterol  (VENTOLIN  HFA) 108 (90 Base) MCG/ACT inhaler Inhale 1-2 puffs into the lungs every 4 (four) hours as needed for shortness of breath or wheezing. 02/09/24   [provider]  amLODipine  (NORVASC ) 5 MG tablet Take 2.5 mg by mouth daily.    [provider]  aspirin  EC 81 MG tablet Take 81 mg by mouth 2 (two) times a week. Swallow whole.    [provider]  carvedilol  (COREG ) 6.25 MG tablet Take 6.25 mg by mouth 2 (two) times daily. 02/09/24   [provider]  pantoprazole  (PROTONIX ) 40 MG tablet Take 1 tablet (40 mg total) by mouth daily. 02/13/24 02/12/25  Odell Celinda Balo, MD  potassium chloride  SA (KLOR-CON  M) 20 MEQ tablet Take 20 mEq by mouth daily. 02/09/24   [provider]  rosuvastatin  (CRESTOR ) 10 MG tablet Take 1 tablet (10 mg total) by mouth at bedtime. Patient not taking: No sig reported 03/04/23   Nahser, Aleene PARAS, MD    Allergies: Naproxen  and Ibuprofen     Review of Systems Negative except as per  HPI Updated Vital Signs BP (!) 184/116   Pulse 78   Temp 98.4 F (36.9 C) (Oral)   Resp 18   Ht 5' 5 (1.651 m)   Wt 63.5 kg   LMP  (LMP Unknown)   SpO2 96%   BMI 23.30 kg/m   Physical Exam Vitals and nursing note reviewed.  Constitutional:      General: She is not in acute distress.    Appearance: She is well-developed. She is not diaphoretic.  HENT:     Head: Normocephalic and atraumatic.     Mouth/Throat:     Mouth: Mucous membranes are dry.  Eyes:     Conjunctiva/sclera: Conjunctivae normal.  Cardiovascular:     Rate and Rhythm: Normal rate and regular rhythm.     Pulses: Normal pulses.     Heart sounds: Normal heart sounds.  Pulmonary:     Effort: Pulmonary effort is normal.     Breath sounds: Normal breath sounds.  Abdominal:     Palpations: Abdomen is soft.     Tenderness: There is no abdominal tenderness.  Musculoskeletal:     Right lower leg: No edema.     Left lower leg: No edema.  Skin:    General: Skin is warm and dry.     Findings: No erythema or rash.  Neurological:     Mental Status: She is alert and oriented to person, place, and time.  Psychiatric:        Behavior:  Behavior normal.     (all labs ordered are listed, but only abnormal results are displayed) Labs Reviewed  CBC WITH DIFFERENTIAL/PLATELET - Abnormal; Notable for the following components:      Result Value   RDW 17.0 (*)    Neutro Abs 8.3 (*)    All other components within normal limits  COMPREHENSIVE METABOLIC PANEL WITH GFR - Abnormal; Notable for the following components:   Potassium 3.4 (*)    Chloride 97 (*)    Glucose, Bld 103 (*)    AST 115 (*)    ALT 82 (*)    All other components within normal limits  LIPASE, BLOOD  URINALYSIS, ROUTINE W REFLEX MICROSCOPIC    EKG: None  Radiology: No results found.  {Document cardiac monitor, telemetry assessment procedure when appropriate:32947} Procedures   Medications Ordered in the ED  sodium chloride  0.9 % bolus  1,000 mL (1,000 mLs Intravenous New Bag/Given 05/17/24 2224)  ondansetron  (ZOFRAN ) injection 4 mg (4 mg Intravenous Given 05/17/24 2224)  morphine  (PF) 4 MG/ML injection 4 mg (4 mg Intravenous Given 05/17/24 2224)      {Click here for ABCD2, HEART and other calculators REFRESH Note before signing:1}                              Medical Decision Making Risk Prescription drug management.   This patient presents to the ED for concern of nausea, this involves an extensive number of treatment options, and is a complaint that carries with it a high risk of complications and morbidity.  The differential diagnosis includes ***   Co morbidities / Chronic conditions that complicate the patient evaluation  ***   Additional history obtained:  Additional history obtained from EMR External records from outside source obtained and reviewed including prior CT a/p dated 02/10/24 with hepatic steatosis, no gallstones/pancreatitis    Lab Tests:  I Ordered, and personally interpreted labs.  The pertinent results include:  ***   Imaging Studies ordered:  I ordered imaging studies including ***  I independently visualized and interpreted imaging which showed *** I agree with the radiologist interpretation   Cardiac Monitoring: / EKG:  The patient was maintained on a cardiac monitor.  I personally viewed and interpreted the cardiac monitored which showed an underlying rhythm of: ***   Problem List / ED Course / Critical interventions / Medication management  *** I ordered medication including ***   Reevaluation of the patient after these medicines showed that the patient *** I have reviewed the patients home medicines and have made adjustments as needed   Consultations Obtained:  I requested consultation with the ***,  and discussed lab and imaging findings as well as pertinent plan - they recommend: ***   Social Determinants of Health:  Has PCP   Test / Admission -  Considered:  Stable for dc   {Document critical care time when appropriate  Document review of labs and clinical decision tools ie CHADS2VASC2, etc  Document your independent review of radiology images and any outside records  Document your discussion with family members, caretakers and with consultants  Document social determinants of health affecting pt's care  Document your decision making why or why not admission, treatments were needed:32947:::1}   Final diagnoses:  None    ED Discharge Orders     None

## 2024-05-17 NOTE — Discharge Instructions (Addendum)
 Follow up with GI, call tomorrow to schedule an appointment. Slowly decrease alcohol intake. Follow up with primary care provider. If you do not have a primary care provider, please call [facility].

## 2024-05-18 ENCOUNTER — Encounter: Payer: Self-pay | Admitting: Gastroenterology

## 2024-05-23 ENCOUNTER — Ambulatory Visit: Admitting: Radiation Oncology

## 2024-07-04 ENCOUNTER — Ambulatory Visit: Admitting: Gastroenterology

## 2024-07-04 ENCOUNTER — Encounter: Payer: Self-pay | Admitting: Nurse Practitioner

## 2024-07-04 NOTE — Progress Notes (Deleted)
 SABRA

## 2024-08-29 ENCOUNTER — Encounter: Admitting: Nurse Practitioner
